# Patient Record
Sex: Male | Born: 1940
Health system: Southern US, Community
[De-identification: ages and names within clinical notes are randomized; demographics above are authoritative.]

## PROBLEM LIST (undated history)

## (undated) DIAGNOSIS — D696 Thrombocytopenia, unspecified: Secondary | ICD-10-CM

## (undated) DIAGNOSIS — D369 Benign neoplasm, unspecified site: Secondary | ICD-10-CM

## (undated) DIAGNOSIS — N183 Chronic kidney disease, stage 3 unspecified: Secondary | ICD-10-CM

## (undated) DIAGNOSIS — G629 Polyneuropathy, unspecified: Secondary | ICD-10-CM

## (undated) DIAGNOSIS — E7800 Pure hypercholesterolemia, unspecified: Secondary | ICD-10-CM

## (undated) DIAGNOSIS — G4730 Sleep apnea, unspecified: Secondary | ICD-10-CM

## (undated) DIAGNOSIS — I442 Atrioventricular block, complete: Secondary | ICD-10-CM

## (undated) DIAGNOSIS — N4340 Spermatocele of epididymis, unspecified: Secondary | ICD-10-CM

## (undated) DIAGNOSIS — M48061 Spinal stenosis, lumbar region without neurogenic claudication: Secondary | ICD-10-CM

## (undated) DIAGNOSIS — I10 Essential (primary) hypertension: Secondary | ICD-10-CM

## (undated) DIAGNOSIS — G4700 Insomnia, unspecified: Secondary | ICD-10-CM

## (undated) DIAGNOSIS — Z950 Presence of cardiac pacemaker: Secondary | ICD-10-CM

## (undated) DIAGNOSIS — I77810 Thoracic aortic ectasia: Secondary | ICD-10-CM

## (undated) DIAGNOSIS — I350 Nonrheumatic aortic (valve) stenosis: Secondary | ICD-10-CM

## (undated) DIAGNOSIS — I480 Paroxysmal atrial fibrillation: Secondary | ICD-10-CM

## (undated) DIAGNOSIS — E119 Type 2 diabetes mellitus without complications: Secondary | ICD-10-CM

## (undated) DIAGNOSIS — I5032 Chronic diastolic (congestive) heart failure: Secondary | ICD-10-CM

## (undated) DIAGNOSIS — I2510 Atherosclerotic heart disease of native coronary artery without angina pectoris: Secondary | ICD-10-CM

## (undated) DIAGNOSIS — G2581 Restless legs syndrome: Secondary | ICD-10-CM

## (undated) HISTORY — PX: CORONARY STENT PLACEMENT: SHX1402

## (undated) HISTORY — DX: Atrioventricular block, complete: I44.2

## (undated) HISTORY — DX: Restless legs syndrome: G25.81

## (undated) HISTORY — DX: Insomnia, unspecified: G47.00

## (undated) HISTORY — DX: Chronic diastolic (congestive) heart failure: I50.32

## (undated) HISTORY — DX: Benign neoplasm, unspecified site: D36.9

## (undated) HISTORY — DX: Spermatocele of epididymis, unspecified: N43.40

## (undated) HISTORY — DX: Pure hypercholesterolemia, unspecified: E78.00

## (undated) HISTORY — PX: HERNIA REPAIR: SHX51

## (undated) HISTORY — DX: Thoracic aortic ectasia: I77.810

## (undated) HISTORY — DX: Gilbert syndrome: E80.4

## (undated) HISTORY — DX: Chronic kidney disease, stage 3 (moderate): N18.3

## (undated) HISTORY — DX: Chronic kidney disease, stage 3 unspecified: N18.30

## (undated) HISTORY — DX: Sleep apnea, unspecified: G47.30

## (undated) HISTORY — DX: Thrombocytopenia, unspecified: D69.6

## (undated) HISTORY — DX: Presence of cardiac pacemaker: Z95.0

## (undated) HISTORY — DX: Type 2 diabetes mellitus without complications: E11.9

## (undated) HISTORY — DX: Nonrheumatic aortic (valve) stenosis: I35.0

## (undated) HISTORY — DX: Spinal stenosis, lumbar region without neurogenic claudication: M48.061

## (undated) HISTORY — DX: Polyneuropathy, unspecified: G62.9

## (undated) HISTORY — DX: Paroxysmal atrial fibrillation: I48.0

---
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
## 1978-05-27 HISTORY — PX: HEMORROIDECTOMY: SUR656

## 1986-05-27 HISTORY — PX: CHOLECYSTECTOMY: SHX55

## 2007-07-09 ENCOUNTER — Encounter: Admission: RE | Admit: 2007-07-09 | Discharge: 2007-07-09 | Payer: Self-pay | Admitting: Geriatric Medicine

## 2008-04-25 ENCOUNTER — Encounter: Admission: RE | Admit: 2008-04-25 | Discharge: 2008-04-25 | Payer: Self-pay | Admitting: Geriatric Medicine

## 2010-06-17 ENCOUNTER — Encounter: Payer: Self-pay | Admitting: Geriatric Medicine

## 2011-03-27 ENCOUNTER — Emergency Department (HOSPITAL_COMMUNITY): Payer: Medicare Other

## 2011-03-27 ENCOUNTER — Inpatient Hospital Stay (HOSPITAL_COMMUNITY)
Admission: EM | Admit: 2011-03-27 | Discharge: 2011-03-28 | DRG: 244 | Disposition: A | Discharge status: Home / Self Care | Payer: Medicare Other | Attending: Interventional Cardiology | Admitting: Interventional Cardiology

## 2011-03-27 DIAGNOSIS — Z79899 Other long term (current) drug therapy: Secondary | ICD-10-CM

## 2011-03-27 DIAGNOSIS — I442 Atrioventricular block, complete: Principal | ICD-10-CM | POA: Diagnosis present

## 2011-03-27 DIAGNOSIS — Z7982 Long term (current) use of aspirin: Secondary | ICD-10-CM

## 2011-03-27 DIAGNOSIS — R7989 Other specified abnormal findings of blood chemistry: Secondary | ICD-10-CM | POA: Diagnosis present

## 2011-03-27 DIAGNOSIS — I2510 Atherosclerotic heart disease of native coronary artery without angina pectoris: Secondary | ICD-10-CM | POA: Diagnosis present

## 2011-03-27 DIAGNOSIS — R0602 Shortness of breath: Secondary | ICD-10-CM

## 2011-03-27 DIAGNOSIS — R42 Dizziness and giddiness: Secondary | ICD-10-CM

## 2011-03-27 DIAGNOSIS — Z9861 Coronary angioplasty status: Secondary | ICD-10-CM

## 2011-03-27 DIAGNOSIS — K219 Gastro-esophageal reflux disease without esophagitis: Secondary | ICD-10-CM | POA: Diagnosis present

## 2011-03-27 DIAGNOSIS — G2581 Restless legs syndrome: Secondary | ICD-10-CM | POA: Diagnosis present

## 2011-03-27 DIAGNOSIS — E785 Hyperlipidemia, unspecified: Secondary | ICD-10-CM | POA: Diagnosis present

## 2011-03-27 LAB — TYPE AND SCREEN
ABO/RH(D): O POS
Antibody Screen: NEGATIVE

## 2011-03-27 LAB — DIFFERENTIAL
Eosinophils Absolute: 0.4 10*3/uL (ref 0.0–0.7)
Eosinophils Relative: 4 % (ref 0–5)
Lymphocytes Relative: 17 % (ref 12–46)
Monocytes Absolute: 0.6 10*3/uL (ref 0.1–1.0)
Neutro Abs: 7.2 10*3/uL (ref 1.7–7.7)

## 2011-03-27 LAB — COMPREHENSIVE METABOLIC PANEL
ALT: 120 U/L — ABNORMAL HIGH (ref 0–53)
Albumin: 3.2 g/dL — ABNORMAL LOW (ref 3.5–5.2)
Chloride: 102 mEq/L (ref 96–112)
Creatinine, Ser: 1.13 mg/dL (ref 0.50–1.35)
GFR calc Af Amer: 73 mL/min — ABNORMAL LOW (ref >90)
GFR calc non Af Amer: 63 mL/min — ABNORMAL LOW (ref >90)
Glucose, Bld: 173 mg/dL — ABNORMAL HIGH (ref 70–99)
Sodium: 138 mEq/L (ref 135–145)
Total Bilirubin: 1.3 mg/dL — ABNORMAL HIGH (ref 0.3–1.2)
Total Protein: 6.2 g/dL (ref 6.0–8.3)

## 2011-03-27 LAB — CK TOTAL AND CKMB (NOT AT ARMC): Relative Index: 3.1 — ABNORMAL HIGH (ref 0.0–2.5)

## 2011-03-27 LAB — MAGNESIUM: Magnesium: 2 mg/dL (ref 1.5–2.5)

## 2011-03-27 LAB — CARDIAC PANEL(CRET KIN+CKTOT+MB+TROPI)
CK, MB: 5.1 ng/mL — ABNORMAL HIGH (ref 0.3–4.0)
Relative Index: 3.8 — ABNORMAL HIGH (ref 0.0–2.5)
Total CK: 143 U/L (ref 7–232)
Total CK: 131 U/L (ref 7–232)
Troponin I: <0.3 ng/mL (ref <0.30)

## 2011-03-27 LAB — CBC
HCT: 41.5 % (ref 39.0–52.0)
WBC: 9.9 10*3/uL (ref 4.0–10.5)

## 2011-03-27 LAB — PRO B NATRIURETIC PEPTIDE: Pro B Natriuretic peptide (BNP): 1067 pg/mL — ABNORMAL HIGH (ref 0–125)

## 2011-03-27 LAB — POCT I-STAT TROPONIN I: Troponin i, poc: 0.04 ng/mL (ref 0.00–0.08)

## 2011-03-27 LAB — PHOSPHORUS: Phosphorus: 2.4 mg/dL (ref 2.3–4.6)

## 2011-03-27 LAB — PROTIME-INR: Prothrombin Time: 14.5 seconds (ref 11.6–15.2)

## 2011-03-28 ENCOUNTER — Inpatient Hospital Stay (HOSPITAL_COMMUNITY): Payer: Medicare Other

## 2011-03-28 LAB — COMPREHENSIVE METABOLIC PANEL
ALT: 119 U/L — ABNORMAL HIGH (ref 0–53)
Calcium: 9.4 mg/dL (ref 8.4–10.5)
Creatinine, Ser: 1.01 mg/dL (ref 0.50–1.35)
GFR calc Af Amer: 84 mL/min — ABNORMAL LOW (ref >90)
GFR calc non Af Amer: 72 mL/min — ABNORMAL LOW (ref >90)
Glucose, Bld: 121 mg/dL — ABNORMAL HIGH (ref 70–99)
Sodium: 140 mEq/L (ref 135–145)
Total Protein: 6.2 g/dL (ref 6.0–8.3)

## 2011-03-28 NOTE — H&P (Signed)
NAMEMARKY, BURESH NO.:  [id number]  MEDICAL RECORD NO.:  [id number]  LOCATION:  [facility]                         FACILITY:  [facility]  PHYSICIAN:  Harlon Flor, MD   DATE OF BIRTH:  [birth year]  DATE OF ADMISSION:  03/27/2011 DATE OF DISCHARGE:                             HISTORY & PHYSICAL   PRIMARY CARDIOLOGIST:  Corky Crafts, MD with [facility].  CHIEF COMPLAINT:  Shortness of breath and dizziness.  HISTORY OF PRESENT ILLNESS:  David [name] is a pleasant 70 year old male with history of remote PCI x2 to his LAD in [place] as well as restless legs syndrome who was brought into the ER via EMS after his wife called. She takes care of him in bed tonight and he began coughing and snoring loudly.  She woke him up and he was somewhat out of it for the next 5 minutes.  She then got him up and walked into the bathroom and he felt dizzy.  At some point, she checked his vital signs and noted that his heart rate was in the 20s.  EMS was called and he was brought to the emergency room, and he was found to be in complete heart block.  On further questioning, it sounds that he has had some episodes of dizziness over the last few weeks, but he has not passed out.  He has not had any chest pain and shortness of breath is resolved.  PAST MEDICAL HISTORY: 1. Coronary artery disease:  PSI x2 in the past in [place], at least 1     stent in his LAD. 2. Restless legs syndrome. 3. Hyperlipidemia. 4. GERD.  ALLERGIES:  RESTORIL and BENADRYL cause an adverse reaction, but an allergy.  HOME MEDICATIONS: 1. Niaspan 2000 mg at bedtime. 2. Metoprolol 50 mg b.i.d. 3. Simvastatin 10 mg daily. 4. Pramipexole 2 mg at bedtime. 5. Famotidine 10 mg daily.  FAMILY HISTORY:  His father had an MI at 51.  SOCIAL HISTORY:  He lives at home with his wife.  He does not smoke tobacco or use alcohol.  REVIEW OF SYSTEMS:  Full review of systems is negative except as stated in  HPI.  PHYSICAL EXAMINATION:  VITAL SIGNS:  Blood pressure 92/55, heart rate 27, respirations 16, and temperature afebrile. GENERAL:  No acute distress. HEENT:  Extraocular movements intact.  Oropharynx benign.  Nonicteric sclerae. NECK:  Supple. CARDIOVASCULAR:  Bradycardic.  No murmurs.  Jugular venous pressure normal. LUNGS:  Basilar crackles bilaterally. ABDOMEN:  Soft, nontender, and nondistended. EXTREMITIES:  No clubbing, cyanosis, or edema. NEURO:  He moves all extremities well.  Follows commands.  Alert and oriented x3. SKIN:  No rashes. LYMPH:  No lymphadenopathy.  EKG shows normal sinus rhythm with complete heart block and the ventricular escape in the 20s.  All of his labs are pending.  Chest x- ray is pending.  ASSESSMENT AND PLAN:  David [name] is a pleasant 70 year old gentleman with history of coronary artery disease who presents to the emergency room with complete heart block with various flow ventricular escape.  His current status is guarded.  He will be going for a temporary pacemaker emergently in a cath lab right  now, after which plans will be made for permanent pacemaker placement.  In the meantime, we will hold his metoprolol and keep him n.p.o.  Basic labs have been ordered, they are currently pending.     Harlon Flor, MD     MMB/MEDQ  D:  03/27/2011  T:  03/27/2011  Job:  [id number]  Electronically Signed by Meridee Score MD on 03/28/2011 10:05:36 PM

## 2011-03-29 NOTE — Discharge Summary (Signed)
  David [name]              ACCOUNT NO.:  [id number]  MEDICAL RECORD NO.:  [id number]  LOCATION:  2908                         FACILITY:  [facility]  PHYSICIAN:  Corky Crafts, MDDATE OF BIRTH:  [birth year]  DATE OF ADMISSION:  03/27/2011 DATE OF DISCHARGE:  03/28/2011                              DISCHARGE SUMMARY   FINAL DIAGNOSES: 1. Complete heart block. 2. Coronary artery disease. 3. Elevated liver function tests.  PROCEDURES PERFORMED: 1. Transvenous temporary pacemaker performed by Dr. Verdis Prime on the     morning of March 27, 2011. 2. Permanent transvenous pacemaker implanted by Dr. Berton Mount on     March 27, 2011, this is a Engineer, water. Jude, Paramedic DR RF T210     pacemaker. 3. Echocardiogram showing normal left ventricular function, done on     March 27, 2011.  HOSPITAL COURSE:  The patient was admitted because of severe shortness of breath.  He was found to be in complete heart block with heart rate of less than 30.  He underwent emergent temporary pacemaker placement. This stabilized his blood pressure and symptoms.  He then had an echocardiogram showing normal LV function.  He ruled out for MI.  He had a permanent pacer placed by Dr. Graciela Husbands.  It was also noted that his liver function tests were somewhat elevated.  It was unclear what the cause of this was.  He is on simvastatin given his coronary artery disease.  He did also have a very slow heart rate, which could have resulted in passive congestion of his liver.  Recheck was pending at the time of discharge.  We will likely recheck his LFTs in the next 4 weeks or so. We will stop his simvastatin at this time as a precaution.  DISCHARGE MEDICATIONS: 1. Aspirin 325 mg daily. 2. Metoprolol 50 mg p.o. b.i.d. 3. Mirapex 2 mg at bedtime. 4. Niaspan 2 g daily. 5. Sublingual nitroglycerin 0.4 mg p.r.n.  He is to stop taking simvastatin 10 mg daily.  Followup appointments with Dr. Graciela Husbands for wound  check in 10-14 days. Their office will call.  Follow up appointment with Dr. Eldridge Dace on April 22, 2011, at 9:45 a.m.  LAB WORK:  At the time of discharge most significantly TSH was 1.1.  AST was 178, ALT 120.  DIET:  Low-sodium, heart-healthy diet.  ACTIVITIES:  He is to follow the pacemaker instructions, particularly regarding not moving his left arm outside of the approved range of motion.     Corky Crafts, MD     JSV/MEDQ  D:  03/28/2011  T:  03/28/2011  Job:  [id number]  Electronically Signed by Lance Muss MD on 03/29/2011 05:36:23 PM

## 2011-03-29 NOTE — Consult Note (Signed)
NAMEJACION, [name] NO.:  [id number]  MEDICAL RECORD NO.:  [id number]  LOCATION:  2908                         FACILITY:  [facility]  PHYSICIAN:  David Salvia, MD, FACCDATE OF BIRTH:  [birth year]  DATE OF CONSULTATION:  03/27/2011 DATE OF DISCHARGE:                                CONSULTATION   Thank you very much for asking [place] to see David [name] in consultation because of complete heart block.  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 Doylene [name] in [place] and underwent a series of PET scans, which subsequently prompted intervention in his LAD.  He has done well.  He has had no problems with chest pain.  His last PET scan was about 5 years ago.  He does not recall an intercurrent stress test.  Over the last month or so, he has noted progressive dyspnea again, that is, he gets short of breath climbing up hills as he walks with his wife.  He denies chest pain.  Yesterday evening, he got on the bike and did his routine.  This was normal.  Last night, when he went to bed apparently, according to his wife, which was reported by his daughter-in-law, he was somewhat short of breath and during the night, he became significantly more short of breath.  She aroused him, she tried to take his pulse.  She is a Engineer, civil (consulting), she was unable to take a pulse.  EMS was contacted, and he was found to be in complete heart block on their arrival.  He was brought to the hospital and was admitted for temporary transvenous pacing.  Initial cardiac enzymes are negative.  Electrolytes are normal, and his relevant medications include metoprolol.  PAST MEDICAL HISTORY:  Largely negative apart from the above.  He does have hyperglycemia.  PAST SURGICAL HISTORY:  Notable for: 1. Cholecystectomy. 2. Hemorrhoidectomy. 3. Bilateral  inguinal herniorrhaphy.  SOCIAL HISTORY:  He is a retired Comptroller.  His son is David [name], the radiologist.  He does not use cigarettes or recreational drugs.  He does use alcohol occasionally.  REVIEW OF SYSTEMS:  Broadly negative apart from what is outlined previously.  ALLERGIES/DRUG INTOLERANCES:  RESTORIL and BENADRYL.  MEDICATIONS:  Metoprolol 50 b.i.d.  He takes an over-the-counter PPI. He is on niacin 2000 at night, simvastatin 10, and aspirin 325.  PHYSICAL EXAMINATION:  GENERAL:  He is an elderly Caucasian male, appearing his stated age, lying flat in bed, in no acute distress. VITAL SIGNS:  His blood pressure is 125/37, his pulse was 80. HEENT:  Normal. NECK:  Neck veins were notable for cannon A waves.  Carotids are brisk and full without bruits.  LUNGS:  Clear bilaterally. CARDIAC:  Heart sounds were regular without murmurs or gallops. ABDOMEN:  Soft with active bowel sounds.  There was no HJR. EXTREMITIES:  Femoral pulses were 2+ bilaterally.  There was no clubbing, cyanosis, or edema. NEUROLOGIC:  Grossly normal. SKIN:  Warm and dry.  Electrocardiogram this morning demonstrated complete heart block with a sinus rate of 65 beats per  minute and a ventricular escape of 27.  There is a right bundle-branch pattern with this.  Initial blood tests are unrevealing.  Hemoglobin was normal, platelet count was a little bit low at 126.  CK-MB was 5.3 with a relative index of 3.1, troponins were negative at point of care.  Metabolic profile demonstrated abnormal LFTs with an SGOT of 178 and SGPT of 120, bilirubin is also elevated at 1.7, creatinine is 1.13 with a GFR of 63. Magnesium was normal.  IMPRESSION: 1. Complete heart block. 2. Progressive dyspnea over the last 1-2 months. 3. Coronary artery disease with prior stenting with thought to be     normal left ventricular function, with antecedent symptoms to that,     also notable for dyspnea. 4.  Gastroesophageal reflux disease. 5. Elevated LFTs.  DISCUSSION:  David [name] has complete heart block which is likely acute in onset given his change in symptoms over night and his functional status yesterday.  This occurs in the context of progressive dyspnea which was previously a marker of ischemia for him.  Risk raises the possibility of progressive coronary artery disease, and this will need to be excluded. We also need to make sure there was not an acute myocardial infarction underlying this event.  The fact that his LFTs were abnormal but his troponins were normal, we would suggest that the former are unrelated to myocardial injury.  The cause of the LFT abnormalities will need to be explored.  Potentially, it was related to simvastatin.  I doubt it is related to acute right-sided congestive heart failure, although I guess this is possible.  The patient will clearly need device implantation unless there is a reversible cause for his heart block.  An ultrasound will be done to look for left ventricular function given his progressive dyspnea.  In the event that severe LV dysfunction is noted, I would recommend catheterization given the fact that dyspnea was an anginal equivalent prior to initial stenting.  In the event that his enzymes were abnormal, he will also need catheterization as the complete heart block might be secondary to an acute myocardial event which would potentially then be reversible.  I reviewed these issues with the patient and the family.  The risks of the procedure will be reviewed later if that is the course we take.  The patient and his daughter-in-law understand.  All their questions have been answered.    David Salvia, MD, [facility]    SCK/MEDQ  D:  03/27/2011  T:  03/27/2011  Job:  [id number]  Electronically Signed by Sherryl Manges MD [facility] on 03/29/2011 06:03:57 PM

## 2011-03-29 NOTE — Op Note (Signed)
  David [name], David [name] NO.:  [id number]  MEDICAL RECORD NO.:  [id number]  LOCATION:  2908                         FACILITY:  [facility]  PHYSICIAN:  Duke Salvia, MD, FACCDATE OF BIRTH:  1940-12-02  DATE OF PROCEDURE:  03/27/2011 DATE OF DISCHARGE:                              OPERATIVE REPORT   PREOPERATIVE DIAGNOSIS:  Complete heart block.  POSTOPERATIVE DIAGNOSIS:  Complete heart block.  PROCEDURE:  Dual-chamber pacemaker implantation.  DESCRIPTION:  Following the obtaining of informed consent, the patient was brought to the electrophysiology laboratory and placed on the fluoroscopic table in supine position.  After routine prep and drape, lidocaine was infiltrated in prepectoral subclavicular region.  An incision was made and carried down to the layer of the prepectoral fascia using electrocautery and sharp dissection.  A pocket was formed similarly.  Hemostasis was obtained.  Thereafter, attention was turned to gain access to the extrathoracic left subclavian vein, which was accomplished without difficulty without the aspiration of air or puncture of the artery.  Two separate venipunctures were accomplished and septally 7-French sheaths were placed through which were passed a St. Jude Tendril 2088 TC active fixation ventricular lead, serial number [id number] and a St. Jude 2088 TC 52 cm length lead, serial number [id number].  Under fluoroscopic guidance, these were manipulated to the right ventricular apex and the right atrial appendage respectively.  With bipolar paced, sensed R-wave was 17 with a pace impedance of 566, a threshold shortly after screw deployment of 1.3 at 0.5 msec, current threshold was 2.5 mA, there was no diaphragmatic pacing at 10 V and the current of injury was moderate. This lead was marked with a tie.  Bipolar P-wave was 3 with a pace impedance of 41, a threshold after screw deployment was 1.9 at 0.5.  Current threshold was  about 3.8 mA. There was no diaphragmatic pacing at 10 V, and the current of injury was brisk.  These leads were secured to the prepectoral fascia and then attached to a St. Jude Accent RF pulse generator, model PM 2210, serial number [id number].  AV pacing was identified.  The temporary pacemaker was removed under fluoroscopic guidance and was left in the IVC.  The pocket was copiously irrigated with antibiotic containing saline solution, and hemostasis was assured.  Leads and the pulse generator were secured to the prepectoral fascia, and the wound was then closed in 3 layers in normal fashion.  The wound was washed, dried, and a benzoin and Steri-Strip dressing was applied. Needle counts, sponge counts, and instrument counts were correct at the end of the procedure according to the staff.  The patient tolerated the procedure without apparent complications.     Duke Salvia, MD, [facility]     SCK/MEDQ  D:  03/27/2011  T:  03/28/2011  Job:  [id number]  Electronically Signed by Sherryl Manges MD [facility] on 03/29/2011 06:04:02 PM

## 2011-03-29 NOTE — Cardiovascular Report (Signed)
  NAMELINDY, David [name] NO.:  [id number]  MEDICAL RECORD NO.:  [id number]  LOCATION:  [facility]                         FACILITY:  [facility]  PHYSICIAN:  Lyn Records, M.D.   DATE OF BIRTH:  December 02, 1940  DATE OF PROCEDURE:  03/27/2011 DATE OF DISCHARGE:                           CARDIAC CATHETERIZATION   INDICATION:  Third-degree heart block with hypotension.  PROCEDURE PERFORMED:  Temporary transvenous pacemaker via right femoral access.  DESCRIPTION:  After 1% Xylocaine, a 6-French sheath was placed.  A 4- French pacing wire was then advanced to the right ventricular apex using fluoroscopic guidance.  The threshold mA was noted to be 0.6.  Settings at completion of the procedure were asynchronous mode, rate 80 beats per minute, and mA 3.0.  CONCLUSIONS:  Successful implantation of transvenous pacemaker lead for the purposes of third-degree heart block with severe bradycardia.  PLAN:  Consider permanent pacemaker insertion.  Discontinue beta-blocker therapy for the time being.  Wean dopamine.     Lyn Records, M.D.     HWS/MEDQ  D:  03/27/2011  T:  03/27/2011  Job:  [id number]  Electronically Signed by Verdis Prime M.D. on 03/29/2011 10:11:30 AM

## 2011-04-15 ENCOUNTER — Ambulatory Visit (INDEPENDENT_AMBULATORY_CARE_PROVIDER_SITE_OTHER): Payer: Medicare Other | Admitting: *Deleted

## 2011-04-15 ENCOUNTER — Encounter: Payer: Self-pay | Admitting: Internal Medicine

## 2011-04-15 DIAGNOSIS — I442 Atrioventricular block, complete: Secondary | ICD-10-CM

## 2011-04-15 LAB — PACEMAKER DEVICE OBSERVATION
AL AMPLITUDE: 3.1 mv
AL THRESHOLD: 0.5 V
BAMS-0001: 150 {beats}/min
BAMS-0003: 70 {beats}/min
BATTERY VOLTAGE: 3.1133 V
DEVICE MODEL PM: 7280216
RV LEAD THRESHOLD: 1 V

## 2011-04-18 ENCOUNTER — Encounter: Payer: Self-pay | Admitting: *Deleted

## 2011-04-18 ENCOUNTER — Emergency Department (HOSPITAL_COMMUNITY)
Admission: EM | Admit: 2011-04-18 | Discharge: 2011-04-18 | Disposition: A | Discharge status: Home / Self Care | Payer: Medicare Other | Attending: Emergency Medicine | Admitting: Emergency Medicine

## 2011-04-18 ENCOUNTER — Other Ambulatory Visit: Payer: Self-pay

## 2011-04-18 ENCOUNTER — Emergency Department (HOSPITAL_COMMUNITY): Payer: Medicare Other

## 2011-04-18 DIAGNOSIS — R1013 Epigastric pain: Secondary | ICD-10-CM | POA: Insufficient documentation

## 2011-04-18 DIAGNOSIS — I10 Essential (primary) hypertension: Secondary | ICD-10-CM | POA: Insufficient documentation

## 2011-04-18 DIAGNOSIS — Z79899 Other long term (current) drug therapy: Secondary | ICD-10-CM | POA: Insufficient documentation

## 2011-04-18 DIAGNOSIS — I2510 Atherosclerotic heart disease of native coronary artery without angina pectoris: Secondary | ICD-10-CM | POA: Insufficient documentation

## 2011-04-18 DIAGNOSIS — R110 Nausea: Secondary | ICD-10-CM | POA: Insufficient documentation

## 2011-04-18 DIAGNOSIS — Z7982 Long term (current) use of aspirin: Secondary | ICD-10-CM | POA: Insufficient documentation

## 2011-04-18 DIAGNOSIS — R079 Chest pain, unspecified: Secondary | ICD-10-CM | POA: Insufficient documentation

## 2011-04-18 HISTORY — DX: Essential (primary) hypertension: I10

## 2011-04-18 HISTORY — DX: Atherosclerotic heart disease of native coronary artery without angina pectoris: I25.10

## 2011-04-18 LAB — BASIC METABOLIC PANEL
CO2: 27 mEq/L (ref 19–32)
Calcium: 9.4 mg/dL (ref 8.4–10.5)
Chloride: 101 mEq/L (ref 96–112)
Creatinine, Ser: 0.92 mg/dL (ref 0.50–1.35)
GFR calc Af Amer: >90 mL/min (ref >90)
Sodium: 137 mEq/L (ref 135–145)

## 2011-04-18 LAB — CBC
MCHC: 34.8 g/dL (ref 30.0–36.0)
Platelets: 168 10*3/uL (ref 150–400)
RDW: 12.4 % (ref 11.5–15.5)
WBC: 7.8 10*3/uL (ref 4.0–10.5)

## 2011-04-18 LAB — DIFFERENTIAL
Basophils Absolute: 0 10*3/uL (ref 0.0–0.1)
Basophils Relative: 0 % (ref 0–1)
Lymphocytes Relative: 21 % (ref 12–46)
Monocytes Absolute: 0.5 10*3/uL (ref 0.1–1.0)
Neutro Abs: 5.4 10*3/uL (ref 1.7–7.7)
Neutrophils Relative %: 69 % (ref 43–77)

## 2011-04-18 LAB — CARDIAC PANEL(CRET KIN+CKTOT+MB+TROPI)
CK, MB: 4.8 ng/mL — ABNORMAL HIGH (ref 0.3–4.0)
Relative Index: 3.6 — ABNORMAL HIGH (ref 0.0–2.5)
Total CK: 133 U/L (ref 7–232)

## 2011-04-18 MED ORDER — SODIUM CHLORIDE 0.9 % IV BOLUS (SEPSIS)
500.0000 mL | Freq: Once | INTRAVENOUS | Status: AC
  Administered 2011-04-18: 500 mL via INTRAVENOUS

## 2011-04-18 MED ORDER — GI COCKTAIL ~~LOC~~
30.0000 mL | Freq: Once | ORAL | Status: AC
  Administered 2011-04-18: 30 mL via ORAL
  Filled 2011-04-18: qty 30

## 2011-04-18 NOTE — ED Provider Notes (Signed)
Medical screening examination/treatment/procedure(s) were conducted as a shared visit with non-physician practitioner(s) and myself.  I personally evaluated the patient during the encounter  Doug Sou, MD 04/18/11 1600

## 2011-04-18 NOTE — Consults (Signed)
Admit date: 04/18/2011 Referring Physician  Dr. Sherald Barge ER Primary Physician  Dr. Pete Glatter Primary Cardiologist:  Catalina Gravel, MD Reason for Consultation  Chest pressure  ASSESSMENT: !. Prolonged recurrent chest pressure and pallor, lasting up to 30 mins. 2. CASHD with overlapping LAD BMS 1998 3. Hypertension 4. High grade AV block treated with DDD pacer 02/2011 5.H/O Esophageal reflux  PLAN: 1. Admit to evaluate progressive new onset angina. Pt. refuses admission and prefers to go home and consider going back to [facility] to see Dr. Emily Filbert for myocardial PET imaging. 2. Advised to use sl NTG if recurrent chest pain and advised to return to the ER. 3. Keep appointment with Dr. Eldridge Dace on Monday 11/226/12   HPI: The patient gives a 3-7 day history of recurrent left precordial chest pressure occurring spontaneously. The discomfort is associated with pallor according to the wife. He used 3 sl NTG to to get relief after approximately 30 minutes. Also received GI cocktail in ER. He is now pain free and believes the pain was GI or musculoskeletal despite my contention to the contrary.   PMH:   Past Medical History  Diagnosis Date  . Coronary artery disease   . Hypertension      PSH:   Past Surgical History  Procedure Date  . Coronary stent placement   . Pacermaker   . Hernia repair   . Cholecystectomy   . Appendectomy     Allergies:  Benadryl Prior to Admit Meds:   (Not in a hospital admission) Fam HX:   No family history on file. Social HX:    History   Social History  . Marital Status: Married    Spouse Name: N/A    Number of Children: N/A  . Years of Education: N/A   Occupational History  . Not on file.   Social History Main Topics  . Smoking status: Former Games developer  . Smokeless tobacco: Not on file  . Alcohol Use: Yes     occ  . Drug Use: No  . Sexually Active:    Other Topics Concern  . Not on file   Social History Narrative  . No narrative on  file     Review of Systems: No significant other ROS issues other than as noted above.  Physical Exam: Blood pressure 115/58, pulse 60, temperature 97.5 F (36.4 C), temperature source Oral, resp. rate 19, SpO2 100.00%. Weight change:   On exam, his color is good. There is an S4 gallop. There is a faint mid systolic click/squeek and 2/6 systolic murmur.The abdomen is soft. No bruit is heard. There is no edema. There are no neurological complaints. Labs:   Lab Results  Component Value Date   WBC 7.8 04/18/2011   HGB 14.7 04/18/2011   HCT 42.3 04/18/2011   MCV 95.7 04/18/2011   PLT 168 04/18/2011    Lab 04/18/11 1041  NA 137  K 4.1  CL 101  CO2 27  BUN 19  CREATININE 0.92  CALCIUM 9.4  PROT --  BILITOT --  ALKPHOS --  ALT --  AST --  GLUCOSE 133*   No results found for this basename: PTT   Lab Results  Component Value Date   INR 1.11 03/27/2011   Lab Results  Component Value Date   CKTOTAL 133 04/18/2011   CKMB 4.8* 04/18/2011   TROPONINI <0.30 04/18/2011     No results found for this basename: CHOL   No results found for this basename: HDL  No results found for this basename: LDLCALC   No results found for this basename: TRIG   No results found for this basename: CHOLHDL   No results found for this basename: LDLDIRECT      Radiology:  Dg Chest 2 View  04/18/2011  *RADIOLOGY REPORT*  Clinical Data: Left chest pain.  Recent pacemaker placement.  CHEST - 2 VIEW  Comparison: 03/28/2011  Findings: Dual lead transvenous pacemaker remains in appropriate position.  Heart size is normal.  No evidence of pneumothorax or pleural effusion.  Both lungs are clear.  No evidence of pulmonary infiltrate or edema.  No mass or lymphadenopathy identified.  IMPRESSION: Stable exam.  No active disease.  Original Report Authenticated By: Danae Orleans, M.D.   EKG:  AV sequential pacing.    Lesleigh Noe 04/18/2011 2:23 PM

## 2011-04-18 NOTE — ED Provider Notes (Addendum)
History     CSN: [id number] Arrival date & time: 04/18/2011 10:20 AM   First MD Initiated Contact with Patient 04/18/11 1021      Chief Complaint  Patient presents with  . Chest Pain    (Consider location/radiation/quality/duration/timing/severity/associated sxs/prior treatment) HPI  Patient has history of pacemaker and history of stent placement who is currently followed by Dr. Eldridge Dace presents to emergency department complaining of a one week history of intermittent anterior lower chest pain/epigastric discomfort that he describes as a heaviness and overall not feeling well. Patient states that yesterday he generally did not feel well but early this morning acute onset heaviness once again an anterior chest. Patient denies history of angina stating that coronary artery disease was found with abnormal EKG, stress test, and then cardiac catheterization. Patient states he felt nauseous but denies vomiting. He's been eating and drinking per normal. Patient denies radiation of pain. He took 2 nitroglycerin glycerin prior to arrival without immediate relief of pain however currently states symptoms have resolved. He also took his daily 325 mg of aspirin prior to arrival. Past Medical History  Diagnosis Date  . Coronary artery disease   . Hypertension     Past Surgical History  Procedure Date  . Coronary stent placement   . Pacermaker   . Hernia repair   . Cholecystectomy   . Appendectomy     No family history on file.  History  Substance Use Topics  . Smoking status: Former Games developer  . Smokeless tobacco: Not on file  . Alcohol Use: Yes     occ      Review of Systems  All other systems reviewed and are negative.    Allergies  Benadryl  Home Medications   Current Outpatient Rx  Name Route Sig Dispense Refill  . ASPIRIN EC 325 MG PO TBEC Oral Take 325 mg by mouth daily.      [name] FAMOTIDINE 10 MG PO TABS Oral Take 10 mg by mouth daily.      [name] METOPROLOL TARTRATE 50 MG  PO TABS Oral Take 50 mg by mouth 2 (two) times daily.      [name] NIACIN (ANTIHYPERLIPIDEMIC) 1000 MG PO TBCR Oral Take 2,000 mg by mouth at bedtime.     [name] NITROGLYCERIN 0.4 MG SL SUBL Sublingual Place 0.4 mg under the tongue every 5 (five) minutes as needed. For chest pain      . OMEPRAZOLE 20 MG PO CPDR Oral Take 20 mg by mouth daily as needed. For acid reflux     . PRAMIPEXOLE DIHYDROCHLORIDE 1 MG PO TABS Oral Take 2 mg by mouth at bedtime.      [name] SIMVASTATIN 10 MG PO TABS Oral Take 10 mg by mouth daily.        BP 126/59  Pulse 60  Temp(Src) 97.4 F (36.3 C) (Oral)  Resp 18  SpO2 100%  Physical Exam  Nursing note and vitals reviewed. Constitutional: He is oriented to person, place, and time. He appears well-developed and well-nourished. No distress.  HENT:  Head: Normocephalic and atraumatic.  Eyes: Conjunctivae and EOM are normal. Pupils are equal, round, and reactive to light.  Neck: Normal range of motion. Neck supple.  Cardiovascular: Normal rate, regular rhythm, normal heart sounds and intact distal pulses.  Exam reveals no gallop and no friction rub.   No murmur heard. Pulmonary/Chest: Effort normal and breath sounds normal. No respiratory distress. He has no wheezes. He has no rales. He exhibits no tenderness.  Abdominal: Bowel sounds are normal. He exhibits no distension and no mass. There is no tenderness. There is no rebound and no guarding.  Musculoskeletal: Normal range of motion. He exhibits no edema and no tenderness.  Neurological: He is alert and oriented to person, place, and time.  Skin: Skin is warm and dry. No rash noted. He is not diaphoretic. No erythema.  Psychiatric: He has a normal mood and affect.    Date: 04/18/2011  Rate: 60  Rhythm: AV duel paced rhythm  QRS Axis: indeterminate  Intervals: normal  ST/T Wave abnormalities: normal  Conduction Disutrbances:none  Narrative Interpretation:   Old EKG Reviewed: unchanged   ED Course  Procedures  (including critical care time)  IV fluids.   Labs Reviewed  BASIC METABOLIC PANEL - Abnormal; Notable for the following:    Glucose, Bld 133 (*)    GFR calc non Af Amer 82 (*)    All other components within normal limits  CARDIAC PANEL(CRET KIN+CKTOT+MB+TROPI) - Abnormal; Notable for the following:    CK, MB 4.8 (*)    Relative Index 3.6 (*)    All other components within normal limits  CBC  DIFFERENTIAL   Dg Chest 2 View  04/18/2011  *RADIOLOGY REPORT*  Clinical Data: Left chest pain.  Recent pacemaker placement.  CHEST - 2 VIEW  Comparison: 03/28/2011  Findings: Dual lead transvenous pacemaker remains in appropriate position.  Heart size is normal.  No evidence of pneumothorax or pleural effusion.  Both lungs are clear.  No evidence of pulmonary infiltrate or edema.  No mass or lymphadenopathy identified.  IMPRESSION: Stable exam.  No active disease.  Original Report Authenticated By: Danae Orleans, M.D.     1. Chest pain       MDM  Patient with hx of CAD but no acute findings on EKG or cardiac markers to be evaluated further by cardiology.         Jenness Corner, PA 04/18/11 [address] [place], [place] 04/18/11 1411

## 2011-04-18 NOTE — ED Provider Notes (Signed)
Complains of anterior chest/abdominal discomfort onset a week ago intermittent nonexertional not made worse or better food . No immediate relief after nitroglycerin. Presently asymptomatic. Patient has history of pacemaker and coronary stents. On exam no distress lungs clear to auscultation heart regular rate and rhythm.  Doug Sou, MD 04/18/11 1319

## 2011-04-18 NOTE — ED Notes (Signed)
Pt is here in room after having ekg done.  Pt is here with complaints of bad feeling, heaviness to left chest that started last weekend, then not feeling well yesterday.  Pt usually takes pepcid routinely for reflux.  Pt got pale and not feeling well.  bp 162/80 at home.  Pt has a pacer and is having paced rhythm per ekg.  Pt has hx of stents.  Exertion does not increase pain and the discomfort is intermittent.  Pt sts no discomfort now.  Pt has had 2 ntg prior to arrival and had 324mg  of asa.

## 2011-04-18 NOTE — ED Provider Notes (Signed)
Medical screening examination/treatment/procedure(s) were conducted as a shared visit with non-physician practitioner(s) and myself.  I personally evaluated the patient during the encounter  Doug Sou, MD 04/18/11 1318

## 2011-07-10 DIAGNOSIS — Z79899 Other long term (current) drug therapy: Secondary | ICD-10-CM | POA: Diagnosis not present

## 2011-07-10 DIAGNOSIS — D7589 Other specified diseases of blood and blood-forming organs: Secondary | ICD-10-CM | POA: Diagnosis not present

## 2011-07-10 DIAGNOSIS — E7800 Pure hypercholesterolemia, unspecified: Secondary | ICD-10-CM | POA: Diagnosis not present

## 2011-07-16 ENCOUNTER — Ambulatory Visit (INDEPENDENT_AMBULATORY_CARE_PROVIDER_SITE_OTHER): Payer: Medicare Other | Admitting: Internal Medicine

## 2011-07-16 ENCOUNTER — Encounter: Payer: Self-pay | Admitting: Internal Medicine

## 2011-07-16 DIAGNOSIS — I442 Atrioventricular block, complete: Secondary | ICD-10-CM | POA: Diagnosis not present

## 2011-07-16 DIAGNOSIS — Z950 Presence of cardiac pacemaker: Secondary | ICD-10-CM | POA: Insufficient documentation

## 2011-07-16 HISTORY — DX: Presence of cardiac pacemaker: Z95.0

## 2011-07-16 HISTORY — PX: OTHER SURGICAL HISTORY: SHX169

## 2011-07-16 LAB — PACEMAKER DEVICE OBSERVATION
AL IMPEDENCE PM: 487.5 Ohm
ATRIAL PACING PM: 92
BAMS-0003: 70 {beats}/min
BATTERY VOLTAGE: 2.9478 V
RV LEAD IMPEDENCE PM: 487.5 Ohm

## 2011-07-16 NOTE — Patient Instructions (Signed)
Your physician recommends that you continue on your current medications as directed. Please refer to the Current Medication list given to you today.  Continue to follow up with Dr. Isabel Caprice.

## 2011-07-16 NOTE — Assessment & Plan Note (Signed)
The patient's device was interrogated and the information was fully reviewed.  The device was reprogrammed to maiximize longevity  

## 2011-07-16 NOTE — Progress Notes (Signed)
  HPI  David [name] is a 71 y.o. male Seen following pacemaker implantation for CHB undertaken 10/12 (stJ)  He also has CAD with prior stenting followed closely in [place]  He has normal LV function  The patient denies chest pain, shortness of breath, nocturnal dyspnea, orthopnea or peripheral edema.  There have been no palpitations, lightheadedness or syncope.    Past Medical History  Diagnosis Date  . Coronary artery disease   . Hypertension     Past Surgical History  Procedure Date  . Coronary stent placement   . Pacermaker   . Hernia repair   . Cholecystectomy   . Appendectomy     Current Outpatient Prescriptions  Medication Sig Dispense Refill  . aspirin EC 325 MG tablet Take 325 mg by mouth daily.        . famotidine (PEPCID) 10 MG tablet Take 10 mg by mouth daily.        . metoprolol (LOPRESSOR) 50 MG tablet Take 50 mg by mouth 2 (two) times daily.        . niacin (NIASPAN) 1000 MG CR tablet Take 2,000 mg by mouth at bedtime.       . nitroGLYCERIN (NITROSTAT) 0.4 MG SL tablet Place 0.4 mg under the tongue every 5 (five) minutes as needed. For chest pain        . omeprazole (PRILOSEC) 20 MG capsule Take 20 mg by mouth daily as needed. For acid reflux       . pramipexole (MIRAPEX) 1 MG tablet Take 2 mg by mouth at bedtime.        . simvastatin (ZOCOR) 10 MG tablet Take 10 mg by mouth daily.          Allergies  Allergen Reactions  . Benadryl (Altaryl) Other (See Comments)    Adverse reaction, has very strange behavior.    Review of Systems negative except from HPI and PMH  Physical Exam There were no vitals taken for this visit. Well developed and well nourished in no acute distress HENT normal E scleral and icterus clear Neck Supple Pocket well healed Clear to ausculati Regular rate and rhythm, no murmurs gallops or rub Soft with active bowel sounds No clubbing cyanosis none Edema Alert and oriented, grossly normal motor and sensory function Skin Warm  and Dry   Assessment and  Plan

## 2011-07-16 NOTE — Assessment & Plan Note (Signed)
stable °

## 2011-08-26 DIAGNOSIS — H259 Unspecified age-related cataract: Secondary | ICD-10-CM | POA: Diagnosis not present

## 2011-08-26 DIAGNOSIS — H52209 Unspecified astigmatism, unspecified eye: Secondary | ICD-10-CM | POA: Diagnosis not present

## 2011-08-26 DIAGNOSIS — H43819 Vitreous degeneration, unspecified eye: Secondary | ICD-10-CM | POA: Diagnosis not present

## 2011-08-26 DIAGNOSIS — H3530 Unspecified macular degeneration: Secondary | ICD-10-CM | POA: Diagnosis not present

## 2011-09-10 DIAGNOSIS — H25019 Cortical age-related cataract, unspecified eye: Secondary | ICD-10-CM | POA: Diagnosis not present

## 2011-09-10 DIAGNOSIS — H5200 Hypermetropia, unspecified eye: Secondary | ICD-10-CM | POA: Diagnosis not present

## 2011-09-10 DIAGNOSIS — H2510 Age-related nuclear cataract, unspecified eye: Secondary | ICD-10-CM | POA: Diagnosis not present

## 2011-09-10 DIAGNOSIS — H269 Unspecified cataract: Secondary | ICD-10-CM | POA: Diagnosis not present

## 2011-09-13 DIAGNOSIS — L080 Pyoderma: Secondary | ICD-10-CM | POA: Diagnosis not present

## 2011-09-13 DIAGNOSIS — A499 Bacterial infection, unspecified: Secondary | ICD-10-CM | POA: Diagnosis not present

## 2011-09-13 DIAGNOSIS — L570 Actinic keratosis: Secondary | ICD-10-CM | POA: Diagnosis not present

## 2011-09-13 DIAGNOSIS — D235 Other benign neoplasm of skin of trunk: Secondary | ICD-10-CM | POA: Diagnosis not present

## 2011-09-13 DIAGNOSIS — L280 Lichen simplex chronicus: Secondary | ICD-10-CM | POA: Diagnosis not present

## 2011-09-17 DIAGNOSIS — H5210 Myopia, unspecified eye: Secondary | ICD-10-CM | POA: Diagnosis not present

## 2011-09-17 DIAGNOSIS — H269 Unspecified cataract: Secondary | ICD-10-CM | POA: Diagnosis not present

## 2011-09-17 DIAGNOSIS — H25019 Cortical age-related cataract, unspecified eye: Secondary | ICD-10-CM | POA: Diagnosis not present

## 2011-09-17 DIAGNOSIS — H2510 Age-related nuclear cataract, unspecified eye: Secondary | ICD-10-CM | POA: Diagnosis not present

## 2011-11-06 DIAGNOSIS — Z79899 Other long term (current) drug therapy: Secondary | ICD-10-CM | POA: Diagnosis not present

## 2011-11-06 DIAGNOSIS — I2510 Atherosclerotic heart disease of native coronary artery without angina pectoris: Secondary | ICD-10-CM | POA: Diagnosis not present

## 2011-11-06 DIAGNOSIS — E7800 Pure hypercholesterolemia, unspecified: Secondary | ICD-10-CM | POA: Diagnosis not present

## 2011-11-06 DIAGNOSIS — Z23 Encounter for immunization: Secondary | ICD-10-CM | POA: Diagnosis not present

## 2011-11-06 DIAGNOSIS — I10 Essential (primary) hypertension: Secondary | ICD-10-CM | POA: Diagnosis not present

## 2011-12-27 DIAGNOSIS — Z950 Presence of cardiac pacemaker: Secondary | ICD-10-CM | POA: Diagnosis not present

## 2012-04-06 DIAGNOSIS — Z950 Presence of cardiac pacemaker: Secondary | ICD-10-CM | POA: Diagnosis not present

## 2012-04-22 DIAGNOSIS — Z23 Encounter for immunization: Secondary | ICD-10-CM | POA: Diagnosis not present

## 2012-05-07 ENCOUNTER — Other Ambulatory Visit: Payer: Self-pay | Admitting: Geriatric Medicine

## 2012-05-07 DIAGNOSIS — Z79899 Other long term (current) drug therapy: Secondary | ICD-10-CM | POA: Diagnosis not present

## 2012-05-07 DIAGNOSIS — I77819 Aortic ectasia, unspecified site: Secondary | ICD-10-CM

## 2012-05-07 DIAGNOSIS — Z0000 Encounter for general adult medical examination without abnormal findings: Secondary | ICD-10-CM | POA: Diagnosis not present

## 2012-05-07 DIAGNOSIS — E7800 Pure hypercholesterolemia, unspecified: Secondary | ICD-10-CM | POA: Diagnosis not present

## 2012-05-07 DIAGNOSIS — Z1331 Encounter for screening for depression: Secondary | ICD-10-CM | POA: Diagnosis not present

## 2012-05-07 DIAGNOSIS — I10 Essential (primary) hypertension: Secondary | ICD-10-CM | POA: Diagnosis not present

## 2012-05-11 DIAGNOSIS — Z79899 Other long term (current) drug therapy: Secondary | ICD-10-CM | POA: Diagnosis not present

## 2012-05-11 DIAGNOSIS — I10 Essential (primary) hypertension: Secondary | ICD-10-CM | POA: Diagnosis not present

## 2012-05-11 DIAGNOSIS — E7800 Pure hypercholesterolemia, unspecified: Secondary | ICD-10-CM | POA: Diagnosis not present

## 2012-06-08 ENCOUNTER — Other Ambulatory Visit: Payer: Medicare Other

## 2012-07-28 DIAGNOSIS — Z950 Presence of cardiac pacemaker: Secondary | ICD-10-CM | POA: Diagnosis not present

## 2012-07-28 DIAGNOSIS — I2510 Atherosclerotic heart disease of native coronary artery without angina pectoris: Secondary | ICD-10-CM | POA: Diagnosis not present

## 2012-08-03 DIAGNOSIS — Z961 Presence of intraocular lens: Secondary | ICD-10-CM | POA: Diagnosis not present

## 2012-08-03 DIAGNOSIS — H52209 Unspecified astigmatism, unspecified eye: Secondary | ICD-10-CM | POA: Diagnosis not present

## 2012-08-03 DIAGNOSIS — H2640 Unspecified secondary cataract: Secondary | ICD-10-CM | POA: Diagnosis not present

## 2012-08-18 DIAGNOSIS — E782 Mixed hyperlipidemia: Secondary | ICD-10-CM | POA: Diagnosis not present

## 2012-08-18 DIAGNOSIS — I2510 Atherosclerotic heart disease of native coronary artery without angina pectoris: Secondary | ICD-10-CM | POA: Diagnosis not present

## 2012-08-18 DIAGNOSIS — Z950 Presence of cardiac pacemaker: Secondary | ICD-10-CM | POA: Diagnosis not present

## 2012-08-18 DIAGNOSIS — I712 Thoracic aortic aneurysm, without rupture: Secondary | ICD-10-CM | POA: Diagnosis not present

## 2012-09-24 DIAGNOSIS — D369 Benign neoplasm, unspecified site: Secondary | ICD-10-CM

## 2012-09-24 HISTORY — DX: Benign neoplasm, unspecified site: D36.9

## 2012-10-09 DIAGNOSIS — D126 Benign neoplasm of colon, unspecified: Secondary | ICD-10-CM | POA: Diagnosis not present

## 2012-10-09 DIAGNOSIS — Z09 Encounter for follow-up examination after completed treatment for conditions other than malignant neoplasm: Secondary | ICD-10-CM | POA: Diagnosis not present

## 2012-10-09 DIAGNOSIS — Z86010 Personal history of colonic polyps: Secondary | ICD-10-CM | POA: Diagnosis not present

## 2012-11-04 DIAGNOSIS — L570 Actinic keratosis: Secondary | ICD-10-CM | POA: Diagnosis not present

## 2012-11-05 DIAGNOSIS — I10 Essential (primary) hypertension: Secondary | ICD-10-CM | POA: Diagnosis not present

## 2012-11-05 DIAGNOSIS — N529 Male erectile dysfunction, unspecified: Secondary | ICD-10-CM | POA: Diagnosis not present

## 2012-11-05 DIAGNOSIS — E7800 Pure hypercholesterolemia, unspecified: Secondary | ICD-10-CM | POA: Diagnosis not present

## 2012-11-05 DIAGNOSIS — M25519 Pain in unspecified shoulder: Secondary | ICD-10-CM | POA: Diagnosis not present

## 2012-11-05 DIAGNOSIS — Z79899 Other long term (current) drug therapy: Secondary | ICD-10-CM | POA: Diagnosis not present

## 2012-11-09 DIAGNOSIS — Z950 Presence of cardiac pacemaker: Secondary | ICD-10-CM | POA: Diagnosis not present

## 2012-12-11 DIAGNOSIS — M19019 Primary osteoarthritis, unspecified shoulder: Secondary | ICD-10-CM | POA: Diagnosis not present

## 2013-03-03 ENCOUNTER — Encounter (INDEPENDENT_AMBULATORY_CARE_PROVIDER_SITE_OTHER): Payer: Self-pay

## 2013-03-03 ENCOUNTER — Ambulatory Visit (INDEPENDENT_AMBULATORY_CARE_PROVIDER_SITE_OTHER): Payer: Medicare Other | Admitting: *Deleted

## 2013-03-03 DIAGNOSIS — I442 Atrioventricular block, complete: Secondary | ICD-10-CM

## 2013-03-03 LAB — PACEMAKER DEVICE OBSERVATION
AL THRESHOLD: 0.5 V
ATRIAL PACING PM: 93
BAMS-0001: 150 {beats}/min
BAMS-0003: 70 {beats}/min
RV LEAD THRESHOLD: 1.375 V

## 2013-03-03 NOTE — Progress Notes (Signed)
Pacemaker check in clinic. Normal device function. Thresholds, sensing, impedances consistent with previous measurements. Device programmed to maximize longevity.781 mode switches, <1% shows PAC's.  No high ventricular rates noted.  PMT noted, PVARP reprogrammed 356ms->325msec.   Device programmed at appropriate safety margins. Histogram distribution appropriate for patient activity level. Device programmed to optimize intrinsic conduction. Estimated longevity 6 years. Patient enrolled in remote follow-up/TTM's with [employer]. Plan to follow every 3 months remotely and see annually in office. Patient education completed.  ROV 3 months with Dr. Ladona Ridgel.

## 2013-03-17 ENCOUNTER — Encounter: Payer: Self-pay | Admitting: Internal Medicine

## 2013-04-01 DIAGNOSIS — Z23 Encounter for immunization: Secondary | ICD-10-CM | POA: Diagnosis not present

## 2013-05-10 DIAGNOSIS — I10 Essential (primary) hypertension: Secondary | ICD-10-CM | POA: Diagnosis not present

## 2013-05-10 DIAGNOSIS — Z0000 Encounter for general adult medical examination without abnormal findings: Secondary | ICD-10-CM | POA: Diagnosis not present

## 2013-05-10 DIAGNOSIS — Z1331 Encounter for screening for depression: Secondary | ICD-10-CM | POA: Diagnosis not present

## 2013-05-10 DIAGNOSIS — Z79899 Other long term (current) drug therapy: Secondary | ICD-10-CM | POA: Diagnosis not present

## 2013-05-10 DIAGNOSIS — E7800 Pure hypercholesterolemia, unspecified: Secondary | ICD-10-CM | POA: Diagnosis not present

## 2013-05-11 DIAGNOSIS — E7800 Pure hypercholesterolemia, unspecified: Secondary | ICD-10-CM | POA: Diagnosis not present

## 2013-05-11 DIAGNOSIS — Z79899 Other long term (current) drug therapy: Secondary | ICD-10-CM | POA: Diagnosis not present

## 2013-05-11 DIAGNOSIS — I10 Essential (primary) hypertension: Secondary | ICD-10-CM | POA: Diagnosis not present

## 2013-06-08 ENCOUNTER — Encounter: Payer: Self-pay | Admitting: *Deleted

## 2013-06-22 ENCOUNTER — Encounter: Payer: Medicare Other | Admitting: Internal Medicine

## 2013-06-30 ENCOUNTER — Encounter: Payer: Medicare Other | Admitting: Internal Medicine

## 2013-07-09 ENCOUNTER — Encounter: Payer: Self-pay | Admitting: Internal Medicine

## 2013-07-09 ENCOUNTER — Ambulatory Visit (INDEPENDENT_AMBULATORY_CARE_PROVIDER_SITE_OTHER): Payer: Medicare Other | Admitting: Internal Medicine

## 2013-07-09 VITALS — BP 126/78 | HR 60 | Ht 72.5 in | Wt 190.0 lb

## 2013-07-09 DIAGNOSIS — Z950 Presence of cardiac pacemaker: Secondary | ICD-10-CM

## 2013-07-09 DIAGNOSIS — E785 Hyperlipidemia, unspecified: Secondary | ICD-10-CM | POA: Diagnosis not present

## 2013-07-09 DIAGNOSIS — I442 Atrioventricular block, complete: Secondary | ICD-10-CM | POA: Diagnosis not present

## 2013-07-09 LAB — MDC_IDC_ENUM_SESS_TYPE_INCLINIC
Date Time Interrogation Session: 20150213165213
Lead Channel Impedance Value: 400 Ohm
Lead Channel Impedance Value: 462.5 Ohm
Lead Channel Pacing Threshold Pulse Width: 0.5 ms
Lead Channel Pacing Threshold Pulse Width: 0.5 ms
Lead Channel Sensing Intrinsic Amplitude: 1.8 mV
Lead Channel Sensing Intrinsic Amplitude: 12 mV
Lead Channel Setting Pacing Amplitude: 1.375
Lead Channel Setting Pacing Pulse Width: 0.5 ms
MDC IDC MSMT BATTERY REMAINING LONGEVITY: 70.8 mo
MDC IDC MSMT BATTERY VOLTAGE: 2.93 V
MDC IDC MSMT LEADCHNL RA PACING THRESHOLD AMPLITUDE: 0.5 V
MDC IDC MSMT LEADCHNL RV PACING THRESHOLD AMPLITUDE: 1.125 V
MDC IDC PG SERIAL: 7280216
MDC IDC SET LEADCHNL RA PACING AMPLITUDE: 1.5 V
MDC IDC SET LEADCHNL RV SENSING SENSITIVITY: 4 mV
MDC IDC STAT BRADY RA PERCENT PACED: 94 %
MDC IDC STAT BRADY RV PERCENT PACED: 99.99 %

## 2013-07-09 NOTE — Patient Instructions (Signed)

## 2013-07-09 NOTE — Assessment & Plan Note (Signed)
He will continue his statin drugs and niacin. A low cholesterol diet is recommended.

## 2013-07-09 NOTE — Progress Notes (Signed)
HPI David [name] is referred today for evaluation and management of his DDD PM by Dr. Irish Lack. He has a h/o complete heart block and underwent PPM insertion aproximately 2.5 years ago. He has done well. No syncope. He denies any CHF symptoms. No peripheral edema. He drives a motorcycle. He remains acitve, exercising on a regular basis. Allergies  Allergen Reactions  . Benadryl [Diphenhydramine Hcl] Other (See Comments)    Adverse reaction, has very strange behavior.     Current Outpatient Prescriptions  Medication Sig Dispense Refill  . aspirin EC 325 MG tablet Take 325 mg by mouth daily.        . cholecalciferol (VITAMIN D) 1000 UNITS tablet Take 1,000 Units by mouth daily.      . Cyanocobalamin (VITAMIN B-12) 2500 MCG SUBL Place 1 tablet under the tongue daily.      . famotidine (PEPCID) 10 MG tablet Take 10 mg by mouth 2 (two) times daily.       . metoprolol (LOPRESSOR) 50 MG tablet Take 25 mg by mouth 2 (two) times daily.       . Multiple Vitamin (MULTIVITAMIN) capsule TAKE 1 CAPSULE THREE TIMES A WEEK      . niacin (NIASPAN) 1000 MG CR tablet Take 2,000 mg by mouth at bedtime.       . nitroGLYCERIN (NITROSTAT) 0.4 MG SL tablet Place 0.4 mg under the tongue every 5 (five) minutes as needed. For chest pain        . omeprazole (PRILOSEC) 20 MG capsule Take 20 mg by mouth daily as needed. For acid reflux       . pramipexole (MIRAPEX) 1 MG tablet Take 2 mg by mouth at bedtime.        . simvastatin (ZOCOR) 10 MG tablet Take 10 mg by mouth daily.        . vitamin C (ASCORBIC ACID) 500 MG tablet Take 500 mg by mouth daily.       No current facility-administered medications for this visit.     Past Medical History  Diagnosis Date  . Coronary artery disease   . Hypertension   . Pacemaker-St Judes 07/16/2011    ROS:   All systems reviewed and negative except as noted in the HPI.   Past Surgical History  Procedure Laterality Date  . Coronary stent placement    . Pacermaker   07/16/11  . Hernia repair    . Cholecystectomy    . Appendectomy       No family history on file.   History   Social History  . Marital Status: Married    Spouse Name: N/A    Number of Children: N/A  . Years of Education: N/A   Occupational History  . Not on file.   Social History Main Topics  . Smoking status: Former Research scientist (life sciences)  . Smokeless tobacco: Not on file  . Alcohol Use: Yes     Comment: occ  . Drug Use: No  . Sexual Activity:    Other Topics Concern  . Not on file   Social History Narrative  . No narrative on file     BP 126/78  Pulse 60  Ht 6' 0.5" (1.842 m)  Wt 190 lb (86.183 kg)  BMI 25.40 kg/m2  Physical Exam:  Well appearing 73 yo man, NAD HEENT: Unremarkable Neck:  No JVD, no thyromegally Back:  No CVA tenderness Lungs:  Clear with no wheezes, rales, or rhonchi, well healed PPM incision.  HEART:  Regular rate rhythm, no murmurs, no rubs, no clicks Abd:  soft, positive bowel sounds, no organomegally, no rebound, no guarding Ext:  2 plus pulses, no edema, no cyanosis, no clubbing Skin:  No rashes no nodules Neuro:  CN II through XII intact, motor grossly intact   DEVICE  Normal device function.  See PaceArt for details.   Assess/Plan:

## 2013-07-09 NOTE — Assessment & Plan Note (Signed)
His St[employer] Jude DDD PPM is working normally. Will plan to recheck in several months.

## 2013-07-12 DIAGNOSIS — Z79899 Other long term (current) drug therapy: Secondary | ICD-10-CM | POA: Diagnosis not present

## 2013-07-12 DIAGNOSIS — I2510 Atherosclerotic heart disease of native coronary artery without angina pectoris: Secondary | ICD-10-CM | POA: Diagnosis not present

## 2013-07-12 DIAGNOSIS — E7800 Pure hypercholesterolemia, unspecified: Secondary | ICD-10-CM | POA: Diagnosis not present

## 2013-08-02 ENCOUNTER — Ambulatory Visit: Payer: Medicare Other | Admitting: Interventional Cardiology

## 2013-08-05 DIAGNOSIS — H43819 Vitreous degeneration, unspecified eye: Secondary | ICD-10-CM | POA: Diagnosis not present

## 2013-08-05 DIAGNOSIS — H01009 Unspecified blepharitis unspecified eye, unspecified eyelid: Secondary | ICD-10-CM | POA: Diagnosis not present

## 2013-08-05 DIAGNOSIS — H2640 Unspecified secondary cataract: Secondary | ICD-10-CM | POA: Diagnosis not present

## 2013-08-05 DIAGNOSIS — Z961 Presence of intraocular lens: Secondary | ICD-10-CM | POA: Diagnosis not present

## 2013-08-12 ENCOUNTER — Encounter: Payer: Self-pay | Admitting: Interventional Cardiology

## 2013-08-12 ENCOUNTER — Encounter (INDEPENDENT_AMBULATORY_CARE_PROVIDER_SITE_OTHER): Payer: Self-pay

## 2013-08-12 ENCOUNTER — Ambulatory Visit (INDEPENDENT_AMBULATORY_CARE_PROVIDER_SITE_OTHER): Payer: Medicare Other | Admitting: Interventional Cardiology

## 2013-08-12 VITALS — BP 122/52 | HR 50 | Ht 72.0 in | Wt 194.0 lb

## 2013-08-12 DIAGNOSIS — R609 Edema, unspecified: Secondary | ICD-10-CM | POA: Insufficient documentation

## 2013-08-12 DIAGNOSIS — I712 Thoracic aortic aneurysm, without rupture, unspecified: Secondary | ICD-10-CM

## 2013-08-12 DIAGNOSIS — I2510 Atherosclerotic heart disease of native coronary artery without angina pectoris: Secondary | ICD-10-CM | POA: Diagnosis not present

## 2013-08-12 DIAGNOSIS — Z950 Presence of cardiac pacemaker: Secondary | ICD-10-CM

## 2013-08-12 DIAGNOSIS — E785 Hyperlipidemia, unspecified: Secondary | ICD-10-CM

## 2013-08-12 DIAGNOSIS — I442 Atrioventricular block, complete: Secondary | ICD-10-CM | POA: Diagnosis not present

## 2013-08-12 DIAGNOSIS — IMO0001 Reserved for inherently not codable concepts without codable children: Secondary | ICD-10-CM

## 2013-08-12 NOTE — Progress Notes (Signed)
Patient ID: David [name], male   DOB: 1941-05-18, 73 y.o.   MRN: [id number]    [place], [place], [place]  [address] Phone: [phone] Fax:  [phone]  Date:  08/12/2013   ID:  David [name], DOB 1941-05-06, MRN [id number]  PCP:  Mathews Argyle, MD      History of Present Illness: David [name] is a 73 y.o. male who had CHB followed by a pacer placement in 2012. No further chest pain. He thinks he is getting a little more fatigued.  He has been fine since then. He was not admitted to the hospital. He went to [facility] for a followup PET scan to look at his heart. No discomfort in his chest with walking or riding the bike. He is limited by back pain. He has had a mild thoracic aneurysm. No change in studies 4 years apart.  He has LE edema which has been getting worse.  He has gained 20 lbs.  He has not been walking lately.  Edema worse at the end of the day.    Wt Readings from Last 3 Encounters:  08/12/13 194 lb (87.998 kg)  07/09/13 190 lb (86.183 kg)     Past Medical History  Diagnosis Date  . Coronary artery disease   . Hypertension   . Pacemaker-St Judes 07/16/2011  . Mixed hyperlipidemia   . Pure hypercholesterolemia   . Coronary atherosclerosis of unspecified type of vessel, native or graft   . Type II or unspecified type diabetes mellitus without mention of complication, not stated as uncontrolled   . Peripheral neuropathy   . Other postoperative functional disorders   . Insomnia, unspecified   . Unspecified sleep apnea   . Thoracic aneurysm without mention of rupture     Measured at 45 mm in the past. Consider repeat MRA in 2015.  [name] Hyperglycemia   . Restless leg syndrome   . Ascending aorta dilatation     Fusiform dilatation ascending aorta 44 x 45 mm - 06/2007  . Heart murmur 04/25/09    Mild aortic calcification on echo 07/03/2009  . Spermatocele     near left testicle 2 cm  . Gilbert's syndrome   . Spinal stenosis, lumbar   .  Thrombocytopenia     Averaging 140,000 since 2008  . Tubular adenoma 09/2012    Dr. Watt Climes repeat 4 years  . Complete heart block     Treated with pacemaker in 02/2011 - Dr. [name]    Current Outpatient Prescriptions  Medication Sig Dispense Refill  . aspirin EC 325 MG tablet Take 325 mg by mouth daily.        . cholecalciferol (VITAMIN D) 1000 UNITS tablet Take 1,000 Units by mouth daily.      . Coenzyme Q10 (COQ10) 100 MG CAPS Take 100 mg by mouth.      . Cyanocobalamin (VITAMIN B-12) 2500 MCG SUBL Place 1 tablet under the tongue daily.      . famotidine (PEPCID) 10 MG tablet Take 10 mg by mouth 2 (two) times daily.       . metoprolol (LOPRESSOR) 50 MG tablet Take 25 mg by mouth 2 (two) times daily.       . Multiple Vitamin (MULTIVITAMIN) capsule TAKE 1 CAPSULE THREE TIMES A WEEK      . niacin (NIASPAN) 1000 MG CR tablet Take 2,000 mg by mouth at bedtime.       . nitroGLYCERIN (NITROSTAT) 0.4 MG SL tablet Place 0.4  mg under the tongue every 5 (five) minutes as needed. For chest pain        . omeprazole (PRILOSEC) 20 MG capsule Take 20 mg by mouth daily as needed. For acid reflux       . pramipexole (MIRAPEX) 1 MG tablet Take 2 mg by mouth at bedtime.        . simvastatin (ZOCOR) 10 MG tablet Take 10 mg by mouth daily.        . vitamin C (ASCORBIC ACID) 500 MG tablet Take 500 mg by mouth daily.       No current facility-administered medications for this visit.    Allergies:    Allergies  Allergen Reactions  . Benadryl [Diphenhydramine Hcl] Other (See Comments)    Adverse reaction, has very strange behavior. Also hyperactive.  . Restoril [Temazepam] Other (See Comments)    Hyperactive    Social History:  The patient  reports that he has quit smoking. He does not have any smokeless tobacco history on file. He reports that he drinks alcohol. He reports that he does not use illicit drugs.   Family History:  The patient's family history includes CAD in his father; Cancer in his  mother; Diabetes in his father; Heart disease in his father; Hypertension in his father and sister.   ROS:  Please see the history of present illness.  No nausea, vomiting.  No fevers, chills.  No focal weakness.  No dysuria. Leg swelling.  All other systems reviewed and negative.   PHYSICAL EXAM: VS:  BP 122/52  Pulse 50  Ht 6' (1.829 m)  Wt 194 lb (87.998 kg)  BMI 26.31 kg/m2 Well nourished, well developed, in no acute distress HEENT: normal Neck: no JVD, no carotid bruits Cardiac:  normal S1, S2; RRR;  Lungs:  clear to auscultation bilaterally, no wheezing, rhonchi or rales Abd: soft, nontender, no hepatomegaly Ext: bilateral edema, R>L -chronic Skin: warm and dry Neuro:   no focal abnormalities noted  EKG:     AV sequential pacing  ASSESSMENT AND PLAN:  Coronary atherosclerosis of unspecified type of vessel, native or graft  Continue Aspirin Tablet, 325 MG, 1 tablet, Orally, Once a day IMAGING: EKG   Harward,Amy 08/18/2012 01:46:56 PM > VARANASI,JAY 08/18/2012 02:29:54 PM > ventricular pacemaker   Notes: No angina. H/o LAD atherectomy in 1999. 2. Thoracic aneurysm without mention of rupture  Notes: Measured at 45 mm in the past, last was Dec 2012. Consider repeat CTA in 2015.  He would like to hold off until 2016. 3. Cardiac pacemaker in situ  Notes: Functioning well. Routine checks.  4. Combined hyperlipidemia  Continue Simvastatin Tablet, 10MG , TAKE 1 TABLET BY MOUTH ONCE A DAY EVERY EVENING Continue Niaspan Tablet Extended Release, 1000 MG, 2 tablets, Orally, Once a day Notes: Lipids reviewed.Well controlled. 12/14: HDL 59. LDL 67 5. Edema: Elevate legs at night so the ankles are above the level of the heart. Information about 20-30 mmHg compression stockings, below the knee, given. We'll try to avoid diuretics.  Signed, Mina Marble, MD, [facility] 08/12/2013 11:45 AM

## 2013-08-12 NOTE — Patient Instructions (Signed)
Your physician recommends that you continue on your current medications as directed. Please refer to the Current Medication list given to you today.  Order for compression stockings given to you today. You can take order to [facility].   Your physician wants you to follow-up in: 1 year with Dr. Irish Lack. You will receive a reminder letter in the mail two months in advance. If you don't receive a letter, please call our office to schedule the follow-up appointment.

## 2013-10-08 DIAGNOSIS — D485 Neoplasm of uncertain behavior of skin: Secondary | ICD-10-CM | POA: Diagnosis not present

## 2013-10-08 DIAGNOSIS — L851 Acquired keratosis [keratoderma] palmaris et plantaris: Secondary | ICD-10-CM | POA: Diagnosis not present

## 2013-10-11 ENCOUNTER — Encounter: Payer: Self-pay | Admitting: Internal Medicine

## 2013-10-11 ENCOUNTER — Ambulatory Visit (INDEPENDENT_AMBULATORY_CARE_PROVIDER_SITE_OTHER): Payer: Medicare Other | Admitting: *Deleted

## 2013-10-11 DIAGNOSIS — I442 Atrioventricular block, complete: Secondary | ICD-10-CM

## 2013-10-11 LAB — MDC_IDC_ENUM_SESS_TYPE_REMOTE
Battery Remaining Longevity: 78 mo
Battery Remaining Percentage: 69 %
Brady Statistic AP VP Percent: 93 %
Brady Statistic AS VP Percent: 7.1 %
Brady Statistic AS VS Percent: 1 %
Implantable Pulse Generator Model: 2210
Implantable Pulse Generator Serial Number: 7280216
Lead Channel Impedance Value: 390 Ohm
Lead Channel Impedance Value: 440 Ohm
Lead Channel Pacing Threshold Amplitude: 1.125 V
Lead Channel Pacing Threshold Pulse Width: 0.5 ms
Lead Channel Pacing Threshold Pulse Width: 0.5 ms
Lead Channel Sensing Intrinsic Amplitude: 1.5 mV
Lead Channel Setting Pacing Amplitude: 1.375
Lead Channel Setting Pacing Amplitude: 1.5 V
Lead Channel Setting Pacing Pulse Width: 0.5 ms
Lead Channel Setting Sensing Sensitivity: 4 mV
MDC IDC MSMT BATTERY VOLTAGE: 2.93 V
MDC IDC MSMT LEADCHNL RA PACING THRESHOLD AMPLITUDE: 0.5 V
MDC IDC MSMT LEADCHNL RV SENSING INTR AMPL: 12 mV
MDC IDC SESS DTM: 20150518075848
MDC IDC STAT BRADY AP VS PERCENT: 1 %
MDC IDC STAT BRADY RA PERCENT PACED: 93 %
MDC IDC STAT BRADY RV PERCENT PACED: 99 %

## 2013-10-11 NOTE — Progress Notes (Signed)
Remote pacemaker transmission.   

## 2013-10-22 ENCOUNTER — Encounter: Payer: Self-pay | Admitting: Cardiology

## 2013-11-08 DIAGNOSIS — N529 Male erectile dysfunction, unspecified: Secondary | ICD-10-CM | POA: Diagnosis not present

## 2013-11-08 DIAGNOSIS — E7800 Pure hypercholesterolemia, unspecified: Secondary | ICD-10-CM | POA: Diagnosis not present

## 2013-11-08 DIAGNOSIS — G479 Sleep disorder, unspecified: Secondary | ICD-10-CM | POA: Diagnosis not present

## 2013-11-08 DIAGNOSIS — I10 Essential (primary) hypertension: Secondary | ICD-10-CM | POA: Diagnosis not present

## 2013-11-23 ENCOUNTER — Telehealth: Payer: Self-pay | Admitting: Internal Medicine

## 2013-11-23 NOTE — Telephone Encounter (Signed)
New problem   Pt is questioning his remote check appt for august since he just had one May. Pt thought he need to come in office after his remote check in may. Please advise pt.

## 2013-11-23 NOTE — Telephone Encounter (Signed)
Pt will be out of town 01/02/14. Pt prefers 2 OV & 2 remotes annually, Not 1 OV w/ 3 remotes. Made device clinic appt for 12/30/13.

## 2013-12-24 DIAGNOSIS — D239 Other benign neoplasm of skin, unspecified: Secondary | ICD-10-CM | POA: Diagnosis not present

## 2013-12-24 DIAGNOSIS — L8990 Pressure ulcer of unspecified site, unspecified stage: Secondary | ICD-10-CM | POA: Diagnosis not present

## 2013-12-30 ENCOUNTER — Ambulatory Visit (INDEPENDENT_AMBULATORY_CARE_PROVIDER_SITE_OTHER): Payer: Medicare Other | Admitting: *Deleted

## 2013-12-30 ENCOUNTER — Encounter: Payer: Self-pay | Admitting: Internal Medicine

## 2013-12-30 DIAGNOSIS — I442 Atrioventricular block, complete: Secondary | ICD-10-CM | POA: Diagnosis not present

## 2013-12-30 DIAGNOSIS — Z950 Presence of cardiac pacemaker: Secondary | ICD-10-CM | POA: Diagnosis not present

## 2013-12-30 LAB — MDC_IDC_ENUM_SESS_TYPE_INCLINIC
Battery Remaining Longevity: 96 mo
Battery Voltage: 2.95 V
Brady Statistic RV Percent Paced: 99.99 %
Implantable Pulse Generator Model: 2210
Lead Channel Impedance Value: 437.5 Ohm
Lead Channel Pacing Threshold Pulse Width: 0.5 ms
Lead Channel Sensing Intrinsic Amplitude: 1.9 mV
Lead Channel Sensing Intrinsic Amplitude: 12 mV
Lead Channel Setting Pacing Amplitude: 1.625
Lead Channel Setting Pacing Pulse Width: 0.5 ms
Lead Channel Setting Sensing Sensitivity: 4 mV
MDC IDC MSMT LEADCHNL RA IMPEDANCE VALUE: 412.5 Ohm
MDC IDC MSMT LEADCHNL RA PACING THRESHOLD AMPLITUDE: 0.625 V
MDC IDC MSMT LEADCHNL RA PACING THRESHOLD PULSEWIDTH: 0.5 ms
MDC IDC MSMT LEADCHNL RV PACING THRESHOLD AMPLITUDE: 1 V
MDC IDC PG SERIAL: 7280216
MDC IDC SESS DTM: 20150806131429
MDC IDC SET LEADCHNL RV PACING AMPLITUDE: 1.25 V
MDC IDC STAT BRADY RA PERCENT PACED: 94 %

## 2013-12-30 NOTE — Progress Notes (Signed)
Pacemaker check in clinic. Normal device function. Thresholds, sensing, impedances consistent with previous measurements. Device programmed to maximize longevity. 1,525 mode switches--- <1%, longest 14 sec. No high ventricular rates noted. 882 PMT. Device programmed at appropriate safety margins. Histogram distribution appropriate for patient activity level. Device programmed to optimize intrinsic conduction. Estimated longevity 8.2-9.46yrs. Merlin 03/31/14 & ROV w/ GT in 49mo.

## 2014-03-31 ENCOUNTER — Ambulatory Visit (INDEPENDENT_AMBULATORY_CARE_PROVIDER_SITE_OTHER): Payer: Medicare Other | Admitting: *Deleted

## 2014-03-31 ENCOUNTER — Telehealth: Payer: Self-pay | Admitting: Internal Medicine

## 2014-03-31 ENCOUNTER — Encounter: Payer: Self-pay | Admitting: Internal Medicine

## 2014-03-31 DIAGNOSIS — I442 Atrioventricular block, complete: Secondary | ICD-10-CM

## 2014-03-31 LAB — MDC_IDC_ENUM_SESS_TYPE_REMOTE
Battery Remaining Percentage: 66 %
Battery Voltage: 2.93 V
Brady Statistic AP VP Percent: 95 %
Brady Statistic AP VS Percent: 1 %
Brady Statistic AS VP Percent: 5.3 %
Brady Statistic RA Percent Paced: 94 %
Brady Statistic RV Percent Paced: 99 %
Date Time Interrogation Session: 20151105082200
Implantable Pulse Generator Model: 2210
Implantable Pulse Generator Serial Number: 7280216
Lead Channel Impedance Value: 440 Ohm
Lead Channel Pacing Threshold Amplitude: 0.625 V
Lead Channel Pacing Threshold Pulse Width: 0.5 ms
Lead Channel Pacing Threshold Pulse Width: 0.5 ms
Lead Channel Sensing Intrinsic Amplitude: 1.5 mV
Lead Channel Sensing Intrinsic Amplitude: 12 mV
Lead Channel Setting Pacing Amplitude: 1.375
Lead Channel Setting Pacing Pulse Width: 0.5 ms
Lead Channel Setting Sensing Sensitivity: 4 mV
MDC IDC MSMT BATTERY REMAINING LONGEVITY: 75 mo
MDC IDC MSMT LEADCHNL RA IMPEDANCE VALUE: 410 Ohm
MDC IDC MSMT LEADCHNL RV PACING THRESHOLD AMPLITUDE: 1.125 V
MDC IDC SET LEADCHNL RA PACING AMPLITUDE: 1.625
MDC IDC STAT BRADY AS VS PERCENT: 1 %

## 2014-03-31 NOTE — Progress Notes (Signed)
Remote pacemaker transmission.   

## 2014-03-31 NOTE — Telephone Encounter (Signed)
LMOVM informing pt that we did received.

## 2014-03-31 NOTE — Telephone Encounter (Signed)
New Message  Pt called to discuss his remote check.. Did he send the signal correctly. Please call

## 2014-04-01 DIAGNOSIS — Z23 Encounter for immunization: Secondary | ICD-10-CM | POA: Diagnosis not present

## 2014-04-02 DIAGNOSIS — M47816 Spondylosis without myelopathy or radiculopathy, lumbar region: Secondary | ICD-10-CM | POA: Diagnosis not present

## 2014-04-02 DIAGNOSIS — M545 Low back pain: Secondary | ICD-10-CM | POA: Diagnosis not present

## 2014-04-13 DIAGNOSIS — M545 Low back pain: Secondary | ICD-10-CM | POA: Diagnosis not present

## 2014-04-15 ENCOUNTER — Encounter: Payer: Self-pay | Admitting: Cardiology

## 2014-04-20 DIAGNOSIS — M545 Low back pain: Secondary | ICD-10-CM | POA: Diagnosis not present

## 2014-04-25 DIAGNOSIS — M545 Low back pain: Secondary | ICD-10-CM | POA: Diagnosis not present

## 2014-04-27 DIAGNOSIS — M545 Low back pain: Secondary | ICD-10-CM | POA: Diagnosis not present

## 2014-05-02 DIAGNOSIS — M545 Low back pain: Secondary | ICD-10-CM | POA: Diagnosis not present

## 2014-05-04 DIAGNOSIS — M545 Low back pain: Secondary | ICD-10-CM | POA: Diagnosis not present

## 2014-05-09 DIAGNOSIS — M545 Low back pain: Secondary | ICD-10-CM | POA: Diagnosis not present

## 2014-05-13 DIAGNOSIS — Z1389 Encounter for screening for other disorder: Secondary | ICD-10-CM | POA: Diagnosis not present

## 2014-05-13 DIAGNOSIS — D696 Thrombocytopenia, unspecified: Secondary | ICD-10-CM | POA: Diagnosis not present

## 2014-05-13 DIAGNOSIS — I2510 Atherosclerotic heart disease of native coronary artery without angina pectoris: Secondary | ICD-10-CM | POA: Diagnosis not present

## 2014-05-13 DIAGNOSIS — I700 Atherosclerosis of aorta: Secondary | ICD-10-CM | POA: Diagnosis not present

## 2014-05-13 DIAGNOSIS — I10 Essential (primary) hypertension: Secondary | ICD-10-CM | POA: Diagnosis not present

## 2014-05-13 DIAGNOSIS — Z79899 Other long term (current) drug therapy: Secondary | ICD-10-CM | POA: Diagnosis not present

## 2014-05-13 DIAGNOSIS — Z0000 Encounter for general adult medical examination without abnormal findings: Secondary | ICD-10-CM | POA: Diagnosis not present

## 2014-05-13 DIAGNOSIS — E780 Pure hypercholesterolemia: Secondary | ICD-10-CM | POA: Diagnosis not present

## 2014-05-13 DIAGNOSIS — I77810 Thoracic aortic ectasia: Secondary | ICD-10-CM | POA: Diagnosis not present

## 2014-06-06 ENCOUNTER — Other Ambulatory Visit (HOSPITAL_COMMUNITY): Payer: Self-pay | Admitting: Geriatric Medicine

## 2014-06-06 ENCOUNTER — Ambulatory Visit (HOSPITAL_COMMUNITY): Payer: Medicare Other | Attending: Cardiology | Admitting: Cardiology

## 2014-06-06 DIAGNOSIS — I350 Nonrheumatic aortic (valve) stenosis: Secondary | ICD-10-CM | POA: Diagnosis not present

## 2014-06-06 DIAGNOSIS — I700 Atherosclerosis of aorta: Secondary | ICD-10-CM | POA: Diagnosis not present

## 2014-06-06 DIAGNOSIS — E785 Hyperlipidemia, unspecified: Secondary | ICD-10-CM | POA: Diagnosis not present

## 2014-06-06 DIAGNOSIS — R931 Abnormal findings on diagnostic imaging of heart and coronary circulation: Secondary | ICD-10-CM | POA: Diagnosis not present

## 2014-06-06 DIAGNOSIS — I10 Essential (primary) hypertension: Secondary | ICD-10-CM | POA: Insufficient documentation

## 2014-06-06 DIAGNOSIS — I712 Thoracic aortic aneurysm, without rupture: Secondary | ICD-10-CM | POA: Insufficient documentation

## 2014-06-06 DIAGNOSIS — Z959 Presence of cardiac and vascular implant and graft, unspecified: Secondary | ICD-10-CM | POA: Insufficient documentation

## 2014-06-06 DIAGNOSIS — R011 Cardiac murmur, unspecified: Secondary | ICD-10-CM | POA: Insufficient documentation

## 2014-06-06 DIAGNOSIS — Z87891 Personal history of nicotine dependence: Secondary | ICD-10-CM | POA: Diagnosis not present

## 2014-06-06 DIAGNOSIS — I2510 Atherosclerotic heart disease of native coronary artery without angina pectoris: Secondary | ICD-10-CM | POA: Diagnosis not present

## 2014-06-06 DIAGNOSIS — I340 Nonrheumatic mitral (valve) insufficiency: Secondary | ICD-10-CM | POA: Diagnosis not present

## 2014-06-06 NOTE — Progress Notes (Signed)
Echo performed. 

## 2014-06-08 DIAGNOSIS — M545 Low back pain: Secondary | ICD-10-CM | POA: Diagnosis not present

## 2014-06-10 ENCOUNTER — Telehealth: Payer: Self-pay | Admitting: Interventional Cardiology

## 2014-06-10 DIAGNOSIS — I700 Atherosclerosis of aorta: Secondary | ICD-10-CM

## 2014-06-10 NOTE — Telephone Encounter (Signed)
New Message  Schedule TEE per Dr. 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 Please call Margreta Journey with [facility] at [facility] ext [phone] because she has to send a referral.

## 2014-06-10 NOTE — Telephone Encounter (Signed)
Call just received back from Dr. Irish Lack. Per his recommendation, the patient should be seen early next week by NP/ PA and then arrange for TEE around the middle of next week. Will forward to triage to see the patient can be seen on the flex schedule on Monday and then scheduled for TEE. The patient had a recent echo on 06/06/14.

## 2014-06-10 NOTE — Telephone Encounter (Signed)
I spoke with Glenard Haring to see if we needed to see the patient in consult prior to scheduling the TEE since this is a referral from Dr. [name]. Advised to review with primary cardiologist to see if they are ok with scheduling this without seeing the patient. I have sent Dr. Irish Lack a message to place call me to discuss.

## 2014-06-13 ENCOUNTER — Ambulatory Visit (INDEPENDENT_AMBULATORY_CARE_PROVIDER_SITE_OTHER): Payer: Medicare Other | Admitting: Nurse Practitioner

## 2014-06-13 ENCOUNTER — Other Ambulatory Visit: Payer: Self-pay | Admitting: Nurse Practitioner

## 2014-06-13 ENCOUNTER — Encounter: Payer: Self-pay | Admitting: Nurse Practitioner

## 2014-06-13 VITALS — BP 102/74 | HR 67 | Ht 72.0 in | Wt 182.4 lb

## 2014-06-13 DIAGNOSIS — I2510 Atherosclerotic heart disease of native coronary artery without angina pectoris: Secondary | ICD-10-CM | POA: Diagnosis not present

## 2014-06-13 DIAGNOSIS — R931 Abnormal findings on diagnostic imaging of heart and coronary circulation: Secondary | ICD-10-CM

## 2014-06-13 DIAGNOSIS — I712 Thoracic aortic aneurysm, without rupture, unspecified: Secondary | ICD-10-CM

## 2014-06-13 NOTE — Telephone Encounter (Signed)
The pt is scheduled to see Truitt Merle, NP at 1:30 this afternoon, the pt is aware and agrees with date and time. TEE ordered and scheduled to be done on 06/14/14 with Dr Marlou Porch at 8 am, the pt is to arrive at 6:30 am. Instructions for TEE will be given to the pt at his OV with Cecille Rubin today.

## 2014-06-13 NOTE — Progress Notes (Signed)
CARDIOLOGY OFFICE NOTE  Date:  06/13/2014    [name] Date of Birth: 12/31/1940 Medical Record #[id number]  PCP:  Mathews Argyle, MD  Cardiologist:  Irish Lack   Chief Complaint  Patient presents with  . Follow-up    Work in visit following abnormal echo - seen for Dr. Irish Lack    History of Present Illness: David [name] is a 74 y.o. male who presents today for a work in visit. Seen for Dr. Irish Lack. He has a history of CHB with PPM implant in 2012 - followed by Dr. Lovena Le. Other issues include CAD (no specifics noted in EPIC), HTN, HLD, type 2 DM, neuropathy, thrombocytopenia, sleep apnea and thoracic aneurysm.  He sees Dr. [name] for his primary care.   Last seen here in March of 2015. Other than having gained weight - he was felt to be doing ok.   Referred back here from Dr. [name] (no records to review). Echo done last week with possible vegetation. 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 TEE has been suggested.  Thus added to my schedule today to facilitate TEE.  Comes in today. Here with his wife. He is doing ok. No chest pain. Not short of breath. Not dizzy or lightheaded. No syncope. No fever or chills. No obvious problems with his pacemaker. He feels fine. Says the TTE was done for a murmur. He tells me that Dr. [name] follows his labs. He had his aorta imaged last in 2012 by his cardiologist in [place].   Past Medical History  Diagnosis Date  . Coronary artery disease   . Hypertension   . Pacemaker-[place] 07/16/2011  . Mixed hyperlipidemia   . Pure hypercholesterolemia   . Coronary atherosclerosis of unspecified type of vessel, native or graft   . Type II or unspecified type diabetes mellitus without mention of complication, not stated as uncontrolled   . Peripheral neuropathy   . Other postoperative functional disorders   . Insomnia, unspecified   . Unspecified sleep apnea   . Thoracic aneurysm without mention of rupture     Measured at 45 mm  in the past. Consider repeat MRA in 2015.  [name] Hyperglycemia   . Restless leg syndrome   . Ascending aorta dilatation     Fusiform dilatation ascending aorta 44 x 45 mm - 06/2007  . Heart murmur 04/25/09    Mild aortic calcification on echo 07/03/2009  . Spermatocele     near left testicle 2 cm  . Gilbert's syndrome   . Spinal stenosis, lumbar   . Thrombocytopenia     Averaging 140,000 since 2008  . Tubular adenoma 09/2012    Dr. Watt Climes repeat 4 years  . Complete heart block     Treated with pacemaker in 02/2011 - Dr. [name]    Past Surgical History  Procedure Laterality Date  . Coronary stent placement    . Pacermaker  07/16/11    Dr. [name]  . Hernia repair Bilateral   . Cholecystectomy  1988  . Appendectomy  1957  . Hemorroidectomy  1980     Medications: Current Outpatient Prescriptions  Medication Sig Dispense Refill  . aspirin 81 MG tablet Take 81 mg by mouth daily. Take 81mg  by mouth daily    . cholecalciferol (VITAMIN D) 1000 UNITS tablet Take 1,000 Units by mouth daily.    . Coenzyme Q10 (COQ10) 100 MG CAPS Take 100 mg by mouth.    . Cyanocobalamin (VITAMIN B-12) 2500 MCG SUBL Place 1 tablet under the tongue  daily.    . famotidine (PEPCID) 10 MG tablet Take 10 mg by mouth 2 (two) times daily.     . metoprolol (LOPRESSOR) 50 MG tablet Take 25 mg by mouth 2 (two) times daily.     . Multiple Vitamin (MULTIVITAMIN) capsule TAKE 1 CAPSULE THREE TIMES A WEEK    . niacin (NIASPAN) 1000 MG CR tablet Take 2,000 mg by mouth at bedtime.     . nitroGLYCERIN (NITROSTAT) 0.4 MG SL tablet Place 0.4 mg under the tongue every 5 (five) minutes as needed. For chest pain      . Omega-3 Fatty Acids (FISH OIL PO) 1 capsule with a meal    . omeprazole (PRILOSEC) 20 MG capsule Take 20 mg by mouth daily as needed. For acid reflux     . pramipexole (MIRAPEX) 1 MG tablet Take 2 mg by mouth at bedtime.      . simvastatin (ZOCOR) 10 MG tablet Take 10 mg by mouth daily.      . vitamin C (ASCORBIC  ACID) 500 MG tablet Take 500 mg by mouth daily.     No current facility-administered medications for this visit.    Allergies: Allergies  Allergen Reactions  . Benadryl [Diphenhydramine Hcl] Other (See Comments)    Adverse reaction, has very strange behavior. Also hyperactive.  . Restoril [Temazepam] Other (See Comments)    Hyperactive    Social History: The patient  reports that he has quit smoking. He does not have any smokeless tobacco history on file. He reports that he drinks alcohol. He reports that he does not use illicit drugs.   Family History: The patient's family history includes CAD in his father; Cancer in his mother; Diabetes in his father; Heart disease in his father; Hypertension in his father and sister.   Review of Systems: Please see the history of present illness.   All other systems are reviewed and negative.   Physical Exam: VS:  BP 102/74 mmHg  Pulse 67  Ht 6' (1.829 m)  Wt 182 lb 6.4 oz (82.736 kg)  BMI 24.73 kg/m2  SpO2 99% .  BMI Body mass index is 24.73 kg/(m^2). General: Pleasant. Well developed, well nourished and in no acute distress.  HEENT: Normal. Neck: Supple, no JVD, carotid bruits, or masses noted.  Cardiac: Regular rate and rhythm. Soft outflow murmur noted.No rubs, or gallops. No edema.  Respiratory:  Lungs are clear to auscultation bilaterally with normal work of breathing.  GI: Soft and nontender.  MS: No deformity or atrophy. Gait and ROM intact. Skin: Warm and dry. Color is normal.  Neuro:  Strength and sensation are intact and no gross focal deficits noted.  Psych: Alert, appropriate and with normal affect.   Wt Readings from Last 3 Encounters:  06/13/14 182 lb 6.4 oz (82.736 kg)  08/12/13 194 lb (87.998 kg)  07/09/13 190 lb (86.183 kg)    LABORATORY DATA:  EKG:  EKG is ordered today. The EKG ordered today demonstrates a paced rhythm   Lab Results  Component Value Date   WBC 7.8 04/18/2011   HGB 14.7 04/18/2011   HCT  42.3 04/18/2011   PLT 168 04/18/2011   GLUCOSE 133* 04/18/2011   ALT 119* 03/28/2011   AST 80* 03/28/2011   NA 137 04/18/2011   K 4.1 04/18/2011   CL 101 04/18/2011   CREATININE 0.92 04/18/2011   BUN 19 04/18/2011   CO2 27 04/18/2011   TSH 1.120 03/27/2011   INR 1.11 03/27/2011  BNP (last 3 results) No results for input(s): PROBNP in the last 8760 hours.  Other Studies Reviewed Today:   Echo Study Conclusions from 06/06/2014  - Left ventricle: The cavity size was normal. Wall thickness was increased in a pattern of moderate LVH. The estimated ejection fraction was 55%. Wall motion was normal; there were no regional wall motion abnormalities. Features are consistent with a pseudonormal left ventricular filling pattern, with concomitant abnormal relaxation and increased filling pressure (grade 2 diastolic dysfunction). - Aortic valve: Trileaflet; moderately calcified leaflets. Visually appears to be at least mild aortic stenosis but mean gradient is in the normal range. - Aorta: Aortic root dimension: 37 mm (ED). Ascending aortic diameter: 44 mm (S). - Aortic root: The aortic root was mildly dilated. - Ascending aorta: The ascending aorta was moderately dilated. - Mitral valve: Mildly calcified annulus. Normal thickness leaflets . There was mild regurgitation. - Left atrium: The atrium was mildly dilated. There is a mobile density in the left atrium at the level of the annulus. This may be reverberation artifact (moves with mitral valve) but it appears more likely to be a calcified nodule attached to the annulus. I do not think it represents endocarditis. Cannot rule out small fibroelastoma. - Right ventricle: The cavity size was normal. Pacer wire or catheter noted in right ventricle. Systolic function was normal. - Right atrium: The atrium was mildly dilated. - Tricuspid valve: Peak RV-RA gradient (S): 26 mm Hg. - Pulmonary arteries:  PA peak pressure: 29 mm Hg (S). - Inferior vena cava: The vessel was normal in size. The respirophasic diameter changes were in the normal range (= 50%), consistent with normal central venous pressure.  Impressions:  - Normal LV size with moderate LV hypertrophy. EF 55%. Moderate diastolic dysfunction. Normal RV size and systolic function. Visually at least mild aortic stenosis but mean gradient not elevated. Ascending aorta aneurysm (4.4 cm). There was a small mobile density that appeared calcified and appeared to be attached to the mitral valve annulus. Possible small fibroelastoma or nodular calcification, less likely reverberation artifact. Would consider TEE to evaluate.   Assessment/Plan: 1. Abnormal echo with possible small fibroelastoma or nodular calcification noted - he will be referred on for TEE which is arranged for tomorrow. The patient understands that the risks associated with a TEE include sore throat, aspiration, perforation/tear of the esophagus and oversedation.   2. Underlying PPM - followed by Dr. Lovena Le  3. CAD - no active symptoms.   4. Diastolic dysfunction -  asymptomatic  5. Thoracic aneurysm - probably needs re imaging. He would like to defer this to Dr. Irish Lack and [place].   Current medicines are reviewed with the patient today.  The patient does not have concerns regarding medicines.  The following changes have been made:  no change  Labs/ tests ordered today include: TEE arranged for Tuesday at 8 am with Dr. Marlou Porch.    Disposition:   To follow. Tentatively see back in March as planned per recall.   Patient is agreeable to this plan and will call if any problems develop in the interim.   Signed: Burtis Junes, RN, ANP-C 06/13/2014 1:44 PM  [facility] [address] [place], [place]  [address] Phone: [phone] Fax: [phone]

## 2014-06-13 NOTE — Patient Instructions (Signed)
Your provider has recommended a TEE - transesophageal echocardiogram.  Please follow the directions given to you today in regards to this procedure.    Call the [place] office at [phone] if you have any questions, problems or concerns.   Transesophageal Echocardiogram Transesophageal echocardiography (TEE) is a special type of test that produces images of the heart by using sound waves (echocardiogram). This type of echocardiography can obtain better images of the heart than standard echocardiography. TEE is done by passing a flexible tube down the esophagus. The heart is located in front of the esophagus. Because the heart and esophagus are close to one another, your health care provider can take very clear, detailed pictures of the heart via ultrasound waves. TEE may be done:  If your health care provider needs more information based on standard echocardiography findings.  If you had a stroke. This might have happened because a clot formed in your heart. TEE can visualize different areas of the heart and check for clots.  To check valve anatomy and function.  To check for infection on the inside of your heart (endocarditis).  To evaluate the dividing wall (septum) of the heart and presence of a hole that did not close after birth (patent foramen ovale or atrial septal defect).  To help diagnose a tear in the wall of the aorta (aortic dissection).  During cardiac valve surgery. This allows the surgeon to assess the valve repair before closing the chest.  During a variety of other cardiac procedures to guide positioning of catheters.  Sometimes before a cardioversion, which is a shock to convert heart rhythm back to normal.  LET [facility] CARE PROVIDER KNOW ABOUT:   Any allergies you have.  All medicines you are taking, including vitamins, herbs, eye drops, creams, and over-the-counter medicines.  Previous problems you or members of your family  have had with the use of anesthetics.  Any blood disorders you have.  Previous surgeries you have had.  Medical conditions you have.  Swallowing difficulties.  An esophageal obstruction.  RISKS AND COMPLICATIONS  Generally, TEE is a safe procedure. However, as with any procedure, complications can occur. Possible complications include an esophageal tear (rupture), perforation, aspiration and/or oversedation. You may have a sore throat following this procedure.  BEFORE THE PROCEDURE   Do not eat or drink for 6 hours before the procedure or as directed by your health care provider.  Arrange for someone to drive you home after the procedure. Do not drive yourself home. During the procedure, you will be given medicines that can continue to make you feel drowsy and can impair your reflexes.  An IV access tube will be started in the arm.  PROCEDURE   A medicine to help you relax (sedative) will be given through the IV access tube.  A medicine may be sprayed or gargled to numb the back of the throat.  Your blood pressure, heart rate, and breathing (vital signs) will be monitored during the procedure.  The TEE probe is a long, flexible tube. The tip of the probe is placed into the back of the mouth, and you will be asked to swallow. This helps to pass the tip of the probe into the esophagus. Once the tip of the probe is in the correct area, your health care provider can take pictures of the heart.  TEE is usually not a painful procedure. You may feel the probe press against the back of the throat. The probe  does not enter the trachea and does not affect your breathing.  AFTER THE PROCEDURE   You will be in bed, resting, until you have fully returned to consciousness.  When you first awaken, your throat may feel slightly sore and will probably still feel numb. This will improve slowly over time.  You will not be allowed to eat or drink until it is clear that the numbness has  improved.  Once you have been able to drink, urinate, and sit on the edge of the bed without feeling sick to your stomach (nausea) or dizzy, you may be cleared to go home.  You should have a friend or family member with you for the next 24 hours after your procedure.

## 2014-06-14 ENCOUNTER — Encounter (HOSPITAL_COMMUNITY)
Admission: RE | Disposition: A | Discharge status: Home / Self Care | Payer: Self-pay | Source: Ambulatory Visit | Attending: Cardiology

## 2014-06-14 ENCOUNTER — Encounter (HOSPITAL_COMMUNITY): Payer: Self-pay | Admitting: *Deleted

## 2014-06-14 ENCOUNTER — Ambulatory Visit (HOSPITAL_COMMUNITY)
Admission: RE | Admit: 2014-06-14 | Discharge: 2014-06-14 | Disposition: A | Discharge status: Home / Self Care | Payer: Medicare Other | Source: Ambulatory Visit | Attending: Cardiology | Admitting: Cardiology

## 2014-06-14 DIAGNOSIS — G629 Polyneuropathy, unspecified: Secondary | ICD-10-CM | POA: Insufficient documentation

## 2014-06-14 DIAGNOSIS — Z87891 Personal history of nicotine dependence: Secondary | ICD-10-CM | POA: Diagnosis not present

## 2014-06-14 DIAGNOSIS — E782 Mixed hyperlipidemia: Secondary | ICD-10-CM | POA: Insufficient documentation

## 2014-06-14 DIAGNOSIS — I442 Atrioventricular block, complete: Secondary | ICD-10-CM | POA: Insufficient documentation

## 2014-06-14 DIAGNOSIS — R931 Abnormal findings on diagnostic imaging of heart and coronary circulation: Secondary | ICD-10-CM | POA: Diagnosis present

## 2014-06-14 DIAGNOSIS — Z950 Presence of cardiac pacemaker: Secondary | ICD-10-CM | POA: Diagnosis not present

## 2014-06-14 DIAGNOSIS — I340 Nonrheumatic mitral (valve) insufficiency: Secondary | ICD-10-CM | POA: Insufficient documentation

## 2014-06-14 DIAGNOSIS — G2581 Restless legs syndrome: Secondary | ICD-10-CM | POA: Diagnosis not present

## 2014-06-14 DIAGNOSIS — Z7982 Long term (current) use of aspirin: Secondary | ICD-10-CM | POA: Insufficient documentation

## 2014-06-14 DIAGNOSIS — E119 Type 2 diabetes mellitus without complications: Secondary | ICD-10-CM | POA: Insufficient documentation

## 2014-06-14 DIAGNOSIS — I2510 Atherosclerotic heart disease of native coronary artery without angina pectoris: Secondary | ICD-10-CM | POA: Diagnosis not present

## 2014-06-14 DIAGNOSIS — I10 Essential (primary) hypertension: Secondary | ICD-10-CM | POA: Insufficient documentation

## 2014-06-14 HISTORY — PX: TEE WITHOUT CARDIOVERSION: SHX5443

## 2014-06-14 LAB — GLUCOSE, CAPILLARY: Glucose-Capillary: 132 mg/dL — ABNORMAL HIGH (ref 70–99)

## 2014-06-14 SURGERY — ECHOCARDIOGRAM, TRANSESOPHAGEAL
Anesthesia: Moderate Sedation

## 2014-06-14 MED ORDER — FENTANYL CITRATE 0.05 MG/ML IJ SOLN
INTRAMUSCULAR | Status: AC
  Filled 2014-06-14: qty 2

## 2014-06-14 MED ORDER — BUTAMBEN-TETRACAINE-BENZOCAINE 2-2-14 % EX AERO
INHALATION_SPRAY | CUTANEOUS | Status: DC | PRN
  Administered 2014-06-14: 2 via TOPICAL

## 2014-06-14 MED ORDER — SODIUM CHLORIDE 0.9 % IV SOLN
INTRAVENOUS | Status: DC
  Administered 2014-06-14: 500 mL via INTRAVENOUS

## 2014-06-14 MED ORDER — MIDAZOLAM HCL 5 MG/ML IJ SOLN
INTRAMUSCULAR | Status: AC
  Filled 2014-06-14: qty 2

## 2014-06-14 MED ORDER — FENTANYL CITRATE 0.05 MG/ML IJ SOLN
INTRAMUSCULAR | Status: DC | PRN
  Administered 2014-06-14: 25 ug via INTRAVENOUS

## 2014-06-14 MED ORDER — MIDAZOLAM HCL 10 MG/2ML IJ SOLN
INTRAMUSCULAR | Status: DC | PRN
  Administered 2014-06-14: 1 mg via INTRAVENOUS
  Administered 2014-06-14: 2 mg via INTRAVENOUS

## 2014-06-14 MED ORDER — DIPHENHYDRAMINE HCL 50 MG/ML IJ SOLN
INTRAMUSCULAR | Status: AC
  Filled 2014-06-14: qty 1

## 2014-06-14 NOTE — Progress Notes (Signed)
Echocardiogram Echocardiogram Transesophageal has been performed.  David [name] 06/14/2014, 8:39 AM

## 2014-06-14 NOTE — Interval H&P Note (Signed)
History and Physical Interval Note:  06/14/2014 7:13 AM  David [name]  has presented today for surgery, with the diagnosis of CAD  The various methods of treatment have been discussed with the patient and family. After consideration of risks, benefits and other options for treatment, the patient has consented to  Procedure(s): TRANSESOPHAGEAL ECHOCARDIOGRAM (TEE) (N/A) as a surgical intervention .  The patient's history has been reviewed, patient examined, no change in status, stable for surgery.  I have reviewed the patient's chart and labs.  Questions were answered to the patient's satisfaction.     Ladrea Holladay

## 2014-06-14 NOTE — Discharge Instructions (Signed)
Transesophageal Echocardiogram °Transesophageal echocardiography (TEE) is a special type of test that produces images of the heart by using sound waves (echocardiogram). This type of echocardiography can obtain better images of the heart than standard echocardiography. TEE is done by passing a flexible tube down the esophagus. The heart is located in front of the esophagus. Because the heart and esophagus are close to one another, your health care provider can take very clear, detailed pictures of the heart via ultrasound waves. °TEE may be done: °· If your health care provider needs more information based on standard echocardiography findings. °· If you had a stroke. This might have happened because a clot formed in your heart. TEE can visualize different areas of the heart and check for clots. °· To check valve anatomy and function. °· To check for infection on the inside of your heart (endocarditis). °· To evaluate the dividing wall (septum) of the heart and presence of a hole that did not close after birth (patent foramen ovale or atrial septal defect). °· To help diagnose a tear in the wall of the aorta (aortic dissection). °· During cardiac valve surgery. This allows the surgeon to assess the valve repair before closing the chest. °· During a variety of other cardiac procedures to guide positioning of catheters. °· Sometimes before a cardioversion, which is a shock to convert heart rhythm back to normal. °LET YOUR HEALTH CARE PROVIDER KNOW ABOUT:  °· Any allergies you have. °· All medicines you are taking, including vitamins, herbs, eye drops, creams, and over-the-counter medicines. °· Previous problems you or members of your family have had with the use of anesthetics. °· Any blood disorders you have. °· Previous surgeries you have had. °· Medical conditions you have. °· Swallowing difficulties. °· An esophageal obstruction. °RISKS AND COMPLICATIONS  °Generally, TEE is a safe procedure. However, as with any  procedure, complications can occur. Possible complications include an esophageal tear (rupture). °BEFORE THE PROCEDURE  °· Do not eat or drink for 6 hours before the procedure or as directed by your health care provider. °· Arrange for someone to drive you home after the procedure. Do not drive yourself home. During the procedure, you will be given medicines that can continue to make you feel drowsy and can impair your reflexes. °· An IV access tube will be started in the arm. °PROCEDURE  °· A medicine to help you relax (sedative) will be given through the IV access tube. °· A medicine may be sprayed or gargled to numb the back of the throat. °· Your blood pressure, heart rate, and breathing (vital signs) will be monitored during the procedure. °· The TEE probe is a long, flexible tube. The tip of the probe is placed into the back of the mouth, and you will be asked to swallow. This helps to pass the tip of the probe into the esophagus. Once the tip of the probe is in the correct area, your health care provider can take pictures of the heart. °· TEE is usually not a painful procedure. You may feel the probe press against the back of the throat. The probe does not enter the trachea and does not affect your breathing. °AFTER THE PROCEDURE  °· You will be in bed, resting, until you have fully returned to consciousness. °· When you first awaken, your throat may feel slightly sore and will probably still feel numb. This will improve slowly over time. °· You will not be allowed to eat or drink until it   is clear that the numbness has improved. °· Once you have been able to drink, urinate, and sit on the edge of the bed without feeling sick to your stomach (nausea) or dizzy, you may be cleared to go home. °· You should have a friend or family member with you for the next 24 hours after your procedure. °Document Released: 08/03/2002 Document Revised: 05/18/2013 Document Reviewed: 11/12/2012 °[employer]® Patient Information  ©2015 [employer]. This information is not intended to replace advice given to you by your health care provider. Make sure you discuss any questions you have with your health care provider. ° °

## 2014-06-14 NOTE — CV Procedure (Addendum)
    TEE  INDICATION: ? LA mass  -No mass -Mild MR -Lipomatous hypertrophy -Aortic root dilated - 4.4-4.5cm ascending root -Normal EF -Dual chamber pacer  Reassuring  [name], MD

## 2014-06-14 NOTE — H&P (View-Only) (Signed)
CARDIOLOGY OFFICE NOTE  Date:  06/13/2014    David [name] Date of Birth: Jun 24, 1940 Medical Record #[id number]  PCP:  David Argyle, MD  Cardiologist:  David [name]   Chief Complaint  Patient presents with  . Follow-up    Work in visit following abnormal echo - seen for Dr. Irish [name]    History of Present Illness: David [name] is a 74 y.o. male who presents today for a work in visit. Seen for Dr. Irish [name]. He has a history of CHB with PPM implant in 2012 - followed by Dr. Lovena [name]. Other issues include CAD (no specifics noted in EPIC), HTN, HLD, type 2 DM, neuropathy, thrombocytopenia, sleep apnea and thoracic aneurysm.  He sees Dr. Felipa [name] for his primary care.   Last seen here in March of 2015. Other than having gained weight - he was felt to be doing ok.   Referred back here from Dr. Felipa [name] (no records to review). Echo done last week with possible vegetation. 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 TEE has been suggested.  Thus added to my schedule today to facilitate TEE.  [name] in today. Here with his wife. He is doing ok. No chest pain. Not short of breath. Not dizzy or lightheaded. No syncope. No fever or chills. No obvious problems with his pacemaker. He feels fine. Says the TTE was done for a murmur. He tells me that Dr. Felipa [name] follows his labs. He had his aorta imaged last in 2012 by his cardiologist in [place].   Past Medical History  Diagnosis Date  . Coronary artery disease   . Hypertension   . Pacemaker-[employer] Judes 07/16/2011  . Mixed hyperlipidemia   . Pure hypercholesterolemia   . Coronary atherosclerosis of unspecified type of vessel, native or graft   . Type II or unspecified type diabetes mellitus without mention of complication, not stated as uncontrolled   . Peripheral neuropathy   . Other postoperative functional disorders   . Insomnia, unspecified   . Unspecified sleep apnea   . Thoracic aneurysm without mention of rupture     Measured at 45 mm  in the past. Consider repeat MRA in 2015.  [name] Hyperglycemia   . Restless leg syndrome   . Ascending aorta dilatation     Fusiform dilatation ascending aorta 44 x 45 mm - 06/2007  . Heart murmur 04/25/09    Mild aortic calcification on echo 07/03/2009  . Spermatocele     near left testicle 2 cm  . Gilbert's syndrome   . Spinal stenosis, lumbar   . Thrombocytopenia     Averaging 140,000 since 2008  . Tubular adenoma 09/2012    Dr. Watt [name] repeat 4 years  . Complete heart block     Treated with pacemaker in 02/2011 - Dr. Caryl [name]    Past Surgical History  Procedure Laterality Date  . Coronary stent placement    . Pacermaker  07/16/11    Dr. Caryl [name]  . Hernia repair Bilateral   . Cholecystectomy  1988  . Appendectomy  1957  . Hemorroidectomy  1980     Medications: Current Outpatient Prescriptions  Medication Sig Dispense Refill  . aspirin 81 MG tablet Take 81 mg by mouth daily. Take 81mg  by mouth daily    . cholecalciferol (VITAMIN D) 1000 UNITS tablet Take 1,000 Units by mouth daily.    . Coenzyme Q10 (COQ10) 100 MG CAPS Take 100 mg by mouth.    . Cyanocobalamin (VITAMIN B-12) 2500 MCG SUBL Place 1 tablet under the tongue  daily.    . famotidine (PEPCID) 10 MG tablet Take 10 mg by mouth 2 (two) times daily.     . metoprolol (LOPRESSOR) 50 MG tablet Take 25 mg by mouth 2 (two) times daily.     . Multiple Vitamin (MULTIVITAMIN) capsule TAKE 1 CAPSULE THREE TIMES A WEEK    . niacin (NIASPAN) 1000 MG CR tablet Take 2,000 mg by mouth at bedtime.     . nitroGLYCERIN (NITROSTAT) 0.4 MG SL tablet Place 0.4 mg under the tongue every 5 (five) minutes as needed. For chest pain      . Omega-3 Fatty Acids (FISH OIL PO) 1 capsule with a meal    . omeprazole (PRILOSEC) 20 MG capsule Take 20 mg by mouth daily as needed. For acid reflux     . pramipexole (MIRAPEX) 1 MG tablet Take 2 mg by mouth at bedtime.      . simvastatin (ZOCOR) 10 MG tablet Take 10 mg by mouth daily.      . vitamin C (ASCORBIC  ACID) 500 MG tablet Take 500 mg by mouth daily.     No current facility-administered medications for this visit.    Allergies: Allergies  Allergen Reactions  . Benadryl [Diphenhydramine Hcl] Other (See Comments)    Adverse reaction, has very strange behavior. Also hyperactive.  . Restoril [Temazepam] Other (See Comments)    Hyperactive    Social History: The patient  reports that he has quit smoking. He does not have any smokeless tobacco history on file. He reports that he drinks alcohol. He reports that he does not use illicit drugs.   Family History: The patient's family history includes CAD in his father; Cancer in his mother; Diabetes in his father; Heart disease in his father; Hypertension in his father and sister.   Review of Systems: Please see the history of present illness.   All other systems are reviewed and negative.   Physical Exam: VS:  BP 102/74 mmHg  Pulse 67  Ht 6' (1.829 m)  Wt 182 lb 6.4 oz (82.736 kg)  BMI 24.73 kg/m2  SpO2 99% .  BMI Body mass index is 24.73 kg/(m^2). General: Pleasant. Well developed, well nourished and in no acute distress.  HEENT: Normal. Neck: Supple, no JVD, carotid bruits, or masses noted.  Cardiac: Regular rate and rhythm. Soft outflow murmur noted.No rubs, or gallops. No edema.  Respiratory:  Lungs are clear to auscultation bilaterally with normal work of breathing.  GI: Soft and nontender.  MS: No deformity or atrophy. Gait and ROM intact. Skin: Warm and dry. Color is normal.  Neuro:  Strength and sensation are intact and no gross focal deficits noted.  Psych: Alert, appropriate and with normal affect.   Wt Readings from Last 3 Encounters:  06/13/14 182 lb 6.4 oz (82.736 kg)  08/12/13 194 lb (87.998 kg)  07/09/13 190 lb (86.183 kg)    LABORATORY DATA:  EKG:  EKG is ordered today. The EKG ordered today demonstrates a paced rhythm   Lab Results  Component Value Date   WBC 7.8 04/18/2011   HGB 14.7 04/18/2011   HCT  42.3 04/18/2011   PLT 168 04/18/2011   GLUCOSE 133* 04/18/2011   ALT 119* 03/28/2011   AST 80* 03/28/2011   NA 137 04/18/2011   K 4.1 04/18/2011   CL 101 04/18/2011   CREATININE 0.92 04/18/2011   BUN 19 04/18/2011   CO2 27 04/18/2011   TSH 1.120 03/27/2011   INR 1.11 03/27/2011  BNP (last 3 results) No results for input(s): PROBNP in the last 8760 hours.  Other Studies Reviewed Today:   Echo Study Conclusions from 06/06/2014  - Left ventricle: The cavity size was normal. Wall thickness was increased in a pattern of moderate LVH. The estimated ejection fraction was 55%. Wall motion was normal; there were no regional wall motion abnormalities. Features are consistent with a pseudonormal left ventricular filling pattern, with concomitant abnormal relaxation and increased filling pressure (grade 2 diastolic dysfunction). - Aortic valve: Trileaflet; moderately calcified leaflets. Visually appears to be at least mild aortic stenosis but mean gradient is in the normal range. - Aorta: Aortic root dimension: 37 mm (ED). Ascending aortic diameter: 44 mm (S). - Aortic root: The aortic root was mildly dilated. - Ascending aorta: The ascending aorta was moderately dilated. - Mitral valve: Mildly calcified annulus. Normal thickness leaflets . There was mild regurgitation. - Left atrium: The atrium was mildly dilated. There is a mobile density in the left atrium at the level of the annulus. This may be reverberation artifact (moves with mitral valve) but it appears more likely to be a calcified nodule attached to the annulus. I do not think it represents endocarditis. Cannot rule out small fibroelastoma. - Right ventricle: The cavity size was normal. Pacer wire or catheter noted in right ventricle. Systolic function was normal. - Right atrium: The atrium was mildly dilated. - Tricuspid valve: Peak RV-RA gradient (S): 26 mm Hg. - Pulmonary arteries:  PA peak pressure: 29 mm Hg (S). - Inferior vena cava: The vessel was normal in size. The respirophasic diameter changes were in the normal range (= 50%), consistent with normal central venous pressure.  Impressions:  - Normal LV size with moderate LV hypertrophy. EF 55%. Moderate diastolic dysfunction. Normal RV size and systolic function. Visually at least mild aortic stenosis but mean gradient not elevated. Ascending aorta aneurysm (4.4 cm). There was a small mobile density that appeared calcified and appeared to be attached to the mitral valve annulus. Possible small fibroelastoma or nodular calcification, less likely reverberation artifact. Would consider TEE to evaluate.   Assessment/Plan: 1. Abnormal echo with possible small fibroelastoma or nodular calcification noted - he will be referred on for TEE which is arranged for tomorrow. The patient understands that the risks associated with a TEE include sore throat, aspiration, perforation/tear of the esophagus and oversedation.   2. Underlying PPM - followed by Dr. Lovena [name]  3. CAD - no active symptoms.   4. Diastolic dysfunction -  asymptomatic  5. Thoracic aneurysm - probably needs re imaging. He would like to defer this to Dr. Irish [name] and [place].   Current medicines are reviewed with the patient today.  The patient does not have concerns regarding medicines.  The following changes have been made:  no change  Labs/ tests ordered today include: TEE arranged for Tuesday at 8 am with Dr. Marlou Porch.    Disposition:   To follow. Tentatively see back in March as planned per recall.   Patient is agreeable to this plan and will call if any problems develop in the interim.   Signed: Burtis Junes, RN, ANP-C 06/13/2014 1:44 PM  [facility] [address] [place], [place]  [address] Phone: [phone] Fax: [phone]

## 2014-06-15 ENCOUNTER — Encounter (HOSPITAL_COMMUNITY): Payer: Self-pay | Admitting: Cardiology

## 2014-06-16 DIAGNOSIS — Z79899 Other long term (current) drug therapy: Secondary | ICD-10-CM | POA: Diagnosis not present

## 2014-06-16 DIAGNOSIS — R7309 Other abnormal glucose: Secondary | ICD-10-CM | POA: Diagnosis not present

## 2014-07-04 DIAGNOSIS — E119 Type 2 diabetes mellitus without complications: Secondary | ICD-10-CM | POA: Diagnosis not present

## 2014-07-13 ENCOUNTER — Encounter: Payer: Self-pay | Admitting: Internal Medicine

## 2014-07-13 ENCOUNTER — Ambulatory Visit (INDEPENDENT_AMBULATORY_CARE_PROVIDER_SITE_OTHER): Payer: Medicare Other | Admitting: Internal Medicine

## 2014-07-13 VITALS — BP 100/60 | HR 60 | Ht 72.0 in | Wt 185.0 lb

## 2014-07-13 DIAGNOSIS — Z45018 Encounter for adjustment and management of other part of cardiac pacemaker: Secondary | ICD-10-CM

## 2014-07-13 DIAGNOSIS — I480 Paroxysmal atrial fibrillation: Secondary | ICD-10-CM | POA: Diagnosis not present

## 2014-07-13 DIAGNOSIS — I4891 Unspecified atrial fibrillation: Secondary | ICD-10-CM | POA: Insufficient documentation

## 2014-07-13 DIAGNOSIS — I442 Atrioventricular block, complete: Secondary | ICD-10-CM

## 2014-07-13 DIAGNOSIS — I2510 Atherosclerotic heart disease of native coronary artery without angina pectoris: Secondary | ICD-10-CM | POA: Diagnosis not present

## 2014-07-13 DIAGNOSIS — Z950 Presence of cardiac pacemaker: Secondary | ICD-10-CM

## 2014-07-13 DIAGNOSIS — Z79899 Other long term (current) drug therapy: Secondary | ICD-10-CM

## 2014-07-13 DIAGNOSIS — E785 Hyperlipidemia, unspecified: Secondary | ICD-10-CM | POA: Diagnosis not present

## 2014-07-13 LAB — MDC_IDC_ENUM_SESS_TYPE_INCLINIC
Battery Remaining Longevity: 76.8 mo
Battery Voltage: 2.92 V
Brady Statistic RA Percent Paced: 94 %
Brady Statistic RV Percent Paced: 99.98 %
Date Time Interrogation Session: 20160217135840
Implantable Pulse Generator Model: 2210
Lead Channel Impedance Value: 400 Ohm
Lead Channel Pacing Threshold Amplitude: 0.5 V
Lead Channel Sensing Intrinsic Amplitude: 12 mV
Lead Channel Sensing Intrinsic Amplitude: 2.1 mV
Lead Channel Setting Pacing Amplitude: 1.375
Lead Channel Setting Pacing Amplitude: 1.5 V
Lead Channel Setting Pacing Pulse Width: 0.5 ms
MDC IDC MSMT LEADCHNL RA PACING THRESHOLD PULSEWIDTH: 0.5 ms
MDC IDC MSMT LEADCHNL RV IMPEDANCE VALUE: 437.5 Ohm
MDC IDC MSMT LEADCHNL RV PACING THRESHOLD AMPLITUDE: 1.125 V
MDC IDC MSMT LEADCHNL RV PACING THRESHOLD PULSEWIDTH: 0.5 ms
MDC IDC PG SERIAL: 7280216
MDC IDC SET LEADCHNL RV SENSING SENSITIVITY: 4 mV

## 2014-07-13 MED ORDER — APIXABAN 5 MG PO TABS: 5.0000 mg | ORAL_TABLET | Freq: Two times a day (BID) | ORAL | Status: DC

## 2014-07-13 NOTE — Assessment & Plan Note (Signed)
His [employer] DDD PM is working normally. No change in programming today. Will recheck in several months.

## 2014-07-13 NOTE — Patient Instructions (Addendum)
Your physician has recommended you make the following change in your medication:  1) STOP Aspirin 2) START Eliquis 5 mg twice a day  Your physician recommends that you return for lab work in: 4 weeks for CBCD and BMP  Remote monitoring is used to monitor your Pacemaker of ICD from home. This monitoring reduces the number of office visits required to check your device to one time per year. It allows [place] to keep an eye on the functioning of your device to ensure it is working properly. You are scheduled for a device check from home on 10/12/14. You may send your transmission at any time that day. If you have a wireless device, the transmission will be sent automatically. After your physician reviews your transmission, you will receive a postcard with your next transmission date.  Your physician wants you to follow-up in: 6 months with device clinic.  You will receive a reminder letter in the mail two months in advance. If you don't receive a letter, please call our office to schedule the follow-up appointment.  Your physician wants you to follow-up in: 1 year with Dr. Lovena Le.  You will receive a reminder letter in the mail two months in advance. If you don't receive a letter, please call our office to schedule the follow-up appointment.

## 2014-07-13 NOTE — Assessment & Plan Note (Signed)
No anginal symptoms. He remains physically active.  No change in meds.

## 2014-07-13 NOTE — Assessment & Plan Note (Signed)
He is having PAF lasting up to 3 hours in duration. We discussed the treatment  Options with regard to prevention of stroke. He will start Eliquis.

## 2014-07-13 NOTE — Progress Notes (Signed)
HPI Mr. Dresch returns today for evaluation and management of his DDD PM by Dr. Irish Lack. He has a h/o complete heart block and underwent PPM insertion aproximately 3.5 years ago. He has done well. No syncope. He denies any CHF symptoms. No peripheral edema. He drives a motorcycle. He remains acitve, exercising on a regular basis. No palpitations. Allergies  Allergen Reactions  . Benadryl [Diphenhydramine Hcl] Other (See Comments)    Adverse reaction, has very strange behavior. Also hyperactive.  . Restoril [Temazepam] Other (See Comments)    Hyperactive     Current Outpatient Prescriptions  Medication Sig Dispense Refill  . cholecalciferol (VITAMIN D) 1000 UNITS tablet Take 1,000 Units by mouth daily.    . Coenzyme Q10 (COQ10) 100 MG CAPS Take 100 mg by mouth daily.     . Cyanocobalamin (VITAMIN B-12) 2500 MCG SUBL Place 1 tablet under the tongue daily.    . famotidine (PEPCID) 10 MG tablet Take 10 mg by mouth 2 (two) times daily.     . metoprolol (LOPRESSOR) 50 MG tablet Take 25 mg by mouth 2 (two) times daily.     . Multiple Vitamin (MULTIVITAMIN) capsule TAKE 1 CAPSULE THREE TIMES A WEEK    . niacin (NIASPAN) 1000 MG CR tablet Take 2,000 mg by mouth at bedtime.     . nitroGLYCERIN (NITROSTAT) 0.4 MG SL tablet Place 0.4 mg under the tongue every 5 (five) minutes as needed. For chest pain      . Omega-3 Fatty Acids (FISH OIL PO) 1 capsule with a meal    . omeprazole (PRILOSEC) 20 MG capsule Take 20 mg by mouth daily as needed. For acid reflux     . pramipexole (MIRAPEX) 1 MG tablet Take 2 mg by mouth at bedtime.      . simvastatin (ZOCOR) 10 MG tablet Take 10 mg by mouth daily.      . vitamin C (ASCORBIC ACID) 500 MG tablet Take 500 mg by mouth daily.    [name] apixaban (ELIQUIS) 5 MG TABS tablet Take 1 tablet (5 mg total) by mouth 2 (two) times daily. 60 tablet 0  . apixaban (ELIQUIS) 5 MG TABS tablet Take 1 tablet (5 mg total) by mouth 2 (two) times daily. 60 tablet 10   No  current facility-administered medications for this visit.     Past Medical History  Diagnosis Date  . Coronary artery disease   . Hypertension   . Pacemaker-[employer] Judes 07/16/2011  . Mixed hyperlipidemia   . Pure hypercholesterolemia   . Coronary atherosclerosis of unspecified type of vessel, native or graft   . Type II or unspecified type diabetes mellitus without mention of complication, not stated as uncontrolled   . Peripheral neuropathy   . Other postoperative functional disorders   . Insomnia, unspecified   . Unspecified sleep apnea   . Thoracic aneurysm without mention of rupture     Measured at 45 mm in the past. Consider repeat MRA in 2015.  [name] Hyperglycemia   . Restless leg syndrome   . Ascending aorta dilatation     Fusiform dilatation ascending aorta 44 x 45 mm - 06/2007  . Heart murmur 04/25/09    Mild aortic calcification on echo 07/03/2009  . Spermatocele     near left testicle 2 cm  . Gilbert's syndrome   . Spinal stenosis, lumbar   . Thrombocytopenia     Averaging 140,000 since 2008  . Tubular adenoma 09/2012    Dr.  Magod repeat 4 years  . Complete heart block     Treated with pacemaker in 02/2011 - Dr. [name]    ROS:   All systems reviewed and negative except as noted in the HPI.   Past Surgical History  Procedure Laterality Date  . Coronary stent placement    . Pacermaker  07/16/11    Dr. [name]  . Hernia repair Bilateral   . Cholecystectomy  1988  . Appendectomy  1957  . Hemorroidectomy  1980  . Tee without cardioversion N/A 06/14/2014    Procedure: TRANSESOPHAGEAL ECHOCARDIOGRAM (TEE);  Surgeon: [name], MD;  Location: [facility] ENDOSCOPY;  Service: Cardiovascular;  Laterality: N/A;     Family History  Problem Relation Age of Onset  . Cancer Mother     Small cell lung cancer  . Diabetes Father     DM  . CAD Father   . Hypertension Father   . Heart disease Father   . Hypertension Sister      History   Social History  . Marital Status:  Married    Spouse Name: N/A  . Number of Children: N/A  . Years of Education: N/A   Occupational History  . Not on file.   Social History Main Topics  . Smoking status: Former Research scientist (life sciences)  . Smokeless tobacco: Not on file  . Alcohol Use: Yes     Comment: occasional glass of wine  . Drug Use: No  . Sexual Activity: Not on file   Other Topics Concern  . Not on file   Social History Narrative     BP 100/60 mmHg  Pulse 60  Ht 6' (1.829 m)  Wt 185 lb (83.915 kg)  BMI 25.08 kg/m2  Physical Exam:  Well appearing 74 yo man, NAD HEENT: Unremarkable Neck:  No JVD, no thyromegally Back:  No CVA tenderness Lungs:  Clear with no wheezes, rales, or rhonchi, well healed PPM incision. HEART:  Regular rate rhythm, no murmurs, no rubs, no clicks Abd:  soft, positive bowel sounds, no organomegally, no rebound, no guarding Ext:  2 plus pulses, no edema, no cyanosis, no clubbing Skin:  No rashes no nodules Neuro:  CN II through XII intact, motor grossly intact   DEVICE  Normal device function.  See PaceArt for details.   Assess/Plan:

## 2014-07-13 NOTE — Assessment & Plan Note (Signed)
He is encouraged to continue his simvastatin. No other changes.

## 2014-08-02 ENCOUNTER — Encounter: Payer: Self-pay | Admitting: Internal Medicine

## 2014-08-10 ENCOUNTER — Other Ambulatory Visit (INDEPENDENT_AMBULATORY_CARE_PROVIDER_SITE_OTHER): Payer: Medicare Other | Admitting: *Deleted

## 2014-08-10 DIAGNOSIS — Z961 Presence of intraocular lens: Secondary | ICD-10-CM | POA: Diagnosis not present

## 2014-08-10 DIAGNOSIS — Z79899 Other long term (current) drug therapy: Secondary | ICD-10-CM | POA: Diagnosis not present

## 2014-08-10 DIAGNOSIS — H43813 Vitreous degeneration, bilateral: Secondary | ICD-10-CM | POA: Diagnosis not present

## 2014-08-10 DIAGNOSIS — H26493 Other secondary cataract, bilateral: Secondary | ICD-10-CM | POA: Diagnosis not present

## 2014-08-10 LAB — BASIC METABOLIC PANEL
BUN: 24 mg/dL — AB (ref 6–23)
CALCIUM: 9.2 mg/dL (ref 8.4–10.5)
CHLORIDE: 104 meq/L (ref 96–112)
CO2: 29 meq/L (ref 19–32)
CREATININE: 1.09 mg/dL (ref 0.40–1.50)
GFR: 69.87 mL/min (ref >60.00)
GLUCOSE: 109 mg/dL — AB (ref 70–99)
Potassium: 4 mEq/L (ref 3.5–5.1)
Sodium: 137 mEq/L (ref 135–145)

## 2014-08-10 LAB — CBC WITH DIFFERENTIAL/PLATELET
Basophils Absolute: 0 10*3/uL (ref 0.0–0.1)
Basophils Relative: 0.4 % (ref 0.0–3.0)
EOS ABS: 0.3 10*3/uL (ref 0.0–0.7)
Eosinophils Relative: 4.1 % (ref 0.0–5.0)
HCT: 41.7 % (ref 39.0–52.0)
Hemoglobin: 14.2 g/dL (ref 13.0–17.0)
Lymphocytes Relative: 31.7 % (ref 12.0–46.0)
Lymphs Abs: 2 10*3/uL (ref 0.7–4.0)
MCHC: 34 g/dL (ref 30.0–36.0)
MCV: 95.8 fl (ref 78.0–100.0)
MONO ABS: 0.5 10*3/uL (ref 0.1–1.0)
Monocytes Relative: 7.9 % (ref 3.0–12.0)
Neutro Abs: 3.6 10*3/uL (ref 1.4–7.7)
Neutrophils Relative %: 55.9 % (ref 43.0–77.0)
Platelets: 120 10*3/uL — ABNORMAL LOW (ref 150.0–400.0)
RBC: 4.35 Mil/uL (ref 4.22–5.81)
RDW: 13.4 % (ref 11.5–15.5)
WBC: 6.4 10*3/uL (ref 4.0–10.5)

## 2014-08-15 ENCOUNTER — Ambulatory Visit (INDEPENDENT_AMBULATORY_CARE_PROVIDER_SITE_OTHER): Payer: Medicare Other | Admitting: Interventional Cardiology

## 2014-08-15 ENCOUNTER — Encounter: Payer: Self-pay | Admitting: Interventional Cardiology

## 2014-08-15 VITALS — BP 120/58 | HR 64 | Ht 72.0 in | Wt 179.8 lb

## 2014-08-15 DIAGNOSIS — I2510 Atherosclerotic heart disease of native coronary artery without angina pectoris: Secondary | ICD-10-CM | POA: Diagnosis not present

## 2014-08-15 DIAGNOSIS — R609 Edema, unspecified: Secondary | ICD-10-CM

## 2014-08-15 DIAGNOSIS — I712 Thoracic aortic aneurysm, without rupture, unspecified: Secondary | ICD-10-CM

## 2014-08-15 DIAGNOSIS — Z7901 Long term (current) use of anticoagulants: Secondary | ICD-10-CM

## 2014-08-15 DIAGNOSIS — I480 Paroxysmal atrial fibrillation: Secondary | ICD-10-CM

## 2014-08-15 MED ORDER — APIXABAN 5 MG PO TABS: 5.0000 mg | ORAL_TABLET | Freq: Two times a day (BID) | ORAL | Status: DC

## 2014-08-15 NOTE — Progress Notes (Signed)
Patient ID: David [name], male   DOB: [birth year], 74 y.o.   MRN: [id number]    [place], [place], [place]  [address] Phone: [phone] Fax:  [phone]  Date:  08/15/2014   ID:  David [name], DOB [birth year], MRN [id number]  PCP:  Mathews Argyle, MD      History of Present Illness: David [name] is a 74 y.o. male who had CHB followed by a pacer placement in 2012. No further chest pain. He thinks he is getting a little more fatigued.  He was diagnosed with atrial fibrillation on pacer check.  He was started on Eliquis.   H/o LAD atherectomy in 1999.   No discomfort in his chest with walking or riding the bike. He is limited by back pain. He has had a mild thoracic aneurysm. No change in studies 4 years apart.  He has LE edema which has been getting worse.  He has lost 6 lbs since the last visit.  He has not been walking lately.  Edema worse at the end of the day.  His back pain has limited him.  He does the stationary bike since 4-5x/week.  His fasting blood sugar was mildly elevated and he is trying to modify this with lifestyle changes.  He is looking at other treatments for diabetes including the "super starch."  He has many questions about Eliquis monitoring and his pacer, being prominent in the chest wall.    Wt Readings from Last 3 Encounters:  08/15/14 179 lb 12.8 oz (81.557 kg)  07/13/14 185 lb (83.915 kg)  06/13/14 182 lb 6.4 oz (82.736 kg)     Past Medical History  Diagnosis Date  . Coronary artery disease   . Hypertension   . Pacemaker-St Judes 07/16/2011  . Mixed hyperlipidemia   . Pure hypercholesterolemia   . Coronary atherosclerosis of unspecified type of vessel, native or graft   . Type II or unspecified type diabetes mellitus without mention of complication, not stated as uncontrolled   . Peripheral neuropathy   . Other postoperative functional disorders   . Insomnia, unspecified   . Unspecified sleep apnea   . Thoracic aneurysm  without mention of rupture     Measured at 45 mm in the past. Consider repeat MRA in 2015.  [name] Hyperglycemia   . Restless leg syndrome   . Ascending aorta dilatation     Fusiform dilatation ascending aorta 44 x 45 mm - 06/2007  . Heart murmur 04/25/09    Mild aortic calcification on echo 07/03/2009  . Spermatocele     near left testicle 2 cm  . Gilbert's syndrome   . Spinal stenosis, lumbar   . Thrombocytopenia     Averaging 140,000 since 2008  . Tubular adenoma 09/2012    Dr. Watt Climes repeat 4 years  . Complete heart block     Treated with pacemaker in 02/2011 - Dr. [name]    Current Outpatient Prescriptions  Medication Sig Dispense Refill  . apixaban (ELIQUIS) 5 MG TABS tablet Take 1 tablet (5 mg total) by mouth 2 (two) times daily. 60 tablet 10  . cholecalciferol (VITAMIN D) 1000 UNITS tablet Take 1,000 Units by mouth daily.    . Coenzyme Q10 (COQ10) 100 MG CAPS Take 100 mg by mouth daily.     . Cyanocobalamin (VITAMIN B-12) 2500 MCG SUBL Place 1 tablet under the tongue daily.    . famotidine (PEPCID) 10 MG tablet Take 10 mg by mouth 2 (two) times  daily.     . loratadine-pseudoephedrine (CLARITIN-D 24-HOUR) 10-240 MG per 24 hr tablet Take 1 tablet by mouth daily.    . metoprolol (LOPRESSOR) 50 MG tablet Take 25 mg by mouth 2 (two) times daily.     . Multiple Vitamin (MULTIVITAMIN) capsule TAKE 1 CAPSULE THREE TIMES A WEEK    . naproxen (NAPROSYN) 500 MG tablet Take 500 mg by mouth 2 (two) times daily as needed for mild pain.    . nitroGLYCERIN (NITROSTAT) 0.4 MG SL tablet Place 0.4 mg under the tongue every 5 (five) minutes as needed. For chest pain      . Omega-3 Fatty Acids (FISH OIL PO) 1 capsule with a meal    . omeprazole (PRILOSEC) 20 MG capsule Take 20 mg by mouth daily as needed. For acid reflux     . pramipexole (MIRAPEX) 1 MG tablet Take 2 mg by mouth at bedtime.      . simvastatin (ZOCOR) 10 MG tablet Take 10 mg by mouth daily.      . vitamin C (ASCORBIC ACID) 500 MG tablet  Take 500 mg by mouth daily.     No current facility-administered medications for this visit.    Allergies:    Allergies  Allergen Reactions  . Benadryl [Diphenhydramine Hcl] Other (See Comments)    Adverse reaction, has very strange behavior. Also hyperactive.  [name] Antihistamines, Chlorpheniramine-Type Palpitations  . Restoril [Temazepam] Other (See Comments) and Palpitations    Hyperactive    Social History:  The patient  reports that he has quit smoking. He does not have any smokeless tobacco history on file. He reports that he drinks alcohol. He reports that he does not use illicit drugs.   Family History:  The patient's family history includes CAD in his father; Cancer in his mother; Diabetes in his father; Heart disease in his father; Hypertension in his father and sister.   ROS:  Please see the history of present illness.  No nausea, vomiting.  No fevers, chills.  No focal weakness.  No dysuria. Leg swelling.  All other systems reviewed and negative.   PHYSICAL EXAM: VS:  BP 120/58 mmHg  Pulse 64  Ht 6' (1.829 m)  Wt 179 lb 12.8 oz (81.557 kg)  BMI 24.38 kg/m2  SpO2 97% Well nourished, well developed, in no acute distress HEENT: normal Neck: no JVD, no carotid bruits Cardiac:  normal S1, S2; RRR; prominent pacer wires in the left chest-skin intact Lungs:  clear to auscultation bilaterally, no wheezing, rhonchi or rales Abd: soft, nontender, no hepatomegaly Ext: bilateral edema, R>L -chronic Skin: warm and dry Neuro:   no focal abnormalities noted Psych: normal affect   ASSESSMENT AND PLAN:  Coronary atherosclerosis of unspecified type of vessel, native or graft  Stopped Aspirin Tablet, 325 MG, 1 tablet, Orally, Once a day due to Eliquis.   Notes: No angina. H/o LAD atherectomy in 1999. 2. Thoracic aneurysm without mention of rupture  Notes: Measured at 45 mm in the past, last was Dec 2012. 45 mm by TEE in 1/16.   He does not want this checked again in the future.    3. Cardiac pacemaker in situ  Notes: Functioning well. Routine checks.  Paroxysmal AFib noted.  Started Eliquis.  Discussed coumadin vs. Eliquis at length. 4. Combined hyperlipidemia  Continue Simvastatin Tablet, 10MG , TAKE 1 TABLET BY MOUTH ONCE A DAY EVERY EVENING Continue Niaspan Tablet Extended Release, 1000 MG, 2 tablets, Orally, Once a day Notes: Old Lipids reviewed.Well  controlled. 12/14: HDL 59. LDL 67. Obtain most recent results.  He occasionally uses grapefruit-rare, and does not want to give it up. 5. Edema: Elevate legs at night so the ankles are above the level of the heart. Information about 20-30 mmHg compression stockings, below the knee, given. We'll try to avoid diuretics.  Signed, Mina Marble, MD, [facility] 08/15/2014 8:58 AM

## 2014-08-15 NOTE — Patient Instructions (Signed)
Your physician recommends that you continue on your current medications as directed. Please refer to the Current Medication list given to you today.  A refill has been sent to [facility] for your Eliquis.  Your physician wants you to follow-up in: 1 year with Dr. Irish Lack.  You will receive a reminder letter in the mail two months in advance. If you don't receive a letter, please call our office to schedule the follow-up appointment.

## 2014-10-12 ENCOUNTER — Ambulatory Visit (INDEPENDENT_AMBULATORY_CARE_PROVIDER_SITE_OTHER): Payer: Medicare Other | Admitting: *Deleted

## 2014-10-12 ENCOUNTER — Encounter: Payer: Self-pay | Admitting: Internal Medicine

## 2014-10-12 DIAGNOSIS — I442 Atrioventricular block, complete: Secondary | ICD-10-CM

## 2014-10-12 NOTE — Progress Notes (Signed)
Remote pacemaker transmission.   

## 2014-10-19 LAB — CUP PACEART REMOTE DEVICE CHECK
Battery Voltage: 2.92 V
Date Time Interrogation Session: 20160518060722
Lead Channel Impedance Value: 400 Ohm
Lead Channel Pacing Threshold Amplitude: 1.25 V
Lead Channel Pacing Threshold Pulse Width: 0.5 ms
Lead Channel Pacing Threshold Pulse Width: 0.5 ms
Lead Channel Sensing Intrinsic Amplitude: 1.5 mV
Lead Channel Setting Pacing Amplitude: 1.5 V
MDC IDC MSMT BATTERY REMAINING LONGEVITY: 65 mo
MDC IDC MSMT BATTERY REMAINING PERCENTAGE: 58 %
MDC IDC MSMT LEADCHNL RA PACING THRESHOLD AMPLITUDE: 0.5 V
MDC IDC MSMT LEADCHNL RV IMPEDANCE VALUE: 440 Ohm
MDC IDC MSMT LEADCHNL RV SENSING INTR AMPL: 10 mV
MDC IDC SET LEADCHNL RV PACING AMPLITUDE: 1.5 V
MDC IDC SET LEADCHNL RV PACING PULSEWIDTH: 0.5 ms
MDC IDC SET LEADCHNL RV SENSING SENSITIVITY: 4 mV
MDC IDC STAT BRADY AP VP PERCENT: 94 %
MDC IDC STAT BRADY AP VS PERCENT: 1 %
MDC IDC STAT BRADY AS VP PERCENT: 5.9 %
MDC IDC STAT BRADY AS VS PERCENT: 1 %
MDC IDC STAT BRADY RA PERCENT PACED: 93 %
MDC IDC STAT BRADY RV PERCENT PACED: 99 %
Pulse Gen Model: 2210
Pulse Gen Serial Number: 7280216

## 2014-10-21 ENCOUNTER — Telehealth: Payer: Self-pay | Admitting: Interventional Cardiology

## 2014-10-21 ENCOUNTER — Other Ambulatory Visit: Payer: Self-pay | Admitting: *Deleted

## 2014-10-21 MED ORDER — NITROGLYCERIN 0.4 MG SL SUBL: 0.4000 mg | SUBLINGUAL_TABLET | SUBLINGUAL | Status: DC | PRN

## 2014-10-21 NOTE — Telephone Encounter (Signed)
New Prob   Pt is requesting a new prescription of Nitrostat as current prescription has expired.

## 2014-10-21 NOTE — Telephone Encounter (Signed)
Rx sent in for patient

## 2014-10-26 ENCOUNTER — Encounter: Payer: Self-pay | Admitting: Cardiology

## 2014-11-15 DIAGNOSIS — E119 Type 2 diabetes mellitus without complications: Secondary | ICD-10-CM | POA: Diagnosis not present

## 2014-12-29 ENCOUNTER — Inpatient Hospital Stay (HOSPITAL_COMMUNITY)
Admission: EM | Admit: 2014-12-29 | Discharge: 2014-12-30 | DRG: 292 | Disposition: A | Discharge status: Home / Self Care | Payer: Medicare Other | Attending: Cardiovascular Disease | Admitting: Cardiovascular Disease

## 2014-12-29 ENCOUNTER — Emergency Department (HOSPITAL_COMMUNITY): Payer: Medicare Other

## 2014-12-29 ENCOUNTER — Encounter (HOSPITAL_COMMUNITY): Payer: Self-pay | Admitting: *Deleted

## 2014-12-29 DIAGNOSIS — G629 Polyneuropathy, unspecified: Secondary | ICD-10-CM | POA: Diagnosis present

## 2014-12-29 DIAGNOSIS — I248 Other forms of acute ischemic heart disease: Secondary | ICD-10-CM | POA: Diagnosis present

## 2014-12-29 DIAGNOSIS — I509 Heart failure, unspecified: Secondary | ICD-10-CM | POA: Diagnosis not present

## 2014-12-29 DIAGNOSIS — I5031 Acute diastolic (congestive) heart failure: Principal | ICD-10-CM | POA: Diagnosis present

## 2014-12-29 DIAGNOSIS — E782 Mixed hyperlipidemia: Secondary | ICD-10-CM | POA: Diagnosis present

## 2014-12-29 DIAGNOSIS — I700 Atherosclerosis of aorta: Secondary | ICD-10-CM | POA: Diagnosis present

## 2014-12-29 DIAGNOSIS — Z7901 Long term (current) use of anticoagulants: Secondary | ICD-10-CM | POA: Diagnosis not present

## 2014-12-29 DIAGNOSIS — E1142 Type 2 diabetes mellitus with diabetic polyneuropathy: Secondary | ICD-10-CM | POA: Diagnosis present

## 2014-12-29 DIAGNOSIS — I712 Thoracic aortic aneurysm, without rupture, unspecified: Secondary | ICD-10-CM | POA: Diagnosis present

## 2014-12-29 DIAGNOSIS — I200 Unstable angina: Secondary | ICD-10-CM | POA: Insufficient documentation

## 2014-12-29 DIAGNOSIS — R011 Cardiac murmur, unspecified: Secondary | ICD-10-CM | POA: Diagnosis present

## 2014-12-29 DIAGNOSIS — I25110 Atherosclerotic heart disease of native coronary artery with unstable angina pectoris: Secondary | ICD-10-CM | POA: Diagnosis present

## 2014-12-29 DIAGNOSIS — D696 Thrombocytopenia, unspecified: Secondary | ICD-10-CM | POA: Diagnosis present

## 2014-12-29 DIAGNOSIS — I442 Atrioventricular block, complete: Secondary | ICD-10-CM | POA: Insufficient documentation

## 2014-12-29 DIAGNOSIS — E780 Pure hypercholesterolemia: Secondary | ICD-10-CM | POA: Diagnosis present

## 2014-12-29 DIAGNOSIS — Z950 Presence of cardiac pacemaker: Secondary | ICD-10-CM | POA: Diagnosis present

## 2014-12-29 DIAGNOSIS — Z888 Allergy status to other drugs, medicaments and biological substances status: Secondary | ICD-10-CM

## 2014-12-29 DIAGNOSIS — R05 Cough: Secondary | ICD-10-CM | POA: Diagnosis not present

## 2014-12-29 DIAGNOSIS — Z8249 Family history of ischemic heart disease and other diseases of the circulatory system: Secondary | ICD-10-CM

## 2014-12-29 DIAGNOSIS — Z79899 Other long term (current) drug therapy: Secondary | ICD-10-CM

## 2014-12-29 DIAGNOSIS — I480 Paroxysmal atrial fibrillation: Secondary | ICD-10-CM | POA: Diagnosis not present

## 2014-12-29 DIAGNOSIS — R0789 Other chest pain: Secondary | ICD-10-CM

## 2014-12-29 DIAGNOSIS — M4806 Spinal stenosis, lumbar region: Secondary | ICD-10-CM | POA: Diagnosis present

## 2014-12-29 DIAGNOSIS — I10 Essential (primary) hypertension: Secondary | ICD-10-CM | POA: Diagnosis present

## 2014-12-29 DIAGNOSIS — Z833 Family history of diabetes mellitus: Secondary | ICD-10-CM

## 2014-12-29 DIAGNOSIS — I080 Rheumatic disorders of both mitral and aortic valves: Secondary | ICD-10-CM | POA: Diagnosis present

## 2014-12-29 DIAGNOSIS — Z87891 Personal history of nicotine dependence: Secondary | ICD-10-CM | POA: Diagnosis not present

## 2014-12-29 DIAGNOSIS — Z8679 Personal history of other diseases of the circulatory system: Secondary | ICD-10-CM | POA: Diagnosis not present

## 2014-12-29 DIAGNOSIS — I2510 Atherosclerotic heart disease of native coronary artery without angina pectoris: Secondary | ICD-10-CM | POA: Diagnosis present

## 2014-12-29 DIAGNOSIS — R0600 Dyspnea, unspecified: Secondary | ICD-10-CM | POA: Diagnosis not present

## 2014-12-29 DIAGNOSIS — G4730 Sleep apnea, unspecified: Secondary | ICD-10-CM | POA: Diagnosis present

## 2014-12-29 DIAGNOSIS — R0602 Shortness of breath: Secondary | ICD-10-CM | POA: Diagnosis not present

## 2014-12-29 DIAGNOSIS — Z955 Presence of coronary angioplasty implant and graft: Secondary | ICD-10-CM | POA: Diagnosis not present

## 2014-12-29 DIAGNOSIS — G4700 Insomnia, unspecified: Secondary | ICD-10-CM | POA: Diagnosis present

## 2014-12-29 DIAGNOSIS — Z791 Long term (current) use of non-steroidal anti-inflammatories (NSAID): Secondary | ICD-10-CM

## 2014-12-29 DIAGNOSIS — I7121 Aneurysm of the ascending aorta, without rupture: Secondary | ICD-10-CM | POA: Insufficient documentation

## 2014-12-29 DIAGNOSIS — G2581 Restless legs syndrome: Secondary | ICD-10-CM | POA: Diagnosis present

## 2014-12-29 LAB — TROPONIN I
TROPONIN I: 0.05 ng/mL — AB (ref <0.031)
Troponin I: 0.07 ng/mL — ABNORMAL HIGH (ref <0.031)
Troponin I: 0.07 ng/mL — ABNORMAL HIGH (ref <0.031)

## 2014-12-29 LAB — BASIC METABOLIC PANEL
ANION GAP: 8 (ref 5–15)
BUN: 15 mg/dL (ref 6–20)
CO2: 23 mmol/L (ref 22–32)
Calcium: 8.9 mg/dL (ref 8.9–10.3)
Chloride: 107 mmol/L (ref 101–111)
Creatinine, Ser: 1.14 mg/dL (ref 0.61–1.24)
GFR calc non Af Amer: >60 mL/min (ref >60)
GLUCOSE: 109 mg/dL — AB (ref 65–99)
Potassium: 4 mmol/L (ref 3.5–5.1)
Sodium: 138 mmol/L (ref 135–145)

## 2014-12-29 LAB — CBC
HEMATOCRIT: 41.2 % (ref 39.0–52.0)
HEMOGLOBIN: 13.8 g/dL (ref 13.0–17.0)
MCH: 32.9 pg (ref 26.0–34.0)
MCHC: 33.5 g/dL (ref 30.0–36.0)
MCV: 98.3 fL (ref 78.0–100.0)
PLATELETS: 122 10*3/uL — AB (ref 150–400)
RBC: 4.19 MIL/uL — AB (ref 4.22–5.81)
RDW: 13.9 % (ref 11.5–15.5)
WBC: 6.6 10*3/uL (ref 4.0–10.5)

## 2014-12-29 LAB — BRAIN NATRIURETIC PEPTIDE: B Natriuretic Peptide: 660.8 pg/mL — ABNORMAL HIGH (ref 0.0–100.0)

## 2014-12-29 MED ORDER — FLUTICASONE PROPIONATE 50 MCG/ACT NA SUSP
1.0000 | Freq: Every day | NASAL | Status: DC
  Administered 2014-12-29 – 2014-12-30 (×2): 1 via NASAL
  Filled 2014-12-29: qty 16

## 2014-12-29 MED ORDER — NITROGLYCERIN 0.4 MG SL SUBL: 0.4000 mg | SUBLINGUAL_TABLET | SUBLINGUAL | Status: DC | PRN

## 2014-12-29 MED ORDER — VITAMIN D3 25 MCG (1000 UNIT) PO TABS
1000.0000 [IU] | ORAL_TABLET | Freq: Every day | ORAL | Status: DC
  Administered 2014-12-29 – 2014-12-30 (×2): 1000 [IU] via ORAL
  Filled 2014-12-29 (×2): qty 1

## 2014-12-29 MED ORDER — SIMVASTATIN 10 MG PO TABS
10.0000 mg | ORAL_TABLET | Freq: Every day | ORAL | Status: DC
  Administered 2014-12-29 – 2014-12-30 (×2): 10 mg via ORAL
  Filled 2014-12-29 (×2): qty 1

## 2014-12-29 MED ORDER — HEPARIN (PORCINE) IN NACL 100-0.45 UNIT/ML-% IJ SOLN
1200.0000 [IU]/h | INTRAMUSCULAR | Status: DC
  Administered 2014-12-29: 1000 [IU]/h via INTRAVENOUS
  Filled 2014-12-29 (×2): qty 250

## 2014-12-29 MED ORDER — NIACIN ER (ANTIHYPERLIPIDEMIC) 500 MG PO TBCR
2000.0000 mg | EXTENDED_RELEASE_TABLET | Freq: Every day | ORAL | Status: DC
  Administered 2014-12-29 – 2014-12-30 (×2): 2000 mg via ORAL
  Filled 2014-12-29 (×2): qty 4

## 2014-12-29 MED ORDER — FAMOTIDINE 10 MG PO TABS
10.0000 mg | ORAL_TABLET | Freq: Two times a day (BID) | ORAL | Status: DC
  Administered 2014-12-29 – 2014-12-30 (×2): 10 mg via ORAL
  Filled 2014-12-29 (×4): qty 1

## 2014-12-29 MED ORDER — PRAMIPEXOLE DIHYDROCHLORIDE 1 MG PO TABS
2.0000 mg | ORAL_TABLET | Freq: Every day | ORAL | Status: DC
  Administered 2014-12-29: 2 mg via ORAL
  Filled 2014-12-29 (×2): qty 2

## 2014-12-29 MED ORDER — METOPROLOL TARTRATE 25 MG PO TABS
25.0000 mg | ORAL_TABLET | Freq: Two times a day (BID) | ORAL | Status: DC
  Administered 2014-12-29 – 2014-12-30 (×2): 25 mg via ORAL
  Filled 2014-12-29 (×4): qty 1

## 2014-12-29 MED ORDER — FUROSEMIDE 10 MG/ML IJ SOLN
40.0000 mg | Freq: Once | INTRAMUSCULAR | Status: AC
  Administered 2014-12-29: 40 mg via INTRAVENOUS
  Filled 2014-12-29: qty 4

## 2014-12-29 MED ORDER — LEVOFLOXACIN 500 MG PO TABS
500.0000 mg | ORAL_TABLET | Freq: Once | ORAL | Status: AC
Start: 2014-12-29 — End: 2014-12-29
  Administered 2014-12-29: 500 mg via ORAL
  Filled 2014-12-29 (×2): qty 1

## 2014-12-29 MED ORDER — ACETAMINOPHEN 500 MG PO TABS: 500.0000 mg | ORAL_TABLET | Freq: Four times a day (QID) | ORAL | Status: DC | PRN

## 2014-12-29 MED ORDER — PANTOPRAZOLE SODIUM 40 MG PO TBEC
40.0000 mg | DELAYED_RELEASE_TABLET | Freq: Every day | ORAL | Status: DC
Start: 2014-12-29 — End: 2014-12-30
  Administered 2014-12-30: 40 mg via ORAL
  Filled 2014-12-29: qty 1

## 2014-12-29 MED ORDER — HEPARIN BOLUS VIA INFUSION
4000.0000 [IU] | Freq: Once | INTRAVENOUS | Status: AC
  Administered 2014-12-29: 4000 [IU] via INTRAVENOUS
  Filled 2014-12-29: qty 4000

## 2014-12-29 NOTE — H&P (Signed)
Chief Complaint:  Orthopnea, chest pain  Cardiologist: Noelle Penner (PPM)  HPI:  This is a 74 y.o. male with a past medical history significant for CAD (LAD atherectomy 1999), CHB s/p PM, PAF, small ascending aortic aneurysm presents with several days of reduced stamina, mild chest discomfort, mild worsening of bilateral ankle edema, culminating in frank orthopnea last night. CXR initially felt to represent pneumonia, appears more consistent with pulmonary edema. He denies fever or cough. Chest discomfort is pressure-like, but he thinks it is distinct from previous angina.  He has just returned from a trip to [place] and he has been less adherent to sodium restriction, eating some smoked fish, cheese and stopping at a [employer] on the road. Had some problems with diarrhea last week, now resolved  His ECG on arrival to the ED shows atrial fibrillation and V pacing. By the time of my evaluation, rhythm is ApVP. Interrogation of the pacemaker shows a marked increase in the burden of atrial fibrillation over the last few days (usually 1%, now 10-17%). Several episodes of atrial fibrillation occurred last night, up to 2.5 hours in duration.  Initial troponin I is borderline at 0.05.  Last EF assessment was by echo in January, EF 55%. Last ischemia assessment was PET scan [facility], Dr. Delman Cheadle November 2012. Low risk.  PMHx:  Past Medical History  Diagnosis Date  . Coronary artery disease   . Hypertension   . Pacemaker-[employer]es 07/16/2011  . Mixed hyperlipidemia   . Pure hypercholesterolemia   . Coronary atherosclerosis of unspecified type of vessel, native or graft   . Type II or unspecified type diabetes mellitus without mention of complication, not stated as uncontrolled   . Peripheral neuropathy   . Other postoperative functional disorders   . Insomnia, unspecified   . Unspecified sleep apnea   . Thoracic aneurysm without mention of rupture     Measured at 45 mm in the past.  Consider repeat MRA in 2015.  [name] Hyperglycemia   . Restless leg syndrome   . Ascending aorta dilatation     Fusiform dilatation ascending aorta 44 x 45 mm - 06/2007  . Heart murmur 04/25/09    Mild aortic calcification on echo 07/03/2009  . Spermatocele     near left testicle 2 cm  . Gilbert's syndrome   . Spinal stenosis, lumbar   . Thrombocytopenia     Averaging 140,000 since 2008  . Tubular adenoma 09/2012    Dr. Watt Climes repeat 4 years  . Complete heart block     Treated with pacemaker in 02/2011 - Dr. [name]    Past Surgical History  Procedure Laterality Date  . Coronary stent placement    . Pacermaker  07/16/11    Dr. [name]  . Hernia repair Bilateral   . Cholecystectomy  1988  . Appendectomy  1957  . Hemorroidectomy  1980  . Tee without cardioversion N/A 06/14/2014    Procedure: TRANSESOPHAGEAL ECHOCARDIOGRAM (TEE);  Surgeon: [name], MD;  Location: [facility] ENDOSCOPY;  Service: Cardiovascular;  Laterality: N/A;    FAMHx:  Family History  Problem Relation Age of Onset  . Cancer Mother     Small cell lung cancer  . Diabetes Father     DM  . CAD Father   . Hypertension Father   . Heart disease Father   . Hypertension Sister     SOCHx:   reports that he has quit smoking. He does not have any smokeless tobacco history on file.  He reports that he drinks alcohol. He reports that he does not use illicit drugs.  ALLERGIES:  Allergies  Allergen Reactions  . Benadryl [Diphenhydramine Hcl] Other (See Comments)    Adverse reaction, has very strange behavior. Also hyperactive.  [name] Antihistamines, Chlorpheniramine-Type Palpitations  . Restoril [Temazepam] Other (See Comments) and Palpitations    Hyperactive    ROS: The patient specifically denies syncope, palpitations, focal neurological deficits, intermittent claudication, unexplained weight gain, cough, hemoptysis or wheezing.  The patient also denies abdominal pain, nausea, vomiting, dysphagia, constipation, polyuria,  polydipsia, dysuria, hematuria, frequency, urgency, abnormal bleeding or bruising, fever, chills, unexpected weight changes, mood swings, change in skin or hair texture, change in voice quality, auditory or visual problems, allergic reactions or rashes, new musculoskeletal complaints other than usual "aches and pains".   HOME MEDS:  (Not in a hospital admission)  LABS/IMAGING: Results for orders placed or performed during the hospital encounter of 12/29/14 (from the past 48 hour(s))  Basic metabolic panel     Status: Abnormal   Collection Time: 12/29/14  9:45 AM  Result Value Ref Range   Sodium 138 135 - 145 mmol/L   Potassium 4.0 3.5 - 5.1 mmol/L   Chloride 107 101 - 111 mmol/L   CO2 23 22 - 32 mmol/L   Glucose, Bld 109 (H) 65 - 99 mg/dL   BUN 15 6 - 20 mg/dL   Creatinine, Ser 1.14 0.61 - 1.24 mg/dL   Calcium 8.9 8.9 - 10.3 mg/dL   GFR calc non Af Amer >60 >60 mL/min   GFR calc Af Amer >60 >60 mL/min    Comment: (NOTE) The eGFR has been calculated using the CKD EPI equation. This calculation has not been validated in all clinical situations. eGFR's persistently <60 mL/min signify possible Chronic Kidney Disease.    Anion gap 8 5 - 15  CBC     Status: Abnormal   Collection Time: 12/29/14  9:45 AM  Result Value Ref Range   WBC 6.6 4.0 - 10.5 K/uL   RBC 4.19 (L) 4.22 - 5.81 MIL/uL   Hemoglobin 13.8 13.0 - 17.0 g/dL   HCT 41.2 39.0 - 52.0 %   MCV 98.3 78.0 - 100.0 fL   MCH 32.9 26.0 - 34.0 pg   MCHC 33.5 30.0 - 36.0 g/dL   RDW 13.9 11.5 - 15.5 %   Platelets 122 (L) 150 - 400 K/uL  Troponin I     Status: Abnormal   Collection Time: 12/29/14  9:45 AM  Result Value Ref Range   Troponin I 0.05 (H) <0.031 ng/mL    Comment:        PERSISTENTLY INCREASED TROPONIN VALUES IN THE RANGE OF 0.04-0.49 ng/mL CAN BE SEEN IN:       -UNSTABLE ANGINA       -CONGESTIVE HEART FAILURE       -MYOCARDITIS       -CHEST TRAUMA       -ARRYHTHMIAS       -LATE PRESENTING MYOCARDIAL  INFARCTION       -COPD   CLINICAL FOLLOW-UP RECOMMENDED.   Brain natriuretic peptide     Status: Abnormal   Collection Time: 12/29/14  9:45 AM  Result Value Ref Range   B Natriuretic Peptide 660.8 (H) 0.0 - 100.0 pg/mL   Dg Chest 2 View  12/29/2014   ADDENDUM REPORT: 12/29/2014 11:03  ADDENDUM: This exam was subsequently reviewed and discussed with Dr. Venora Maples. Bibasilar interstitial infiltrates are seen with associated Kerley B-lines,  which favors bibasilar interstitial edema over pneumonia. No evidence of pulmonary consolidation. Small bilateral pleural effusions are present. These findings favor mild congestive heart failure over bibasilar pneumonia.   Electronically Signed   By: Earle Gell M.D.   On: 12/29/2014 11:03   12/29/2014   CLINICAL DATA:  Shortness of breath, lying down at night. Shortness of breath is getting worse in general. Blood pressure increases at night. Chest tightness, cough for 1 year. Untreated aspiration.  EXAM: CHEST  2 VIEW  COMPARISON:  04/18/2011  FINDINGS: Left-sided transvenous pacemaker leads to the right atrium and right ventricle. Heart is mildly enlarged. Patchy density identified within the right middle lobe and lateral aspects of both lower lobes, consistent with infectious infiltrate. Small bilateral pleural effusions. No pulmonary edema. Mild mid thoracic spondylosis.  IMPRESSION: 1. Cardiomegaly.  No edema. 2. Bilateral pleural effusions. 3. Right middle lobe and bilateral lower lobe infiltrates.  Electronically Signed: By: Nolon Nations M.D. On: 12/29/2014 09:32    VITALS: Blood pressure 104/38, pulse 60, temperature 97.7 F (36.5 C), temperature source Oral, resp. rate 16, height 6' (1.829 m), weight 180 lb (81.647 kg), SpO2 95 %.  EXAM:  General: Alert, oriented x3, no distress Head: no evidence of trauma, PERRL, EOMI, no exophtalmos or lid lag, no myxedema, no xanthelasma; normal ears, nose and oropharynx Neck: 5-6 cm jugular venous pulsations and  no hepatojugular reflux; brisk carotid pulses without delay and no carotid bruits Chest: clear to auscultation, no signs of consolidation by percussion or palpation, normal fremitus, symmetrical and full respiratory excursions Cardiovascular: normal position and quality of the apical impulse, regular rhythm, normal first heart sound and paradoxically split second heart sounds, no rubs, + S4 gallop, faint aortic ejection murmur Abdomen: no tenderness or distention, no masses by palpation, no abnormal pulsatility or arterial bruits, normal bowel sounds, no hepatosplenomegaly Extremities: no clubbing, cyanosis; 1+ retromalleolar symmetrical edema; 2+ radial, ulnar and brachial pulses bilaterally; 2+ right femoral, posterior tibial and dorsalis pedis pulses; 2+ left femoral, posterior tibial and dorsalis pedis pulses; no subclavian or femoral bruits Neurological: grossly nonfocal  ECG atrial fibrillation  100%V paced  IMPRESSION: New onset acute heart failure, diastolic (last EF 19% in January 2016) in a patient with known CAD, atypical chest discomfort and recent marked increase in AF burden. Unclear if the arrhythmia is cause of or consequence of HF exacerbation. Pacing precludes use of ECG for rapid evaluation of ischemia and initial cardiac enzymes are borderline.  PLAN:  Admit. Diuretics Telemetry Echo If LVEF and wall motion are normal, recommend Lexiscan Myoview. If wall motion abnormalities or reduction in EF are identified, recommend proceeding directly to coronary angiography (also if troponin rises). If AF burden diminishes with treatment of CHF, antiarrhythmics are not indicated. If, however, atrial fibrillation appears to be the cause of his decompensation, consider dofetilide or amiodarone.    Sanda Klein, MD, [facility] [phone] office [phone] pager  12/29/2014, 12:06 PM

## 2014-12-29 NOTE — ED Notes (Signed)
Pt reports heaviness in his chest for several weeks. Pt states that it is constant and worsens with exertion. Pt reports SOB and dizziness as well.

## 2014-12-29 NOTE — ED Provider Notes (Signed)
CSN: [id number]     Arrival date & time 12/29/14  0844 History   First MD Initiated Contact with Patient 12/29/14 [phone]     Chief Complaint  Patient presents with  . Chest Pain     HPI Patient presents with mild chest pressure and shortness of breath last night.  He's also had increasing cough of the past 4 days without fever.  He states over the past several weeks his had generalized fatigue and feels like he doesn't have the same energy he normally does.  He reports he walked in the emergency department did not feel short of breath.  He denies true orthopnea.  He does report that last night he was up most the evening because he just could not get comfortable.  He does have a history of coronary artery disease.  He has coronary stents from 1999.  No prior history of congestive heart failure.  He has an echocardiogram from early 2016 which demonstrated only mild aortic and mitral regurg with a normal EF.  Patient is on anticoagulation chronically.  He is compliant with his medications.  No melena or hematochezia   Past Medical History  Diagnosis Date  . Coronary artery disease   . Hypertension   . Pacemaker-[employer] 07/16/2011  . Mixed hyperlipidemia   . Pure hypercholesterolemia   . Coronary atherosclerosis of unspecified type of vessel, native or graft   . Type II or unspecified type diabetes mellitus without mention of complication, not stated as uncontrolled   . Peripheral neuropathy   . Other postoperative functional disorders   . Insomnia, unspecified   . Unspecified sleep apnea   . Thoracic aneurysm without mention of rupture     Measured at 45 mm in the past. Consider repeat MRA in 2015.  [name] Hyperglycemia   . Restless leg syndrome   . Ascending aorta dilatation     Fusiform dilatation ascending aorta 44 x 45 mm - 06/2007  . Heart murmur 04/25/09    Mild aortic calcification on echo 07/03/2009  . Spermatocele     near left testicle 2 cm  . Gilbert's syndrome   . Spinal stenosis,  lumbar   . Thrombocytopenia     Averaging 140,000 since 2008  . Tubular adenoma 09/2012    Dr. Watt Climes repeat 4 years  . Complete heart block     Treated with pacemaker in 02/2011 - Dr. [name]   Past Surgical History  Procedure Laterality Date  . Coronary stent placement    . Pacermaker  07/16/11    Dr. [name]  . Hernia repair Bilateral   . Cholecystectomy  1988  . Appendectomy  1957  . Hemorroidectomy  1980  . Tee without cardioversion N/A 06/14/2014    Procedure: TRANSESOPHAGEAL ECHOCARDIOGRAM (TEE);  Surgeon: [name], MD;  Location: [facility] ENDOSCOPY;  Service: Cardiovascular;  Laterality: N/A;   Family History  Problem Relation Age of Onset  . Cancer Mother     Small cell lung cancer  . Diabetes Father     DM  . CAD Father   . Hypertension Father   . Heart disease Father   . Hypertension Sister    History  Substance Use Topics  . Smoking status: Former Research scientist (life sciences)  . Smokeless tobacco: Not on file  . Alcohol Use: Yes     Comment: occasional glass of wine    Review of Systems  All other systems reviewed and are negative.     Allergies  Benadryl; Antihistamines, chlorpheniramine-type; and  Restoril  Home Medications   Prior to Admission medications   Medication Sig Start Date End Date Taking? Authorizing Provider  acetaminophen (TYLENOL) 500 MG tablet Take 500 mg by mouth every 6 (six) hours as needed for mild pain.   Yes Historical Provider, MD  apixaban (ELIQUIS) 5 MG TABS tablet Take 1 tablet (5 mg total) by mouth 2 (two) times daily. 08/15/14  Yes Jettie Booze, MD  Chlorphen-Pseudoephed-APAP (CORICIDIN D PO) Take 1 tablet by mouth daily as needed (allergy symptoms).   Yes Historical Provider, MD  cholecalciferol (VITAMIN D) 1000 UNITS tablet Take 1,000 Units by mouth daily.   Yes Historical Provider, MD  Coenzyme Q10 (COQ10) 100 MG CAPS Take 100 mg by mouth daily.    Yes Historical Provider, MD  Cyanocobalamin (VITAMIN B-12) 2500 MCG SUBL Place 1 tablet under  the tongue daily.   Yes Historical Provider, MD  famotidine (PEPCID) 10 MG tablet Take 10 mg by mouth 2 (two) times daily.    Yes Historical Provider, MD  fluticasone (FLONASE) 50 MCG/ACT nasal spray Place 1 spray into both nostrils daily.   Yes Historical Provider, MD  loratadine-pseudoephedrine (CLARITIN-D 24-HOUR) 10-240 MG per 24 hr tablet Take 1 tablet by mouth daily.   Yes Historical Provider, MD  metoprolol (LOPRESSOR) 50 MG tablet Take 25 mg by mouth 2 (two) times daily.    Yes Historical Provider, MD  Multiple Vitamin (MULTIVITAMIN) capsule TAKE 1 CAPSULE THREE TIMES A WEEK   Yes Historical Provider, MD  naproxen (NAPROSYN) 500 MG tablet Take 500 mg by mouth 2 (two) times daily as needed for mild pain.   Yes Historical Provider, MD  niacin (NIASPAN) 1000 MG CR tablet Take 2,000 mg by mouth daily.   Yes Historical Provider, MD  nitroGLYCERIN (NITROSTAT) 0.4 MG SL tablet Place 1 tablet (0.4 mg total) under the tongue every 5 (five) minutes as needed. For chest pain 10/21/14  Yes Jettie Booze, MD  Omega-3 Fatty Acids (FISH OIL PO) 1 capsule with a meal   Yes Historical Provider, MD  omeprazole (PRILOSEC) 20 MG capsule Take 20 mg by mouth daily as needed. For acid reflux    Yes Historical Provider, MD  pramipexole (MIRAPEX) 1 MG tablet Take 2 mg by mouth at bedtime.     Yes Historical Provider, MD  simvastatin (ZOCOR) 10 MG tablet Take 10 mg by mouth daily.     Yes Historical Provider, MD  vitamin C (ASCORBIC ACID) 500 MG tablet Take 500 mg by mouth daily.   Yes Historical Provider, MD   BP 106/41 mmHg  Pulse 70  Temp(Src) 97.7 F (36.5 C) (Oral)  Resp 15  Ht 6' (1.829 m)  Wt 180 lb (81.647 kg)  BMI 24.41 kg/m2  SpO2 94% Physical Exam  Constitutional: He is oriented to person, place, and time. He appears well-developed and well-nourished.  HENT:  Head: Normocephalic and atraumatic.  Eyes: EOM are normal.  Neck: Normal range of motion.  Cardiovascular: Normal rate, regular  rhythm, normal heart sounds and intact distal pulses.   Pulmonary/Chest: Effort normal and breath sounds normal. No respiratory distress.  Abdominal: Soft. He exhibits no distension. There is no tenderness.  Musculoskeletal: Normal range of motion.  Neurological: He is alert and oriented to person, place, and time.  Skin: Skin is warm and dry.  Psychiatric: He has a normal mood and affect. Judgment normal.  Nursing note and vitals reviewed.   ED Course  Procedures (including critical care time) Labs Review  Labs Reviewed  BASIC METABOLIC PANEL - Abnormal; Notable for the following:    Glucose, Bld 109 (*)    All other components within normal limits  CBC - Abnormal; Notable for the following:    RBC 4.19 (*)    Platelets 122 (*)    All other components within normal limits  TROPONIN I - Abnormal; Notable for the following:    Troponin I 0.05 (*)    All other components within normal limits  BRAIN NATRIURETIC PEPTIDE - Abnormal; Notable for the following:    B Natriuretic Peptide 660.8 (*)    All other components within normal limits    Imaging Review Dg Chest 2 View  12/29/2014   ADDENDUM REPORT: 12/29/2014 11:03  ADDENDUM: This exam was subsequently reviewed and discussed with Dr. Venora Maples. Bibasilar interstitial infiltrates are seen with associated Kerley B-lines, which favors bibasilar interstitial edema over pneumonia. No evidence of pulmonary consolidation. Small bilateral pleural effusions are present. These findings favor mild congestive heart failure over bibasilar pneumonia.   Electronically Signed   By: Earle Gell M.D.   On: 12/29/2014 11:03   12/29/2014   CLINICAL DATA:  Shortness of breath, lying down at night. Shortness of breath is getting worse in general. Blood pressure increases at night. Chest tightness, cough for 1 year. Untreated aspiration.  EXAM: CHEST  2 VIEW  COMPARISON:  04/18/2011  FINDINGS: Left-sided transvenous pacemaker leads to the right atrium and right  ventricle. Heart is mildly enlarged. Patchy density identified within the right middle lobe and lateral aspects of both lower lobes, consistent with infectious infiltrate. Small bilateral pleural effusions. No pulmonary edema. Mild mid thoracic spondylosis.  IMPRESSION: 1. Cardiomegaly.  No edema. 2. Bilateral pleural effusions. 3. Right middle lobe and bilateral lower lobe infiltrates.  Electronically Signed: By: Nolon Nations M.D. On: 12/29/2014 09:32  I personally reviewed the imaging tests through PACS system I reviewed available ER/hospitalization records through the EMR    EKG Interpretation   Date/Time:  Thursday December 29 2014 08:47:36 EDT Ventricular Rate:  70 PR Interval:    QRS Duration: 180 QT Interval:  490 QTC Calculation: 529 R Axis:   -91 Text Interpretation:  Ventricular-paced rhythm Abnormal ECG No significant  change was found Confirmed by Wave Calzada  MD, Shama Monfils ([id number]) on 12/29/2014  11:20:35 AM      MDM   Final diagnoses:  Congestive heart failure, unspecified congestive heart failure chronicity, unspecified congestive heart failure type  Chest pressure    Initially chest x-ray was read as infiltrates consistent with pneumonia.  Patient was given Levaquin.  His story however is a little bit more concerning for cardiac etiology with some chest pressure and shortness of breath.  Over read of the x-ray seemed more consistent with patchy edema bilaterally and probable Kerley B-lines.   Given known history of coronary artery disease the patient be admitted the hospital for ongoing cardiac workup.     Jola Schmidt, MD 12/29/14 1120

## 2014-12-29 NOTE — Progress Notes (Signed)
ANTICOAGULATION CONSULT NOTE - Initial Consult  Pharmacy Consult for heparin Indication: atrial fibrillation  Allergies  Allergen Reactions  . Benadryl [Diphenhydramine Hcl] Other (See Comments)    Adverse reaction, has very strange behavior. Also hyperactive.  [name] Antihistamines, Chlorpheniramine-Type Palpitations  . Restoril [Temazepam] Other (See Comments) and Palpitations    Hyperactive    Patient Measurements: Height: 6' (182.9 cm) Weight: 180 lb (81.647 kg) IBW/kg (Calculated) : 77.6 Heparin Dosing Weight: 81.6 kg  Vital Signs: Temp: 97.7 F (36.5 C) (08/04 0848) Temp Source: Oral (08/04 0848) BP: 110/56 mmHg (08/04 1500) Pulse Rate: 68 (08/04 1500)  Labs:  Recent Labs  12/29/14 0945  HGB 13.8  HCT 41.2  PLT 122*  CREATININE 1.14  TROPONINI 0.05*    Estimated Creatinine Clearance: 60.5 mL/min (by C-G formula based on Cr of 1.14).   Medical History: Past Medical History  Diagnosis Date  . Coronary artery disease   . Hypertension   . Pacemaker-[employer] 07/16/2011  . Mixed hyperlipidemia   . Pure hypercholesterolemia   . Coronary atherosclerosis of unspecified type of vessel, native or graft   . Type II or unspecified type diabetes mellitus without mention of complication, not stated as uncontrolled   . Peripheral neuropathy   . Other postoperative functional disorders   . Insomnia, unspecified   . Unspecified sleep apnea   . Thoracic aneurysm without mention of rupture     Measured at 45 mm in the past. Consider repeat MRA in 2015.  [name] Hyperglycemia   . Restless leg syndrome   . Ascending aorta dilatation     Fusiform dilatation ascending aorta 44 x 45 mm - 06/2007  . Heart murmur 04/25/09    Mild aortic calcification on echo 07/03/2009  . Spermatocele     near left testicle 2 cm  . Gilbert's syndrome   . Spinal stenosis, lumbar   . Thrombocytopenia     Averaging 140,000 since 2008  . Tubular adenoma 09/2012    Dr. Watt Climes repeat 4 years  . Complete  heart block     Treated with pacemaker in 02/2011 - Dr. [name]    Medications:   (Not in a hospital admission)  Assessment: 74 yo male admitted with chest discomfort. Found to be in AFib with ventricular pacing. Pt is on Eliquis PTA, his last dose was yesterday evening. CBC stable, mildly low plts, but they appear to run low at baseline. Will transition the pt to heparin gtt while the pt is being evaluated.  Goal of Therapy:  aptt 66-102 Heparin level 0.3-0.7 units/ml Monitor platelets by anticoagulation protocol: Yes   Plan:  Heparin 4000 units x1 then 1000 units/hr Daily HL, CBC Check aptt + HL until levels correlate aptt tonight  Monitor s/sx bleeding    Hughes Better, PharmD, BCPS Clinical Pharmacist Pager: [phone] 12/29/2014 4:04 PM

## 2014-12-29 NOTE — ED Notes (Signed)
Cardiology at the bedside.

## 2014-12-30 ENCOUNTER — Inpatient Hospital Stay (HOSPITAL_COMMUNITY): Payer: Medicare Other

## 2014-12-30 DIAGNOSIS — R0600 Dyspnea, unspecified: Secondary | ICD-10-CM

## 2014-12-30 DIAGNOSIS — I5031 Acute diastolic (congestive) heart failure: Principal | ICD-10-CM

## 2014-12-30 LAB — BASIC METABOLIC PANEL
ANION GAP: 8 (ref 5–15)
BUN: 17 mg/dL (ref 6–20)
CO2: 27 mmol/L (ref 22–32)
Calcium: 8.9 mg/dL (ref 8.9–10.3)
Chloride: 103 mmol/L (ref 101–111)
Creatinine, Ser: 1.28 mg/dL — ABNORMAL HIGH (ref 0.61–1.24)
GFR calc Af Amer: >60 mL/min (ref >60)
GFR, EST NON AFRICAN AMERICAN: 53 mL/min — AB (ref >60)
Glucose, Bld: 124 mg/dL — ABNORMAL HIGH (ref 65–99)
Potassium: 3.6 mmol/L (ref 3.5–5.1)
SODIUM: 138 mmol/L (ref 135–145)

## 2014-12-30 LAB — CBC
HCT: 38.1 % — ABNORMAL LOW (ref 39.0–52.0)
Hemoglobin: 13.2 g/dL (ref 13.0–17.0)
MCH: 33.3 pg (ref 26.0–34.0)
MCHC: 34.6 g/dL (ref 30.0–36.0)
MCV: 96.2 fL (ref 78.0–100.0)
PLATELETS: 124 10*3/uL — AB (ref 150–400)
RBC: 3.96 MIL/uL — ABNORMAL LOW (ref 4.22–5.81)
RDW: 13.6 % (ref 11.5–15.5)
WBC: 6.7 10*3/uL (ref 4.0–10.5)

## 2014-12-30 LAB — TROPONIN I: Troponin I: 0.07 ng/mL — ABNORMAL HIGH (ref <0.031)

## 2014-12-30 LAB — HEPARIN LEVEL (UNFRACTIONATED)
HEPARIN UNFRACTIONATED: 0.35 [IU]/mL (ref 0.30–0.70)
Heparin Unfractionated: 0.24 IU/mL — ABNORMAL LOW (ref 0.30–0.70)

## 2014-12-30 LAB — APTT: APTT: 39 s — AB (ref 24–37)

## 2014-12-30 MED ORDER — FUROSEMIDE 40 MG PO TABS: 40.0000 mg | ORAL_TABLET | Freq: Every day | ORAL | Status: DC

## 2014-12-30 MED ORDER — POTASSIUM CHLORIDE ER 10 MEQ PO TBCR: 10.0000 meq | EXTENDED_RELEASE_TABLET | Freq: Every day | ORAL | Status: DC

## 2014-12-30 NOTE — Progress Notes (Signed)
  Echocardiogram 2D Echocardiogram has been performed.  David [name] 12/30/2014, 2:47 PM

## 2014-12-30 NOTE — Progress Notes (Signed)
2000 seen by PA Hager . With D/C orders Discharge instructions given to pt abnd wife ,Verbalized understanding

## 2014-12-30 NOTE — Progress Notes (Signed)
ANTICOAGULATION CONSULT NOTE - Follow Up Consult  Pharmacy Consult for Heparin  Indication: atrial fibrillation  Allergies  Allergen Reactions  . Benadryl [Diphenhydramine Hcl] Other (See Comments)    Adverse reaction, has very strange behavior. Also hyperactive.  [name] Antihistamines, Chlorpheniramine-Type Palpitations  . Restoril [Temazepam] Other (See Comments) and Palpitations    Hyperactive    Patient Measurements: Height: 6' (182.9 cm) Weight: 173 lb 14.4 oz (78.881 kg) IBW/kg (Calculated) : 77.6  Vital Signs: Temp: 97.9 F (36.6 C) (08/05 0148) Temp Source: Oral (08/05 0148) BP: 129/65 mmHg (08/05 0148) Pulse Rate: 58 (08/05 0148)  Labs:  Recent Labs  12/29/14 0945 12/29/14 1548 12/29/14 2213 12/30/14 0308 12/30/14 0316  HGB 13.8  --   --   --  13.2  HCT 41.2  --   --   --  38.1*  PLT 122*  --   --   --  124*  APTT  --   --   --  39*  --   HEPARINUNFRC  --   --   --  0.35  --   CREATININE 1.14  --   --   --  1.28*  TROPONINI 0.05* 0.07* 0.07*  --  0.07*    Estimated Creatinine Clearance: 53.9 mL/min (by C-G formula based on Cr of 1.28).   Assessment: Initial heparin level is therapeutic at 0.35, was on apixaban PTA, appears we can use heparin level to dose, other labs reviewed.   Goal of Therapy:  Heparin level 0.3-0.7 units/ml Monitor platelets by anticoagulation protocol: Yes   Plan:  -Continue heparin at 1000 units/hr -1100 HL -Daily CBC/HL -Monitor for bleeding  Narda Bonds 12/30/2014,4:26 AM

## 2014-12-30 NOTE — Progress Notes (Signed)
PHARMACY NOTE  Consult :  Heparin Indication :  atrial fibrillation   Heparin Dosing Wt :  79 kg  LABS :  Recent Labs  12/29/14 0945 12/30/14 0308 12/30/14 0316 12/30/14 1106  HGB 13.8  --  13.2  --   HCT 41.2  --  38.1*  --   PLT 122*  --  124*  --   APTT  --  39*  --   --   HEPARINUNFRC  --  0.35  --  0.24*  CREATININE 1.14  --  1.28*  --     MEDICATION: Medication PTA: Prescriptions prior to admission  Medication Sig Dispense Refill Last Dose  . acetaminophen (TYLENOL) 500 MG tablet Take 500 mg by mouth every 6 (six) hours as needed for mild pain.   12/28/2014 at Unknown time  . apixaban (ELIQUIS) 5 MG TABS tablet Take 1 tablet (5 mg total) by mouth 2 (two) times daily. 60 tablet 1 12/28/2014 at Unknown time  . Chlorphen-Pseudoephed-APAP (CORICIDIN D PO) Take 1 tablet by mouth daily as needed (allergy symptoms).   12/28/2014 at Unknown time  . cholecalciferol (VITAMIN D) 1000 UNITS tablet Take 1,000 Units by mouth daily.   12/28/2014 at Unknown time  . Coenzyme Q10 (COQ10) 100 MG CAPS Take 100 mg by mouth daily.    12/28/2014 at Unknown time  . Cyanocobalamin (VITAMIN B-12) 2500 MCG SUBL Place 1 tablet under the tongue daily.   12/28/2014 at Unknown time  . famotidine (PEPCID) 10 MG tablet Take 10 mg by mouth 2 (two) times daily.    12/28/2014 at Unknown time  . fluticasone (FLONASE) 50 MCG/ACT nasal spray Place 1 spray into both nostrils daily.   12/28/2014 at Unknown time  . loratadine-pseudoephedrine (CLARITIN-D 24-HOUR) 10-240 MG per 24 hr tablet Take 1 tablet by mouth daily.   12/28/2014 at Unknown time  . metoprolol (LOPRESSOR) 50 MG tablet Take 25 mg by mouth 2 (two) times daily.    12/28/2014 at 2000  . Multiple Vitamin (MULTIVITAMIN) capsule TAKE 1 CAPSULE THREE TIMES A WEEK   Past Week at Unknown time  . naproxen (NAPROSYN) 500 MG tablet Take 500 mg by mouth 2 (two) times daily as needed for mild pain.   Past Month at Unknown time  . niacin (NIASPAN)  1000 MG CR tablet Take 2,000 mg by mouth daily.   12/28/2014 at Unknown time  . nitroGLYCERIN (NITROSTAT) 0.4 MG SL tablet Place 1 tablet (0.4 mg total) under the tongue every 5 (five) minutes as needed. For chest pain 25 tablet 5 unk at unk  . Omega-3 Fatty Acids (FISH OIL PO) 1 capsule with a meal   12/28/2014 at Unknown time  . omeprazole (PRILOSEC) 20 MG capsule Take 20 mg by mouth daily as needed. For acid reflux    Past Month at Unknown time  . pramipexole (MIRAPEX) 1 MG tablet Take 2 mg by mouth at bedtime.     12/28/2014 at Unknown time  . simvastatin (ZOCOR) 10 MG tablet Take 10 mg by mouth daily.     12/28/2014 at Unknown time  . vitamin C (ASCORBIC ACID) 500 MG tablet Take 500 mg by mouth daily.   12/28/2014 at Unknown time   Scheduled:  Scheduled:  . cholecalciferol  1,000 Units Oral Daily  . famotidine  10 mg Oral BID  . fluticasone  1 spray Each Nare Daily  . metoprolol  25 mg Oral BID  . niacin  2,000 mg Oral Daily  .  pantoprazole  40 mg Oral Daily  . pramipexole  2 mg Oral QHS  . simvastatin  10 mg Oral Daily   Infusion[s]: Infusions:  . heparin 1,000 Units/hr (12/29/14 1623)   ASSESSMENT :  74 y.o. male is currently on Heparin infujsion for atrial fibrillation in patient on Apixaban PTA.   Heparin infusing at 1000 units/hr. Heparin level dropped to 0.24 units/ml.  With Heparin level low, likely Apixaban's influence on Heparin levels no longer a factor.  No evidence of bleeding complications observed.  GOAL :  Heparin Level  0.3 - 0.7 units/ml  PLAN : 1. Heparin will be increased to 1200 units/hr.    2. Will check the next Heparin Level in 8 hours.  Will draw aPTT at same time to verify Heparin Level's accuracy. 3. Daily Heparin level, CBC while on Heparin.  Monitor for bleeding complications. Follow Platelet counts.  Marthenia Rolling,  Pharm.D   12/30/2014,  12:35 PM

## 2014-12-30 NOTE — Progress Notes (Signed)
Patient Name: David [name] Date of Encounter: 12/30/2014  Principal Problem:   Congestive heart disease Active Problems:   Pacemaker-[employer]   Coronary atherosclerosis of native coronary artery   Aneurysm of thoracic aorta   Anticoagulation adequate with anticoagulant therapy   Paroxysmal atrial fibrillation   Coronary artery disease involving native coronary artery of native heart with unstable angina pectoris   Acute heart failure   Primary Cardiologist: Dr Irish Lack, Dr Lovena Le  Patient Profile: 74 yo male w/ hx CAD, CHB w/ PPM, small asc Ao aneurysm who was admitted 08/04 w/ volume overload and afib.  SUBJECTIVE: Breathing much better, no chest pain. Aware he was going in/out of afib recently, but no presyncope. Not on diuretic at home. Ankles had been swelling and c/o DOE before admission.  OBJECTIVE Filed Vitals:   12/29/14 1737 12/29/14 2211 12/30/14 0148 12/30/14 0621  BP: 137/68 107/62 129/65 132/65  Pulse: 76 60 58 59  Temp: 97.7 F (36.5 C) 97.5 F (36.4 C) 97.9 F (36.6 C) 98.3 F (36.8 C)  TempSrc:  Oral Oral Oral  Resp: 16 18 16 18   Height: 6' (1.829 m)     Weight: 173 lb 14.4 oz (78.881 kg)   174 lb (78.926 kg)  SpO2: 97% 93% 95% 95%    Intake/Output Summary (Last 24 hours) at 12/30/14 1114 Last data filed at 12/30/14 0624  Gross per 24 hour  Intake    420 ml  Output   3200 ml  Net  -2780 ml   Filed Weights   12/29/14 0848 12/29/14 1737 12/30/14 0621  Weight: 180 lb (81.647 kg) 173 lb 14.4 oz (78.881 kg) 174 lb (78.926 kg)    PHYSICAL EXAM General: Well developed, well nourished, male in no acute distress. Head: Normocephalic, atraumatic.  Neck: Supple without bruits, JVD 7-8 cm. Lungs:  Resp regular and unlabored, rales bases. Heart: RRR, S1, S2, no S3, S4, 2/6 murmur; no rub. Abdomen: Soft, non-tender, non-distended, BS + x 4.  Extremities: No clubbing, cyanosis, edema.  Neuro: Alert and oriented X 3. Moves all extremities  spontaneously. Psych: Normal affect.  LABS: CBC: Recent Labs  12/29/14 0945 12/30/14 0316  WBC 6.6 6.7  HGB 13.8 13.2  HCT 41.2 38.1*  MCV 98.3 96.2  PLT 122* 354*   Basic Metabolic Panel: Recent Labs  12/29/14 0945 12/30/14 0316  NA 138 138  K 4.0 3.6  CL 107 103  CO2 23 27  GLUCOSE 109* 124*  BUN 15 17  CREATININE 1.14 1.28*  CALCIUM 8.9 8.9   Cardiac Enzymes: Recent Labs  12/29/14 1548 12/29/14 2213 12/30/14 0316  TROPONINI 0.07* 0.07* 0.07*   BNP:  B NATRIURETIC PEPTIDE  Date/Time Value Ref Range Status  12/29/2014 09:45 AM 660.8* 0.0 - 100.0 pg/mL Final   TELE:  AV pacing, HR rapid at times      Radiology/Studies: Dg Chest 2 View 12/29/2014   ADDENDUM REPORT: 12/29/2014 11:03  ADDENDUM: This exam was subsequently reviewed and discussed with Dr. Venora Maples. Bibasilar interstitial infiltrates are seen with associated Kerley B-lines, which favors bibasilar interstitial edema over pneumonia. No evidence of pulmonary consolidation. Small bilateral pleural effusions are present. These findings favor mild congestive heart failure over bibasilar pneumonia.   Electronically Signed   By: Earle Gell M.D.   On: 12/29/2014 11:03   12/29/2014   CLINICAL DATA:  Shortness of breath, lying down at night. Shortness of breath is getting worse in general. Blood pressure increases at night. Chest  tightness, cough for 1 year. Untreated aspiration.  EXAM: CHEST  2 VIEW  COMPARISON:  04/18/2011  FINDINGS: Left-sided transvenous pacemaker leads to the right atrium and right ventricle. Heart is mildly enlarged. Patchy density identified within the right middle lobe and lateral aspects of both lower lobes, consistent with infectious infiltrate. Small bilateral pleural effusions. No pulmonary edema. Mild mid thoracic spondylosis.  IMPRESSION: 1. Cardiomegaly.  No edema. 2. Bilateral pleural effusions. 3. Right middle lobe and bilateral lower lobe infiltrates.  Electronically Signed: By: Nolon Nations M.D. On: 12/29/2014 09:32     Current Medications:  . cholecalciferol  1,000 Units Oral Daily  . famotidine  10 mg Oral BID  . fluticasone  1 spray Each Nare Daily  . metoprolol  25 mg Oral BID  . niacin  2,000 mg Oral Daily  . pantoprazole  40 mg Oral Daily  . pramipexole  2 mg Oral QHS  . simvastatin  10 mg Oral Daily   . heparin 1,000 Units/hr (12/29/14 1623)    ASSESSMENT AND PLAN: Principal Problem:   Congestive heart disease (previous EF 55%) - I/O neg,  Symptomatically improved - ambulate and see how tolerated - get echo, reassess EF and PAS  Active Problems:   Pacemaker-[employer] Jude - pacing     Coronary atherosclerosis of native coronary artery - minimal elevation in troponin with no clear crescendo/decrescendo pattern - no true ischemic symptoms - feel demand ischemia from afib/chf - no further workup indicated    Aneurysm of thoracic aorta - f/u as scheduled    Anticoagulation adequate with anticoagulant therapy - was on Eliquis prior to admission    Paroxysmal atrial fibrillation - currently HR stable w/ V pacing.    Coronary artery disease involving native coronary artery of native heart with unstable angina pectoris - see above   Signed, Barrett, Rhonda , PA-C 11:14 AM 12/30/2014  I have personally seen and examined this patient with Rosaria Ferries, PA-C. I agree with the assessment and plan as outlined above. He was volume overloaded and is now feeling better after diuresis. Will change to po Lasix today. Will plan echo today. If LV function is normal then will d/c home with f/u with dr. Irish Lack. I would give him Lasix 40 mg daily for 3 days then he can stop and use for weight gain/LE edema/dyspnea.   MCALHANY,CHRISTOPHER 12/30/2014 1:33 PM

## 2014-12-30 NOTE — Discharge Summary (Signed)
CARDIOLOGY DISCHARGE SUMMARY   Patient ID: David [name] MRN: [id number] DOB/AGE: 1940-08-22 74 y.o.  Admit date: 12/29/2014 Discharge date: 01/03/2015  PCP: Mathews Argyle, MD Primary Cardiologist: Dr Irish Lack, Dr Lovena Le  Primary Discharge Diagnosis: Acute heart failure  Secondary Discharge Diagnosis:    Pacemaker-St Jude   Coronary atherosclerosis of native coronary artery   Aneurysm of thoracic aorta   Anticoagulation adequate with anticoagulant therapy   Paroxysmal atrial fibrillation   Congestive heart disease   Coronary artery disease involving native coronary artery of native heart with unstable angina pectoris  Procedures: 2V CXR  Hospital Course: David [name] is a 74 y.o. male with a history of CAD, CHB w/ PPM, small asc Ao aneurysm, HTN, HL, DM, and peripheral neuropathy.  He developed edema, DOE, and mild chest discomfort. He came to the hospital and was volume overloaded. He was admitted for further evaluation and treatment.  He was given IV Lasix with improvement in his respiratory status. He admitted to recent dietary discretions and stated he would work to improve this. His BUN/Cr increased slightly from baseline with diuresis, but he will not need continuous Lasix, just Lasix 40 mg qd for 3 days, then PRN use for weight gain.   His ECG on admission showed atrial fibrillation with ventricular pacing. His atrial fib burden had been higher recently, at 10-17% (up from 1%). His initial troponin was mildly elevated, but subsequent troponins did not show an ischemic pattern and are felt secondary to the CHF/atrial fibrillation. With improvement in his respiratory status, his chest pain resolved.   His Elilquis was held on admission due to concerns about needing an ischemic evaluation, but he will restart the Eliquis since no invasive procedures are planned.   His EF was previously normal, and an echocardiogram was performed to make sure that his EF is unchanged.  Results are below. His EF was normal, no WMA.    Labs:   Lab Results  Component Value Date   WBC 6.7 12/30/2014   HGB 13.2 12/30/2014   HCT 38.1* 12/30/2014   MCV 96.2 12/30/2014   PLT 124* 12/30/2014     Recent Labs Lab 12/30/14 0316  NA 138  K 3.6  CL 103  CO2 27  BUN 17  CREATININE 1.28*  CALCIUM 8.9  GLUCOSE 124*   B NATRIURETIC PEPTIDE  Date/Time Value Ref Range Status  12/29/2014 09:45 AM 660.8* 0.0 - 100.0 pg/mL Final      Radiology: Dg Chest 2 View 12/29/2014   ADDENDUM REPORT: 12/29/2014 11:03  ADDENDUM: This exam was subsequently reviewed and discussed with Dr. Venora Maples. Bibasilar interstitial infiltrates are seen with associated Kerley B-lines, which favors bibasilar interstitial edema over pneumonia. No evidence of pulmonary consolidation. Small bilateral pleural effusions are present. These findings favor mild congestive heart failure over bibasilar pneumonia.   Electronically Signed   By: Earle Gell M.D.   On: 12/29/2014 11:03   12/29/2014   CLINICAL DATA:  Shortness of breath, lying down at night. Shortness of breath is getting worse in general. Blood pressure increases at night. Chest tightness, cough for 1 year. Untreated aspiration.  EXAM: CHEST  2 VIEW  COMPARISON:  04/18/2011  FINDINGS: Left-sided transvenous pacemaker leads to the right atrium and right ventricle. Heart is mildly enlarged. Patchy density identified within the right middle lobe and lateral aspects of both lower lobes, consistent with infectious infiltrate. Small bilateral pleural effusions. No pulmonary edema. Mild mid thoracic spondylosis.  IMPRESSION: 1. Cardiomegaly.  No  edema. 2. Bilateral pleural effusions. 3. Right middle lobe and bilateral lower lobe infiltrates.  Electronically Signed: By: Nolon Nations M.D. On: 12/29/2014 09:32    EKG: 12/29/2014 Atrial fib, V pacing  Echo:  Study Conclusions - Left ventricle: The cavity size was normal. There was mild concentric hypertrophy.  Systolic function was normal. The estimated ejection fraction was in the range of 60% to 65%. Wall motion was normal; there were no regional wall motion abnormalities. Features are consistent with a pseudonormal left ventricular filling pattern, with concomitant abnormal relaxation and increased filling pressure (grade 2 diastolic dysfunction). Doppler parameters are consistent with elevated ventricular end-diastolic filling pressure. - Aortic valve: Functionally bicuspid; mildly thickened, mildly calcified leaflets. Valve mobility was restricted. There was very mild stenosis. There was no regurgitation. - Aortic root: The aortic root was normal in size. - Mitral valve: There was mild regurgitation. - Left atrium: The atrium was moderately dilated. - Right ventricle: Pacer wire or catheter noted in right ventricle. Systolic function was normal. - Right atrium: Pacer wire or catheter noted in right atrium. - Tricuspid valve: There was mild regurgitation. - Pulmonary arteries: Systolic pressure was within the normal range. PA peak pressure: 32 mm Hg (S). - Inferior vena cava: The vessel was dilated. The respirophasic diameter changes were blunted (< 50%), consistent with elevated central venous pressure. - Pericardium, extracardiac: There was no pericardial effusion. Impressions: - When compared to the prior study from 06/14/2014 there is no significant difference. There is functionaly bicuspid valve with mild aortic stenosis. Ascending aorta was not well visualized on the current study.   FOLLOW UP PLANS AND APPOINTMENTS Allergies  Allergen Reactions  . Benadryl [Diphenhydramine Hcl] Other (See Comments)    Adverse reaction, has very strange behavior. Also hyperactive.  [name] Antihistamines, Chlorpheniramine-Type Palpitations  . Restoril [Temazepam] Other (See Comments) and Palpitations    Hyperactive     Medication List    STOP taking these  medications        CORICIDIN D PO     loratadine-pseudoephedrine 10-240 MG per 24 hr tablet  Commonly known as:  CLARITIN-D 24-hour      TAKE these medications        acetaminophen 500 MG tablet  Commonly known as:  TYLENOL  Take 500 mg by mouth every 6 (six) hours as needed for mild pain.     apixaban 5 MG Tabs tablet  Commonly known as:  ELIQUIS  Take 1 tablet (5 mg total) by mouth 2 (two) times daily.     cholecalciferol 1000 UNITS tablet  Commonly known as:  VITAMIN D  Take 1,000 Units by mouth daily.     CoQ10 100 MG Caps  Take 100 mg by mouth daily.     famotidine 10 MG tablet  Commonly known as:  PEPCID  Take 10 mg by mouth 2 (two) times daily.     FISH OIL PO  1 capsule with a meal     fluticasone 50 MCG/ACT nasal spray  Commonly known as:  FLONASE  Place 1 spray into both nostrils daily.     furosemide 40 MG tablet  Commonly known as:  LASIX  Take 1 tablet (40 mg total) by mouth daily. Take daily for 3 days, then daily PRN for weight gain or edema.  Notes to Patient:  Take for weight gain of 5 lbs in a week, 3 lbs in a day or morning edema.     metoprolol 50 MG tablet  Commonly known  as:  LOPRESSOR  Take 25 mg by mouth 2 (two) times daily.     multivitamin capsule  TAKE 1 CAPSULE THREE TIMES A WEEK     naproxen 500 MG tablet  Commonly known as:  NAPROSYN  Take 500 mg by mouth 2 (two) times daily as needed for mild pain.     niacin 1000 MG CR tablet  Commonly known as:  NIASPAN  Take 2,000 mg by mouth daily.     nitroGLYCERIN 0.4 MG SL tablet  Commonly known as:  NITROSTAT  Place 1 tablet (0.4 mg total) under the tongue every 5 (five) minutes as needed. For chest pain     omeprazole 20 MG capsule  Commonly known as:  PRILOSEC  Take 20 mg by mouth daily as needed. For acid reflux     potassium chloride 10 MEQ tablet  Commonly known as:  K-DUR  Take 1 tablet (10 mEq total) by mouth daily. Take potassium every time you take furosemide (Lasix)       pramipexole 1 MG tablet  Commonly known as:  MIRAPEX  Take 2 mg by mouth at bedtime.     simvastatin 10 MG tablet  Commonly known as:  ZOCOR  Take 10 mg by mouth daily.     Vitamin B-12 2500 MCG Subl  Place 1 tablet under the tongue daily.     vitamin C 500 MG tablet  Commonly known as:  ASCORBIC ACID  Take 500 mg by mouth daily.        Discharge Instructions    Diet - low sodium heart healthy    Complete by:  As directed      Discharge instructions    Complete by:  As directed   Monitor your weight every morning.  If you gain 3 pounds in 24 hours, or 5 pounds in a week, call the office for instructions.     Increase activity slowly    Complete by:  As directed           Follow-up Information    Follow up with Jettie Booze., MD.   Specialties:  Cardiology, Radiology, Interventional Cardiology   Why:  The office will call. Confirmed with the office that they will call the patient with an appointment.   Contact information:   [address] [address] [phone]       Follow up with [name], MD On 01/16/2015.   Specialty:  Cardiology   Why:  As scheduled @ 4:30 PM. Confirmed with the office.   Contact information:   [address] [address] [phone]       Follow up with Mathews Argyle, MD On 01/09/2015.   Specialty:  Internal Medicine   Why:  @ 3:30 PM. Confirmed appointment with Tammy   Contact information:   [address] [place] [address] [phone]       BRING ALL MEDICATIONS WITH YOU TO FOLLOW UP APPOINTMENTS  Time spent with patient to include physician time: 42 min Signed: Rosaria Ferries, PA-C 01/03/2015, 12:36 PM Co-Sign MD

## 2015-01-02 ENCOUNTER — Telehealth: Payer: Self-pay | Admitting: Cardiovascular Disease

## 2015-01-02 NOTE — Telephone Encounter (Signed)
D/C phone call .[name] Appt is on 08/022/16 w/ Dr. Lovena Le at [facility] .[name] Thanks

## 2015-01-09 DIAGNOSIS — I480 Paroxysmal atrial fibrillation: Secondary | ICD-10-CM | POA: Diagnosis not present

## 2015-01-09 DIAGNOSIS — M4806 Spinal stenosis, lumbar region: Secondary | ICD-10-CM | POA: Diagnosis not present

## 2015-01-09 DIAGNOSIS — I10 Essential (primary) hypertension: Secondary | ICD-10-CM | POA: Diagnosis not present

## 2015-01-09 DIAGNOSIS — I509 Heart failure, unspecified: Secondary | ICD-10-CM | POA: Diagnosis not present

## 2015-01-09 DIAGNOSIS — Z79899 Other long term (current) drug therapy: Secondary | ICD-10-CM | POA: Diagnosis not present

## 2015-01-09 DIAGNOSIS — E119 Type 2 diabetes mellitus without complications: Secondary | ICD-10-CM | POA: Diagnosis not present

## 2015-01-09 NOTE — Telephone Encounter (Signed)
TCM call to patient.He stated he is doing well.Taking medications as prescribed.Advised to keep appointment for pacemaker check 01/16/15 4:30 pm at [facility] office.Post hospital appointment with Dr.Varanasi 02/02/15 1:15 pm [facility] office.

## 2015-01-16 ENCOUNTER — Ambulatory Visit (INDEPENDENT_AMBULATORY_CARE_PROVIDER_SITE_OTHER): Payer: Medicare Other | Admitting: *Deleted

## 2015-01-16 ENCOUNTER — Encounter: Payer: Self-pay | Admitting: Internal Medicine

## 2015-01-16 DIAGNOSIS — I442 Atrioventricular block, complete: Secondary | ICD-10-CM | POA: Diagnosis not present

## 2015-01-16 DIAGNOSIS — I480 Paroxysmal atrial fibrillation: Secondary | ICD-10-CM

## 2015-01-16 DIAGNOSIS — Z950 Presence of cardiac pacemaker: Secondary | ICD-10-CM | POA: Diagnosis not present

## 2015-01-16 LAB — CUP PACEART INCLINIC DEVICE CHECK
Battery Remaining Longevity: 78 mo
Battery Voltage: 2.92 V
Date Time Interrogation Session: 20160822171007
Lead Channel Impedance Value: 437.5 Ohm
Lead Channel Pacing Threshold Amplitude: 1 V
Lead Channel Pacing Threshold Pulse Width: 0.5 ms
Lead Channel Pacing Threshold Pulse Width: 0.5 ms
Lead Channel Sensing Intrinsic Amplitude: 2.8 mV
Lead Channel Setting Pacing Amplitude: 1.25 V
Lead Channel Setting Pacing Pulse Width: 0.5 ms
Lead Channel Setting Sensing Sensitivity: 4 mV
MDC IDC MSMT LEADCHNL RA PACING THRESHOLD AMPLITUDE: 0.5 V
MDC IDC MSMT LEADCHNL RV IMPEDANCE VALUE: 487.5 Ohm
MDC IDC MSMT LEADCHNL RV SENSING INTR AMPL: 12 mV
MDC IDC PG SERIAL: 7280216
MDC IDC SET LEADCHNL RA PACING AMPLITUDE: 1.5 V
MDC IDC STAT BRADY RA PERCENT PACED: 92 %
MDC IDC STAT BRADY RV PERCENT PACED: 99.89 %

## 2015-01-16 NOTE — Progress Notes (Signed)
Pacemaker check in clinic. Normal device function. Thresholds, sensing, impedances consistent with previous measurements. Device programmed to maximize longevity. 1.2% mode switch + eliquis. No high ventricular rates noted. 507 PMT. Device programmed at appropriate safety margins. Histogram distribution appropriate for patient activity level. Device programmed to optimize intrinsic conduction. Estimated longevity 6.5-7.39yrs. Merlin 04/17/15 & ROV w/ GT 2/17.

## 2015-01-19 DIAGNOSIS — M5442 Lumbago with sciatica, left side: Secondary | ICD-10-CM | POA: Diagnosis not present

## 2015-01-19 DIAGNOSIS — M4696 Unspecified inflammatory spondylopathy, lumbar region: Secondary | ICD-10-CM | POA: Diagnosis not present

## 2015-01-27 DIAGNOSIS — M5442 Lumbago with sciatica, left side: Secondary | ICD-10-CM | POA: Diagnosis not present

## 2015-01-27 DIAGNOSIS — G8929 Other chronic pain: Secondary | ICD-10-CM | POA: Diagnosis not present

## 2015-02-02 ENCOUNTER — Encounter: Payer: Self-pay | Admitting: Interventional Cardiology

## 2015-02-02 ENCOUNTER — Ambulatory Visit (INDEPENDENT_AMBULATORY_CARE_PROVIDER_SITE_OTHER): Payer: Medicare Other | Admitting: Interventional Cardiology

## 2015-02-02 VITALS — BP 112/58 | HR 67 | Ht 72.0 in | Wt 172.0 lb

## 2015-02-02 DIAGNOSIS — I25110 Atherosclerotic heart disease of native coronary artery with unstable angina pectoris: Secondary | ICD-10-CM | POA: Diagnosis not present

## 2015-02-02 DIAGNOSIS — E785 Hyperlipidemia, unspecified: Secondary | ICD-10-CM | POA: Diagnosis not present

## 2015-02-02 DIAGNOSIS — I442 Atrioventricular block, complete: Secondary | ICD-10-CM | POA: Diagnosis not present

## 2015-02-02 DIAGNOSIS — I359 Nonrheumatic aortic valve disorder, unspecified: Secondary | ICD-10-CM

## 2015-02-02 DIAGNOSIS — I2510 Atherosclerotic heart disease of native coronary artery without angina pectoris: Secondary | ICD-10-CM

## 2015-02-02 DIAGNOSIS — Z950 Presence of cardiac pacemaker: Secondary | ICD-10-CM

## 2015-02-02 DIAGNOSIS — I480 Paroxysmal atrial fibrillation: Secondary | ICD-10-CM | POA: Diagnosis not present

## 2015-02-02 MED ORDER — NIACIN ER (ANTIHYPERLIPIDEMIC) 1000 MG PO TBCR
1000.0000 mg | EXTENDED_RELEASE_TABLET | Freq: Every day | ORAL | Status: DC
Start: 2015-02-02 — End: 2017-10-07

## 2015-02-02 NOTE — Patient Instructions (Addendum)
Medication Instructions:   START TAKING 1000MG  NIACIN ONCE A DAY    Labwork:  IN 3 MONTHS  FASTING LIVER AND LIPIDS   Testing/Procedures:  NONE ORDER TODAY    Follow-Up:  Your physician wants you to follow-up in: [place] will receive a reminder letter in the mail two months in advance. If you don't receive a letter, please call our office to schedule the follow-up appointment.    Any Other Special Instructions Will Be Listed Below (If Applicable).

## 2015-02-02 NOTE — Progress Notes (Signed)
Patient ID: David [name], male   DOB: [birth year], 74 y.o.   MRN: [id number]     Cardiology Office Note   Date:  02/02/2015   ID:  David [name], DOB 1941-04-14, MRN [id number]  PCP:  Mathews Argyle, MD    No chief complaint on file. f/u diastolic heart failure, AFib   Wt Readings from Last 3 Encounters:  02/02/15 172 lb (78.019 kg)  12/30/14 174 lb (78.926 kg)  08/15/14 179 lb 12.8 oz (81.557 kg)       History of Present Illness: David [name] is a 74 y.o. male  who had CHB followed by a pacer placement in 2012. No further chest pain. He thinks he is getting a little more fatigued.  He was diagnosed with atrial fibrillation on pacer check. He was started on Eliquis.   H/o LAD atherectomy in 1999.  No discomfort in his chest with walking or riding the bike. He is limited by back pain. He has had a mild thoracic aneurysm. No change in studies 4 years apart.  He has taken steroids and pain meds for these pains.  He has resumed this since leaving the hospital.   He has LE edema is gone after the Lasix. He has lost 13 lbs in the hospital.  He has not needed any Lasix since leaving the hospital. He uses the stationary bike since 2x/week. His fasting blood sugar was mildly elevated and he is trying to modify this with lifestyle changes.  A1C was 6.3 at last check.   he was hospitalized in August 2016 4 acute on chronic diastolic heart failure. He has chronic atrial fibrillation in this likely contributed to his fluid retention. He also had excessive salt in his diet. He is working on this now. Echocardiogram was repeated showing normal left ventricular function without wall motion abnormalities.  He has not had swelling since leaving the hospital.     Past Medical History  Diagnosis Date  . Coronary artery disease   . Hypertension   . Pacemaker-St Judes 07/16/2011  . Mixed hyperlipidemia   . Pure hypercholesterolemia   . Coronary atherosclerosis of unspecified type of  vessel, native or graft   . Type II or unspecified type diabetes mellitus without mention of complication, not stated as uncontrolled   . Peripheral neuropathy   . Other postoperative functional disorders   . Insomnia, unspecified   . Unspecified sleep apnea   . Thoracic aneurysm without mention of rupture     Measured at 45 mm in the past. Consider repeat MRA in 2015.  [name] Hyperglycemia   . Restless leg syndrome   . Ascending aorta dilatation     Fusiform dilatation ascending aorta 44 x 45 mm - 06/2007  . Heart murmur 04/25/09    Mild aortic calcification on echo 07/03/2009  . Spermatocele     near left testicle 2 cm  . Gilbert's syndrome   . Spinal stenosis, lumbar   . Thrombocytopenia     Averaging 140,000 since 2008  . Tubular adenoma 09/2012    Dr. Watt Climes repeat 4 years  . Complete heart block     Treated with pacemaker in 02/2011 - Dr. [name]    Past Surgical History  Procedure Laterality Date  . Coronary stent placement    . Pacermaker  07/16/11    Dr. [name]  . Hernia repair Bilateral   . Cholecystectomy  1988  . Appendectomy  1957  . Hemorroidectomy  1980  . Tee without cardioversion N/A 06/14/2014  Procedure: TRANSESOPHAGEAL ECHOCARDIOGRAM (TEE);  Surgeon: [name], MD;  Location: [facility] ENDOSCOPY;  Service: Cardiovascular;  Laterality: N/A;     Current Outpatient Prescriptions  Medication Sig Dispense Refill  . acetaminophen (TYLENOL) 500 MG tablet Take 500 mg by mouth every 6 (six) hours as needed for mild pain.    [name] apixaban (ELIQUIS) 5 MG TABS tablet Take 1 tablet (5 mg total) by mouth 2 (two) times daily. 60 tablet 1  . cholecalciferol (VITAMIN D) 1000 UNITS tablet Take 1,000 Units by mouth daily.    . Coenzyme Q10 (COQ10) 100 MG CAPS Take 100 mg by mouth daily.     . Cyanocobalamin (VITAMIN B-12) 2500 MCG SUBL Place 1 tablet under the tongue daily.    . famotidine (PEPCID) 10 MG tablet Take 10 mg by mouth 2 (two) times daily.     . fluticasone (FLONASE) 50  MCG/ACT nasal spray Place 1 spray into both nostrils as needed.     . furosemide (LASIX) 40 MG tablet Take 1 tablet (40 mg total) by mouth daily. Take daily for 3 days, then daily PRN for weight gain or edema. 30 tablet 11  . metoprolol (LOPRESSOR) 50 MG tablet Take 25 mg by mouth 2 (two) times daily.     . Multiple Vitamin (MULTIVITAMIN) capsule TAKE 1 CAPSULE THREE TIMES A WEEK    . naproxen (NAPROSYN) 500 MG tablet Take 500 mg by mouth 2 (two) times daily as needed for mild pain.    . niacin (NIASPAN) 1000 MG CR tablet Take 2,000 mg by mouth daily.    . nitroGLYCERIN (NITROSTAT) 0.4 MG SL tablet Place 1 tablet (0.4 mg total) under the tongue every 5 (five) minutes as needed. For chest pain 25 tablet 5  . Omega-3 Fatty Acids (FISH OIL PO) 1,000 Units. 1 capsule with a meal    . omeprazole (PRILOSEC) 20 MG capsule Take 20 mg by mouth daily as needed. For acid reflux     . potassium chloride (K-DUR) 10 MEQ tablet Take 1 tablet (10 mEq total) by mouth daily. Take potassium every time you take furosemide (Lasix) (Patient taking differently: Take 10 mEq by mouth daily. every time you take furosemide (Lasix)) 30 tablet 11  . pramipexole (MIRAPEX) 1 MG tablet Take 2 mg by mouth at bedtime.      . simvastatin (ZOCOR) 10 MG tablet Take 10 mg by mouth daily.      . vitamin C (ASCORBIC ACID) 500 MG tablet Take 500 mg by mouth daily.     No current facility-administered medications for this visit.    Allergies:   Benadryl; Antihistamines, chlorpheniramine-type; and Restoril    Social History:  The patient  reports that he has quit smoking. He does not have any smokeless tobacco history on file. He reports that he drinks alcohol. He reports that he does not use illicit drugs.   Family History:  The patient's family history includes CAD in his father; Cancer in his mother; Diabetes in his father; Heart disease in his father; Hypertension in his father and sister.    ROS:  Please see the history of  present illness.   Otherwise, review of systems are positive for recent fluid overload.  Mild DOE when walking longer distances, back pain All other systems are reviewed and negative.    PHYSICAL EXAM: VS:  BP 112/58 mmHg  Pulse 67  Ht 6' (1.829 m)  Wt 172 lb (78.019 kg)  BMI 23.32 kg/m2 , BMI Body mass index  is 23.32 kg/(m^2). GEN: Well nourished, well developed, in no acute distress HEENT: normal Neck: no JVD, carotid bruits, or masses Cardiac: RRR; 2/6 systolic murmurs,no rubs, or gallops,no edema  Respiratory:  clear to auscultation bilaterally, normal work of breathing GI: soft, nontender, nondistended, + BS MS: no deformity or atrophy Skin: warm and dry, no rash Neuro:  Strength and sensation are intact Psych: euthymic mood, full affect    Recent Labs: 12/29/2014: B Natriuretic Peptide 660.8* 12/30/2014: BUN 17; Creatinine, Ser 1.28*; Hemoglobin 13.2; Platelets 124*; Potassium 3.6; Sodium 138   Lipid Panel No results found for: CHOL, TRIG, HDL, CHOLHDL, VLDL, LDLCALC, LDLDIRECT   Other studies Reviewed: Additional studies/ records that were reviewed today with results demonstrating:  Echo report reviewed..   ASSESSMENT AND PLAN:  1. Chronic diastolic heart failure: Exaceration in 8/16. Diuresed. Now stable.  Only using the LAsix as needed. Has yet to use it since leaving the hospital.   Continue daily weights. He'll let us know if there is any significant change. Continue to minimize sodium in the diet. 2. CAD: Recent anginal sx were likely related to fluid overload and resolved with medical therapy.  S/p atherectomy many years ago.  No significant angina.   3. Pacer: Placed for CHB.  THis is where PAF was diagnosed.  COntinue Eliquis for stroke prevention.   Appears to be in normal sinus rhythm now. 4.  hyperlipidemia: He is seen information the news were niacin is not as beneficial as we previously thought. He like to decrease his niacin to 1000 mg. We will recheck his  lipids in about 3 months along with liver function tests. 5. Aortic valve disorders: Functionally bicuspid aortic valve. Minimal stenosis by 2016 echocardiogram.   Current medicines are reviewed at length with the patient today.  The patient concerns regarding his medicines were addressed.  The following changes have been made:  Decrease niacin to 1000 mg daily  Labs/ tests ordered today include:  No orders of the defined types were placed in this encounter.    Recommend 150 minutes/week of aerobic exercise Low fat, low carb, high fiber diet recommended  Disposition:   FU in 1 year   Teresita Madura., MD  02/02/2015 1:52 PM    [facility] [place], [place], [place]  [address] Phone: [phone]; Fax: [phone]

## 2015-02-17 DIAGNOSIS — M5442 Lumbago with sciatica, left side: Secondary | ICD-10-CM | POA: Diagnosis not present

## 2015-02-17 DIAGNOSIS — G8929 Other chronic pain: Secondary | ICD-10-CM | POA: Diagnosis not present

## 2015-04-04 DIAGNOSIS — L570 Actinic keratosis: Secondary | ICD-10-CM | POA: Diagnosis not present

## 2015-04-04 DIAGNOSIS — L281 Prurigo nodularis: Secondary | ICD-10-CM | POA: Diagnosis not present

## 2015-04-04 DIAGNOSIS — D239 Other benign neoplasm of skin, unspecified: Secondary | ICD-10-CM | POA: Diagnosis not present

## 2015-04-17 ENCOUNTER — Ambulatory Visit (INDEPENDENT_AMBULATORY_CARE_PROVIDER_SITE_OTHER): Payer: Medicare Other | Admitting: *Deleted

## 2015-04-17 DIAGNOSIS — I442 Atrioventricular block, complete: Secondary | ICD-10-CM | POA: Diagnosis not present

## 2015-04-17 NOTE — Progress Notes (Signed)
Remote pacemaker transmission.   

## 2015-04-25 LAB — CUP PACEART REMOTE DEVICE CHECK
Battery Remaining Longevity: 82 mo
Battery Voltage: 2.92 V
Brady Statistic AP VS Percent: 1 %
Brady Statistic AS VP Percent: 8.7 %
Brady Statistic AS VS Percent: 1 %
Date Time Interrogation Session: 20161121111406
Implantable Lead Implant Date: 20121031
Implantable Lead Implant Date: 20121031
Implantable Lead Location: 753859
Implantable Lead Location: 753860
Lead Channel Impedance Value: 400 Ohm
Lead Channel Impedance Value: 440 Ohm
Lead Channel Pacing Threshold Amplitude: 0.625 V
Lead Channel Pacing Threshold Amplitude: 1.125 V
Lead Channel Pacing Threshold Pulse Width: 0.5 ms
Lead Channel Pacing Threshold Pulse Width: 0.5 ms
Lead Channel Sensing Intrinsic Amplitude: 12 mV
Lead Channel Setting Pacing Amplitude: 1.375
Lead Channel Setting Pacing Amplitude: 1.625
Lead Channel Setting Pacing Pulse Width: 0.5 ms
MDC IDC MSMT BATTERY REMAINING PERCENTAGE: 73 %
MDC IDC MSMT LEADCHNL RA SENSING INTR AMPL: 2.1 mV
MDC IDC SET LEADCHNL RV SENSING SENSITIVITY: 4 mV
MDC IDC STAT BRADY AP VP PERCENT: 91 %
MDC IDC STAT BRADY RA PERCENT PACED: 90 %
MDC IDC STAT BRADY RV PERCENT PACED: 99 %
Pulse Gen Model: 2210
Pulse Gen Serial Number: 7280216

## 2015-04-26 ENCOUNTER — Encounter: Payer: Self-pay | Admitting: Cardiology

## 2015-05-02 ENCOUNTER — Telehealth: Payer: Self-pay | Admitting: Interventional Cardiology

## 2015-05-02 NOTE — Telephone Encounter (Signed)
New Message  Pt c/o swelling: STAT is pt has developed SOB within 24 hours  1. How long have you been experiencing swelling? 4 days   2. Where is the swelling located? Legs, feet and ankles  3.  Are you currently taking a "fluid pill"? Yes   4.  Are you currently SOB? No   5.  Have you traveled recently?yes   Comments: Per pt's wife, Pt was at 8 lbs as of Dec 4th. They took the lasix on the 4th the 5th and the 6th. The weight has gone back down. They believe that the reason why this happened is because they went out of town for 4 days without exercise or well eating. His feet, legs and ankles were swollen, but the sweeling has gone down now. Please call back to discuss.

## 2015-05-02 NOTE — Telephone Encounter (Signed)
**Note De-Identified Kiyaan Haq Obfuscation** The pts wife, Santiago Glad, states that she just wants Dr Irish Lack to be aware of this message and is requesting a call back if he has any further recommendations for the pt. Please advise.

## 2015-05-04 ENCOUNTER — Other Ambulatory Visit (INDEPENDENT_AMBULATORY_CARE_PROVIDER_SITE_OTHER): Payer: Medicare Other

## 2015-05-04 DIAGNOSIS — E785 Hyperlipidemia, unspecified: Secondary | ICD-10-CM | POA: Diagnosis not present

## 2015-05-04 LAB — LIPID PANEL
CHOLESTEROL: 119 mg/dL — AB (ref 125–200)
HDL: 58 mg/dL (ref >=40)
LDL Cholesterol: 49 mg/dL (ref <130)
Total CHOL/HDL Ratio: 2.1 Ratio (ref <=5.0)
Triglycerides: 62 mg/dL (ref <150)
VLDL: 12 mg/dL (ref <30)

## 2015-05-04 LAB — HEPATIC FUNCTION PANEL
ALT: 35 U/L (ref 9–46)
AST: 29 U/L (ref 10–35)
Albumin: 3.6 g/dL (ref 3.6–5.1)
Alkaline Phosphatase: 84 U/L (ref 40–115)
BILIRUBIN INDIRECT: 1.8 mg/dL — AB (ref 0.2–1.2)
BILIRUBIN TOTAL: 2.3 mg/dL — AB (ref 0.2–1.2)
Bilirubin, Direct: 0.5 mg/dL — ABNORMAL HIGH (ref <=0.2)
Total Protein: 6.7 g/dL (ref 6.1–8.1)

## 2015-05-04 NOTE — Telephone Encounter (Signed)
Spoke with Santiago Glad, pt's wife and informed her of recommendations per Dr. Irish Lack. Wife verbalized understanding and was in agreement with this plan.

## 2015-05-04 NOTE — Telephone Encounter (Signed)
Elevate legs also. COntinue to use the diuretic as he needs for the swelling.

## 2015-05-26 DIAGNOSIS — Z0000 Encounter for general adult medical examination without abnormal findings: Secondary | ICD-10-CM | POA: Diagnosis not present

## 2015-05-26 DIAGNOSIS — R609 Edema, unspecified: Secondary | ICD-10-CM | POA: Diagnosis not present

## 2015-05-26 DIAGNOSIS — Z1389 Encounter for screening for other disorder: Secondary | ICD-10-CM | POA: Diagnosis not present

## 2015-05-26 DIAGNOSIS — I480 Paroxysmal atrial fibrillation: Secondary | ICD-10-CM | POA: Diagnosis not present

## 2015-05-26 DIAGNOSIS — E7800 Pure hypercholesterolemia, unspecified: Secondary | ICD-10-CM | POA: Diagnosis not present

## 2015-05-26 DIAGNOSIS — D696 Thrombocytopenia, unspecified: Secondary | ICD-10-CM | POA: Diagnosis not present

## 2015-05-26 DIAGNOSIS — Z79899 Other long term (current) drug therapy: Secondary | ICD-10-CM | POA: Diagnosis not present

## 2015-05-26 DIAGNOSIS — I10 Essential (primary) hypertension: Secondary | ICD-10-CM | POA: Diagnosis not present

## 2015-05-26 DIAGNOSIS — R7309 Other abnormal glucose: Secondary | ICD-10-CM | POA: Diagnosis not present

## 2015-05-30 ENCOUNTER — Telehealth: Payer: Self-pay | Admitting: Interventional Cardiology

## 2015-05-30 ENCOUNTER — Other Ambulatory Visit: Payer: Self-pay

## 2015-05-30 MED ORDER — FUROSEMIDE 40 MG PO TABS: ORAL_TABLET | ORAL | Status: DC

## 2015-05-30 NOTE — Telephone Encounter (Signed)
New problem    Pt need to speak to you concerning his weight loss and lasix. Please call pt.

## 2015-05-30 NOTE — Telephone Encounter (Signed)
The pt has questions concerning the directions on his Lasix instructions. He is advised to only take Lasix for weight gain or edema. He verbalized understanding.

## 2015-07-13 ENCOUNTER — Ambulatory Visit (INDEPENDENT_AMBULATORY_CARE_PROVIDER_SITE_OTHER): Payer: Medicare HMO | Admitting: Internal Medicine

## 2015-07-13 ENCOUNTER — Encounter: Payer: Self-pay | Admitting: Internal Medicine

## 2015-07-13 VITALS — BP 144/64 | HR 76 | Ht 72.0 in | Wt 179.2 lb

## 2015-07-13 DIAGNOSIS — I442 Atrioventricular block, complete: Secondary | ICD-10-CM

## 2015-07-13 LAB — CUP PACEART INCLINIC DEVICE CHECK
Battery Remaining Longevity: 76.8
Date Time Interrogation Session: 20170216131038
Implantable Lead Implant Date: 20121031
Implantable Lead Location: 753859
Lead Channel Impedance Value: 437.5 Ohm
Lead Channel Pacing Threshold Amplitude: 1.125 V
Lead Channel Pacing Threshold Pulse Width: 0.5 ms
Lead Channel Pacing Threshold Pulse Width: 0.5 ms
Lead Channel Sensing Intrinsic Amplitude: 12 mV
Lead Channel Setting Pacing Amplitude: 1.375
Lead Channel Setting Pacing Amplitude: 1.5 V
Lead Channel Setting Sensing Sensitivity: 4 mV
MDC IDC LEAD IMPLANT DT: 20121031
MDC IDC LEAD LOCATION: 753860
MDC IDC MSMT BATTERY VOLTAGE: 2.92 V
MDC IDC MSMT LEADCHNL RA IMPEDANCE VALUE: 400 Ohm
MDC IDC MSMT LEADCHNL RA PACING THRESHOLD AMPLITUDE: 0.5 V
MDC IDC MSMT LEADCHNL RA SENSING INTR AMPL: 3.3 mV
MDC IDC SET LEADCHNL RV PACING PULSEWIDTH: 0.5 ms
MDC IDC STAT BRADY RA PERCENT PACED: 90 %
MDC IDC STAT BRADY RV PERCENT PACED: 99.97 %
Pulse Gen Serial Number: 7280216

## 2015-07-13 NOTE — Patient Instructions (Addendum)
Medication Instructions:  Your physician recommends that you continue on your current medications as directed. Please refer to the Current Medication list given to you today.   Labwork: None ordered   Testing/Procedures: None ordered   Follow-Up: Remote monitoring is used to monitor your Pacemaker  from home. This monitoring reduces the number of office visits required to check your device to one time per year. It allows [place] to keep an eye on the functioning of your device to ensure it is working properly. You are scheduled for a device check from home on 10/12/15. You may send your transmission at any time that day. If you have a wireless device, the transmission will be sent automatically. After your physician reviews your transmission, you will receive a postcard with your next transmission date.  Your physician wants you to follow-up in: 6 months with device clinic and 12 months with Dr Zonia Kief will receive a reminder letter in the mail two months in advance. If you don't receive a letter, please call our office to schedule the follow-up appointment.     Any Other Special Instructions Will Be Listed Below (If Applicable).     If you need a refill on your cardiac medications before your next appointment, please call your pharmacy.

## 2015-07-13 NOTE — Progress Notes (Signed)
HPI Mr. Lanyon returns today for evaluation and management of his DDD. He has a h/o complete heart block and underwent PPM insertion over 4 years ago. He has done well. No syncope. He denies any CHF symptoms. No peripheral edema. He drives a motorcycle. He remains acitve, exercising on a regular basis. No palpitations. He has had some problems with peripheral edema. He denies palpitations.  Allergies  Allergen Reactions  . Benadryl [Diphenhydramine Hcl] Other (See Comments)    Adverse reaction, has very strange behavior. Also hyperactive.  [name] Antihistamines, Chlorpheniramine-Type Palpitations  . Restoril [Temazepam] Other (See Comments) and Palpitations    Hyperactive     Current Outpatient Prescriptions  Medication Sig Dispense Refill  . acetaminophen (TYLENOL) 500 MG tablet Take 500 mg by mouth every 6 (six) hours as needed for mild pain.    [name] apixaban (ELIQUIS) 5 MG TABS tablet Take 1 tablet (5 mg total) by mouth 2 (two) times daily. 60 tablet 1  . cholecalciferol (VITAMIN D) 1000 UNITS tablet Take 1,000 Units by mouth daily.    . Coenzyme Q10 (COQ10) 100 MG CAPS Take 100 mg by mouth daily.     . Cyanocobalamin (VITAMIN B-12) 2500 MCG SUBL Place 1 tablet under the tongue daily.    . famotidine (PEPCID) 10 MG tablet Take 10 mg by mouth 2 (two) times daily.     . fluticasone (FLONASE) 50 MCG/ACT nasal spray Place 1 spray into both nostrils as needed.     . furosemide (LASIX) 40 MG tablet Take 1 tablet as needed for for weight gain/edema 30 tablet 11  . metoprolol (LOPRESSOR) 50 MG tablet Take 25 mg by mouth 2 (two) times daily.     . Multiple Vitamin (MULTIVITAMIN) capsule TAKE 1 CAPSULE THREE TIMES A WEEK    . naproxen (NAPROSYN) 500 MG tablet Take 500 mg by mouth 2 (two) times daily as needed for mild pain.    . niacin (NIASPAN) 1000 MG CR tablet Take 1 tablet (1,000 mg total) by mouth daily. 30 tablet 6  . nitroGLYCERIN (NITROSTAT) 0.4 MG SL tablet Place 1 tablet (0.4 mg total)  under the tongue every 5 (five) minutes as needed. For chest pain 25 tablet 5  . Omega-3 Fatty Acids (FISH OIL PO) 1,000 Units. 1 capsule with a meal    . potassium chloride (K-DUR) 10 MEQ tablet Take 1 tablet (10 mEq total) by mouth daily. Take potassium every time you take furosemide (Lasix) (Patient taking differently: Take 10 mEq by mouth daily. every time you take furosemide (Lasix)) 30 tablet 11  . pramipexole (MIRAPEX) 1 MG tablet Take 2 mg by mouth at bedtime.      . simvastatin (ZOCOR) 10 MG tablet Take 10 mg by mouth daily.      . vitamin C (ASCORBIC ACID) 500 MG tablet Take 500 mg by mouth daily.     No current facility-administered medications for this visit.     Past Medical History  Diagnosis Date  . Coronary artery disease   . Hypertension   . Pacemaker-St Judes 07/16/2011  . Mixed hyperlipidemia   . Pure hypercholesterolemia   . Coronary atherosclerosis of unspecified type of vessel, native or graft   . Type II or unspecified type diabetes mellitus without mention of complication, not stated as uncontrolled   . Peripheral neuropathy ([place])   . Other postoperative functional disorders   . Insomnia, unspecified   . Unspecified sleep apnea   . Thoracic aneurysm without  mention of rupture     Measured at 45 mm in the past. Consider repeat MRA in 2015.  [name] Hyperglycemia   . Restless leg syndrome   . Ascending aorta dilatation (HCC)     Fusiform dilatation ascending aorta 44 x 45 mm - 06/2007  . Heart murmur 04/25/09    Mild aortic calcification on echo 07/03/2009  . Spermatocele     near left testicle 2 cm  . Gilbert's syndrome   . Spinal stenosis, lumbar   . Thrombocytopenia ([place])     Averaging 140,000 since 2008  . Tubular adenoma 09/2012    Dr. Watt Climes repeat 4 years  . Complete heart block (HCC)     Treated with pacemaker in 02/2011 - Dr. [name]    ROS:   All systems reviewed and negative except as noted in the HPI.   Past Surgical History  Procedure Laterality  Date  . Coronary stent placement    . Pacermaker  07/16/11    Dr. [name]  . Hernia repair Bilateral   . Cholecystectomy  1988  . Appendectomy  1957  . Hemorroidectomy  1980  . Tee without cardioversion N/A 06/14/2014    Procedure: TRANSESOPHAGEAL ECHOCARDIOGRAM (TEE);  Surgeon: [name], MD;  Location: [facility] ENDOSCOPY;  Service: Cardiovascular;  Laterality: N/A;     Family History  Problem Relation Age of Onset  . Cancer Mother     Small cell lung cancer  . Diabetes Father     DM  . CAD Father   . Hypertension Father   . Heart disease Father   . Hypertension Sister      Social History   Social History  . Marital Status: Married    Spouse Name: N/A  . Number of Children: N/A  . Years of Education: N/A   Occupational History  . Not on file.   Social History Main Topics  . Smoking status: Former Research scientist (life sciences)  . Smokeless tobacco: Not on file  . Alcohol Use: Yes     Comment: occasional glass of wine  . Drug Use: No  . Sexual Activity: Not on file   Other Topics Concern  . Not on file   Social History Narrative     BP 144/64 mmHg  Pulse 76  Ht 6' (1.829 m)  Wt 179 lb 3.2 oz (81.285 kg)  BMI 24.30 kg/m2  Physical Exam:  Well appearing 75 yo man, NAD HEENT: Unremarkable Neck:  6 cm JVD, no thyromegally Back:  No CVA tenderness Lungs:  Clear with no wheezes, rales, or rhonchi, well healed PPM incision. HEART:  Regular rate rhythm, no murmurs, no rubs, no clicks Abd:  soft, positive bowel sounds, no organomegally, no rebound, no guarding Ext:  2 plus pulses, no edema, no cyanosis, no clubbing Skin:  No rashes no nodules Neuro:  CN II through XII intact, motor grossly intact   DEVICE  Normal device function.  See PaceArt for details.   A/P 1. Complete heart block - he is asymptomatic s/p PPM insertion 2. PPM - his [employer] Jude DDD PM is working normally. Will recheck in several months 3. HTN - his blood pressure is slightly elevated but has been controlled. 4.  Chronic diastolic heart failure - his symptoms are class 2. He has been encouraged to maintain a low sodium diet. We discussed the importance of diuretic therapy for fluid overload.   [name].D.

## 2015-08-18 DIAGNOSIS — H04123 Dry eye syndrome of bilateral lacrimal glands: Secondary | ICD-10-CM | POA: Diagnosis not present

## 2015-08-18 DIAGNOSIS — H01001 Unspecified blepharitis right upper eyelid: Secondary | ICD-10-CM | POA: Diagnosis not present

## 2015-08-18 DIAGNOSIS — Z0100 Encounter for examination of eyes and vision without abnormal findings: Secondary | ICD-10-CM | POA: Diagnosis not present

## 2015-08-18 DIAGNOSIS — H26491 Other secondary cataract, right eye: Secondary | ICD-10-CM | POA: Diagnosis not present

## 2015-09-07 ENCOUNTER — Other Ambulatory Visit: Payer: Self-pay | Admitting: Interventional Cardiology

## 2015-09-14 DIAGNOSIS — H26491 Other secondary cataract, right eye: Secondary | ICD-10-CM | POA: Diagnosis not present

## 2015-09-20 ENCOUNTER — Encounter: Payer: Self-pay | Admitting: Interventional Cardiology

## 2015-09-20 ENCOUNTER — Ambulatory Visit (INDEPENDENT_AMBULATORY_CARE_PROVIDER_SITE_OTHER): Payer: Medicare HMO | Admitting: Interventional Cardiology

## 2015-09-20 VITALS — BP 104/52 | HR 65 | Ht 72.0 in | Wt 181.8 lb

## 2015-09-20 DIAGNOSIS — I359 Nonrheumatic aortic valve disorder, unspecified: Secondary | ICD-10-CM

## 2015-09-20 DIAGNOSIS — I5033 Acute on chronic diastolic (congestive) heart failure: Secondary | ICD-10-CM

## 2015-09-20 DIAGNOSIS — I480 Paroxysmal atrial fibrillation: Secondary | ICD-10-CM

## 2015-09-20 DIAGNOSIS — I2510 Atherosclerotic heart disease of native coronary artery without angina pectoris: Secondary | ICD-10-CM

## 2015-09-20 DIAGNOSIS — Z950 Presence of cardiac pacemaker: Secondary | ICD-10-CM | POA: Diagnosis not present

## 2015-09-20 DIAGNOSIS — I5032 Chronic diastolic (congestive) heart failure: Secondary | ICD-10-CM | POA: Insufficient documentation

## 2015-09-20 NOTE — Progress Notes (Signed)
Patient ID: David [name], male   DOB: 04-22-1941, 75 y.o.   MRN: [id number]     Cardiology Office Note   Date:  09/20/2015   ID:  [name], DOB November 17, 1940, MRN [id number]  PCP:  Mathews Argyle, MD    Chief Complaint  Patient presents with  . Leg Swelling    LEFT AND RIGHT LOWER EXTREMITY     Wt Readings from Last 3 Encounters:  09/20/15 181 lb 12.8 oz (82.464 kg)  07/13/15 179 lb 3.2 oz (81.285 kg)  02/02/15 172 lb (78.019 kg)       History of Present Illness: David [name] is a 75 y.o. male  who had a pacemaker placed in 2012 for complete heart block. He does have a history of coronary artery disease and atrial fibrillation. He had an LAD atherectomy in 1999. He was started on Eliquis after A. fib was found on his pacemaker check.  He has had diastolic heart failure. Over the last few days, he has had leg swelling. He has eaten more salt than usual. He does not elevate his legs. He does not wear compression stockings, which he does already have. He has been taking his Lasix only as needed. He takes his potassium when he takes his Lasix. He feels that he takes the Lasix about once every 2 weeks. He has restarted Lasix for the last 2 days and has dropped 1 pound at home. The leg swelling is still prominent. He is here for further evaluation.    Past Medical History  Diagnosis Date  . Coronary artery disease   . Hypertension   . Pacemaker-[employer]es 07/16/2011  . Mixed hyperlipidemia   . Pure hypercholesterolemia   . Coronary atherosclerosis of unspecified type of vessel, native or graft   . Type II or unspecified type diabetes mellitus without mention of complication, not stated as uncontrolled   . Peripheral neuropathy ([place])   . Other postoperative functional disorders   . Insomnia, unspecified   . Unspecified sleep apnea   . Thoracic aneurysm without mention of rupture     Measured at 45 mm in the past. Consider repeat MRA in 2015.  [name] Hyperglycemia   . Restless  leg syndrome   . Ascending aorta dilatation (HCC)     Fusiform dilatation ascending aorta 44 x 45 mm - 06/2007  . Heart murmur 04/25/09    Mild aortic calcification on echo 07/03/2009  . Spermatocele     near left testicle 2 cm  . Gilbert's syndrome   . Spinal stenosis, lumbar   . Thrombocytopenia ([place])     Averaging 140,000 since 2008  . Tubular adenoma 09/2012    Dr. Watt Climes repeat 4 years  . Complete heart block (HCC)     Treated with pacemaker in 02/2011 - Dr. [name]    Past Surgical History  Procedure Laterality Date  . Coronary stent placement    . Pacermaker  07/16/11    Dr. [name]  . Hernia repair Bilateral   . Cholecystectomy  1988  . Appendectomy  1957  . Hemorroidectomy  1980  . Tee without cardioversion N/A 06/14/2014    Procedure: TRANSESOPHAGEAL ECHOCARDIOGRAM (TEE);  Surgeon: [name], MD;  Location: [facility] ENDOSCOPY;  Service: Cardiovascular;  Laterality: N/A;     Current Outpatient Prescriptions  Medication Sig Dispense Refill  . acetaminophen (TYLENOL) 500 MG tablet Take 500 mg by mouth every 6 (six) hours as needed for mild pain.    [name] apixaban (ELIQUIS) 5 MG TABS  tablet Take 1 tablet (5 mg total) by mouth 2 (two) times daily. 60 tablet 1  . cholecalciferol (VITAMIN D) 1000 UNITS tablet Take 1,000 Units by mouth daily.    . Coenzyme Q10 (COQ10) 100 MG CAPS Take 100 mg by mouth daily.     . Cyanocobalamin (VITAMIN B-12) 2500 MCG SUBL Place 1 tablet under the tongue daily.    . famotidine (PEPCID) 10 MG tablet Take 10 mg by mouth 2 (two) times daily.     . fluticasone (FLONASE) 50 MCG/ACT nasal spray Place 1 spray into both nostrils as needed for allergies or rhinitis.     . furosemide (LASIX) 40 MG tablet Take 1 tablet as needed for for weight gain/edema 30 tablet 11  . metoprolol (LOPRESSOR) 50 MG tablet Take 25 mg by mouth 2 (two) times daily.     . Multiple Vitamin (MULTIVITAMIN) capsule TAKE 1 CAPSULE THREE TIMES A WEEK    . naproxen (NAPROSYN) 500 MG tablet Take  500 mg by mouth 2 (two) times daily as needed for mild pain.    . niacin (NIASPAN) 1000 MG CR tablet Take 1 tablet (1,000 mg total) by mouth daily. 30 tablet 6  . nitroGLYCERIN (NITROSTAT) 0.4 MG SL tablet Place 1 tablet (0.4 mg total) under the tongue every 5 (five) minutes as needed. For chest pain 25 tablet 5  . Omega-3 Fatty Acids (FISH OIL PO) Take 1,000 Units by mouth daily. 1 capsule with a meal    . potassium chloride (K-DUR) 10 MEQ tablet Take 10 mEq by mouth daily. every time you take furosemide (Lasix)    . pramipexole (MIRAPEX) 1 MG tablet Take 2 mg by mouth at bedtime.      . simvastatin (ZOCOR) 10 MG tablet Take 10 mg by mouth daily.      . vitamin C (ASCORBIC ACID) 500 MG tablet Take 500 mg by mouth daily.     No current facility-administered medications for this visit.    Allergies:   Benadryl; Antihistamines, chlorpheniramine-type; and Restoril    Social History:  The patient  reports that he has quit smoking. He does not have any smokeless tobacco history on file. He reports that he drinks alcohol. He reports that he does not use illicit drugs.   Family History:  The patient's family history includes CAD in his father; Cancer in his mother; Diabetes in his father; Heart attack in his father; Heart disease in his father; Hypertension in his father and sister.    ROS:  Please see the history of present illness.   Otherwise, review of systems are positive for leg swelling.   All other systems are reviewed and negative.    PHYSICAL EXAM: VS:  BP 104/52 mmHg  Pulse 65  Ht 6' (1.829 m)  Wt 181 lb 12.8 oz (82.464 kg)  BMI 24.65 kg/m2  SpO2 98% , BMI Body mass index is 24.65 kg/(m^2). GEN: Well nourished, well developed, in no acute distress HEENT: normal Neck: no JVD, carotid bruits, or masses Cardiac: RRR for the most part, periods of irregular heartbeat; no murmurs, rubs, or gallops, 2+ bilateral pitting edema  Respiratory:  clear to auscultation bilaterally, normal  work of breathing GI: soft, nontender, nondistended, + BS MS: no deformity or atrophy Skin: warm and dry, no rash Neuro:  Strength and sensation are intact Psych: euthymic mood, full affect    Recent Labs: 12/29/2014: B Natriuretic Peptide 660.8* 12/30/2014: BUN 17; Creatinine, Ser 1.28*; Hemoglobin 13.2; Platelets 124*; Potassium 3.6;  Sodium 138 05/04/2015: ALT 35   Lipid Panel    Component Value Date/Time   CHOL 119* 05/04/2015 0818   TRIG 62 05/04/2015 0818   HDL 58 05/04/2015 0818   CHOLHDL 2.1 05/04/2015 0818   VLDL 12 05/04/2015 0818   LDLCALC 49 05/04/2015 0818     Other studies Reviewed: Additional studies/ records that were reviewed today with results demonstrating: Normal ejection fraction in August 2016.   ASSESSMENT AND PLAN:  1. Acute on chronic diastolic heart failure/lower extremity edema.: I recommended that he continue with the Lasix. He should take this for at least 3-4 more days depending on how the swelling does. He should elevate his legs. He should wear compression stockings.  2. CAD: No anginal symptoms recently. 3. History of pacemaker 4. Atrial fibrillation: Anticoagulation with Eliquis for stroke prevention. 5. Bicuspid aortic valve. No significant stenosis by last echo.   Current medicines are reviewed at length with the patient today.  The patient concerns regarding his medicines were addressed.  The following changes have been made:  Continue Lasix for a few days  Labs/ tests ordered today include:  No orders of the defined types were placed in this encounter.    Recommend 150 minutes/week of aerobic exercise Low fat, low carb, high fiber diet recommended  Disposition:   FU in 6 months or sooner if swelling persists   Teresita Madura., MD  09/20/2015 12:18 PM    [facility] [place], [place], [place]  [address] Phone: [phone]; Fax: [phone]

## 2015-09-20 NOTE — Patient Instructions (Signed)
Medication Instructions:  Take Lasix daily for the next 3 to 4 days-All of your other medications remain the same  Labwork: None  Testing/Procedures: None  Follow-Up: Your physician wants you to follow-up in: 6 months. You will receive a reminder letter in the mail two months in advance. If you don't receive a letter, please call our office to schedule the follow-up appointment.   Any Other Special Instructions Will Be Listed Below (If Applicable). Please call Jeani Hawking, Dr Hassell Done nurse, at [phone] in a couple days to let us know how your swelling is.     If you need a refill on your cardiac medications before your next appointment, please call your pharmacy.

## 2015-11-22 DIAGNOSIS — I480 Paroxysmal atrial fibrillation: Secondary | ICD-10-CM | POA: Diagnosis not present

## 2015-11-22 DIAGNOSIS — I77810 Thoracic aortic ectasia: Secondary | ICD-10-CM | POA: Diagnosis not present

## 2015-11-22 DIAGNOSIS — E7800 Pure hypercholesterolemia, unspecified: Secondary | ICD-10-CM | POA: Diagnosis not present

## 2015-11-22 DIAGNOSIS — E119 Type 2 diabetes mellitus without complications: Secondary | ICD-10-CM | POA: Diagnosis not present

## 2015-11-22 DIAGNOSIS — D696 Thrombocytopenia, unspecified: Secondary | ICD-10-CM | POA: Diagnosis not present

## 2015-11-22 DIAGNOSIS — J301 Allergic rhinitis due to pollen: Secondary | ICD-10-CM | POA: Diagnosis not present

## 2015-12-06 ENCOUNTER — Ambulatory Visit (INDEPENDENT_AMBULATORY_CARE_PROVIDER_SITE_OTHER): Payer: Medicare HMO | Admitting: *Deleted

## 2015-12-06 DIAGNOSIS — I442 Atrioventricular block, complete: Secondary | ICD-10-CM

## 2015-12-06 DIAGNOSIS — Z950 Presence of cardiac pacemaker: Secondary | ICD-10-CM

## 2015-12-06 NOTE — Progress Notes (Signed)
Remote pacemaker transmission.   

## 2015-12-08 ENCOUNTER — Encounter: Payer: Self-pay | Admitting: Cardiology

## 2015-12-11 NOTE — Telephone Encounter (Signed)
Spoke w/ pt and informed him that he could disregard the sentence that stated he needed to call the office to schedule an appt. And that he will receive results via mychart once MD signs off on the report. Pt verbalized understanding.

## 2015-12-12 LAB — CUP PACEART REMOTE DEVICE CHECK
Battery Remaining Percentage: 73 %
Battery Voltage: 2.92 V
Brady Statistic AS VP Percent: 8.8 %
Brady Statistic RA Percent Paced: 89 %
Brady Statistic RV Percent Paced: 99 %
Date Time Interrogation Session: 20170712083705
Implantable Lead Implant Date: 20121031
Implantable Lead Location: 753859
Implantable Lead Location: 753860
Lead Channel Impedance Value: 440 Ohm
Lead Channel Pacing Threshold Amplitude: 0.5 V
Lead Channel Pacing Threshold Pulse Width: 0.5 ms
Lead Channel Sensing Intrinsic Amplitude: 12 mV
Lead Channel Setting Pacing Amplitude: 1.5 V
MDC IDC LEAD IMPLANT DT: 20121031
MDC IDC MSMT BATTERY REMAINING LONGEVITY: 82 mo
MDC IDC MSMT LEADCHNL RA IMPEDANCE VALUE: 440 Ohm
MDC IDC MSMT LEADCHNL RA SENSING INTR AMPL: 2.9 mV
MDC IDC MSMT LEADCHNL RV PACING THRESHOLD AMPLITUDE: 1.125 V
MDC IDC MSMT LEADCHNL RV PACING THRESHOLD PULSEWIDTH: 0.5 ms
MDC IDC SET LEADCHNL RV PACING AMPLITUDE: 1.375
MDC IDC SET LEADCHNL RV PACING PULSEWIDTH: 0.5 ms
MDC IDC SET LEADCHNL RV SENSING SENSITIVITY: 4 mV
MDC IDC STAT BRADY AP VP PERCENT: 91 %
MDC IDC STAT BRADY AP VS PERCENT: 1 %
MDC IDC STAT BRADY AS VS PERCENT: 1 %
Pulse Gen Model: 2210
Pulse Gen Serial Number: 7280216

## 2016-01-11 ENCOUNTER — Encounter: Payer: Self-pay | Admitting: Internal Medicine

## 2016-01-11 ENCOUNTER — Ambulatory Visit (INDEPENDENT_AMBULATORY_CARE_PROVIDER_SITE_OTHER): Payer: Medicare HMO | Admitting: *Deleted

## 2016-01-11 DIAGNOSIS — I442 Atrioventricular block, complete: Secondary | ICD-10-CM

## 2016-01-12 LAB — CUP PACEART INCLINIC DEVICE CHECK
Battery Voltage: 2.92 V
Brady Statistic RA Percent Paced: 90 %
Brady Statistic RV Percent Paced: 99.97 %
Date Time Interrogation Session: 20170817154359
Implantable Lead Implant Date: 20121031
Implantable Lead Implant Date: 20121031
Implantable Lead Location: 753859
Lead Channel Impedance Value: 437.5 Ohm
Lead Channel Pacing Threshold Amplitude: 0.5 V
Lead Channel Pacing Threshold Amplitude: 1.25 V
Lead Channel Pacing Threshold Pulse Width: 0.5 ms
Lead Channel Pacing Threshold Pulse Width: 0.5 ms
Lead Channel Sensing Intrinsic Amplitude: 12 mV
Lead Channel Setting Pacing Amplitude: 1.5 V
Lead Channel Setting Pacing Pulse Width: 0.5 ms
Lead Channel Setting Sensing Sensitivity: 4 mV
MDC IDC LEAD LOCATION: 753860
MDC IDC MSMT BATTERY REMAINING LONGEVITY: 76.8
MDC IDC MSMT LEADCHNL RA IMPEDANCE VALUE: 412.5 Ohm
MDC IDC MSMT LEADCHNL RA PACING THRESHOLD AMPLITUDE: 0.5 V
MDC IDC MSMT LEADCHNL RA PACING THRESHOLD PULSEWIDTH: 0.5 ms
MDC IDC MSMT LEADCHNL RA SENSING INTR AMPL: 1.8 mV
MDC IDC MSMT LEADCHNL RV PACING THRESHOLD AMPLITUDE: 1.25 V
MDC IDC MSMT LEADCHNL RV PACING THRESHOLD PULSEWIDTH: 0.5 ms
MDC IDC SET LEADCHNL RV PACING AMPLITUDE: 1.375
Pulse Gen Model: 2210
Pulse Gen Serial Number: 7280216

## 2016-01-12 NOTE — Progress Notes (Signed)
Pacemaker check in clinic. Normal device function. Thresholds, sensing, impedances consistent with previous measurements. Device programmed to maximize longevity. 1.4% AT/AF burden--max dur. 7hrs 68mins (7/8) + Eliquis. No high ventricular rates noted. (394) PMT episodes--VA 355ms--PVARP increased to 365ms. Device programmed at appropriate safety margins. Histogram distribution appropriate for patient activity level. Device programmed to optimize intrinsic conduction. Estimated longevity 6.4-7.2 years. Patient will follow up via [facility] on 11/16 and with GT in 06-2016.

## 2016-01-31 ENCOUNTER — Ambulatory Visit
Admission: RE | Admit: 2016-01-31 | Discharge: 2016-01-31 | Disposition: A | Discharge status: Home / Self Care | Payer: Medicare HMO | Source: Ambulatory Visit | Attending: Interventional Cardiology | Admitting: Interventional Cardiology

## 2016-01-31 ENCOUNTER — Other Ambulatory Visit: Payer: Self-pay

## 2016-01-31 ENCOUNTER — Other Ambulatory Visit: Payer: Medicare HMO | Admitting: *Deleted

## 2016-01-31 ENCOUNTER — Telehealth: Payer: Self-pay | Admitting: Interventional Cardiology

## 2016-01-31 DIAGNOSIS — R0602 Shortness of breath: Secondary | ICD-10-CM | POA: Diagnosis not present

## 2016-01-31 DIAGNOSIS — I350 Nonrheumatic aortic (valve) stenosis: Secondary | ICD-10-CM

## 2016-01-31 LAB — CBC WITH DIFFERENTIAL/PLATELET
BASOS ABS: 0 {cells}/uL (ref 0–200)
Basophils Relative: 0 %
EOS ABS: 345 {cells}/uL (ref 15–500)
Eosinophils Relative: 5 %
HEMATOCRIT: 43.1 % (ref 38.5–50.0)
Hemoglobin: 14.5 g/dL (ref 13.2–17.1)
LYMPHS PCT: 27 %
Lymphs Abs: 1863 cells/uL (ref 850–3900)
MCH: 32.2 pg (ref 27.0–33.0)
MCHC: 33.6 g/dL (ref 32.0–36.0)
MCV: 95.6 fL (ref 80.0–100.0)
MONO ABS: 621 {cells}/uL (ref 200–950)
MONOS PCT: 9 %
MPV: 11.1 fL (ref 7.5–12.5)
Neutro Abs: 4071 cells/uL (ref 1500–7800)
Neutrophils Relative %: 59 %
PLATELETS: 153 10*3/uL (ref 140–400)
RBC: 4.51 MIL/uL (ref 4.20–5.80)
RDW: 13.4 % (ref 11.0–15.0)
WBC: 6.9 10*3/uL (ref 3.8–10.8)

## 2016-01-31 NOTE — Telephone Encounter (Signed)
Pt c/o Shortness Of Breath: STAT if SOB developed within the last 24 hours or pt is noticeably SOB on the phone  1. Are you currently SOB (can you hear that pt is SOB on the phone)? No wife is on the phone she said it is not an emergency   2. How long have you been experiencing SOB? 2 weeks   3. Are you SOB when sitting or when up moving around?  Up moving around, going up the stairs   4. Are you currently experiencing any other symptoms? Tired

## 2016-01-31 NOTE — Telephone Encounter (Addendum)
**Note De-Identified Skyllar Notarianni Obfuscation** The pts wife, Santiago Glad, states that the pt has been more SOB, tired and fatigued for the past couple of weeks.  She states that they do not know his weight as he has not been weighing regularly.  She reports that he does have edema in his feet and ankles but no worse than normal for him.  She states that she feels this is more of a heart issue than a fluid issue at this time.  She wants to know what she should do.  Please advise.

## 2016-01-31 NOTE — Telephone Encounter (Signed)
Follow up     Patient wife wants you to call her on cell phone

## 2016-02-01 LAB — BASIC METABOLIC PANEL
BUN: 26 mg/dL — AB (ref 7–25)
CHLORIDE: 106 mmol/L (ref 98–110)
CO2: 23 mmol/L (ref 20–31)
CREATININE: 1.3 mg/dL — AB (ref 0.70–1.18)
Calcium: 9.3 mg/dL (ref 8.6–10.3)
Glucose, Bld: 99 mg/dL (ref 65–99)
POTASSIUM: 4.3 mmol/L (ref 3.5–5.3)
Sodium: 140 mmol/L (ref 135–146)

## 2016-02-01 LAB — BRAIN NATRIURETIC PEPTIDE: Brain Natriuretic Peptide: 252.6 pg/mL — ABNORMAL HIGH (ref <100)

## 2016-02-02 ENCOUNTER — Encounter: Payer: Self-pay | Admitting: Interventional Cardiology

## 2016-02-08 ENCOUNTER — Ambulatory Visit (HOSPITAL_COMMUNITY): Payer: Medicare HMO | Attending: Cardiology

## 2016-02-08 ENCOUNTER — Other Ambulatory Visit: Payer: Self-pay

## 2016-02-08 DIAGNOSIS — I350 Nonrheumatic aortic (valve) stenosis: Secondary | ICD-10-CM | POA: Insufficient documentation

## 2016-02-08 DIAGNOSIS — I272 Other secondary pulmonary hypertension: Secondary | ICD-10-CM | POA: Diagnosis not present

## 2016-02-08 DIAGNOSIS — I517 Cardiomegaly: Secondary | ICD-10-CM | POA: Diagnosis not present

## 2016-02-08 DIAGNOSIS — I700 Atherosclerosis of aorta: Secondary | ICD-10-CM | POA: Diagnosis not present

## 2016-03-27 DIAGNOSIS — Z23 Encounter for immunization: Secondary | ICD-10-CM | POA: Diagnosis not present

## 2016-04-03 NOTE — Progress Notes (Signed)
Patient ID: Koury Zamora, male   DOB: 1940-07-29, 75 y.o.   MRN: [id number]     Cardiology Office Note   Date:  04/04/2016   ID:  [name], DOB Aug 14, 1940, MRN [id number]  PCP:  Mathews Argyle, MD    Chief Complaint  Patient presents with  . abnormal echo  f/u CAD   Wt Readings from Last 3 Encounters:  04/04/16 81.7 kg (180 lb 1.9 oz)  09/20/15 82.5 kg (181 lb 12.8 oz)  07/13/15 81.3 kg (179 lb 3.2 oz)       History of Present Illness: Judy Germer is a 75 y.o. male  who had a pacemaker placed in 2012 for complete heart block. He does have a history of coronary artery disease and atrial fibrillation. He had an LAD atherectomy in 1999. He was started on Eliquis after A. fib was found on his pacemaker check.  He has had diastolic heart failure. In 4/17, he has had leg swelling. He has eaten more salt than usual. He does not elevate his legs. He does not wear compression stockings, which he does already have. He has been taking his Lasix only as needed. He takes his potassium when he takes his Lasix. He feels that he takes the Lasix about once every 2 weeks. He has restarted Lasix for the last 2 days and has dropped 1 pound at home. The leg swelling is still prominent. He is here for further evaluation.  In 01/2016, he had some additional fatigue.  He took some extra Lasix.  His energy level improved.  He had an echo showing no signficant change from prior. Normal LV function with mild AS.  Overall, he has been doing well. He rode his motorcycle from [place] to [place] and back several weeks ago. He had no problems. He still has occasional leg swelling. He has not been elevating his legs regularly. He has not been taking Lasix regularly.    Past Medical History:  Diagnosis Date  . Ascending aorta dilatation (HCC)    Fusiform dilatation ascending aorta 44 x 45 mm - 06/2007  . Complete heart block (HCC)    Treated with pacemaker in 02/2011 - Dr. [name]  . Coronary  artery disease   . Coronary atherosclerosis of unspecified type of vessel, native or graft   . Gilbert's syndrome   . Heart murmur 04/25/09   Mild aortic calcification on echo 07/03/2009  . Hyperglycemia   . Hypertension   . Insomnia, unspecified   . Mixed hyperlipidemia   . Other postoperative functional disorders   . Pacemaker-[employer]es 07/16/2011  . Peripheral neuropathy ([place])   . Pure hypercholesterolemia   . Restless leg syndrome   . Spermatocele    near left testicle 2 cm  . Spinal stenosis, lumbar   . Thoracic aneurysm without mention of rupture    Measured at 45 mm in the past. Consider repeat MRA in 2015.  [name] Thrombocytopenia ([place])    Averaging 140,000 since 2008  . Tubular adenoma 09/2012   Dr. Watt Climes repeat 4 years  . Type II or unspecified type diabetes mellitus without mention of complication, not stated as uncontrolled   . Unspecified sleep apnea     Past Surgical History:  Procedure Laterality Date  . APPENDECTOMY  1957  . CHOLECYSTECTOMY  1988  . CORONARY STENT PLACEMENT    . HEMORROIDECTOMY  1980  . HERNIA REPAIR Bilateral   . pacermaker  07/16/11   Dr. [name]  . TEE WITHOUT CARDIOVERSION  N/A 06/14/2014   Procedure: TRANSESOPHAGEAL ECHOCARDIOGRAM (TEE);  Surgeon: [name], MD;  Location: [facility] ENDOSCOPY;  Service: Cardiovascular;  Laterality: N/A;     Current Outpatient Prescriptions  Medication Sig Dispense Refill  . acetaminophen (TYLENOL) 500 MG tablet Take 500 mg by mouth every 6 (six) hours as needed for mild pain.    [name] apixaban (ELIQUIS) 5 MG TABS tablet Take 1 tablet (5 mg total) by mouth 2 (two) times daily. 60 tablet 1  . cholecalciferol (VITAMIN D) 1000 UNITS tablet Take 1,000 Units by mouth daily.    . Coenzyme Q10 (COQ10) 100 MG CAPS Take 100 mg by mouth daily.     . Cyanocobalamin (VITAMIN B-12) 2500 MCG SUBL Place 1 tablet under the tongue daily.    . famotidine (PEPCID) 10 MG tablet Take 10 mg by mouth daily.     . fluticasone (FLONASE) 50  MCG/ACT nasal spray Place 1 spray into both nostrils as needed for allergies or rhinitis.     . furosemide (LASIX) 40 MG tablet Take 1 tablet twice a week 10 tablet 11  . loratadine (CLARITIN) 10 MG tablet Take 10 mg by mouth daily as needed for allergies.    . metoprolol (LOPRESSOR) 50 MG tablet Take 25 mg by mouth 2 (two) times daily.     . Multiple Vitamin (MULTIVITAMIN) capsule TAKE 1 CAPSULE THREE TIMES A WEEK    . naproxen (NAPROSYN) 500 MG tablet Take 500 mg by mouth 2 (two) times daily as needed for mild pain.    . niacin (NIASPAN) 1000 MG CR tablet Take 1 tablet (1,000 mg total) by mouth daily. 30 tablet 6  . nitroGLYCERIN (NITROSTAT) 0.4 MG SL tablet Place 1 tablet (0.4 mg total) under the tongue every 5 (five) minutes as needed. For chest pain 25 tablet 5  . Omega-3 Fatty Acids (FISH OIL PO) Take 1,000 Units by mouth daily. 1 capsule with a meal    . potassium chloride (K-DUR) 10 MEQ tablet Take 10 mEq by mouth daily. every time you take furosemide (Lasix)    . pramipexole (MIRAPEX) 1 MG tablet Take 2 mg by mouth at bedtime.      . simvastatin (ZOCOR) 10 MG tablet Take 10 mg by mouth daily.      . vitamin C (ASCORBIC ACID) 500 MG tablet Take 500 mg by mouth daily.     No current facility-administered medications for this visit.     Allergies:   Benadryl [diphenhydramine hcl]; Antihistamines, chlorpheniramine-type; and Restoril [temazepam]    Social History:  The patient  reports that he has quit smoking. He has never used smokeless tobacco. He reports that he drinks alcohol. He reports that he does not use drugs.   Family History:  The patient's family history includes CAD in his father; Cancer in his mother; Diabetes in his father; Heart attack in his father; Heart disease in his father; Hypertension in his father and sister.    ROS:  Please see the history of present illness.   Otherwise, review of systems are positive for leg swelling.   All other systems are reviewed and  negative.    PHYSICAL EXAM: VS:  BP 120/64   Pulse 80   Ht 6' (1.829 m)   Wt 81.7 kg (180 lb 1.9 oz)   SpO2 97%   BMI 24.43 kg/m  , BMI Body mass index is 24.43 kg/m. GEN: Well nourished, well developed, in no acute distress  HEENT: normal  Neck: no JVD, carotid  bruits, or masses Cardiac: RRR for the most part, periods of irregular heartbeat; no murmurs, rubs, or gallops, 2+ bilateral pitting edema - worse at the ankles- cyst noted in the lop of the left foot Respiratory:  clear to auscultation bilaterally, normal work of breathing GI: soft, nontender, nondistended, + BS MS: no deformity or atrophy  Skin: warm and dry, no rash Neuro:  Strength and sensation are intact Psych: euthymic mood, full affect  ECG: AV paced  Recent Labs: 05/04/2015: ALT 35 01/31/2016: Brain Natriuretic Peptide 252.6; BUN 26; Creat 1.30; Hemoglobin 14.5; Platelets 153; Potassium 4.3; Sodium 140   Lipid Panel    Component Value Date/Time   CHOL 119 (L) 05/04/2015 0818   TRIG 62 05/04/2015 0818   HDL 58 05/04/2015 0818   CHOLHDL 2.1 05/04/2015 0818   VLDL 12 05/04/2015 0818   LDLCALC 49 05/04/2015 0818     Other studies Reviewed: Additional studies/ records that were reviewed today with results demonstrating: Normal ejection fraction in August 2016.   ASSESSMENT AND PLAN:  1. Chronic diastolic heart failure/lower extremity edema.: I recommended that he continue with the Lasix. He should take this for at least 3-4 more days depending on how the swelling does. He should elevate his legs. He should wear compression stockings.  2. CAD: No anginal symptoms recently.  Had fatigue in September 2017 which is better.  We discussed stress testing at length. He feels back to normal. He does not want to do stress test at this time. If symptoms change, he will let us know. 3. History of pacemaker 4. Atrial fibrillation: Anticoagulation with Eliquis for stroke prevention. 5. Calcified aortic valve. No  significant stenosis by last echo.  Normal LV function. 6. Leg edema: Elevate legs. I encouraged him to use his compression stockings. He should probably use Lasix at least 1-2 per week. He'll let us know if the fatigue returns.   Current medicines are reviewed at length with the patient today.  The patient concerns regarding his medicines were addressed.  The following changes have been made:  Continue Lasix for a few days  Labs/ tests ordered today include:   Orders Placed This Encounter  Procedures  . EKG 12-Lead    Recommend 150 minutes/week of aerobic exercise Low fat, low carb, high fiber diet recommended  Disposition:   FU in 6 months or sooner if swelling persists   Signed, Larae Grooms, MD  04/04/2016 12:57 PM    [facility] [place], [place], [place]  [address] Phone: [phone]; Fax: [phone]

## 2016-04-04 ENCOUNTER — Ambulatory Visit (INDEPENDENT_AMBULATORY_CARE_PROVIDER_SITE_OTHER): Payer: Medicare HMO | Admitting: Interventional Cardiology

## 2016-04-04 ENCOUNTER — Encounter: Payer: Self-pay | Admitting: Interventional Cardiology

## 2016-04-04 VITALS — BP 120/64 | HR 80 | Ht 72.0 in | Wt 180.1 lb

## 2016-04-04 DIAGNOSIS — I4891 Unspecified atrial fibrillation: Secondary | ICD-10-CM

## 2016-04-04 DIAGNOSIS — R600 Localized edema: Secondary | ICD-10-CM

## 2016-04-04 DIAGNOSIS — Z7901 Long term (current) use of anticoagulants: Secondary | ICD-10-CM

## 2016-04-04 DIAGNOSIS — R931 Abnormal findings on diagnostic imaging of heart and coronary circulation: Secondary | ICD-10-CM

## 2016-04-04 DIAGNOSIS — I25110 Atherosclerotic heart disease of native coronary artery with unstable angina pectoris: Secondary | ICD-10-CM

## 2016-04-04 MED ORDER — FUROSEMIDE 40 MG PO TABS: ORAL_TABLET | ORAL | 11 refills | Status: DC

## 2016-04-04 NOTE — Patient Instructions (Addendum)
**Note De-Identified Charlsey Moragne Obfuscation** Medication Instructions:  Increase Furosemide to taking 1 tablet twice weekly.  Labwork: None  Testing/Procedures: None  Follow-Up: Your physician wants you to follow-up in: 6 months. You will receive a reminder letter in the mail two months in advance. If you don't receive a letter, please call our office to schedule the follow-up appointment.   Any Other Special Instructions Will Be Listed Below (If Applicable). Continue to weigh yourself daily if you gain 3 lbs in 24 hours or 5 lbs in a week please call us at [phone].   If you need a refill on your cardiac medications before your next appointment, please call your pharmacy.

## 2016-04-10 ENCOUNTER — Other Ambulatory Visit: Payer: Self-pay

## 2016-04-10 ENCOUNTER — Telehealth: Payer: Self-pay | Admitting: *Deleted

## 2016-04-10 MED ORDER — POTASSIUM CHLORIDE ER 10 MEQ PO TBCR: EXTENDED_RELEASE_TABLET | ORAL | 11 refills | Status: DC

## 2016-04-10 NOTE — Telephone Encounter (Signed)
[facility] on [address] sent a fax stating that the patient requests an rx for potassium to take along with the furosemide. Please advise. Thanks, MI

## 2016-04-10 NOTE — Telephone Encounter (Signed)
I have sent RX to the pts pharmacy for Potassium 10 meq #10 with 11 refills. Thanks.

## 2016-04-11 ENCOUNTER — Ambulatory Visit (INDEPENDENT_AMBULATORY_CARE_PROVIDER_SITE_OTHER): Payer: Medicare HMO | Admitting: *Deleted

## 2016-04-11 DIAGNOSIS — I442 Atrioventricular block, complete: Secondary | ICD-10-CM | POA: Diagnosis not present

## 2016-04-11 NOTE — Progress Notes (Signed)
Remote pacemaker transmission.   

## 2016-04-17 ENCOUNTER — Encounter: Payer: Self-pay | Admitting: Cardiology

## 2016-05-03 DIAGNOSIS — L570 Actinic keratosis: Secondary | ICD-10-CM | POA: Diagnosis not present

## 2016-05-04 LAB — CUP PACEART REMOTE DEVICE CHECK
Battery Voltage: 2.9 V
Brady Statistic AP VP Percent: 96 %
Brady Statistic AS VS Percent: 1 %
Brady Statistic RV Percent Paced: 99 %
Date Time Interrogation Session: 20171116071826
Implantable Lead Implant Date: 20121031
Implantable Lead Location: 753860
Implantable Pulse Generator Implant Date: 20121031
Lead Channel Impedance Value: 440 Ohm
Lead Channel Pacing Threshold Amplitude: 0.5 V
Lead Channel Pacing Threshold Amplitude: 1.125 V
Lead Channel Pacing Threshold Pulse Width: 0.5 ms
Lead Channel Setting Pacing Amplitude: 1.375
Lead Channel Setting Pacing Pulse Width: 0.5 ms
MDC IDC LEAD IMPLANT DT: 20121031
MDC IDC LEAD LOCATION: 753859
MDC IDC MSMT BATTERY REMAINING LONGEVITY: 71 mo
MDC IDC MSMT BATTERY REMAINING PERCENTAGE: 65 %
MDC IDC MSMT LEADCHNL RA IMPEDANCE VALUE: 390 Ohm
MDC IDC MSMT LEADCHNL RA PACING THRESHOLD PULSEWIDTH: 0.5 ms
MDC IDC MSMT LEADCHNL RA SENSING INTR AMPL: 2.3 mV
MDC IDC MSMT LEADCHNL RV SENSING INTR AMPL: 12 mV
MDC IDC SET LEADCHNL RA PACING AMPLITUDE: 1.5 V
MDC IDC SET LEADCHNL RV SENSING SENSITIVITY: 4 mV
MDC IDC STAT BRADY AP VS PERCENT: 1 %
MDC IDC STAT BRADY AS VP PERCENT: 3.7 %
MDC IDC STAT BRADY RA PERCENT PACED: 92 %
Pulse Gen Model: 2210
Pulse Gen Serial Number: 7280216

## 2016-05-28 DIAGNOSIS — E7800 Pure hypercholesterolemia, unspecified: Secondary | ICD-10-CM | POA: Diagnosis not present

## 2016-05-28 DIAGNOSIS — Z0000 Encounter for general adult medical examination without abnormal findings: Secondary | ICD-10-CM | POA: Diagnosis not present

## 2016-05-28 DIAGNOSIS — Z79899 Other long term (current) drug therapy: Secondary | ICD-10-CM | POA: Diagnosis not present

## 2016-05-28 DIAGNOSIS — Z1211 Encounter for screening for malignant neoplasm of colon: Secondary | ICD-10-CM | POA: Diagnosis not present

## 2016-05-28 DIAGNOSIS — J300 Vasomotor rhinitis: Secondary | ICD-10-CM | POA: Diagnosis not present

## 2016-05-28 DIAGNOSIS — I480 Paroxysmal atrial fibrillation: Secondary | ICD-10-CM | POA: Diagnosis not present

## 2016-05-28 DIAGNOSIS — M5432 Sciatica, left side: Secondary | ICD-10-CM | POA: Diagnosis not present

## 2016-05-28 DIAGNOSIS — Z1389 Encounter for screening for other disorder: Secondary | ICD-10-CM | POA: Diagnosis not present

## 2016-05-28 DIAGNOSIS — I10 Essential (primary) hypertension: Secondary | ICD-10-CM | POA: Diagnosis not present

## 2016-05-28 DIAGNOSIS — D696 Thrombocytopenia, unspecified: Secondary | ICD-10-CM | POA: Diagnosis not present

## 2016-05-28 DIAGNOSIS — E119 Type 2 diabetes mellitus without complications: Secondary | ICD-10-CM | POA: Diagnosis not present

## 2016-05-30 ENCOUNTER — Telehealth: Payer: Self-pay | Admitting: Interventional Cardiology

## 2016-05-30 MED ORDER — FUROSEMIDE 40 MG PO TABS: ORAL_TABLET | ORAL | 3 refills | Status: DC

## 2016-05-30 MED ORDER — POTASSIUM CHLORIDE ER 10 MEQ PO TBCR: EXTENDED_RELEASE_TABLET | ORAL | 3 refills | Status: DC

## 2016-05-30 NOTE — Addendum Note (Signed)
Addended by: Roberts Gaudy on: 05/30/2016 11:30 AM   Modules accepted: Orders

## 2016-05-30 NOTE — Telephone Encounter (Signed)
New message       *STAT* If patient is at the pharmacy, call can be transferred to refill team.   1. Which medications need to be refilled? (please list name of each medication and dose if known)  Potassium 10 meq, furosemide 40mg   2. Which pharmacy/location (including street and city if local pharmacy) is medication to [facility] sent to[facility]  3. Do they need a 30 day or 90 day supply?  90 day

## 2016-07-12 ENCOUNTER — Ambulatory Visit (INDEPENDENT_AMBULATORY_CARE_PROVIDER_SITE_OTHER): Payer: Medicare HMO | Admitting: Internal Medicine

## 2016-07-12 ENCOUNTER — Encounter: Payer: Self-pay | Admitting: Internal Medicine

## 2016-07-12 VITALS — BP 126/74 | HR 60 | Ht 73.0 in | Wt 181.8 lb

## 2016-07-12 DIAGNOSIS — I442 Atrioventricular block, complete: Secondary | ICD-10-CM | POA: Diagnosis not present

## 2016-07-12 LAB — CUP PACEART INCLINIC DEVICE CHECK
Battery Voltage: 2.89 V
Implantable Lead Implant Date: 20121031
Implantable Pulse Generator Implant Date: 20121031
Lead Channel Pacing Threshold Amplitude: 0.5 V
Lead Channel Pacing Threshold Amplitude: 1 V
Lead Channel Pacing Threshold Pulse Width: 0.5 ms
Lead Channel Sensing Intrinsic Amplitude: 12 mV
Lead Channel Setting Pacing Amplitude: 1.25 V
Lead Channel Setting Pacing Pulse Width: 0.5 ms
Lead Channel Setting Sensing Sensitivity: 4 mV
MDC IDC LEAD IMPLANT DT: 20121031
MDC IDC LEAD LOCATION: 753859
MDC IDC LEAD LOCATION: 753860
MDC IDC MSMT LEADCHNL RA IMPEDANCE VALUE: 390 Ohm
MDC IDC MSMT LEADCHNL RA PACING THRESHOLD PULSEWIDTH: 0.5 ms
MDC IDC MSMT LEADCHNL RA SENSING INTR AMPL: 1.5 mV
MDC IDC MSMT LEADCHNL RV IMPEDANCE VALUE: 440 Ohm
MDC IDC PG SERIAL: 7280216
MDC IDC SESS DTM: 20180216141417
MDC IDC SET LEADCHNL RA PACING AMPLITUDE: 1.5 V
MDC IDC STAT BRADY RA PERCENT PACED: 93 %
MDC IDC STAT BRADY RV PERCENT PACED: 99.98 %
Pulse Gen Model: 2210

## 2016-07-12 NOTE — Progress Notes (Signed)
HPI David [name] returns today for evaluation and management of his DDD PPM. He has a h/o complete heart block and underwent PPM insertion over 5 years ago. He has done well. No syncope. He denies any CHF symptoms. He has developed mild peripheral edema. He drives a motorcycle. He remains acitve, exercising on a regular basis. No palpitations. He continues to have some problems with peripheral edema. He denies palpitations.  Allergies  Allergen Reactions  . Benadryl [Diphenhydramine Hcl] Other (See Comments)    Adverse reaction, has very strange behavior. Also hyperactive.  [name] Antihistamines, Chlorpheniramine-Type Palpitations  . Restoril [Temazepam] Other (See Comments) and Palpitations    Hyperactive     Current Outpatient Prescriptions  Medication Sig Dispense Refill  . acetaminophen (TYLENOL) 500 MG tablet Take 500 mg by mouth every 6 (six) hours as needed for mild pain.    [name] apixaban (ELIQUIS) 5 MG TABS tablet Take 1 tablet (5 mg total) by mouth 2 (two) times daily. 60 tablet 1  . cholecalciferol (VITAMIN D) 1000 UNITS tablet Take 1,000 Units by mouth daily.    . Coenzyme Q10 (COQ10) 100 MG CAPS Take 100 mg by mouth daily.     . Cyanocobalamin (VITAMIN B-12) 2500 MCG SUBL Place 1 tablet under the tongue daily.    . famotidine (PEPCID) 10 MG tablet Take 10 mg by mouth daily.     . fluticasone (FLONASE) 50 MCG/ACT nasal spray Place 1 spray into both nostrils daily as needed for allergies or rhinitis.     . furosemide (LASIX) 40 MG tablet Take 1 tablet by mouth 2 (two) times a week.  11  . loratadine (CLARITIN) 10 MG tablet Take 10 mg by mouth daily as needed for allergies.    . metoprolol (LOPRESSOR) 50 MG tablet Take 25 mg by mouth 2 (two) times daily.     . Multiple Vitamin (MULTIVITAMIN) capsule TAKE 1 CAPSULE BY MOUTH THREE TIMES A WEEK    . naproxen (NAPROSYN) 500 MG tablet Take 500 mg by mouth 2 (two) times daily as needed for mild pain.    . niacin (NIASPAN) 1000 MG CR  tablet Take 1 tablet (1,000 mg total) by mouth daily. 30 tablet 6  . nitroGLYCERIN (NITROSTAT) 0.4 MG SL tablet Place 1 tablet (0.4 mg total) under the tongue every 5 (five) minutes as needed. For chest pain 25 tablet 5  . Omega-3 Fatty Acids (FISH OIL PO) Take 1,000 Units by mouth daily. 1 capsule with a meal    . potassium chloride (K-DUR,KLOR-CON) 10 MEQ tablet Take 1 tablet by mouth every time you take furosemide (Lasix)    . pramipexole (MIRAPEX) 1 MG tablet Take 2 mg by mouth at bedtime.      . simvastatin (ZOCOR) 10 MG tablet Take 10 mg by mouth daily.      . vitamin C (ASCORBIC ACID) 500 MG tablet Take 500 mg by mouth daily.     No current facility-administered medications for this visit.      Past Medical History:  Diagnosis Date  . Ascending aorta dilatation (HCC)    Fusiform dilatation ascending aorta 44 x 45 mm - 06/2007  . Complete heart block (HCC)    Treated with pacemaker in 02/2011 - Dr. [name]  . Coronary artery disease   . Coronary atherosclerosis of unspecified type of vessel, native or graft   . Gilbert's syndrome   . Heart murmur 04/25/09   Mild aortic calcification on echo 07/03/2009  .  Hyperglycemia   . Hypertension   . Insomnia, unspecified   . Mixed hyperlipidemia   . Other postoperative functional disorders   . Pacemaker-[employer] 07/16/2011  . Peripheral neuropathy ([place])   . Pure hypercholesterolemia   . Restless leg syndrome   . Spermatocele    near left testicle 2 cm  . Spinal stenosis, lumbar   . Thoracic aneurysm without mention of rupture    Measured at 45 mm in the past. Consider repeat MRA in 2015.  [name] Thrombocytopenia ([place])    Averaging 140,000 since 2008  . Tubular adenoma 09/2012   Dr. Watt Climes repeat 4 years  . Type II or unspecified type diabetes mellitus without mention of complication, not stated as uncontrolled   . Unspecified sleep apnea     ROS:   All systems reviewed and negative except as noted in the HPI.   Past Surgical History:    Procedure Laterality Date  . APPENDECTOMY  1957  . CHOLECYSTECTOMY  1988  . CORONARY STENT PLACEMENT    . HEMORROIDECTOMY  1980  . HERNIA REPAIR Bilateral   . pacermaker  07/16/11   Dr. [name]  . TEE WITHOUT CARDIOVERSION N/A 06/14/2014   Procedure: TRANSESOPHAGEAL ECHOCARDIOGRAM (TEE);  Surgeon: [name], MD;  Location: [facility] ENDOSCOPY;  Service: Cardiovascular;  Laterality: N/A;     Family History  Problem Relation Age of Onset  . Cancer Mother     Small cell lung cancer  . Diabetes Father     DM  . CAD Father   . Hypertension Father   . Heart disease Father   . Heart attack Father   . Hypertension Sister      Social History   Social History  . Marital status: Married    Spouse name: N/A  . Number of children: N/A  . Years of education: N/A   Occupational History  . Not on file.   Social History Main Topics  . Smoking status: Former Research scientist (life sciences)  . Smokeless tobacco: Never Used  . Alcohol use Yes     Comment: occasional glass of wine  . Drug use: No  . Sexual activity: Not on file   Other Topics Concern  . Not on file   Social History Narrative  . No narrative on file     BP 126/74   Pulse 60   Ht 6\' 1"  (1.854 m)   Wt 181 lb 12.8 oz (82.5 kg)   BMI 23.99 kg/m   Physical Exam:  Well appearing 76 yo man, NAD HEENT: Unremarkable Neck:  6 cm JVD, no thyromegally Back:  No CVA tenderness Lungs:  Clear with no wheezes, rales, or rhonchi, well healed PPM incision. HEART:  Regular rate rhythm, no murmurs, no rubs, no clicks Abd:  soft, positive bowel sounds, no organomegally, no rebound, no guarding Ext:  2 plus pulses, no edema, no cyanosis, no clubbing Skin:  No rashes no nodules Neuro:  CN II through XII intact, motor grossly intact   DEVICE  Normal device function.  See PaceArt for details.   A/P 1. Complete heart block - he is asymptomatic s/p PPM insertion 2. PPM - his [employer] DDD PM is working normally. Will recheck in several months 3. HTN  - his blood pressure is slightly elevated but has been controlled. 4. Chronic diastolic heart failure - his symptoms are class 2. He has been encouraged to maintain a low sodium diet. We discussed the importance of diuretic therapy for fluid overload. He was  instructed to increase his diuretic to 3 times a week.   David [name].D.

## 2016-07-12 NOTE — Patient Instructions (Addendum)
Medication Instructions:  Your physician recommends that you continue on your current medications as directed. Please refer to the Current Medication list given to you today.   Labwork: None Ordered   Testing/Procedures: None Ordered   Follow-Up: Your physician wants you to follow-up in: 1 year with Dr. Taylor.  You will receive a reminder letter in the mail two months in advance. If you don't receive a letter, please call our office to schedule the follow-up appointment.  Remote monitoring is used to monitor your Pacemaker from home. This monitoring reduces the number of office visits required to check your device to one time per year. It allows us to keep an eye on the functioning of your device to ensure it is working properly. You are scheduled for a device check from home on 10/14/16. You may send your transmission at any time that day. If you have a wireless device, the transmission will be sent automatically. After your physician reviews your transmission, you will receive a postcard with your next transmission date. ,   Any Other Special Instructions Will Be Listed Below (If Applicable).     If you need a refill on your cardiac medications before your next appointment, please call your pharmacy.   

## 2016-08-02 ENCOUNTER — Other Ambulatory Visit: Payer: Self-pay | Admitting: Interventional Cardiology

## 2016-08-02 NOTE — Telephone Encounter (Signed)
Request received for Eliquis 5mg ; Age 76 years, Crea-1.30 on 01/31/16, Wt-82.5kg, last seen by Dr. Lovena Le on 07/12/16; will refill as requested to Pharmacy.

## 2016-10-04 ENCOUNTER — Ambulatory Visit: Payer: Medicare HMO | Admitting: Interventional Cardiology

## 2016-10-08 ENCOUNTER — Ambulatory Visit (INDEPENDENT_AMBULATORY_CARE_PROVIDER_SITE_OTHER): Payer: Medicare HMO | Admitting: Interventional Cardiology

## 2016-10-08 ENCOUNTER — Encounter: Payer: Self-pay | Admitting: Interventional Cardiology

## 2016-10-08 VITALS — BP 115/70 | HR 62 | Ht 73.0 in | Wt 175.0 lb

## 2016-10-08 DIAGNOSIS — I25119 Atherosclerotic heart disease of native coronary artery with unspecified angina pectoris: Secondary | ICD-10-CM

## 2016-10-08 DIAGNOSIS — R600 Localized edema: Secondary | ICD-10-CM | POA: Diagnosis not present

## 2016-10-08 DIAGNOSIS — I5032 Chronic diastolic (congestive) heart failure: Secondary | ICD-10-CM | POA: Diagnosis not present

## 2016-10-08 DIAGNOSIS — I4891 Unspecified atrial fibrillation: Secondary | ICD-10-CM | POA: Diagnosis not present

## 2016-10-08 DIAGNOSIS — Z950 Presence of cardiac pacemaker: Secondary | ICD-10-CM | POA: Diagnosis not present

## 2016-10-08 DIAGNOSIS — I359 Nonrheumatic aortic valve disorder, unspecified: Secondary | ICD-10-CM | POA: Diagnosis not present

## 2016-10-08 NOTE — Progress Notes (Signed)
Patient ID: David [name], male   DOB: Dec 29, 1940, 76 y.o.   MRN: [id number]     Cardiology Office Note   Date:  10/08/2016   ID:  David [name], DOB November 01, 1940, MRN [id number]  PCP:  Lajean Manes, MD    No chief complaint on file. f/u CAD, diastolic heart failure   Wt Readings from Last 3 Encounters:  10/08/16 175 lb (79.4 kg)  07/12/16 181 lb 12.8 oz (82.5 kg)  04/04/16 180 lb 1.9 oz (81.7 kg)       History of Present Illness: David [name] is a 76 y.o. male  who had a pacemaker placed in 2012 for complete heart block. He does have a history of coronary artery disease and atrial fibrillation. He had an LAD atherectomy in 1999. He was started on Eliquis after A. fib was found on his pacemaker check in the past.  Per his report, he is having more AFib by the most recent pacer check than prior.  Up to 7 % of the time.    He has had diastolic heart failure. In 4/17, he has had leg swelling.  Managed with diuretics and decreasing salt intake.     He continues to manage salt intake. He does not elevate his legs. He does not wear compression stockings, but is looking into stocking that are easier to put on. He has been taking his Lasix a few times a week, not quite every other day. He takes his potassium when he takes his Lasix.   In 01/2016, he had some additional fatigue.  He took some extra Lasix.  His energy level improved.  He had an echo showing no signficant change from prior. Normal LV function with mild AS.  Overall, he has been doing well.  No bleeding issues.   He still rides his motorcycle.  No bleeding issues on his Eliquis.   Past Medical History:  Diagnosis Date  . Ascending aorta dilatation (HCC)    Fusiform dilatation ascending aorta 44 x 45 mm - 06/2007  . Complete heart block (HCC)    Treated with pacemaker in 02/2011 - Dr. [name]  . Coronary artery disease   . Coronary atherosclerosis of unspecified type of vessel, native or graft   . Gilbert's syndrome   .  Heart murmur 04/25/09   Mild aortic calcification on echo 07/03/2009  . Hyperglycemia   . Hypertension   . Insomnia, unspecified   . Mixed hyperlipidemia   . Other postoperative functional disorders   . Pacemaker-[employer]es 07/16/2011  . Peripheral neuropathy   . Pure hypercholesterolemia   . Restless leg syndrome   . Spermatocele    near left testicle 2 cm  . Spinal stenosis, lumbar   . Thoracic aneurysm without mention of rupture    Measured at 45 mm in the past. Consider repeat MRA in 2015.  [name] Thrombocytopenia ([place])    Averaging 140,000 since 2008  . Tubular adenoma 09/2012   Dr. Watt Climes repeat 4 years  . Type II or unspecified type diabetes mellitus without mention of complication, not stated as uncontrolled   . Unspecified sleep apnea     Past Surgical History:  Procedure Laterality Date  . APPENDECTOMY  1957  . CHOLECYSTECTOMY  1988  . CORONARY STENT PLACEMENT    . HEMORROIDECTOMY  1980  . HERNIA REPAIR Bilateral   . pacermaker  07/16/11   Dr. [name]  . TEE WITHOUT CARDIOVERSION N/A 06/14/2014   Procedure: TRANSESOPHAGEAL ECHOCARDIOGRAM (TEE);  Surgeon: [name], MD;  Location: [facility] ENDOSCOPY;  Service: Cardiovascular;  Laterality: N/A;     Current Outpatient Prescriptions  Medication Sig Dispense Refill  . acetaminophen (TYLENOL) 500 MG tablet Take 500 mg by mouth every 6 (six) hours as needed for mild pain.    [name] apixaban (ELIQUIS) 5 MG TABS tablet Take 1 tablet (5 mg total) by mouth 2 (two) times daily. 60 tablet 1  . apixaban (ELIQUIS) 5 MG TABS tablet Take 5 mg by mouth 2 (two) times daily.    . cholecalciferol (VITAMIN D) 1000 UNITS tablet Take 1,000 Units by mouth daily.    . Coenzyme Q10 (COQ10) 100 MG CAPS Take 100 mg by mouth daily.     . Cyanocobalamin (VITAMIN B-12) 2500 MCG SUBL Place 1 tablet under the tongue daily.    . famotidine (PEPCID) 10 MG tablet Take 10 mg by mouth daily.     . fluticasone (FLONASE) 50 MCG/ACT nasal spray Place 1 spray into both  nostrils daily as needed for allergies or rhinitis.     . furosemide (LASIX) 40 MG tablet Take 40 mg by mouth every other day.    . loratadine (CLARITIN) 10 MG tablet Take 10 mg by mouth daily as needed for allergies.    . metoprolol (LOPRESSOR) 50 MG tablet Take 25 mg by mouth 2 (two) times daily.     . Multiple Vitamin (MULTIVITAMIN) capsule TAKE 1 CAPSULE BY MOUTH THREE TIMES A WEEK    . naproxen (NAPROSYN) 500 MG tablet Take 500 mg by mouth 2 (two) times daily as needed for mild pain.    . niacin (NIASPAN) 1000 MG CR tablet Take 1 tablet (1,000 mg total) by mouth daily. 30 tablet 6  . nitroGLYCERIN (NITROSTAT) 0.4 MG SL tablet Place 1 tablet (0.4 mg total) under the tongue every 5 (five) minutes as needed. For chest pain 25 tablet 5  . Omega-3 Fatty Acids (FISH OIL PO) Take 1,000 Units by mouth daily. 1 capsule with a meal    . potassium chloride (K-DUR,KLOR-CON) 10 MEQ tablet Take 1 tablet by mouth every time you take furosemide (Lasix)    . pramipexole (MIRAPEX) 1 MG tablet Take 2 mg by mouth at bedtime.      . simvastatin (ZOCOR) 10 MG tablet Take 10 mg by mouth daily.      . vitamin C (ASCORBIC ACID) 500 MG tablet Take 500 mg by mouth daily.     No current facility-administered medications for this visit.     Allergies:   Benadryl [diphenhydramine hcl]; Antihistamines, chlorpheniramine-type; and Restoril [temazepam]    Social History:  The patient  reports that he has quit smoking. He has never used smokeless tobacco. He reports that he drinks alcohol. He reports that he does not use drugs.   Family History:  The patient's family history includes CAD in his father; Cancer in his mother; Diabetes in his father; Heart attack in his father; Heart disease in his father; Hypertension in his father and sister.    ROS:  Please see the history of present illness.   Otherwise, review of systems are positive for leg swelling.   All other systems are reviewed and negative.    PHYSICAL  EXAM: VS:  BP 115/70   Pulse 62   Ht 6\' 1"  (1.854 m)   Wt 175 lb (79.4 kg)   BMI 23.09 kg/m  , BMI Body mass index is 23.09 kg/m. GEN: Well nourished, well developed, in no acute distress  HEENT: normal  Neck:  no JVD, carotid bruits, or masses Cardiac: RRR for the most part, periods of irregular heartbeat; no murmurs, rubs, or gallops, 2+ bilateral pitting edema - worse at the ankles- cyst noted in the lop of the left foot Respiratory:  clear to auscultation bilaterally, normal work of breathing GI: soft, nontender, nondistended, + BS MS: no deformity or atrophy  Skin: warm and dry, no rash Neuro:  Strength and sensation are intact Psych: euthymic mood, full affect  ECG: AV paced  Recent Labs: 01/31/2016: Brain Natriuretic Peptide 252.6; BUN 26; Creat 1.30; Hemoglobin 14.5; Platelets 153; Potassium 4.3; Sodium 140   Lipid Panel    Component Value Date/Time   CHOL 119 (L) 05/04/2015 0818   TRIG 62 05/04/2015 0818   HDL 58 05/04/2015 0818   CHOLHDL 2.1 05/04/2015 0818   VLDL 12 05/04/2015 0818   LDLCALC 49 05/04/2015 0818     Other studies Reviewed: Additional studies/ records that were reviewed today with results demonstrating: Normal ejection fraction and valvular function in Sept 2017.   ASSESSMENT AND PLAN:  1. Chronic diastolic heart failure/lower extremity edema.: I recommended that he continue with the Lasix when I saw him a few months ago.  He should elevate his legs. He should wear compression stockings, and is looking into "Juxtalite stockings."  Try taking the Lasix QOD, but does not get quite that much.  Mild DOE with walking up three flights of stairs. 2. CAD: Anginal symptoms controlled on medical therapy recently.  Had fatigue in September 2017 which is better.   He feels back to normal. He does not want to do stress test at this time. If symptoms change, he will let us know. 3. History of pacemaker: Detecting percentage of AFib on checks. 4. Atrial  fibrillation: Anticoagulation with Eliquis for stroke prevention.  No bleeding problems. 5. Calcified aortic valve. No significant stenosis by last echo.  Normal LV function. 6. Leg edema: Elevate legs. I encouraged him to use his compression stockings- will see how the Juxtalite stockings do. He should use the Lasix QOD. He'll let us know if the fatigue returns.   Current medicines are reviewed at length with the patient today.  The patient concerns regarding his medicines were addressed.  The following changes have been made:  Continue Lasix for a few days  Labs/ tests ordered today include:   No orders of the defined types were placed in this encounter.   Recommend 150 minutes/week of aerobic exercise Low fat, low carb, high fiber diet recommended  Disposition:   FU in 6 months or sooner if swelling persists   Signed, Larae Grooms, MD  10/08/2016 9:15 AM    [facility] [place], [place], [place]  [address] Phone: [phone]; Fax: [phone]

## 2016-10-08 NOTE — Patient Instructions (Signed)

## 2016-10-14 ENCOUNTER — Ambulatory Visit (INDEPENDENT_AMBULATORY_CARE_PROVIDER_SITE_OTHER): Payer: Medicare HMO | Admitting: *Deleted

## 2016-10-14 DIAGNOSIS — I442 Atrioventricular block, complete: Secondary | ICD-10-CM

## 2016-10-14 NOTE — Progress Notes (Signed)
Remote pacemaker transmission.   

## 2016-10-15 LAB — CUP PACEART REMOTE DEVICE CHECK
Battery Remaining Longevity: 64 mo
Battery Voltage: 2.89 V
Brady Statistic AP VS Percent: 1 %
Brady Statistic AS VP Percent: 1.9 %
Brady Statistic AS VS Percent: 1 %
Brady Statistic RA Percent Paced: 97 %
Implantable Lead Implant Date: 20121031
Implantable Lead Location: 753859
Implantable Lead Location: 753860
Implantable Pulse Generator Implant Date: 20121031
Lead Channel Impedance Value: 380 Ohm
Lead Channel Pacing Threshold Amplitude: 0.5 V
Lead Channel Pacing Threshold Pulse Width: 0.5 ms
Lead Channel Pacing Threshold Pulse Width: 0.5 ms
Lead Channel Setting Pacing Amplitude: 1.25 V
MDC IDC LEAD IMPLANT DT: 20121031
MDC IDC MSMT BATTERY REMAINING PERCENTAGE: 57 %
MDC IDC MSMT LEADCHNL RA SENSING INTR AMPL: 1.5 mV
MDC IDC MSMT LEADCHNL RV IMPEDANCE VALUE: 440 Ohm
MDC IDC MSMT LEADCHNL RV PACING THRESHOLD AMPLITUDE: 1 V
MDC IDC MSMT LEADCHNL RV SENSING INTR AMPL: 12 mV
MDC IDC SESS DTM: 20180521062439
MDC IDC SET LEADCHNL RA PACING AMPLITUDE: 1.5 V
MDC IDC SET LEADCHNL RV PACING PULSEWIDTH: 0.5 ms
MDC IDC SET LEADCHNL RV SENSING SENSITIVITY: 4 mV
MDC IDC STAT BRADY AP VP PERCENT: 98 %
MDC IDC STAT BRADY RV PERCENT PACED: 99 %
Pulse Gen Serial Number: 7280216

## 2016-10-16 ENCOUNTER — Encounter: Payer: Self-pay | Admitting: Cardiology

## 2016-10-24 ENCOUNTER — Telehealth: Payer: Self-pay

## 2016-10-24 NOTE — Telephone Encounter (Signed)
Sent clearance documents to medical records to be faxed to [facility] [phone]. Dr Lovena Le stated "Stop Eliquis 2 days before procedure. Restart when you deem appropriate".

## 2016-10-25 DIAGNOSIS — H353131 Nonexudative age-related macular degeneration, bilateral, early dry stage: Secondary | ICD-10-CM | POA: Diagnosis not present

## 2016-10-25 DIAGNOSIS — H524 Presbyopia: Secondary | ICD-10-CM | POA: Diagnosis not present

## 2016-10-25 DIAGNOSIS — H43813 Vitreous degeneration, bilateral: Secondary | ICD-10-CM | POA: Diagnosis not present

## 2016-10-25 DIAGNOSIS — H26492 Other secondary cataract, left eye: Secondary | ICD-10-CM | POA: Diagnosis not present

## 2016-11-01 DIAGNOSIS — Z86010 Personal history of colonic polyps: Secondary | ICD-10-CM | POA: Diagnosis not present

## 2016-11-01 DIAGNOSIS — K5730 Diverticulosis of large intestine without perforation or abscess without bleeding: Secondary | ICD-10-CM | POA: Diagnosis not present

## 2016-11-01 DIAGNOSIS — D126 Benign neoplasm of colon, unspecified: Secondary | ICD-10-CM | POA: Diagnosis not present

## 2016-11-05 DIAGNOSIS — D126 Benign neoplasm of colon, unspecified: Secondary | ICD-10-CM | POA: Diagnosis not present

## 2016-12-24 DIAGNOSIS — Z79899 Other long term (current) drug therapy: Secondary | ICD-10-CM | POA: Diagnosis not present

## 2016-12-24 DIAGNOSIS — E119 Type 2 diabetes mellitus without complications: Secondary | ICD-10-CM | POA: Diagnosis not present

## 2016-12-24 DIAGNOSIS — Z0000 Encounter for general adult medical examination without abnormal findings: Secondary | ICD-10-CM | POA: Diagnosis not present

## 2016-12-24 DIAGNOSIS — Z125 Encounter for screening for malignant neoplasm of prostate: Secondary | ICD-10-CM | POA: Diagnosis not present

## 2016-12-24 DIAGNOSIS — E7800 Pure hypercholesterolemia, unspecified: Secondary | ICD-10-CM | POA: Diagnosis not present

## 2016-12-24 DIAGNOSIS — D696 Thrombocytopenia, unspecified: Secondary | ICD-10-CM | POA: Diagnosis not present

## 2016-12-24 DIAGNOSIS — I10 Essential (primary) hypertension: Secondary | ICD-10-CM | POA: Diagnosis not present

## 2016-12-24 DIAGNOSIS — G4733 Obstructive sleep apnea (adult) (pediatric): Secondary | ICD-10-CM | POA: Diagnosis not present

## 2016-12-24 DIAGNOSIS — I480 Paroxysmal atrial fibrillation: Secondary | ICD-10-CM | POA: Diagnosis not present

## 2017-01-13 ENCOUNTER — Ambulatory Visit (INDEPENDENT_AMBULATORY_CARE_PROVIDER_SITE_OTHER): Payer: Medicare HMO | Admitting: *Deleted

## 2017-01-13 DIAGNOSIS — I442 Atrioventricular block, complete: Secondary | ICD-10-CM

## 2017-01-15 NOTE — Progress Notes (Signed)
Remote pacemaker transmission.   

## 2017-01-22 LAB — CUP PACEART REMOTE DEVICE CHECK
Battery Remaining Longevity: 54 mo
Battery Remaining Percentage: 51 %
Brady Statistic AP VP Percent: 98 %
Brady Statistic AP VS Percent: 1 %
Brady Statistic AS VP Percent: 2.4 %
Brady Statistic RA Percent Paced: 97 %
Brady Statistic RV Percent Paced: 99 %
Implantable Lead Location: 753859
Lead Channel Impedance Value: 330 Ohm
Lead Channel Pacing Threshold Pulse Width: 0.5 ms
Lead Channel Sensing Intrinsic Amplitude: 12 mV
Lead Channel Sensing Intrinsic Amplitude: 2.1 mV
Lead Channel Setting Pacing Amplitude: 1.5 V
Lead Channel Setting Sensing Sensitivity: 4 mV
MDC IDC LEAD IMPLANT DT: 20121031
MDC IDC LEAD IMPLANT DT: 20121031
MDC IDC LEAD LOCATION: 753860
MDC IDC MSMT BATTERY VOLTAGE: 2.87 V
MDC IDC MSMT LEADCHNL RA PACING THRESHOLD AMPLITUDE: 0.5 V
MDC IDC MSMT LEADCHNL RV IMPEDANCE VALUE: 390 Ohm
MDC IDC MSMT LEADCHNL RV PACING THRESHOLD AMPLITUDE: 1.25 V
MDC IDC MSMT LEADCHNL RV PACING THRESHOLD PULSEWIDTH: 0.5 ms
MDC IDC PG IMPLANT DT: 20121031
MDC IDC SESS DTM: 20180820065544
MDC IDC SET LEADCHNL RV PACING AMPLITUDE: 1.5 V
MDC IDC SET LEADCHNL RV PACING PULSEWIDTH: 0.5 ms
MDC IDC STAT BRADY AS VS PERCENT: 1 %
Pulse Gen Serial Number: 7280216

## 2017-01-24 ENCOUNTER — Encounter: Payer: Self-pay | Admitting: Cardiology

## 2017-01-29 ENCOUNTER — Ambulatory Visit (INDEPENDENT_AMBULATORY_CARE_PROVIDER_SITE_OTHER): Payer: Medicare HMO | Admitting: Neurology

## 2017-01-29 ENCOUNTER — Encounter: Payer: Self-pay | Admitting: Neurology

## 2017-01-29 VITALS — BP 115/66 | HR 59 | Ht 72.0 in | Wt 178.0 lb

## 2017-01-29 DIAGNOSIS — G4731 Primary central sleep apnea: Secondary | ICD-10-CM

## 2017-01-29 DIAGNOSIS — G2581 Restless legs syndrome: Secondary | ICD-10-CM | POA: Diagnosis not present

## 2017-01-29 NOTE — Progress Notes (Signed)
SLEEP MEDICINE CLINIC   Provider:  [name], [place] D  Primary Care Physician:  Lajean Manes, MD   Referring Provider: Lajean Manes, MD   Chief Complaint  Patient presents with  . New Patient (Initial Visit)    pt is here with wife in room 10, pt has been diagnosed with very mild sleep apnea back in 2007 with a sleep stdy read by Dr Brett Fairy. pt  wife says that he stops breathing and does snore.     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 male ,  born in [place] , seen here as in a referral/ revisit  from Dr. [name] for a new sleep evaluation . Wife has reported irregular breathing at night, gasping and mild snoring.  He underwent a 2007 sleep study at [facility], with an AHI of 1.0/hr. Bradycardia. No CPAP was necessary.  He has been treated for RLS since 1999, on sinemet , Klonopin, Requip and now Mirapex ,now at 2 mg . He later developed heart block and in 2012 a cardiac pacemaker was implanted. He now gets more restless during the night. He has no insomnia, falls asleep easily but has 1-2 bathroom breaks. He talks in his sleep. He has kicked a ball during one of his dreams and fell out of bed. He has neuropathy, too.   Sleep habits are as follows: his bedtime is usually between 11 pm and 1 AM, he likes to work on his computer late.  He falls asleep promptly, in his cool, dark and quiet environment. He has to urinate once at 4 AM, and goes back to sleep. He sleeps until 5 AM, and stays in bed a little longer , rising at 6.30 AM. He feels refreshed , he can concentrate, focus.  He does have vivid dreams sometimes, but does not always remember them. He often sleeps supine, and in that position produces most apnea and irregular breathing. His wife has confirmed that the supine sleep position seems to predispose him to apnea. He sleeps on 2 pillows. He is restless- he likes a hard, firm surface. He likes a weighted blanket.   Sleep medical history and family sleep history:    Parents had no sleep disorders. He developed with RLS in 1994, in [place], and Bradycardia in 1999 in [place], [place].   Social history: father of Dr. Kris Hartmann, engineering degree PhD from [employer]. No shift work history. No tobacco use, caffeine use : 1 in AM, 1 in PM. Alcohol- one a week.    Review of Systems: Out of a complete 14 system review, the patient complains of only the following symptoms, and all other reviewed systems are negative.  Dozing off during the day, easy bruising and bleeding.  Epworth score 14 , Fatigue severity score 44  , depression score    Social History   Social History  . Marital status: Married    Spouse name: N/A  . Number of children: N/A  . Years of education: N/A   Occupational History  . Not on file.   Social History Main Topics  . Smoking status: Former Research scientist (life sciences)  . Smokeless tobacco: Never Used  . Alcohol use Yes     Comment: occasional glass of wine  . Drug use: No  . Sexual activity: Not on file   Other Topics Concern  . Not on file   Social History Narrative  . No narrative on file    Family History  Problem Relation Age of Onset  .  Cancer Mother        Small cell lung cancer  . Diabetes Father        DM  . CAD Father   . Hypertension Father   . Heart disease Father   . Heart attack Father   . Hypertension Sister     Past Medical History:  Diagnosis Date  . Ascending aorta dilatation (HCC)    Fusiform dilatation ascending aorta 44 x 45 mm - 06/2007  . Complete heart block (HCC)    Treated with pacemaker in 02/2011 - Dr. [name]  . Coronary artery disease   . Coronary atherosclerosis of unspecified type of vessel, native or graft   . Gilbert's syndrome   . Heart murmur 04/25/09   Mild aortic calcification on echo 07/03/2009  . Hyperglycemia   . Hypertension   . Insomnia, unspecified   . Mixed hyperlipidemia   . Other postoperative functional disorders   . Pacemaker-[facility]es 07/16/2011  . Peripheral neuropathy   . Pure  hypercholesterolemia   . Restless leg syndrome   . Spermatocele    near left testicle 2 cm  . Spinal stenosis, lumbar   . Thoracic aneurysm without mention of rupture    Measured at 45 mm in the past. Consider repeat MRA in 2015.  [name] Thrombocytopenia ([place])    Averaging 140,000 since 2008  . Tubular adenoma 09/2012   Dr. Watt Climes repeat 4 years  . Type II or unspecified type diabetes mellitus without mention of complication, not stated as uncontrolled   . Unspecified sleep apnea     Past Surgical History:  Procedure Laterality Date  . APPENDECTOMY  1957  . CHOLECYSTECTOMY  1988  . CORONARY STENT PLACEMENT    . HEMORROIDECTOMY  1980  . HERNIA REPAIR Bilateral   . pacermaker  07/16/11   Dr. [name]  . TEE WITHOUT CARDIOVERSION N/A 06/14/2014   Procedure: TRANSESOPHAGEAL ECHOCARDIOGRAM (TEE);  Surgeon: [name], MD;  Location: [facility] ENDOSCOPY;  Service: Cardiovascular;  Laterality: N/A;    Current Outpatient Prescriptions  Medication Sig Dispense Refill  . acetaminophen (TYLENOL) 500 MG tablet Take 500 mg by mouth every 6 (six) hours as needed for mild pain.    [name] apixaban (ELIQUIS) 5 MG TABS tablet Take 1 tablet (5 mg total) by mouth 2 (two) times daily. 60 tablet 1  . cholecalciferol (VITAMIN D) 1000 UNITS tablet Take 1,000 Units by mouth daily.    . Coenzyme Q10 (COQ10) 100 MG CAPS Take 100 mg by mouth daily.     . Cyanocobalamin (VITAMIN B-12) 2500 MCG SUBL Place 1 tablet under the tongue daily.    . famotidine (PEPCID) 10 MG tablet Take 10 mg by mouth daily.     . fluticasone (FLONASE) 50 MCG/ACT nasal spray Place 1 spray into both nostrils daily as needed for allergies or rhinitis.     . furosemide (LASIX) 40 MG tablet Take 40 mg by mouth every other day.    . loratadine (CLARITIN) 10 MG tablet Take 10 mg by mouth daily as needed for allergies.    . metoprolol (LOPRESSOR) 50 MG tablet Take 25 mg by mouth 2 (two) times daily.     . Multiple Vitamin (MULTIVITAMIN) capsule TAKE 1 CAPSULE  BY MOUTH THREE TIMES A WEEK    . naproxen (NAPROSYN) 500 MG tablet Take 500 mg by mouth 2 (two) times daily as needed for mild pain.    . niacin (NIASPAN) 1000 MG CR tablet Take 1 tablet (1,000 mg total)  by mouth daily. 30 tablet 6  . nitroGLYCERIN (NITROSTAT) 0.4 MG SL tablet Place 1 tablet (0.4 mg total) under the tongue every 5 (five) minutes as needed. For chest pain 25 tablet 5  . Omega-3 Fatty Acids (FISH OIL PO) Take 1,000 Units by mouth daily. 1 capsule with a meal    . potassium chloride (K-DUR,KLOR-CON) 10 MEQ tablet Take 1 tablet by mouth every time you take furosemide (Lasix)    . pramipexole (MIRAPEX) 1 MG tablet Take 2 mg by mouth at bedtime.      . simvastatin (ZOCOR) 10 MG tablet Take 10 mg by mouth daily.      . vitamin C (ASCORBIC ACID) 500 MG tablet Take 500 mg by mouth daily.     No current facility-administered medications for this visit.     Allergies as of 01/29/2017 - Review Complete 01/29/2017  Allergen Reaction Noted  . Benadryl [diphenhydramine hcl] Other (See Comments) 04/18/2011  . Antihistamines, chlorpheniramine-type Palpitations 08/15/2014  . Restoril [temazepam] Other (See Comments) and Palpitations 08/09/2013    Vitals: BP 115/66   Pulse (!) 59   Ht 6' (1.829 m)   Wt 178 lb (80.7 kg)   BMI 24.14 kg/m  Last Weight:  Wt Readings from Last 1 Encounters:  01/29/17 178 lb (80.7 kg)   FUX:NATF mass index is 24.14 kg/m.     Last Height:   Ht Readings from Last 1 Encounters:  01/29/17 6' (1.829 m)    Physical exam:  General: The patient is awake, alert and appears not in acute distress. The patient is well groomed. Head: Normocephalic, atraumatic. Neck is supple. Mallampati 2,  neck circumference: 15.25. Nasal airflow patent ,  Cardiovascular:  irregular rate and rhythm,  Skipped beats -without carotid bruit, and without distended neck veins. Respiratory: Lungs are clear to auscultation. Skin:  Without evidence of edema, or rash Trunk: BMI is 24.  The patient's posture is erect   Neurologic exam : The patient is awake and alert, oriented to place and time.   Memory subjective described as intact.  Speech is fluent,  without  dysarthria, dysphonia or aphasia.  Mood and affect are appropriate.  Cranial nerves: Pupils are equal and briskly reactive to light. Funduscopic exam deferred. Extraocular movements  in vertical and horizontal planes intact and without nystagmus.  Visual fields by finger perimetry are intact. Hearing to finger rub intact. Facial sensation intact to fine touch.  Facial motor strength is symmetric and tongue and uvula move midline. Shoulder shrug was symmetrical.   Motor exam:  Normal tone, muscle bulk and symmetric strength in all extremities.  Sensory:  Fine touch, pinprick and vibration -Proprioception tested in the upper extremities was normal.  Coordination:Finger-to-nose maneuver  normal without evidence of ataxia, dysmetria or tremor.  Gait and station: Patient walks without assistive device . Turns with 3  Steps. Romberg testing is negative.  Deep tendon reflexes: in the  upper and lower extremities are symmetric and intact.    Assessment:  After physical and neurologic examination, review of laboratory studies,  Personal review of imaging studies, reports of other /same  Imaging studies, results of polysomnography and / or neurophysiology testing and pre-existing records as far as provided in visit., my assessment is   1) atrial fibrillation, pacemaker patient - on eloquis-  presenting with EDS and reduced nocturnal sleep time. Advancing his bed time and eliminating screen light in the hour prior.   2) it sounds as if central apneas were witnessed. Will  confirmed in attended sleep study.   3) RLS- neuropathy. Consider adding Neurontin, and keeping him on mirapex for now.    The patient was advised of the nature of the diagnosed disorder , the treatment options and the  risks for general health and  wellness arising from not treating the condition.   I spent more than minutes of face to face time with the patient.  Greater than 50% of time was spent in counseling and coordination of care. We have discussed the diagnosis and differential and I answered the patient's questions.    Plan:  Treatment plan and additional workup :  Attended split night study with REM behavior watch.   [name], MD 02/01/3381, 5:05 PM  Certified in Neurology by ABPN Certified in [place] by [facility] [address] [place], [place] [address]

## 2017-01-29 NOTE — Patient Instructions (Signed)

## 2017-02-06 ENCOUNTER — Other Ambulatory Visit: Payer: Self-pay | Admitting: Internal Medicine

## 2017-03-25 DIAGNOSIS — L570 Actinic keratosis: Secondary | ICD-10-CM | POA: Diagnosis not present

## 2017-03-25 DIAGNOSIS — L739 Follicular disorder, unspecified: Secondary | ICD-10-CM | POA: Diagnosis not present

## 2017-03-31 ENCOUNTER — Ambulatory Visit (INDEPENDENT_AMBULATORY_CARE_PROVIDER_SITE_OTHER): Payer: Medicare HMO | Admitting: Neurology

## 2017-03-31 DIAGNOSIS — G2581 Restless legs syndrome: Secondary | ICD-10-CM

## 2017-03-31 DIAGNOSIS — G4731 Primary central sleep apnea: Secondary | ICD-10-CM

## 2017-04-03 ENCOUNTER — Other Ambulatory Visit: Payer: Self-pay | Admitting: Neurology

## 2017-04-03 DIAGNOSIS — Z950 Presence of cardiac pacemaker: Secondary | ICD-10-CM

## 2017-04-03 DIAGNOSIS — I442 Atrioventricular block, complete: Secondary | ICD-10-CM

## 2017-04-03 DIAGNOSIS — I712 Thoracic aortic aneurysm, without rupture, unspecified: Secondary | ICD-10-CM

## 2017-04-03 DIAGNOSIS — I5030 Unspecified diastolic (congestive) heart failure: Secondary | ICD-10-CM

## 2017-04-03 DIAGNOSIS — G4731 Primary central sleep apnea: Secondary | ICD-10-CM

## 2017-04-03 NOTE — Procedures (Signed)
PATIENT'S NAME:  David [name], David [name] DOB:      [birth year]      MR#:    [id number]     DATE OF RECORDING: 03/31/2017 REFERRING M.D.:  Lajean Manes, MD. Study Performed:   Baseline Polysomnogram HISTORY:  75 y.o. male patient, seen here as in a referral from [facility] for a new sleep evaluation. Wife has reported irregular breathing at night, gasping and mild snoring. The patient is excessive daytime sleepy. He underwent in 2007 a sleep study at [facility], which resulted in a low AHI, no need for CPAP, but had Bradycardia. He has been treated for RLS since 1999, on Sinemet, Klonopin, Requip and now Mirapex, now at 2 mg. He later developed heart block and in 2012 a cardiac pacemaker was implanted.  He is more restless during the night. He has no insomnia, falls asleep easily but has 1-2 bathroom breaks. He talks in his sleep. He has dreamt he kicked a ball during and fell out of bed. He has neuropathy, too.   The patient endorsed the Epworth Sleepiness Scale at 14/24 points.   The patient's weight 178 pounds with a height of 72 (inches), resulting in a BMI of 24.2 kg/m2. The patient's neck circumference measured 15.25 inches.  CURRENT MEDICATIONS: Eliquis, Vitamin D, CoQ10, B12, Pepcid, Flonase, Lasix, Claritin, Lopressor, Naprosyn, Niaspan, Nitrostat, Omega 3, K-Dur, Mirapex, Zocor, Vitamin C   PROCEDURE:  This is a multichannel digital polysomnogram utilizing the Somnostar 11.2 system.  Electrodes and sensors were applied and monitored per AASM Specifications.   EEG, EOG, Chin and Limb EMG, were sampled at 200 Hz.  ECG, Snore and Nasal Pressure, Thermal Airflow, Respiratory Effort, CPAP Flow and Pressure, Oximetry was sampled at 50 Hz. Digital video and audio were recorded.      BASELINE STUDY : Lights Out was at 22:51 and Lights On at 05:01.  Total recording time (TRT) was 370 minutes, with a total sleep time (TST) of 322.5 minutes.   The patient's sleep latency was 14 minutes.  REM latency was 240  minutes.  The sleep efficiency was 87.2 %.     SLEEP ARCHITECTURE: WASO (Wake after sleep onset) was 13.5 minutes.  There were 11.5 minutes in Stage N1, 234.5 minutes Stage N2, 56 minutes Stage N3 and 20.5 minutes in Stage REM.  The percentage of Stage N1 was 3.6%, Stage N2 was 72.7%, Stage N3 was 17.4% and Stage R (REM sleep) was 6.4%.   RESPIRATORY ANALYSIS:  There were a total of 172 respiratory events:  111 obstructive apneas, 4 central apneas and 0 mixed apneas with 57 hypopneas. The patient also had 0 respiratory event related arousals (RERAs).The total APNEA/HYPOPNEA INDEX (AHI) was 32.0/hour and the total RESPIRATORY DISTURBANCE INDEX was 32.0 /hour.  4 events occurred in REM sleep and 116 events in NREM. The REM AHI was 11.7 /hour, versus a non-REM AHI of 33.4. The patient spent 101 minutes of total sleep time in the supine position and 222 minutes in non-supine. The supine AHI was 44.5 versus a non-supine AHI of 26.2.  OXYGEN SATURATION & C02:  The Wake baseline 02 saturation was 97%, with the lowest being 80%. Time spent below 89% saturation equaled 28 minutes.    PERIODIC LIMB MOVEMENTS:   The patient had a total of 0 Periodic Limb Movements. The arousals were noted as: 13 were spontaneous, 0 were associated with PLMs, and 173 were associated with respiratory events.  Audio and video analysis did document late night sleep talking, laughing  but no sleep walking behavior. The arousals were noted as: 13 were spontaneous, 0 were associated with PLMs, and 173 were associated with respiratory events. The patient took bathroom breaks. Post-study, the patient indicated that sleep was the same as usual.  Paced EKG.   IMPRESSION:  1. Severe Obstructive Sleep Apnea (OSA), but not REM accentuated. 2. REM behavior was not seen- the vocalization occurred in NREM sleep , highly unusual.  RECOMMENDATIONS:  1. Advise full-night, attended, CPAP titration study to optimize therapy.  A follow up  appointment will be scheduled in the Sleep Clinic at [facility]. The referring provider will be notified of the results.      I certify that I have reviewed the entire raw data recording prior to the issuance of this report in accordance with the Standards of Accreditation of the American Academy of Sleep Medicine (AASM)      [name], MD     04-02-2017  Diplomat, American Board of Psychiatry and Neurology  Diplomat, American Board of [place] Director, [facility] [employer]

## 2017-04-07 NOTE — Progress Notes (Signed)
Cardiology Office Note   Date:  04/08/2017   ID:  Andersen Iorio, DOB 1940/12/03, MRN [id number]  PCP:  Lajean Manes, MD    No chief complaint on file. CAD   Wt Readings from Last 3 Encounters:  04/08/17 180 lb 6.4 oz (81.8 kg)  01/29/17 178 lb (80.7 kg)  10/08/16 175 lb (79.4 kg)       History of Present Illness: David [name] is a 76 y.o. male  who had a pacemaker placed in 2012 for complete heart block. He does have a history of coronary artery disease and atrial fibrillation. He had an LAD atherectomy in 1999. He was started on Eliquis after A. fib was found on his pacemaker check in the past.  Per his report, he is having more AFib by the most recent pacer check than prior.  Up to 7 % of the time.    He has had diastolic heart failure. In 4/17, he has had leg swelling.  Managed with diuretics and decreasing salt intake.  In 01/2016, he had some additional fatigue.  He took some extra Lasix.  His energy level improved.  He had an echo showing no signficant change from prior. Normal LV function with mild AS.  He has had more fatigue of late.  He had an abnormal sleep study.  He exercises several times a week without any problems. He rides a bike without problems.    No bleeding problems.  Mild bruising issues.   He feels that leg swelling is worse, but he has not been taking his Lasix as prescribed.    Denies : Chest pain. Dizziness. Leg edema. Nitroglycerin use. Orthopnea. Palpitations. Paroxysmal nocturnal dyspnea. Shortness of breath. Syncope.    Past Medical History:  Diagnosis Date  . Ascending aorta dilatation (HCC)    Fusiform dilatation ascending aorta 44 x 45 mm - 06/2007  . Complete heart block (HCC)    Treated with pacemaker in 02/2011 - Dr. [name]  . Coronary artery disease   . Coronary atherosclerosis of unspecified type of vessel, native or graft   . Gilbert's syndrome   . Heart murmur 04/25/09   Mild aortic calcification on echo 07/03/2009  .  Hyperglycemia   . Hypertension   . Insomnia, unspecified   . Mixed hyperlipidemia   . Other postoperative functional disorders   . Pacemaker-[facility] 07/16/2011  . Peripheral neuropathy   . Pure hypercholesterolemia   . Restless leg syndrome   . Spermatocele    near left testicle 2 cm  . Spinal stenosis, lumbar   . Thoracic aneurysm without mention of rupture    Measured at 45 mm in the past. Consider repeat MRA in 2015.  [name] Thrombocytopenia ([place])    Averaging 140,000 since 2008  . Tubular adenoma 09/2012   Dr. Watt Climes repeat 4 years  . Type II or unspecified type diabetes mellitus without mention of complication, not stated as uncontrolled   . Unspecified sleep apnea     Past Surgical History:  Procedure Laterality Date  . APPENDECTOMY  1957  . CHOLECYSTECTOMY  1988  . CORONARY STENT PLACEMENT    . HEMORROIDECTOMY  1980  . HERNIA REPAIR Bilateral   . pacermaker  07/16/11   Dr. [name]     Current Outpatient Medications  Medication Sig Dispense Refill  . acetaminophen (TYLENOL) 500 MG tablet Take 500 mg by mouth every 6 (six) hours as needed for mild pain.    . cholecalciferol (VITAMIN D) 1000 UNITS tablet  Take 1,000 Units by mouth daily.    . Coenzyme Q10 (COQ10) 100 MG CAPS Take 100 mg by mouth daily.     . Cyanocobalamin (VITAMIN B-12) 2500 MCG SUBL Place 1 tablet under the tongue daily.    [name] ELIQUIS 5 MG TABS tablet TAKE 1 TABLET TWICE A DAY 180 tablet 1  . famotidine (PEPCID) 10 MG tablet Take 10 mg by mouth daily.     . fluticasone (FLONASE) 50 MCG/ACT nasal spray Place 1 spray into both nostrils daily as needed for allergies or rhinitis.     . furosemide (LASIX) 40 MG tablet Take 40 mg by mouth every other day.    . loratadine (CLARITIN) 10 MG tablet Take 10 mg by mouth daily as needed for allergies.    . metoprolol (LOPRESSOR) 50 MG tablet Take 25 mg by mouth 2 (two) times daily.     . Multiple Vitamin (MULTIVITAMIN) capsule TAKE 1 CAPSULE BY MOUTH THREE TIMES A WEEK      . niacin (NIASPAN) 1000 MG CR tablet Take 1 tablet (1,000 mg total) by mouth daily. 30 tablet 6  . nitroGLYCERIN (NITROSTAT) 0.4 MG SL tablet Place 1 tablet (0.4 mg total) under the tongue every 5 (five) minutes as needed. For chest pain 25 tablet 5  . Omega-3 Fatty Acids (FISH OIL PO) Take 1,000 Units by mouth daily. 1 capsule with a meal    . potassium chloride (K-DUR,KLOR-CON) 10 MEQ tablet Take 1 tablet by mouth every time you take furosemide (Lasix)    . pramipexole (MIRAPEX) 1 MG tablet Take 2 mg by mouth at bedtime.      . simvastatin (ZOCOR) 10 MG tablet Take 10 mg by mouth daily.      . vitamin C (ASCORBIC ACID) 500 MG tablet Take 500 mg by mouth daily.     No current facility-administered medications for this visit.     Allergies:   Benadryl [diphenhydramine hcl]; Antihistamines, chlorpheniramine-type; and Restoril [temazepam]    Social History:  The patient  reports that he has quit smoking. he has never used smokeless tobacco. He reports that he drinks alcohol. He reports that he does not use drugs.   Family History:  The patient's family history includes CAD in his father; Cancer in his mother; Diabetes in his father; Heart attack in his father; Heart disease in his father; Hypertension in his father and sister.    ROS:  Please see the history of present illness.   Otherwise, review of systems are positive for leg edema.   All other systems are reviewed and negative.    PHYSICAL EXAM: VS:  BP 100/76   Pulse 60   Ht 6' (1.829 m)   Wt 180 lb 6.4 oz (81.8 kg)   SpO2 98%   BMI 24.47 kg/m  , BMI Body mass index is 24.47 kg/m. GEN: Well nourished, well developed, in no acute distress  HEENT: normal  Neck: no JVD, carotid bruits, or masses Cardiac: RRR; no murmurs, rubs, or gallops,; bilateral leg edema  Respiratory:  clear to auscultation bilaterally, normal work of breathing GI: soft, nontender, nondistended, + BS MS: no deformity or atrophy  Skin: warm and dry, no  rash Neuro:  Strength and sensation are intact Psych: euthymic mood, full affect   EKG:   The ekg ordered today demonstrates atrial pacing, V pacing   Recent Labs: No results found for requested labs within last 8760 hours.   Lipid Panel    Component Value Date/Time  CHOL 119 (L) 05/04/2015 0818   TRIG 62 05/04/2015 0818   HDL 58 05/04/2015 0818   CHOLHDL 2.1 05/04/2015 0818   VLDL 12 05/04/2015 0818   LDLCALC 49 05/04/2015 0818     Other studies Reviewed: Additional studies/ records that were reviewed today with results demonstrating: labs from July 2018.   ASSESSMENT AND PLAN:  1. Chronic diastolic heart failure : Continues to have LE edema. SHOB persists.  Increase frequency of Lasix to day.  Can take Lasix in early afternoon if he has to go out in the morning. BMet in 1-2 weeks.  2. CAD: No angina on medical therapy.  3. AFib: Eliquis for stroke prevention.   4. Pacer: Followed by Dr. [name].  5. Aortic stenosis: mild by echo. 6. Leg edema: Elevate legs.  Increase frequency of Lasix. 7. OSA:  Severe by recent sleep study.  Hopefully will feel better with CPAP.   Current medicines are reviewed at length with the patient today.  The patient concerns regarding his medicines were addressed.  The following changes have been made:  Increase Lasix frequency  Labs/ tests ordered today include: BMet No orders of the defined types were placed in this encounter.   Recommend 150 minutes/week of aerobic exercise Low fat, low carb, high fiber diet recommended  Disposition:   FU in 6 months   Signed, Larae Grooms, MD  04/08/2017 2:02 PM    [facility] [place], [place], [place]  [address] Phone: [phone]; Fax: [phone]

## 2017-04-08 ENCOUNTER — Telehealth: Payer: Self-pay | Admitting: Neurology

## 2017-04-08 ENCOUNTER — Ambulatory Visit: Payer: Medicare HMO | Admitting: Interventional Cardiology

## 2017-04-08 ENCOUNTER — Encounter: Payer: Self-pay | Admitting: Interventional Cardiology

## 2017-04-08 VITALS — BP 100/76 | HR 60 | Ht 72.0 in | Wt 180.4 lb

## 2017-04-08 DIAGNOSIS — R600 Localized edema: Secondary | ICD-10-CM | POA: Diagnosis not present

## 2017-04-08 DIAGNOSIS — I4891 Unspecified atrial fibrillation: Secondary | ICD-10-CM | POA: Diagnosis not present

## 2017-04-08 DIAGNOSIS — I25119 Atherosclerotic heart disease of native coronary artery with unspecified angina pectoris: Secondary | ICD-10-CM | POA: Diagnosis not present

## 2017-04-08 DIAGNOSIS — I5032 Chronic diastolic (congestive) heart failure: Secondary | ICD-10-CM

## 2017-04-08 DIAGNOSIS — I359 Nonrheumatic aortic valve disorder, unspecified: Secondary | ICD-10-CM

## 2017-04-08 DIAGNOSIS — G4733 Obstructive sleep apnea (adult) (pediatric): Secondary | ICD-10-CM | POA: Diagnosis not present

## 2017-04-08 DIAGNOSIS — Z950 Presence of cardiac pacemaker: Secondary | ICD-10-CM | POA: Diagnosis not present

## 2017-04-08 MED ORDER — FUROSEMIDE 40 MG PO TABS: 40.0000 mg | ORAL_TABLET | Freq: Every day | ORAL | 3 refills | Status: DC

## 2017-04-08 MED ORDER — POTASSIUM CHLORIDE ER 10 MEQ PO TBCR: 10.0000 meq | EXTENDED_RELEASE_TABLET | Freq: Every day | ORAL | 3 refills | Status: DC

## 2017-04-08 MED ORDER — POTASSIUM CHLORIDE ER 10 MEQ PO TBCR
10.0000 meq | EXTENDED_RELEASE_TABLET | Freq: Every day | ORAL | 3 refills | Status: DC
Start: 2017-04-08 — End: 2017-04-08

## 2017-04-08 NOTE — Patient Instructions (Signed)
Medication Instructions:  Your physician has recommended you make the following change in your medication:   1. TAKE furosemide (lasix) 40 mg daily  2. TAKE potassium 10 mEq daily  Labwork: Your physician recommends that you return for lab work in: 1-2 weeks for BMET  Testing/Procedures: None ordered  Follow-Up: Your physician wants you to follow-up in: 6 months with Dr. Irish Lack. You will receive a reminder letter in the mail two months in advance. If you don't receive a letter, please call our office to schedule the follow-up appointment.   Any Other Special Instructions Will Be Listed Below (If Applicable).     If you need a refill on your cardiac medications before your next appointment, please call your pharmacy.

## 2017-04-08 NOTE — Telephone Encounter (Signed)
-----   Message from [name], MD sent at 04/03/2017  5:14 PM EST ----- I am sorry to have to ask David [name] to return for PAP titration, but this sleep study was highly abnormal. Multiple/frequent deep SpO2 desaturations, severe apnea and slow wave sleep parasomnia. CD

## 2017-04-08 NOTE — Telephone Encounter (Signed)
Called and discussed sleep study results with the patients wife. I went over the results and I informed her that the patient would need to come in for a CPAP titration study. Pt wife verbalized understanding and will wait for the call from the sleep lab

## 2017-04-10 DIAGNOSIS — R69 Illness, unspecified: Secondary | ICD-10-CM | POA: Diagnosis not present

## 2017-04-14 ENCOUNTER — Encounter: Payer: Medicare HMO | Admitting: *Deleted

## 2017-04-14 ENCOUNTER — Telehealth: Payer: Self-pay | Admitting: Cardiology

## 2017-04-14 NOTE — Telephone Encounter (Signed)
LMOVM reminding pt to send remote transmission.   

## 2017-04-16 ENCOUNTER — Encounter: Payer: Self-pay | Admitting: Cardiology

## 2017-04-22 ENCOUNTER — Other Ambulatory Visit: Payer: Medicare HMO

## 2017-04-22 DIAGNOSIS — I5032 Chronic diastolic (congestive) heart failure: Secondary | ICD-10-CM

## 2017-04-22 LAB — BASIC METABOLIC PANEL
BUN/Creatinine Ratio: 24 (ref 10–24)
BUN: 29 mg/dL — ABNORMAL HIGH (ref 8–27)
CO2: 26 mmol/L (ref 20–29)
CREATININE: 1.23 mg/dL (ref 0.76–1.27)
Calcium: 9.3 mg/dL (ref 8.6–10.2)
Chloride: 105 mmol/L (ref 96–106)
GFR calc Af Amer: 65 mL/min/{1.73_m2} (ref >59)
GFR calc non Af Amer: 56 mL/min/{1.73_m2} — ABNORMAL LOW (ref >59)
GLUCOSE: 117 mg/dL — AB (ref 65–99)
Potassium: 4.5 mmol/L (ref 3.5–5.2)
SODIUM: 141 mmol/L (ref 134–144)

## 2017-04-25 ENCOUNTER — Ambulatory Visit (INDEPENDENT_AMBULATORY_CARE_PROVIDER_SITE_OTHER): Payer: Medicare HMO | Admitting: *Deleted

## 2017-04-25 DIAGNOSIS — I442 Atrioventricular block, complete: Secondary | ICD-10-CM | POA: Diagnosis not present

## 2017-04-28 ENCOUNTER — Encounter: Payer: Self-pay | Admitting: Cardiology

## 2017-04-28 NOTE — Progress Notes (Signed)
Remote pacemaker transmission.   

## 2017-04-30 ENCOUNTER — Ambulatory Visit (INDEPENDENT_AMBULATORY_CARE_PROVIDER_SITE_OTHER): Payer: Medicare HMO | Admitting: Neurology

## 2017-04-30 DIAGNOSIS — I712 Thoracic aortic aneurysm, without rupture, unspecified: Secondary | ICD-10-CM

## 2017-04-30 DIAGNOSIS — I5030 Unspecified diastolic (congestive) heart failure: Secondary | ICD-10-CM

## 2017-04-30 DIAGNOSIS — I442 Atrioventricular block, complete: Secondary | ICD-10-CM

## 2017-04-30 DIAGNOSIS — G4731 Primary central sleep apnea: Secondary | ICD-10-CM | POA: Diagnosis not present

## 2017-04-30 DIAGNOSIS — Z950 Presence of cardiac pacemaker: Secondary | ICD-10-CM

## 2017-05-06 LAB — CUP PACEART REMOTE DEVICE CHECK
Brady Statistic AS VP Percent: 2.2 %
Brady Statistic RA Percent Paced: 97 %
Date Time Interrogation Session: 20181130181921
Implantable Lead Implant Date: 20121031
Implantable Lead Location: 753859
Implantable Lead Location: 753860
Lead Channel Pacing Threshold Pulse Width: 0.5 ms
Lead Channel Sensing Intrinsic Amplitude: 12 mV
Lead Channel Setting Pacing Amplitude: 1.25 V
Lead Channel Setting Pacing Amplitude: 1.5 V
MDC IDC LEAD IMPLANT DT: 20121031
MDC IDC MSMT BATTERY REMAINING LONGEVITY: 48 mo
MDC IDC MSMT BATTERY REMAINING PERCENTAGE: 46 %
MDC IDC MSMT BATTERY VOLTAGE: 2.86 V
MDC IDC MSMT LEADCHNL RA IMPEDANCE VALUE: 340 Ohm
MDC IDC MSMT LEADCHNL RA PACING THRESHOLD AMPLITUDE: 0.5 V
MDC IDC MSMT LEADCHNL RA SENSING INTR AMPL: 1.1 mV
MDC IDC MSMT LEADCHNL RV IMPEDANCE VALUE: 400 Ohm
MDC IDC MSMT LEADCHNL RV PACING THRESHOLD AMPLITUDE: 1 V
MDC IDC MSMT LEADCHNL RV PACING THRESHOLD PULSEWIDTH: 0.5 ms
MDC IDC PG IMPLANT DT: 20121031
MDC IDC SET LEADCHNL RV PACING PULSEWIDTH: 0.5 ms
MDC IDC SET LEADCHNL RV SENSING SENSITIVITY: 4 mV
MDC IDC STAT BRADY AP VP PERCENT: 98 %
MDC IDC STAT BRADY AP VS PERCENT: 1 %
MDC IDC STAT BRADY AS VS PERCENT: 1 %
MDC IDC STAT BRADY RV PERCENT PACED: 99 %
Pulse Gen Model: 2210
Pulse Gen Serial Number: 7280216

## 2017-05-12 ENCOUNTER — Other Ambulatory Visit: Payer: Self-pay | Admitting: Neurology

## 2017-05-12 DIAGNOSIS — I2510 Atherosclerotic heart disease of native coronary artery without angina pectoris: Secondary | ICD-10-CM

## 2017-05-12 DIAGNOSIS — I442 Atrioventricular block, complete: Secondary | ICD-10-CM

## 2017-05-12 DIAGNOSIS — I480 Paroxysmal atrial fibrillation: Secondary | ICD-10-CM

## 2017-05-12 DIAGNOSIS — G4733 Obstructive sleep apnea (adult) (pediatric): Secondary | ICD-10-CM

## 2017-05-12 NOTE — Procedures (Signed)
PATIENT'S NAME:  David [name], David [name] DOB:      06-12-1940      MRNo:    [id number]     DATE OF RECORDING: 04/30/2017 REFERRING M.D.:  Lajean Manes, MD Study Performed:   Titration to CPAP HISTORY:  This 76 year old male Patient returns for CPAP titration following a baseline study performed at [facility] on 03/31/2017 that showed an AHI of 32/ hr. and REM AHI of 33.4 and supine AHI of 44.5/hr. associated with SPo2 Nadir of 80%, and total time in Hypoxemia of 28 minutes. The patient endorsed the Epworth Sleepiness Scale at 14 points and has atrial fibrillation, nocturia, RLS , pacemaker patient,  and witnessed snoring. The patient's weight 179 pounds with a height of 72 (inches), resulting in a BMI of 24.2 kg/m2. The patient's neck circumference measured 15 inches.  CURRENT MEDICATIONS: Eliquis, Vitamin D, CoQ10, B12, Pepcid, Flonase, Lasix, Claritin, Lopressor, Naprosyn, Niaspan, Nitrostat, Omega 3, K-Dur, Mirapex, Zocor, Vitamin C    PROCEDURE:  This is a multichannel digital polysomnogram utilizing the SomnoStar 11.2 system.  Electrodes and sensors were applied and monitored per AASM Specifications.   EEG, EOG, Chin and Limb EMG, were sampled at 200 Hz.  ECG, Snore and Nasal Pressure, Thermal Airflow, Respiratory Effort, CPAP Flow and Pressure, Oximetry was sampled at 50 Hz. Digital video and audio were recorded.       CPAP was initiated at 5 cmH20 with heated humidity per AASM split night standards and pressure was advanced to 14 cmH20 because of hypopneas, apneas and desaturations.  At a PAP pressure of 13 cmH20, there was the best tolerance and a reduction of the AHI to 2.9 /hr. of obstructive sleep apnea with a sleep efficiency of 87%. At 14 cm water, the AHI was 0.0 but sleep efficiency was only 53 %   Lights Out was at 22:14 and Lights On at 04:54. Total recording time (TRT) was 400 minutes, with a total sleep time (TST) of 309 minutes. The patient's sleep latency was 20.5 minutes with 24  minutes of wake time after sleep onset.  REM latency was 358.5 minutes.  The sleep efficiency was 77.3 %.    SLEEP ARCHITECTURE: WASO (Wake after sleep onset) was 62.5 minutes.  There were 22 minutes in Stage N1, 198 minutes Stage N2, 84 minutes Stage N3 and 5 minutes in Stage REM.  The percentage of Stage N1 was 7.1%, Stage N2 was 64.1%, Stage N3 was 27.2% and Stage R (REM sleep) was 1.6%. The sleep architecture was notable for late onset REM sleep.  The arousals were noted as: 64 were spontaneous, 5 were associated with PLMs, and 5 were associated with respiratory events.  Audio and video analysis did not show any abnormal or unusual movements, behaviors, phonations or vocalizations.  EKG with paced rhythm noted.     RESPIRATORY ANALYSIS:  There was a total of 63 respiratory events: 47 obstructive apneas, 15 central apneas and 0 mixed apneas with a total of 62 apneas and an apnea index (AI) of 12.0 /hour. There was 1 hypopnea with a hypopnea index of 0.2/hour. The patient also had 0 respiratory event related arousals (RERAs).  The total APNEA/HYPOPNEA INDEX  (AHI) was 12.2 /hour and the total RESPIRATORY DISTURBANCE INDEX was 12.2/ hour  0 events occurred in REM sleep and 63 events in NREM. The REM AHI was 0 /hour versus a non-REM AHI of 12.4 /hour.  The patient spent 252 minutes of total sleep time in the supine position  and 57 minutes in non-supine. The supine AHI was 14.0, versus a non-supine AHI of 4.2.  OXYGEN SATURATION & C02:  The baseline 02 saturation was 98%, with the lowest being 89%. Time spent below 89% saturation equaled 0 minutes.  PERIODIC LIMB MOVEMENTS:   The patient had a total of 7 Periodic Limb Movements. The Periodic Limb Movement (PLM) index was 1.4 and the PLM Arousal index was 1.0 /hour.  Post-study, the patient indicated that sleep was better than usual. He commented on his sleep and absence of nocturia.  The patient was fitted with a Respironics Dream Wear FF Mask in  small size after he couldn't tolerated nasal interface.  DIAGNOSIS Dominantly Obstructive Sleep Apnea, responding to CPAP pressure at 13 and 14 cm water. 1. Reduced sleep efficiency.   PLANS/RECOMMENDATIONS: 1. Obstructive sleep apnea: Recommend auto- CPAP of 8-14 cm water pressure, with follow up at 2 to 3 months to assess response.   a. The sleep clinic recommends to reinforce behavioral treatment for obstructive sleep apnea, which should include: b. Avoidance of medications with muscle relaxant properties and alcohol prior to sleeping. c. Avoiding sleeping in the supine position (on one's back). d. Improvement of nasal patency if indicated.  DISCUSSION: the patient will use an auto-titration CPAP between 8 and 14 cm water with 2 cm EPR, heated humidity, and a FFM Respironics Dream Wear in small size.   A follow up appointment will be scheduled in the Sleep Clinic at [facility].   Please call [phone] with any questions.     I certify that I have reviewed the entire raw data recording prior to the issuance of this report in accordance with the Standards of Accreditation of the American Academy of Sleep Medicine (AASM)  [name], M.D.    05-12-2017  Diplomat, American Board of Psychiatry and Neurology  Diplomat, [place] [facility] Medical Director, [place] [facility]

## 2017-05-13 ENCOUNTER — Telehealth: Payer: Self-pay | Admitting: Neurology

## 2017-05-13 DIAGNOSIS — D492 Neoplasm of unspecified behavior of bone, soft tissue, and skin: Secondary | ICD-10-CM | POA: Diagnosis not present

## 2017-05-13 DIAGNOSIS — L570 Actinic keratosis: Secondary | ICD-10-CM | POA: Diagnosis not present

## 2017-05-13 DIAGNOSIS — S80811A Abrasion, right lower leg, initial encounter: Secondary | ICD-10-CM | POA: Diagnosis not present

## 2017-05-13 DIAGNOSIS — D229 Melanocytic nevi, unspecified: Secondary | ICD-10-CM | POA: Diagnosis not present

## 2017-05-13 NOTE — Telephone Encounter (Signed)
-----   Message from [name], MD sent at 05/12/2017  5:38 PM EST ----- Complex sleep apnea with mostly  OSA character, paced EKG rhythm - responding well to CPAP. Mr. Offord will use an auto-titration CPAP between 8 and 14 cm water with 2 cm EPR, heated humidity, and a FFM Respironics Dream Wear in small size.   Cc Dr [name]

## 2017-05-13 NOTE — Telephone Encounter (Signed)
Called patient to discuss sleep study results. No answer at this time. LVM for the patient to call back.   

## 2017-05-13 NOTE — Telephone Encounter (Signed)
I called David [name]. I advised David [name] that [name] reviewed their sleep study results and found that David [name] has sleep apnea. [name] recommends that David [name] starts a CPAP. I reviewed PAP compliance expectations with the David [name]. David [name] is agreeable to starting an auto-PAP. I advised David [name] that an order will be sent to a DME, [facility], and [facility] will call the David [name] within about one week after they file with the David [name]'s insurance. [facility] will show the David [name] how to use the machine, fit for masks, and troubleshoot the auto-PAP if needed. A follow up appt was made for insurance purposes with [name] on August 12, 2017 at 1:30 pm . David [name] verbalized understanding to arrive 15 minutes early and bring their auto-PAP. A letter with all of this information in it will be mailed to the David [name] as a reminder. I verified with the David [name] that the address we have on file is correct. David [name] verbalized understanding of results. David [name] had no questions at this time but was encouraged to call back if questions arise.

## 2017-05-21 DIAGNOSIS — G4733 Obstructive sleep apnea (adult) (pediatric): Secondary | ICD-10-CM | POA: Diagnosis not present

## 2017-06-20 ENCOUNTER — Telehealth: Payer: Self-pay | Admitting: Interventional Cardiology

## 2017-06-20 NOTE — Telephone Encounter (Signed)
Patient's wife calling back and states that she feels like the patient has been retaining fluid. She states that his legs are more swollen and that in the past couple of weeks he has not been able to walk as far without having to stop and catch his breath. She states that at times he has to sleep on 3 pillows. She states that he is trying to adjust to wearing the CPAP. She states that he has not weighed himself in the last week because they are in [place] visiting their daughter. She states that he has been taking lasix 40 mg QD and k-dur 10 mEq QD. Advised for the patient to try and keep his legs elevated, wear the compression stockings, avoid salt in his diet, and that he can take an extra dose of lasix and k-dur x 3 days and come in for lab check. She states that they are travelling back home this weekend and will be on the road Sat/Sun and that it may be hard to take the extra lasix. Advised that the patient take the extra dose of lasix and k-dur today and I will touch base with the wife on Monday to follow up on swelling/DOE. Wife verbalized understanding and thanked me for the call.

## 2017-06-20 NOTE — Telephone Encounter (Signed)
[name] is calling because David [name] is having some issues w/ Shortness and walking and gaining water weight . Think the Lasix needs to be increased . Would Like to speak with you . Please call   Thanks

## 2017-06-20 NOTE — Telephone Encounter (Signed)
Left message to call back  

## 2017-06-21 DIAGNOSIS — G4733 Obstructive sleep apnea (adult) (pediatric): Secondary | ICD-10-CM | POA: Diagnosis not present

## 2017-06-23 NOTE — Telephone Encounter (Signed)
Left message for patient to call back  

## 2017-06-24 ENCOUNTER — Telehealth: Payer: Self-pay | Admitting: Interventional Cardiology

## 2017-06-24 NOTE — Telephone Encounter (Signed)
Pt returned this office call  Pt will call back in 2 days.

## 2017-06-25 NOTE — Telephone Encounter (Signed)
Addressed. See phone note from 1/25

## 2017-06-25 NOTE — Telephone Encounter (Signed)
Called patient to follow up on swelling and DOE. Patient states that he is back in town now and that yesterday was the first day that he has doubled up on the lasix and k-dur. He states that his weight has come down a little, but his legs are still swollen and he is still he is still experiencing DOE. Advised patient to take his extra dose of lasix and k-dur today. Patient scheduled to see Dr. Irish Lack tomorrow at 1:40 PM for further evaluation. Patient verbalized understanding and thanked me for the call.

## 2017-06-26 ENCOUNTER — Ambulatory Visit: Payer: Medicare HMO | Admitting: Interventional Cardiology

## 2017-06-26 ENCOUNTER — Encounter: Payer: Self-pay | Admitting: Interventional Cardiology

## 2017-06-26 VITALS — BP 100/62 | HR 81 | Ht 72.0 in | Wt 194.0 lb

## 2017-06-26 DIAGNOSIS — R0602 Shortness of breath: Secondary | ICD-10-CM

## 2017-06-26 DIAGNOSIS — R600 Localized edema: Secondary | ICD-10-CM

## 2017-06-26 DIAGNOSIS — I2510 Atherosclerotic heart disease of native coronary artery without angina pectoris: Secondary | ICD-10-CM

## 2017-06-26 DIAGNOSIS — E785 Hyperlipidemia, unspecified: Secondary | ICD-10-CM

## 2017-06-26 DIAGNOSIS — I480 Paroxysmal atrial fibrillation: Secondary | ICD-10-CM | POA: Diagnosis not present

## 2017-06-26 MED ORDER — TORSEMIDE 20 MG PO TABS: 40.0000 mg | ORAL_TABLET | Freq: Two times a day (BID) | ORAL | 3 refills | Status: DC

## 2017-06-26 MED ORDER — POTASSIUM CHLORIDE ER 20 MEQ PO TBCR: 20.0000 meq | EXTENDED_RELEASE_TABLET | Freq: Every day | ORAL | 3 refills | Status: DC

## 2017-06-26 NOTE — Progress Notes (Signed)
Cardiology Office Note   Date:  06/26/2017   ID:  Imanuel [name], DOB [birth year], MRN [id number]  PCP:  Lajean Manes, MD    No chief complaint on file.  SHOB  Wt Readings from Last 3 Encounters:  06/26/17 194 lb (88 kg)  04/08/17 180 lb 6.4 oz (81.8 kg)  01/29/17 178 lb (80.7 kg)       History of Present Illness: David [name] is a 77 y.o. male  who had a pacemaker placed in 2012 for complete heart block. He does have a history of coronary artery disease and atrial fibrillation. He had an LAD atherectomy in 1999. He was started on Eliquis after A. fib was found on his pacemaker check in the past. Per his report, he is having more AFib by 2018 pacer check than prior. Up to 7 % of the time.   He has had diastolic heart failure. In 4/17, he has had leg swelling.Managed with diuretics and decreasing salt intake.  In 01/2016, he had some additional fatigue. He took some extra Lasix. His energy level improved. He had an echo showing no signficant change from prior. Normal LV function with mild AS.  Over the past week, he has had some fatigue and SHOB as well.  His weight had increased.  He felt that he was retaining more water.  THis has been more severe over the past several months.  He has had leg edema, which is getting more severe as well. .    He has been out of town.    He has had more fatigue while walking up stairs also.    He has been travelling more and notes eating more salty food.    Past Medical History:  Diagnosis Date  . Ascending aorta dilatation (HCC)    Fusiform dilatation ascending aorta 44 x 45 mm - 06/2007  . Complete heart block (HCC)    Treated with pacemaker in 02/2011 - Dr. [name]  . Coronary artery disease   . Coronary atherosclerosis of unspecified type of vessel, native or graft   . Gilbert's syndrome   . Heart murmur 04/25/09   Mild aortic calcification on echo 07/03/2009  . Hyperglycemia   . Hypertension   . Insomnia, unspecified     . Mixed hyperlipidemia   . Other postoperative functional disorders   . Pacemaker-[facility] 07/16/2011  . Peripheral neuropathy   . Pure hypercholesterolemia   . Restless leg syndrome   . Spermatocele    near left testicle 2 cm  . Spinal stenosis, lumbar   . Thoracic aneurysm without mention of rupture    Measured at 45 mm in the past. Consider repeat MRA in 2015.  [name] Thrombocytopenia ([place])    Averaging 140,000 since 2008  . Tubular adenoma 09/2012   Dr. Watt Climes repeat 4 years  . Type II or unspecified type diabetes mellitus without mention of complication, not stated as uncontrolled   . Unspecified sleep apnea     Past Surgical History:  Procedure Laterality Date  . APPENDECTOMY  1957  . CHOLECYSTECTOMY  1988  . CORONARY STENT PLACEMENT    . HEMORROIDECTOMY  1980  . HERNIA REPAIR Bilateral   . pacermaker  07/16/11   Dr. [name]  . TEE WITHOUT CARDIOVERSION N/A 06/14/2014   Procedure: TRANSESOPHAGEAL ECHOCARDIOGRAM (TEE);  Surgeon: [name], MD;  Location: [facility] ENDOSCOPY;  Service: Cardiovascular;  Laterality: N/A;     Current Outpatient Medications  Medication Sig Dispense Refill  . acetaminophen (TYLENOL)  500 MG tablet Take 500 mg by mouth every 6 (six) hours as needed for mild pain.    . cholecalciferol (VITAMIN D) 1000 UNITS tablet Take 1,000 Units by mouth daily.    . Coenzyme Q10 (COQ10) 100 MG CAPS Take 100 mg by mouth daily.     . Cyanocobalamin (VITAMIN B-12) 2500 MCG SUBL Place 1 tablet under the tongue daily.    [name] ELIQUIS 5 MG TABS tablet TAKE 1 TABLET TWICE A DAY 180 tablet 1  . famotidine (PEPCID) 10 MG tablet Take 10 mg by mouth daily.     . fluticasone (FLONASE) 50 MCG/ACT nasal spray Place 1 spray into both nostrils daily as needed for allergies or rhinitis.     . furosemide (LASIX) 40 MG tablet Take 40 mg by mouth 2 (two) times daily.    [name] loratadine (CLARITIN) 10 MG tablet Take 10 mg by mouth daily as needed for allergies.    . metoprolol (LOPRESSOR) 50 MG  tablet Take 25 mg by mouth 2 (two) times daily.     . Multiple Vitamin (MULTIVITAMIN) capsule TAKE 1 CAPSULE BY MOUTH THREE TIMES A WEEK    . niacin (NIASPAN) 1000 MG CR tablet Take 1 tablet (1,000 mg total) by mouth daily. 30 tablet 6  . nitroGLYCERIN (NITROSTAT) 0.4 MG SL tablet Place 1 tablet (0.4 mg total) under the tongue every 5 (five) minutes as needed. For chest pain 25 tablet 5  . Omega-3 Fatty Acids (FISH OIL PO) Take 1,000 Units by mouth daily. 1 capsule with a meal    . potassium chloride (K-DUR) 10 MEQ tablet Take 1 tablet (10 mEq total) daily by mouth. 90 tablet 3  . pramipexole (MIRAPEX) 1 MG tablet Take 2 mg by mouth at bedtime.      . simvastatin (ZOCOR) 10 MG tablet Take 10 mg by mouth daily.      . vitamin C (ASCORBIC ACID) 500 MG tablet Take 500 mg by mouth daily.     No current facility-administered medications for this visit.     Allergies:   Benadryl [diphenhydramine hcl]; Antihistamines, chlorpheniramine-type; and Restoril [temazepam]    Social History:  The patient  reports that he has quit smoking. he has never used smokeless tobacco. He reports that he drinks alcohol. He reports that he does not use drugs.   Family History:  The patient's family history includes CAD in his father; Cancer in his mother; Diabetes in his father; Heart attack in his father; Heart disease in his father; Hypertension in his father and sister.    ROS:  Please see the history of present illness.   Otherwise, review of systems are positive for leg edema, shortness of breath.   All other systems are reviewed and negative.    PHYSICAL EXAM: VS:  BP 100/62   Pulse 81   Ht 6' (1.829 m)   Wt 194 lb (88 kg)   SpO2 96%   BMI 26.31 kg/m  , BMI Body mass index is 26.31 kg/m. GEN: Well nourished, well developed, in no acute distress  HEENT: normal  Neck: moderate JVD, no carotid bruits, or masses Cardiac: premature beats noted; no murmurs, rubs, or gallops,n; marked LE pitting edema up to  the thighs edema  Respiratory:  clear to auscultation bilaterally, normal work of breathing GI: soft, nontender, nondistended, + BS MS: no deformity or atrophy  Skin: warm and dry, no rash Neuro:  Strength and sensation are intact Psych: euthymic mood, full affect  Recent Labs: 04/22/2017: BUN 29; Creatinine, Ser 1.23; Potassium 4.5; Sodium 141   Lipid Panel    Component Value Date/Time   CHOL 119 (L) 05/04/2015 0818   TRIG 62 05/04/2015 0818   HDL 58 05/04/2015 0818   CHOLHDL 2.1 05/04/2015 0818   VLDL 12 05/04/2015 0818   LDLCALC 49 05/04/2015 0818     Other studies Reviewed: Additional studies/ records that were reviewed today with results demonstrating: .   ASSESSMENT AND PLAN:  1. Chronic diastolic heart failure/SHOB:  Change to torsemide to 40 mg BID for now.  Daily weights as well.  Decreasing weight in the last few days.  He has gained 25 lbs over the past 6 months.  Edema significantly worse.  If oral diuretics don't work, may need to admit for IV diuresis.  This happened in 2016.  2. CAD: Prior atherectomy.  No clear angina, but current sx could be anginal equivalent.  Check echo to eval LV function.  3. AFib: Eliquis for stroke prevention.  Watch for bleeding.   If he is having more AFib, this could cause more heart failure sx.  Pacer check coming up.  4. PAcer: Continue regular checks. 5. Aortic stenosis: Mild AS in the past.   6. Leg edema: Diuretics.  Elevate legs. Stockings. 7. OSA: Severe OSA.  Started on CPAP.  He does not notice any difference as he still is getting used to this.    Current medicines are reviewed at length with the patient today.  The patient concerns regarding his medicines were addressed.  The following changes have been made:  Change diuretic  Labs/ tests ordered today include:  No orders of the defined types were placed in this encounter.   Recommend 150 minutes/week of aerobic exercise Low fat, low carb, high fiber diet  recommended  Disposition:   FU in 1 week or sooner if sx get worse   Signed, Larae Grooms, MD  06/26/2017 1:57 PM    [facility] [address], [place], [place]  [address] Phone: [phone]; Fax: [phone]

## 2017-06-26 NOTE — Patient Instructions (Signed)
Medication Instructions:  Your physician has recommended you make the following change in your medication:   STOP: furosemide (lasix)  START: torsemide (demadex) 40 mg (2 tablets) twice daily  INCREASE: potassium to 20 mEq daily  Labwork: TODAY: BNP, BMET, CBC  Your physician recommends that you return for lab work in: 1 week for BMET   Testing/Procedures: Your physician has requested that you have an echocardiogram. Echocardiography is a painless test that uses sound waves to create images of your heart. It provides your doctor with information about the size and shape of your heart and how well your heart's chambers and valves are working. This procedure takes approximately one hour. There are no restrictions for this procedure.    Follow-Up: Your physician recommends that you schedule a follow-up appointment in: 1 week with APP on Dr. Hassell Done team   Any Other Special Instructions Will Be Listed Below (If Applicable).  Make sure that you weigh yourself daily: Weigh at the same time everyday with the same amount of clothes on.     Echocardiogram An echocardiogram, or echocardiography, uses sound waves (ultrasound) to produce an image of your heart. The echocardiogram is simple, painless, obtained within a short period of time, and offers valuable information to your health care provider. The images from an echocardiogram can provide information such as:  Evidence of coronary artery disease (CAD).  Heart size.  Heart muscle function.  Heart valve function.  Aneurysm detection.  Evidence of a past heart attack.  Fluid buildup around the heart.  Heart muscle thickening.  Assess heart valve function.  Tell a health care provider about:  Any allergies you have.  All medicines you are taking, including vitamins, herbs, eye drops, creams, and over-the-counter medicines.  Any problems you or family members have had with anesthetic medicines.  Any blood  disorders you have.  Any surgeries you have had.  Any medical conditions you have.  Whether you are pregnant or may be pregnant. What happens before the procedure? No special preparation is needed. Eat and drink normally. What happens during the procedure?  In order to produce an image of your heart, gel will be applied to your chest and a wand-like tool (transducer) will be moved over your chest. The gel will help transmit the sound waves from the transducer. The sound waves will harmlessly bounce off your heart to allow the heart images to be captured in real-time motion. These images will then be recorded.  You may need an IV to receive a medicine that improves the quality of the pictures. What happens after the procedure? You may return to your normal schedule including diet, activities, and medicines, unless your health care provider tells you otherwise. This information is not intended to replace advice given to you by your health care provider. Make sure you discuss any questions you have with your health care provider. Document Released: 05/10/2000 Document Revised: 12/30/2015 Document Reviewed: 01/18/2013 Elsevier Interactive Patient Education  2017 [employer].    If you need a refill on your cardiac medications before your next appointment, please call your pharmacy.

## 2017-06-27 LAB — BASIC METABOLIC PANEL
BUN/Creatinine Ratio: 24 (ref 10–24)
BUN: 34 mg/dL — AB (ref 8–27)
CALCIUM: 9.2 mg/dL (ref 8.6–10.2)
CHLORIDE: 103 mmol/L (ref 96–106)
CO2: 26 mmol/L (ref 20–29)
Creatinine, Ser: 1.44 mg/dL — ABNORMAL HIGH (ref 0.76–1.27)
GFR, EST AFRICAN AMERICAN: 53 mL/min/{1.73_m2} — AB (ref >59)
GFR, EST NON AFRICAN AMERICAN: 46 mL/min/{1.73_m2} — AB (ref >59)
Glucose: 98 mg/dL (ref 65–99)
Potassium: 4.5 mmol/L (ref 3.5–5.2)
Sodium: 142 mmol/L (ref 134–144)

## 2017-06-27 LAB — CBC
HEMATOCRIT: 37.6 % (ref 37.5–51.0)
HEMOGLOBIN: 12.4 g/dL — AB (ref 13.0–17.7)
MCH: 32 pg (ref 26.6–33.0)
MCHC: 33 g/dL (ref 31.5–35.7)
MCV: 97 fL (ref 79–97)
Platelets: 133 10*3/uL — ABNORMAL LOW (ref 150–379)
RBC: 3.88 x10E6/uL — ABNORMAL LOW (ref 4.14–5.80)
RDW: 13.8 % (ref 12.3–15.4)
WBC: 6.7 10*3/uL (ref 3.4–10.8)

## 2017-06-27 LAB — PRO B NATRIURETIC PEPTIDE: NT-Pro BNP: 4725 pg/mL — ABNORMAL HIGH (ref 0–486)

## 2017-06-30 ENCOUNTER — Telehealth: Payer: Self-pay | Admitting: Interventional Cardiology

## 2017-06-30 DIAGNOSIS — Z0000 Encounter for general adult medical examination without abnormal findings: Secondary | ICD-10-CM | POA: Diagnosis not present

## 2017-06-30 DIAGNOSIS — E119 Type 2 diabetes mellitus without complications: Secondary | ICD-10-CM | POA: Diagnosis not present

## 2017-06-30 DIAGNOSIS — I77810 Thoracic aortic ectasia: Secondary | ICD-10-CM | POA: Diagnosis not present

## 2017-06-30 DIAGNOSIS — I10 Essential (primary) hypertension: Secondary | ICD-10-CM | POA: Diagnosis not present

## 2017-06-30 DIAGNOSIS — I480 Paroxysmal atrial fibrillation: Secondary | ICD-10-CM | POA: Diagnosis not present

## 2017-06-30 DIAGNOSIS — D696 Thrombocytopenia, unspecified: Secondary | ICD-10-CM | POA: Diagnosis not present

## 2017-06-30 DIAGNOSIS — E7800 Pure hypercholesterolemia, unspecified: Secondary | ICD-10-CM | POA: Diagnosis not present

## 2017-06-30 DIAGNOSIS — Z1389 Encounter for screening for other disorder: Secondary | ICD-10-CM | POA: Diagnosis not present

## 2017-06-30 NOTE — Telephone Encounter (Signed)
Patient calling to report that since starting the torsemide 40 mg BID he has lost 23 pounds and he is back to his normal weight. He states that he is less winded and that the swelling in his legs is better. Patient taking kdur 20 mEq QD as well. Advised patient to hold his PM dose of torsemide and continue taking the 40 mg just once a day for now. Will forward to Dr. Irish Lack for review and further advisement. Patient has appointment with Melina Copa, PA on 2/8 with repeat BMET.

## 2017-06-30 NOTE — Telephone Encounter (Signed)
New message   Pt c/o medication issue:  1. Name of Medication: torsemide (DEMADEX) 20 MG tablet  2. How are you currently taking this medication (dosage and times per day)? Take 2 tablets (40 mg total) by mouth 2 (two) times daily.  3. Are you having a reaction (difficulty breathing--STAT)? no  4. What is your medication issue? Need to report results for over the weekend and wants to know if it needs to be modified

## 2017-07-01 NOTE — Telephone Encounter (Signed)
I agree with decreasing dose of Torsemide and the plan outlined.

## 2017-07-01 NOTE — Telephone Encounter (Signed)
Left detailed message on patient's VM (DPR on file) letting the patient know that Dr. Irish Lack agreed with recommendations below. Instructed for patient to call back with any questions.

## 2017-07-03 ENCOUNTER — Encounter: Payer: Self-pay | Admitting: Physician Assistant

## 2017-07-03 NOTE — Progress Notes (Addendum)
Cardiology Office Note    Date:  07/04/2017  ID:  David [name], DOB February 28, 1941, MRN [id number] PCP:  Lajean Manes, MD  Cardiologist:  Dr. Irish Lack   Chief Complaint: f/u CHF  History of Present Illness:  David [name] is a 78 y.o. male with history of CHB s/p pacemaker 2012, CAD (LAD atherectomy 1999), paroxysmal atrial fib (initially identified by pacemaker), chronic diastolic CHF, ascending aortic dilation, Gilbert's syndrome, HTN, peripheral neuropathy/RLS, HLD, spinal stenosis, thrombocytopenia, DM, OSA, probable CKD III who presents for f/u of CHF. Last ischemic assessment was in 2012 with PET nuclear test with moderately reduced blood flow but no indication for angiography [facility]). Last echo 01/2016 showed moderate LVH, EF 55%, mildly reduced RV function, mildly dilated aortic root and ascending aorta, mild pulm HTN, dilated IVC, aortic sclerosis without stenosis. Last device check 04/25/17 showed <1% AT/AF burden, + 56 PMT episodes. He recently saw Dr. Irish Lack 06/26/17 with increased fatigue, SOB, weight gain, and leg edema. He had been traveling more and eating saltier food. Labs showed BNP 4725, Hgb 12.4 (prev baseline 13-14), plt 133 (chronic appearing), K 4.5, Cr 1.44 (was 1.23 in 2018). Dr. Irish Lack recommended to stop Lasix and change to Torsemide. 2D echo is scheduled later this month.  He returns for follow-up feeling markedly better. At peak his weight recently was 194, down to 169.8lb. He has a log of his weights back to 2016. It looks like in the summer before his recent weight gain, he was 165-172. He denies any CP, SOB. Edema has improved significantly. They are making an effort to reduce sodium intake. They were originally from [place] but have lived in [place] for 13 years. He does admit to feeling somewhat tired on the torsemide at times.    Past Medical History:  Diagnosis Date  . Ascending aorta dilatation (HCC)    a. mildly dilated aortic root and ascending aorta by echo  2017.  [name] CKD (chronic kidney disease), stage III ([place])   . Complete heart block (HCC)    a. s/p STJ pacemaker in 02/2011 - Dr. [name]  . Coronary artery disease    a. LAD atherectomy 1999.  . Gilbert's syndrome   . Heart murmur 04/25/2009   a. h/o aortic sclerosis  . Hypertension   . Insomnia, unspecified   . Pacemaker-St Judes 07/16/2011  . PAF (paroxysmal atrial fibrillation) ([place])    a. identified on pacemaker interrogation; on Eliquis.  . Peripheral neuropathy   . Pure hypercholesterolemia   . Restless leg syndrome   . Spermatocele    near left testicle 2 cm  . Spinal stenosis, lumbar   . Thrombocytopenia ([place])    Averaging 140,000 since 2008  . Tubular adenoma 09/2012   Dr. Watt Climes repeat 4 years  . Type II or unspecified type diabetes mellitus without mention of complication, not stated as uncontrolled   . Unspecified sleep apnea     Past Surgical History:  Procedure Laterality Date  . APPENDECTOMY  1957  . CHOLECYSTECTOMY  1988  . CORONARY STENT PLACEMENT    . HEMORROIDECTOMY  1980  . HERNIA REPAIR Bilateral   . pacermaker  07/16/11   Dr. [name]  . TEE WITHOUT CARDIOVERSION N/A 06/14/2014   Procedure: TRANSESOPHAGEAL ECHOCARDIOGRAM (TEE);  Surgeon: [name], MD;  [facility]COPY;  Service: Cardiovascular;  Laterality: N/A;    Current Medications: Current Meds  Medication Sig  . acetaminophen (TYLENOL) 500 MG tablet Take 500 mg by mouth every 6 (six) hours as needed  for mild pain.  . cholecalciferol (VITAMIN D) 1000 UNITS tablet Take 1,000 Units by mouth daily.  . Coenzyme Q10 (COQ10) 100 MG CAPS Take 100 mg by mouth daily.   . Cyanocobalamin (VITAMIN B-12) 2500 MCG SUBL Place 1 tablet under the tongue daily.  [name] ELIQUIS 5 MG TABS tablet TAKE 1 TABLET TWICE A DAY  . famotidine (PEPCID) 10 MG tablet Take 10 mg by mouth daily.   . fluticasone (FLONASE) 50 MCG/ACT nasal spray Place 1 spray into both nostrils daily as needed for allergies or rhinitis.   [name]  loratadine (CLARITIN) 10 MG tablet Take 10 mg by mouth daily as needed for allergies.  . metoprolol (LOPRESSOR) 50 MG tablet Take 25 mg by mouth 2 (two) times daily.   . Multiple Vitamin (MULTIVITAMIN) capsule TAKE 1 CAPSULE BY MOUTH THREE TIMES A WEEK  . niacin (NIASPAN) 1000 MG CR tablet Take 1 tablet (1,000 mg total) by mouth daily.  . nitroGLYCERIN (NITROSTAT) 0.4 MG SL tablet Place 1 tablet (0.4 mg total) under the tongue every 5 (five) minutes as needed. For chest pain  . Omega-3 Fatty Acids (FISH OIL PO) Take 1,000 Units by mouth daily. 1 capsule with a meal  . potassium chloride 20 MEQ TBCR Take 20 mEq by mouth daily.  . pramipexole (MIRAPEX) 1 MG tablet Take 2 mg by mouth at bedtime.    . simvastatin (ZOCOR) 10 MG tablet Take 10 mg by mouth daily.    [name] torsemide (DEMADEX) 20 MG tablet Take 2 tablets (40 mg total) by mouth 2 (two) times daily.  . vitamin C (ASCORBIC ACID) 500 MG tablet Take 500 mg by mouth daily.     Allergies:   Benadryl [diphenhydramine hcl]; Antihistamines, chlorpheniramine-type; and Restoril [temazepam]   Social History   Socioeconomic History  . Marital status: Married    Spouse name: None  . Number of children: None  . Years of education: None  . Highest education level: None  Social Needs  . Financial resource strain: None  . Food insecurity - worry: None  . Food insecurity - inability: None  . Transportation needs - medical: None  . Transportation needs - non-medical: None  Occupational History  . None  Tobacco Use  . Smoking status: Former Research scientist (life sciences)  . Smokeless tobacco: Never Used  Substance and Sexual Activity  . Alcohol use: Yes    Comment: occasional glass of wine  . Drug use: No  . Sexual activity: None  Other Topics Concern  . None  Social History Narrative  . None     Family History:  Family History  Problem Relation Age of Onset  . Cancer Mother        Small cell lung cancer  . Diabetes Father        DM  . CAD Father   .  Hypertension Father   . Heart disease Father   . Heart attack Father   . Hypertension Sister     ROS:   Please see the history of present illness.  All other systems are reviewed and otherwise negative.    PHYSICAL EXAM:   VS:  BP 118/70   Pulse 64   Ht 6' (1.829 m)   Wt 169 lb 12.8 oz (77 kg)   SpO2 98%   BMI 23.03 kg/m   BMI: Body mass index is 23.03 kg/m. GEN: Well nourished, well developed elderly WM in no acute distress  HEENT: normocephalic, atraumatic Neck: no JVD, carotid bruits, or masses  Cardiac: RRR; no murmurs, rubs, or gallops, no edema  Respiratory:  clear to auscultation bilaterally, normal work of breathing GI: soft, nontender, nondistended, + BS MS: no deformity or atrophy  Skin: warm and dry, no rash Neuro:  Alert and Oriented x 3, Strength and sensation are intact, follows commands Psych: euthymic mood, full affect  Wt Readings from Last 3 Encounters:  07/04/17 169 lb 12.8 oz (77 kg)  06/26/17 194 lb (88 kg)  04/08/17 180 lb 6.4 oz (81.8 kg)      Studies/Labs Reviewed:   EKG:  EKG was ordered today and personally reviewed by me and demonstrates AV paced rhythm 81bpm  Recent Labs: 06/26/2017: BUN 34; Creatinine, Ser 1.44; Hemoglobin 12.4; NT-Pro BNP 4,725; Platelets 133; Potassium 4.5; Sodium 142   Lipid Panel    Component Value Date/Time   CHOL 119 (L) 05/04/2015 0818   TRIG 62 05/04/2015 0818   HDL 58 05/04/2015 0818   CHOLHDL 2.1 05/04/2015 0818   VLDL 12 05/04/2015 0818   LDLCALC 49 05/04/2015 0818    Additional studies/ records that were reviewed today include: Summarized above.    ASSESSMENT & PLAN:   1. Acute on chronic diastolic CHF - significantly improved. Suspect this was precipitated by frequently eating out while traveling. His weight seems back to baseline. Dr. Irish Lack has already scheduled a BMET for today. This looks like it will come back to his box for review. If creatinine is stable, may be worthwhile to continue  present dose but if BUN/Cr are uptrending, would recommend to scale back (was on 40mg  BID initially, then 40mg  daily per phone note 2/6) - perhaps a PRN dose as needed for worsening edema/dyspnea/weight gain. Given recent downtrend in Hgb will also add a CBC today to ensure stability since he's on anticoagulation. Reviewed 2g sodium restriction, 2L fluid restriction, daily weights with patient. Echo pending. 2. Paroxysmal atrial fib - appears AV paced today. Pacer check in December was <1% AT/AF. Repeat check scheduled this month with EP. F/u CBC given Eliquis use. Need to keep an eye on dose given age and CKD. 3. CAD - no recent anginal symptoms. Does not appear he is on aspirin which I suspect is due to his concomitant Eliquis use and stable coronary status. Continue BB. 4. CKD stage III - BMET ordered for today. 5. Dilated ascending aorta/aortic root - pending 2D echo for reassessment. 6. Hyperlipidemia - he reports this is followed by Dr. [name]. I have asked him to speak with Dr. [name] about whether he wants him to continue Niacin given most recent studies suggesting lack of clinical benefit.  Disposition: F/u with Dr. Irish Lack in 3 months; also has EP f/u arranged.    Medication Adjustments/Labs and Tests Ordered: Current medicines are reviewed at length with the patient today.  Concerns regarding medicines are outlined above. Medication changes, Labs and Tests ordered today are summarized above and listed in the Patient Instructions accessible in Encounters.   Signed, Charlie Pitter, PA-C  07/04/2017 11:23 AM    [address], [place],   [address] Phone: [phone]; Fax: [phone]

## 2017-07-04 ENCOUNTER — Ambulatory Visit: Payer: Medicare HMO | Admitting: Physician Assistant

## 2017-07-04 ENCOUNTER — Telehealth: Payer: Self-pay

## 2017-07-04 ENCOUNTER — Other Ambulatory Visit: Payer: Medicare HMO | Admitting: *Deleted

## 2017-07-04 ENCOUNTER — Encounter: Payer: Self-pay | Admitting: Physician Assistant

## 2017-07-04 VITALS — BP 118/70 | HR 64 | Ht 72.0 in | Wt 169.8 lb

## 2017-07-04 DIAGNOSIS — I480 Paroxysmal atrial fibrillation: Secondary | ICD-10-CM

## 2017-07-04 DIAGNOSIS — I77810 Thoracic aortic ectasia: Secondary | ICD-10-CM | POA: Diagnosis not present

## 2017-07-04 DIAGNOSIS — I5033 Acute on chronic diastolic (congestive) heart failure: Secondary | ICD-10-CM

## 2017-07-04 DIAGNOSIS — R0602 Shortness of breath: Secondary | ICD-10-CM

## 2017-07-04 DIAGNOSIS — I2510 Atherosclerotic heart disease of native coronary artery without angina pectoris: Secondary | ICD-10-CM

## 2017-07-04 DIAGNOSIS — N183 Chronic kidney disease, stage 3 unspecified: Secondary | ICD-10-CM

## 2017-07-04 DIAGNOSIS — E785 Hyperlipidemia, unspecified: Secondary | ICD-10-CM

## 2017-07-04 LAB — BASIC METABOLIC PANEL
BUN/Creatinine Ratio: 23 (ref 10–24)
BUN: 37 mg/dL — ABNORMAL HIGH (ref 8–27)
CALCIUM: 9.8 mg/dL (ref 8.6–10.2)
CHLORIDE: 97 mmol/L (ref 96–106)
CO2: 29 mmol/L (ref 20–29)
Creatinine, Ser: 1.58 mg/dL — ABNORMAL HIGH (ref 0.76–1.27)
GFR, EST AFRICAN AMERICAN: 47 mL/min/{1.73_m2} — AB (ref >59)
GFR, EST NON AFRICAN AMERICAN: 41 mL/min/{1.73_m2} — AB (ref >59)
Glucose: 113 mg/dL — ABNORMAL HIGH (ref 65–99)
POTASSIUM: 4.2 mmol/L (ref 3.5–5.2)
SODIUM: 140 mmol/L (ref 134–144)

## 2017-07-04 MED ORDER — POTASSIUM CHLORIDE ER 20 MEQ PO TBCR: 10.0000 meq | EXTENDED_RELEASE_TABLET | Freq: Every day | ORAL | 3 refills | Status: DC

## 2017-07-04 MED ORDER — TORSEMIDE 20 MG PO TABS: 20.0000 mg | ORAL_TABLET | Freq: Every day | ORAL | 3 refills | Status: DC

## 2017-07-04 NOTE — Patient Instructions (Addendum)
Medication Instructions:  Your physician recommends that you continue on your current medications as directed. Please refer to the Current Medication list given to you today.   Labwork: TODAY:  BMET & CBC   Testing/Procedures: None ordered  Follow-Up: Your physician recommends that you schedule a follow-up appointment in: [place] DR. VARANASI   Any Other Special Instructions Will Be Listed Below (If Applicable). Please speak with your primary care doctor about whether he wants you to continue Niacin given studies suggesting lack of clinical benefit.    If you need a refill on your cardiac medications before your next appointment, please call your pharmacy.

## 2017-07-04 NOTE — Telephone Encounter (Signed)
Melina Copa, PA reviewed repeat BMET ordered by Dr. Irish Lack resulted today 07/04/17.   Dr. Irish Lack saw the patient on 06/26/17 and the patient was instructed to stop lasix, start torsemide 40 mg BID, and increase k-dur to 20 mEq QD. On 06/30/17 per phone note the patient's torsemide was decreased to 40 mg QD and k-dur remained at 20 mEq QD due to weight loss and improvement in SOB and swelling with plan to keep appointment with Melina Copa, PA today 07/04/17 with the repeat BMET.   Melina Copa, PA preliminary reviewed labs and instructed for me to call patient and have him decrease his torsemide to 20 mg QD and decrease his k-dur to 10 mEq QD. Called and made patient aware. Instructed for patient to monitor his weight daily and monitor for signs of increased swelling or SOB. Patient verbalized understanding and is in agreement with this plan. Melina Copa, PA has instructed for me to leave lab result in Dr. Hassell Done basket for review and further recommendations and decide when to repeat labs.

## 2017-07-05 LAB — CBC
HEMATOCRIT: 43 % (ref 37.5–51.0)
HEMOGLOBIN: 14.1 g/dL (ref 13.0–17.7)
MCH: 31.9 pg (ref 26.6–33.0)
MCHC: 32.8 g/dL (ref 31.5–35.7)
MCV: 97 fL (ref 79–97)
Platelets: 149 10*3/uL — ABNORMAL LOW (ref 150–379)
RBC: 4.42 x10E6/uL (ref 4.14–5.80)
RDW: 13.4 % (ref 12.3–15.4)
WBC: 7.1 10*3/uL (ref 3.4–10.8)

## 2017-07-07 ENCOUNTER — Other Ambulatory Visit: Payer: Self-pay

## 2017-07-07 DIAGNOSIS — I5032 Chronic diastolic (congestive) heart failure: Secondary | ICD-10-CM

## 2017-07-08 ENCOUNTER — Other Ambulatory Visit: Payer: Self-pay

## 2017-07-08 ENCOUNTER — Ambulatory Visit (HOSPITAL_COMMUNITY): Payer: Medicare HMO | Attending: Cardiology

## 2017-07-08 DIAGNOSIS — I129 Hypertensive chronic kidney disease with stage 1 through stage 4 chronic kidney disease, or unspecified chronic kidney disease: Secondary | ICD-10-CM | POA: Diagnosis not present

## 2017-07-08 DIAGNOSIS — Z950 Presence of cardiac pacemaker: Secondary | ICD-10-CM | POA: Insufficient documentation

## 2017-07-08 DIAGNOSIS — I480 Paroxysmal atrial fibrillation: Secondary | ICD-10-CM | POA: Insufficient documentation

## 2017-07-08 DIAGNOSIS — N189 Chronic kidney disease, unspecified: Secondary | ICD-10-CM | POA: Insufficient documentation

## 2017-07-08 DIAGNOSIS — I272 Pulmonary hypertension, unspecified: Secondary | ICD-10-CM | POA: Insufficient documentation

## 2017-07-08 DIAGNOSIS — G4730 Sleep apnea, unspecified: Secondary | ICD-10-CM | POA: Diagnosis not present

## 2017-07-08 DIAGNOSIS — R0602 Shortness of breath: Secondary | ICD-10-CM | POA: Diagnosis not present

## 2017-07-08 DIAGNOSIS — E1140 Type 2 diabetes mellitus with diabetic neuropathy, unspecified: Secondary | ICD-10-CM | POA: Diagnosis not present

## 2017-07-08 DIAGNOSIS — R011 Cardiac murmur, unspecified: Secondary | ICD-10-CM | POA: Diagnosis not present

## 2017-07-08 DIAGNOSIS — E1122 Type 2 diabetes mellitus with diabetic chronic kidney disease: Secondary | ICD-10-CM | POA: Insufficient documentation

## 2017-07-08 DIAGNOSIS — I2510 Atherosclerotic heart disease of native coronary artery without angina pectoris: Secondary | ICD-10-CM | POA: Diagnosis not present

## 2017-07-10 ENCOUNTER — Encounter: Payer: Self-pay | Admitting: Internal Medicine

## 2017-07-17 ENCOUNTER — Encounter: Payer: Self-pay | Admitting: Internal Medicine

## 2017-07-17 ENCOUNTER — Ambulatory Visit: Payer: Medicare HMO | Admitting: Internal Medicine

## 2017-07-17 ENCOUNTER — Other Ambulatory Visit: Payer: Medicare HMO

## 2017-07-17 VITALS — BP 110/68 | HR 72 | Temp 98.0°F | Ht 72.0 in | Wt 171.0 lb

## 2017-07-17 DIAGNOSIS — I442 Atrioventricular block, complete: Secondary | ICD-10-CM | POA: Diagnosis not present

## 2017-07-17 DIAGNOSIS — I5032 Chronic diastolic (congestive) heart failure: Secondary | ICD-10-CM

## 2017-07-17 DIAGNOSIS — I480 Paroxysmal atrial fibrillation: Secondary | ICD-10-CM

## 2017-07-17 DIAGNOSIS — Z950 Presence of cardiac pacemaker: Secondary | ICD-10-CM | POA: Diagnosis not present

## 2017-07-17 LAB — CUP PACEART INCLINIC DEVICE CHECK
Battery Remaining Longevity: 42 mo
Implantable Lead Implant Date: 20121031
Implantable Lead Location: 753860
Implantable Pulse Generator Implant Date: 20121031
Lead Channel Impedance Value: 387.5 Ohm
Lead Channel Pacing Threshold Amplitude: 1 V
Lead Channel Pacing Threshold Pulse Width: 0.5 ms
Lead Channel Pacing Threshold Pulse Width: 0.5 ms
Lead Channel Sensing Intrinsic Amplitude: 1.4 mV
Lead Channel Sensing Intrinsic Amplitude: 12 mV
Lead Channel Setting Pacing Amplitude: 1.375
Lead Channel Setting Pacing Amplitude: 1.5 V
MDC IDC LEAD IMPLANT DT: 20121031
MDC IDC LEAD LOCATION: 753859
MDC IDC MSMT BATTERY VOLTAGE: 2.84 V
MDC IDC MSMT LEADCHNL RA PACING THRESHOLD AMPLITUDE: 0.5 V
MDC IDC MSMT LEADCHNL RA PACING THRESHOLD AMPLITUDE: 0.5 V
MDC IDC MSMT LEADCHNL RA PACING THRESHOLD PULSEWIDTH: 0.5 ms
MDC IDC MSMT LEADCHNL RV IMPEDANCE VALUE: 437.5 Ohm
MDC IDC MSMT LEADCHNL RV PACING THRESHOLD AMPLITUDE: 1 V
MDC IDC MSMT LEADCHNL RV PACING THRESHOLD PULSEWIDTH: 0.5 ms
MDC IDC SESS DTM: 20190221123038
MDC IDC SET LEADCHNL RV PACING PULSEWIDTH: 0.5 ms
MDC IDC SET LEADCHNL RV SENSING SENSITIVITY: 4 mV
MDC IDC STAT BRADY RA PERCENT PACED: 98 %
MDC IDC STAT BRADY RV PERCENT PACED: 99.98 %
Pulse Gen Model: 2210
Pulse Gen Serial Number: 7280216

## 2017-07-17 NOTE — Progress Notes (Signed)
HPI David [name] returns today for ongoing evaluation of PPM in the setting of CHB, and chronic diastolic heart failure. He has a h/o PAF as well. He denies chest pain and since his diuresis his dyspnea has resolved. He was over 20 lbs fluid overloaded and has responded to torsemide. His weight has gone from 195 to 170. No syncope.  Allergies  Allergen Reactions  . Benadryl [Diphenhydramine Hcl] Other (See Comments)    Adverse reaction, has very strange behavior. Also hyperactive.  [name] Antihistamines, Chlorpheniramine-Type Palpitations  . Restoril [Temazepam] Other (See Comments) and Palpitations    Hyperactive     Current Outpatient Medications  Medication Sig Dispense Refill  . acetaminophen (TYLENOL) 500 MG tablet Take 500 mg by mouth every 6 (six) hours as needed for mild pain.    . cholecalciferol (VITAMIN D) 1000 UNITS tablet Take 1,000 Units by mouth daily.    . Coenzyme Q10 (COQ10) 100 MG CAPS Take 100 mg by mouth daily.     . Cyanocobalamin (VITAMIN B-12) 2500 MCG SUBL Place 1 tablet under the tongue daily.    [name] ELIQUIS 5 MG TABS tablet TAKE 1 TABLET TWICE A DAY 180 tablet 1  . famotidine (PEPCID) 10 MG tablet Take 10 mg by mouth daily.     . fluticasone (FLONASE) 50 MCG/ACT nasal spray Place 1 spray into both nostrils daily as needed for allergies or rhinitis.     [name] loratadine (CLARITIN) 10 MG tablet Take 10 mg by mouth daily as needed for allergies.    . metoprolol (LOPRESSOR) 50 MG tablet Take 25 mg by mouth 2 (two) times daily.     . Multiple Vitamin (MULTIVITAMIN) capsule TAKE 1 CAPSULE BY MOUTH THREE TIMES A WEEK    . niacin (NIASPAN) 1000 MG CR tablet Take 1 tablet (1,000 mg total) by mouth daily. 30 tablet 6  . nitroGLYCERIN (NITROSTAT) 0.4 MG SL tablet Place 1 tablet (0.4 mg total) under the tongue every 5 (five) minutes as needed. For chest pain 25 tablet 5  . Omega-3 Fatty Acids (FISH OIL PO) Take 1,000 Units by mouth daily. 1 capsule with a meal    . Potassium  Chloride ER 20 MEQ TBCR Take 10 mEq by mouth daily. 90 tablet 3  . pramipexole (MIRAPEX) 1 MG tablet Take 2 mg by mouth at bedtime.      . simvastatin (ZOCOR) 10 MG tablet Take 10 mg by mouth daily.      [name] torsemide (DEMADEX) 20 MG tablet Take 1 tablet (20 mg total) by mouth daily. 360 tablet 3  . vitamin C (ASCORBIC ACID) 500 MG tablet Take 500 mg by mouth daily.     No current facility-administered medications for this visit.      Past Medical History:  Diagnosis Date  . Ascending aorta dilatation (HCC)    a. mildly dilated aortic root and ascending aorta by echo 2017.  [name] CKD (chronic kidney disease), stage III ([place])   . Complete heart block (HCC)    a. s/p STJ pacemaker in 02/2011 - Dr. [name]  . Coronary artery disease    a. LAD atherectomy 1999.  . Gilbert's syndrome   . Heart murmur 04/25/2009   a. h/o aortic sclerosis  . Hypertension   . Insomnia, unspecified   . Pacemaker-St Judes 07/16/2011  . PAF (paroxysmal atrial fibrillation) ([place])    a. identified on pacemaker interrogation; on Eliquis.  . Peripheral neuropathy   . Pure hypercholesterolemia   .  Restless leg syndrome   . Spermatocele    near left testicle 2 cm  . Spinal stenosis, lumbar   . Thrombocytopenia ([place])    Averaging 140,000 since 2008  . Tubular adenoma 09/2012   Dr. Watt Climes repeat 4 years  . Type II or unspecified type diabetes mellitus without mention of complication, not stated as uncontrolled   . Unspecified sleep apnea     ROS:   All systems reviewed and negative except as noted in the HPI.   Past Surgical History:  Procedure Laterality Date  . APPENDECTOMY  1957  . CHOLECYSTECTOMY  1988  . CORONARY STENT PLACEMENT    . HEMORROIDECTOMY  1980  . HERNIA REPAIR Bilateral   . pacermaker  07/16/11   Dr. [name]  . TEE WITHOUT CARDIOVERSION N/A 06/14/2014   Procedure: TRANSESOPHAGEAL ECHOCARDIOGRAM (TEE);  Surgeon: [name], MD;  Location: [facility] ENDOSCOPY;  Service: Cardiovascular;  Laterality:  N/A;     Family History  Problem Relation Age of Onset  . Cancer Mother        Small cell lung cancer  . Diabetes Father        DM  . CAD Father   . Hypertension Father   . Heart disease Father   . Heart attack Father   . Hypertension Sister      Social History   Socioeconomic History  . Marital status: Married    Spouse name: Not on file  . Number of children: Not on file  . Years of education: Not on file  . Highest education level: Not on file  Social Needs  . Financial resource strain: Not on file  . Food insecurity - worry: Not on file  . Food insecurity - inability: Not on file  . Transportation needs - medical: Not on file  . Transportation needs - non-medical: Not on file  Occupational History  . Not on file  Tobacco Use  . Smoking status: Former Research scientist (life sciences)  . Smokeless tobacco: Never Used  Substance and Sexual Activity  . Alcohol use: Yes    Comment: occasional glass of wine  . Drug use: No  . Sexual activity: Not on file  Other Topics Concern  . Not on file  Social History Narrative  . Not on file     BP 110/68   Pulse 72   Temp 98 F (36.7 C)   Ht 6' (1.829 m)   Wt 171 lb (77.6 kg)   BMI 23.19 kg/m   Physical Exam:  Well appearing elderly man, NAD HEENT: Unremarkable Neck:  6 cm JVD, no thyromegally Lymphatics:  No adenopathy Back:  No CVA tenderness Lungs:  Clear with no wheezes HEART:  Regular rate rhythm, no murmurs, no rubs, no clicks Abd:  soft, positive bowel sounds, no organomegally, no rebound, no guarding Ext:  2 plus pulses, no edema, no cyanosis, no clubbing Skin:  No rashes no nodules Neuro:  CN II through XII intact, motor grossly intact  DEVICE  Normal device function.  See PaceArt for details.   Assess/Plan: 1. CHB - the patient is asymptomatic, s/p PPM insertion. 2. PPM - his St. Jude DDD PM is working normally. Will recheck in several months. 3. Chronic diastolic heart failure - he is now weighing himself on a  regular basis and his weight have been stable over the past week. He is encouraged to maintain a low sodium diet. No change in his diuretic dose today. 4. PAF - he is maintaining NSR very  nicely. His PM interogation demonstrates 99% NSR. No change in his meds.  [name].D.

## 2017-07-17 NOTE — Patient Instructions (Addendum)
Medication Instructions:  Your physician recommends that you continue on your current medications as directed. Please refer to the Current Medication list given to you today.  Labwork: You are getting a BMET today.  Testing/Procedures: None ordered.  Follow-Up: Your physician wants you to follow-up in: one year with Dr. Lovena Le.   You will receive a reminder letter in the mail two months in advance. If you don't receive a letter, please call our office to schedule the follow-up appointment.  Remote monitoring is used to monitor your Pacemaker from home. This monitoring reduces the number of office visits required to check your device to one time per year. It allows [place] to keep an eye on the functioning of your device to ensure it is working properly. You are scheduled for a device check from home on 07/24/2017. You may send your transmission at any time that day. If you have a wireless device, the transmission will be sent automatically. After your physician reviews your transmission, you will receive a postcard with your next transmission date.  Any Other Special Instructions Will Be Listed Below (If Applicable).  If you need a refill on your cardiac medications before your next appointment, please call your pharmacy.

## 2017-07-18 LAB — BASIC METABOLIC PANEL
BUN / CREAT RATIO: 24 (ref 10–24)
BUN: 39 mg/dL — ABNORMAL HIGH (ref 8–27)
CO2: 26 mmol/L (ref 20–29)
CREATININE: 1.62 mg/dL — AB (ref 0.76–1.27)
Calcium: 10 mg/dL (ref 8.6–10.2)
Chloride: 97 mmol/L (ref 96–106)
GFR calc non Af Amer: 40 mL/min/{1.73_m2} — ABNORMAL LOW (ref >59)
GFR, EST AFRICAN AMERICAN: 46 mL/min/{1.73_m2} — AB (ref >59)
Glucose: 99 mg/dL (ref 65–99)
POTASSIUM: 5.2 mmol/L (ref 3.5–5.2)
SODIUM: 140 mmol/L (ref 134–144)

## 2017-07-22 DIAGNOSIS — G4733 Obstructive sleep apnea (adult) (pediatric): Secondary | ICD-10-CM | POA: Diagnosis not present

## 2017-07-28 ENCOUNTER — Ambulatory Visit (INDEPENDENT_AMBULATORY_CARE_PROVIDER_SITE_OTHER): Payer: Medicare HMO | Admitting: *Deleted

## 2017-07-28 DIAGNOSIS — I442 Atrioventricular block, complete: Secondary | ICD-10-CM

## 2017-07-28 NOTE — Progress Notes (Signed)
Remote pacemaker transmission.   

## 2017-07-29 LAB — CUP PACEART REMOTE DEVICE CHECK
Brady Statistic AP VP Percent: 95 %
Brady Statistic AS VP Percent: 5.1 %
Brady Statistic AS VS Percent: 1 %
Brady Statistic RA Percent Paced: 95 %
Date Time Interrogation Session: 20190304080505
Implantable Lead Implant Date: 20121031
Implantable Lead Location: 753860
Lead Channel Pacing Threshold Amplitude: 1.125 V
Lead Channel Pacing Threshold Pulse Width: 0.5 ms
Lead Channel Sensing Intrinsic Amplitude: 12 mV
Lead Channel Setting Pacing Amplitude: 1.375
Lead Channel Setting Pacing Amplitude: 1.625
Lead Channel Setting Pacing Pulse Width: 0.5 ms
MDC IDC LEAD IMPLANT DT: 20121031
MDC IDC LEAD LOCATION: 753859
MDC IDC MSMT BATTERY REMAINING LONGEVITY: 44 mo
MDC IDC MSMT BATTERY REMAINING PERCENTAGE: 40 %
MDC IDC MSMT BATTERY VOLTAGE: 2.84 V
MDC IDC MSMT LEADCHNL RA IMPEDANCE VALUE: 380 Ohm
MDC IDC MSMT LEADCHNL RA PACING THRESHOLD AMPLITUDE: 0.625 V
MDC IDC MSMT LEADCHNL RA PACING THRESHOLD PULSEWIDTH: 0.5 ms
MDC IDC MSMT LEADCHNL RA SENSING INTR AMPL: 1.4 mV
MDC IDC MSMT LEADCHNL RV IMPEDANCE VALUE: 400 Ohm
MDC IDC PG IMPLANT DT: 20121031
MDC IDC SET LEADCHNL RV SENSING SENSITIVITY: 4 mV
MDC IDC STAT BRADY AP VS PERCENT: 1 %
MDC IDC STAT BRADY RV PERCENT PACED: 99 %
Pulse Gen Model: 2210
Pulse Gen Serial Number: 7280216

## 2017-07-30 ENCOUNTER — Encounter: Payer: Self-pay | Admitting: Cardiology

## 2017-07-31 ENCOUNTER — Telehealth: Payer: Self-pay

## 2017-07-31 NOTE — Telephone Encounter (Signed)
lmtcb for lab results and recommendations 

## 2017-07-31 NOTE — Telephone Encounter (Signed)
-----   Message from Evans Lance, MD sent at 07/30/2017  9:33 PM EST ----- No change in meds.

## 2017-08-01 ENCOUNTER — Telehealth: Payer: Self-pay | Admitting: Internal Medicine

## 2017-08-01 NOTE — Telephone Encounter (Signed)
David [name] is returning a call about some lab results .[name] Thanks

## 2017-08-01 NOTE — Telephone Encounter (Signed)
Reviewed lab results with pt who states understanding.  Discussed dose of Torsemide and potassium Chl in detail.  He will take as Rxed.

## 2017-08-10 ENCOUNTER — Encounter: Payer: Self-pay | Admitting: Neurology

## 2017-08-12 ENCOUNTER — Encounter: Payer: Self-pay | Admitting: Neurology

## 2017-08-12 ENCOUNTER — Ambulatory Visit: Payer: Medicare HMO | Admitting: Neurology

## 2017-08-12 VITALS — BP 118/71 | HR 63 | Ht 72.0 in | Wt 175.0 lb

## 2017-08-12 DIAGNOSIS — G4731 Primary central sleep apnea: Secondary | ICD-10-CM | POA: Diagnosis not present

## 2017-08-12 DIAGNOSIS — G4739 Other sleep apnea: Secondary | ICD-10-CM

## 2017-08-12 NOTE — Progress Notes (Signed)
SLEEP MEDICINE CLINIC   Provider:  [name], [name]  Primary Care Physician:  David Manes, MD   Referring Provider: Lajean Manes, MD   Chief Complaint  Patient presents with  . Follow-up    pt with wife, rm 11 pt states that he was able to start using better  after his 3rd attempt of a mask.  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 male , born in [place] and seen here as in a referral/ revisit  from [name] for a new sleep evaluation . Wife has reported irregular breathing at night, gasping and mild snoring.  He underwent a 2007 sleep study at [name], with an AHI of 1.0/hr. Bradycardia. No CPAP was necessary.  He has been treated for RLS since 1999, on sinemet , Klonopin, Requip and now Mirapex ,now at 2 mg . He later developed heart block and in 2012 a cardiac pacemaker was implanted. He now gets more restless during the night. He has no insomnia, falls asleep easily but has 1-2 bathroom breaks. He talks in his sleep. He has kicked a ball during one of his dreams and fell out of bed. He has neuropathy, too.   Sleep habits are as follows: his bedtime is usually between 11 pm and 1 AM, he likes to work on his computer late.  He falls asleep promptly, in his cool, dark and quiet environment. He has to urinate once at 4 AM, and goes back to sleep. He sleeps until 5 AM, and stays in bed a little longer , rising at 6.30 AM. He feels refreshed , he can concentrate, focus.  He does have vivid dreams sometimes, but does not always remember them. He often sleeps supine, and in that position produces most apnea and irregular breathing. His wife has confirmed that the supine sleep position seems to predispose him to apnea. He sleeps on 2 pillows. He is restless- he likes a hard, firm surface. He likes a weighted blanket.   Sleep medical history and family sleep history:   Parents had no sleep disorders. He developed with RLS in 1994, in [place], and  Bradycardia in 1999 in [place], [place].  Social history: father of [name], engineering degree PhD from [name]. No shift work history. No tobacco use, caffeine use : 1 in AM, 1 in PM. Alcohol- one a week.     08-12-2017, [name] reports he slept well during both sleep studies.  He underwent a baseline polysomnogram on 31 March 2017, at the time he was diagnosed with a moderate severe sleep apnea his AHI was 32/h without REM sleep accentuation but worse while in supine sleep position with an AHI of 44.5.  The time spent below 89% oxygen saturation equal 28 minutes he had multiple respiratory arousals but the overall sleep efficiency was still very good at 87.2% of the recorded time.  Study was followed by a CPAP titration which reduced his AHI under a pressure of 13 cmH2O to 2.9 he did not sleep better as we tried another slightly higher pressure of 14 cmH2O there were no apneas seen but the patient only slept about 50% of the recorded time.  He did not have nocturia, he did have multiple spontaneous arousals.  He was fitted with a full facemask after he could not get well used to a nasal interface.  I ordered an auto titration CPAP with a limited pressure range between 8 and  14 cmH2O and have today the first opportunity to look at a download.  He is 100% compliant with CPAP over the last 30 days, his residual AHI however is 13.3/h 4.6 of the central and 6.6 of these apneas obstructive in origin.  95th percentile pressure was 14 cmH2O, air leaks have been significantly reduced since he is using a full facemask.  We are meeting today as the patient and his wife have noticed his sleep to be more sound, less fragmented and overall much less restless.  [name] has not noted him to stop breathing and also has observed that the snoring is controlled which affects her sleep as well. However I would like to invite [name] back for a BiPAP or ASV titration.  My concern is at higher CPAP pressures will make  central apneas emerged more and at lower CPAP pressures do not treat the obstructive apneas enough.  I have already spoken to the sleep lab manager and we will be able to do a titration study within the next 14 days.  Review of Systems: Out of a complete 14 system review, the patient complains of only the following symptoms, and all other reviewed systems are negative.  Dozing off during the day, easy bruising and bleeding.  Epworth score 14 , Fatigue severity score 44  , depression score    Social History   Socioeconomic History  . Marital status: Married    Spouse name: Not on file  . Number of children: Not on file  . Years of education: Not on file  . Highest education level: Not on file  Social Needs  . Financial resource strain: Not on file  . Food insecurity - worry: Not on file  . Food insecurity - inability: Not on file  . Transportation needs - medical: Not on file  . Transportation needs - non-medical: Not on file  Occupational History  . Not on file  Tobacco Use  . Smoking status: Former Research scientist (life sciences)  . Smokeless tobacco: Never Used  Substance and Sexual Activity  . Alcohol use: Yes    Comment: occasional glass of wine  . Drug use: No  . Sexual activity: Not on file  Other Topics Concern  . Not on file  Social History Narrative  . Not on file    Family History  Problem Relation Age of Onset  . Cancer Mother        Small cell lung cancer  . Diabetes Father        DM  . CAD Father   . Hypertension Father   . Heart disease Father   . Heart attack Father   . Hypertension Sister     Past Medical History:  Diagnosis Date  . Ascending aorta dilatation (HCC)    a. mildly dilated aortic root and ascending aorta by echo 2017.  [name] CKD (chronic kidney disease), stage III ([place])   . Complete heart block (HCC)    a. s/p STJ pacemaker in 02/2011 - Dr. [name]  . Coronary artery disease    a. LAD atherectomy 1999.  . Gilbert's syndrome   . Heart murmur 04/25/2009   a.  h/o aortic sclerosis  . Hypertension   . Insomnia, unspecified   . Pacemaker-[facility] 07/16/2011  . PAF (paroxysmal atrial fibrillation) ([place])    a. identified on pacemaker interrogation; on Eliquis.  . Peripheral neuropathy   . Pure hypercholesterolemia   . Restless leg syndrome   . Spermatocele    near left  testicle 2 cm  . Spinal stenosis, lumbar   . Thrombocytopenia ([place])    Averaging 140,000 since 2008  . Tubular adenoma 09/2012   Dr. Watt Climes repeat 4 years  . Type II or unspecified type diabetes mellitus without mention of complication, not stated as uncontrolled   . Unspecified sleep apnea     Past Surgical History:  Procedure Laterality Date  . APPENDECTOMY  1957  . CHOLECYSTECTOMY  1988  . CORONARY STENT PLACEMENT    . HEMORROIDECTOMY  1980  . HERNIA REPAIR Bilateral   . pacermaker  07/16/11   Dr. [name]  . TEE WITHOUT CARDIOVERSION N/A 06/14/2014   Procedure: TRANSESOPHAGEAL ECHOCARDIOGRAM (TEE);  Surgeon: [name], MD;  [facility] ENDOSCOPY;  Service: Cardiovascular;  Laterality: N/A;    Current Outpatient Medications  Medication Sig Dispense Refill  . acetaminophen (TYLENOL) 500 MG tablet Take 500 mg by mouth every 6 (six) hours as needed for mild pain.    . cholecalciferol (VITAMIN [name]) 1000 UNITS tablet Take 1,000 Units by mouth daily.    . Coenzyme Q10 (COQ10) 100 MG CAPS Take 100 mg by mouth daily.     . Cyanocobalamin (VITAMIN B-12) 2500 MCG SUBL Place 1 tablet under the tongue daily.    [name] ELIQUIS 5 MG TABS tablet TAKE 1 TABLET TWICE A DAY 180 tablet 1  . famotidine (PEPCID) 10 MG tablet Take 10 mg by mouth daily.     . fluticasone (FLONASE) 50 MCG/ACT nasal spray Place 1 spray into both nostrils daily as needed for allergies or rhinitis.     [name] loratadine (CLARITIN) 10 MG tablet Take 10 mg by mouth daily as needed for allergies.    . metoprolol (LOPRESSOR) 50 MG tablet Take 25 mg by mouth 2 (two) times daily.     . Multiple Vitamin (MULTIVITAMIN) capsule TAKE  1 CAPSULE BY MOUTH THREE TIMES A WEEK    . niacin (NIASPAN) 1000 MG CR tablet Take 1 tablet (1,000 mg total) by mouth daily. 30 tablet 6  . nitroGLYCERIN (NITROSTAT) 0.4 MG SL tablet Place 1 tablet (0.4 mg total) under the tongue every 5 (five) minutes as needed. For chest pain 25 tablet 5  . Omega-3 Fatty Acids (FISH OIL PO) Take 1,000 Units by mouth daily. 1 capsule with a meal    . Potassium Chloride ER 20 MEQ TBCR Take 10 mEq by mouth daily. (Patient taking differently: Take 5 mEq by mouth daily. ) 90 tablet 3  . pramipexole (MIRAPEX) 1 MG tablet Take 2 mg by mouth at bedtime.      . simvastatin (ZOCOR) 10 MG tablet Take 10 mg by mouth daily.      [name] torsemide (DEMADEX) 20 MG tablet Take 1 tablet (20 mg total) by mouth daily. (Patient taking differently: Take 10 mg by mouth daily. ) 360 tablet 3  . vitamin C (ASCORBIC ACID) 500 MG tablet Take 500 mg by mouth daily.     No current facility-administered medications for this visit.     Allergies as of 08/12/2017 - Review Complete 08/12/2017  Allergen Reaction Noted  . Benadryl [diphenhydramine hcl] Other (See Comments) 04/18/2011  . Antihistamines, chlorpheniramine-type Palpitations 08/15/2014  . Restoril [temazepam] Other (See Comments) and Palpitations 08/09/2013    Vitals: BP 118/71   Pulse 63   Ht 6' (1.829 m)   Wt 175 lb (79.4 kg)   BMI 23.73 kg/m  Last Weight:  Wt Readings from Last 1 Encounters:  08/12/17 175 lb (79.4 kg)  DGU:YQIH mass index is 23.73 kg/m.     Last Height:   Ht Readings from Last 1 Encounters:  08/12/17 6' (1.829 m)    Physical exam:  General: The patient is awake, alert and appears not in acute distress. The patient is well groomed. Head: Normocephalic, atraumatic. Neck is supple. Mallampati 2,  neck circumference: 15.25. Nasal airflow patent ,  Cardiovascular:  irregular rate and rhythm,  Skipped beats -without carotid bruit, and without distended neck veins. Respiratory: Lungs are clear to  auscultation. Skin:  Without evidence of edema, or rash Trunk: BMI is 24. The patient's posture is erect   Neurologic exam : The patient is awake and alert, oriented to place and time.   Memory subjective described as intact.  Speech is fluent,  without  dysarthria, dysphonia or aphasia.  Mood and affect are appropriate.  Cranial nerves: Pupils are equal and briskly reactive to light. Funduscopic exam deferred. Extraocular movements  in vertical and horizontal planes intact and without nystagmus.  Visual fields by finger perimetry are intact. Hearing to finger rub intact. Facial sensation intact to fine touch. Facial motor strength is symmetric and tongue and uvula move midline. Shoulder shrug was symmetrical.  Equal motor tone and bulk.    Assessment:  After physical and neurologic examination, review of laboratory studies,  Personal review of imaging studies, reports of other /same  Imaging studies, results of polysomnography and / or neurophysiology testing and pre-existing records as far as provided in visit., my assessment is   1) atrial fibrillation, pacemaker patient - on Eloquis-  presenting with EDS and reduced nocturnal sleep time. Advancing his bed time and eliminating screen light in the hour prior.   2)  central apneas were witnessed and now confirmed in 2 sleep studies. CPAP made /caused central apneas emerging.  Will confirmed in attended sleep study with BiPAP or ASV .   3) RLS- neuropathy. Consider adding Neurontin, and changing from Mirapex to Requip now. He has done better on PAP - and medication controlled this condition.    The patient was advised of the nature of the diagnosed disorder , the treatment options and the  risks for general health and wellness arising from not treating the condition.   I spent more than 20 minutes of face to face time with the patient.  Greater than 50% of time was spent in counseling and coordination of care. We have discussed the  diagnosis and differential and I answered the patient's questions.    Plan:  Treatment plan and additional workup :  Will return for BiPAP.   David Seat, MD 4/74/2595, 6:38 PM  Certified in Neurology by ABPN Certified in [place] by [facility] [address] [place], [place] [address]

## 2017-08-19 DIAGNOSIS — G4733 Obstructive sleep apnea (adult) (pediatric): Secondary | ICD-10-CM | POA: Diagnosis not present

## 2017-08-21 DIAGNOSIS — G4733 Obstructive sleep apnea (adult) (pediatric): Secondary | ICD-10-CM | POA: Diagnosis not present

## 2017-08-25 ENCOUNTER — Other Ambulatory Visit: Payer: Self-pay | Admitting: Internal Medicine

## 2017-08-26 NOTE — Telephone Encounter (Signed)
77 yr old male who saw Dr Lovena Le on 07/17/17. Weight  Was 79.4Kg on 08/12/17. SCr was 1.62 on 07/17/17. Will refill Eliqus 5mg  BID.

## 2017-09-01 ENCOUNTER — Other Ambulatory Visit: Payer: Self-pay | Admitting: Interventional Cardiology

## 2017-09-01 MED ORDER — METOPROLOL TARTRATE 50 MG PO TABS: 25.0000 mg | ORAL_TABLET | Freq: Two times a day (BID) | ORAL | 3 refills | Status: DC

## 2017-09-01 NOTE — Telephone Encounter (Signed)
°*  STAT* If patient is at the pharmacy, call can be transferred to refill team.   1. Which medications need to be refilled? (please list name of each medication and dose if known) Metoprolol 50 mg   2. Which pharmacy/location (including street and city if local pharmacy) is medication to be sent to? [facility]   3. Do they need a 30 day or 90 day supply?[name]

## 2017-09-01 NOTE — Telephone Encounter (Signed)
Pt's medication was sent to pt's pharmacy as requested. Confirmation received.  °

## 2017-09-01 NOTE — Telephone Encounter (Signed)
°*  STAT* If patient is at the pharmacy, call can be transferred to refill team.   1. Which medications need to be refilled? (please list name of each medication and dose if known) Metoprolol 50 mg   2. Which pharmacy/location (including street and city if local pharmacy) is medication to be sent to?[facility] at [address]   3. Do they need a 30 day or 90 day supply? 30  Emergency Prescription

## 2017-09-02 ENCOUNTER — Other Ambulatory Visit: Payer: Self-pay | Admitting: *Deleted

## 2017-09-02 ENCOUNTER — Telehealth: Payer: Self-pay | Admitting: Interventional Cardiology

## 2017-09-02 MED ORDER — METOPROLOL TARTRATE 50 MG PO TABS: 25.0000 mg | ORAL_TABLET | Freq: Two times a day (BID) | ORAL | 0 refills | Status: DC

## 2017-09-02 NOTE — Telephone Encounter (Signed)
Returned pts wife call, [place], [place] on file.  She has been made aware that we have sent in a refill to his local pharmacy, [facility], while he is waiting on his mail order. She thanked me for the call back.

## 2017-09-02 NOTE — Telephone Encounter (Signed)
New Message      *STAT* If patient is at the pharmacy, call can be transferred to refill team.   1. Which medications need to be refilled? (please list name of each medication and dose if known) metoprolol tartrate (LOPRESSOR) 50 MG tablet  2. Which pharmacy/location (including street and city if local pharmacy) is medication to be sent to? [facility] [place]   3. Do they need a 30 day or 90 day supply? 30 day   Patient is completely out of medication while waiting on the mail order pharmacy to send them the rx. They request that a 30 day supply be sent to the local retail pharmacy.

## 2017-09-16 ENCOUNTER — Ambulatory Visit (INDEPENDENT_AMBULATORY_CARE_PROVIDER_SITE_OTHER): Payer: Medicare HMO | Admitting: Neurology

## 2017-09-16 DIAGNOSIS — G4739 Other sleep apnea: Secondary | ICD-10-CM

## 2017-09-16 DIAGNOSIS — G4731 Primary central sleep apnea: Secondary | ICD-10-CM | POA: Diagnosis not present

## 2017-09-19 DIAGNOSIS — G4733 Obstructive sleep apnea (adult) (pediatric): Secondary | ICD-10-CM | POA: Diagnosis not present

## 2017-09-28 NOTE — Procedures (Signed)
PATIENT'S NAME:  [name], [name] DOB:      06-07-1940      MR#:    [id number]     DATE OF RECORDING: 09/16/2017 REFERRING M.D.:  Lajean Manes, M.D. Study Performed:   Titration to positive airway pressure/ BiPAP HISTORY:  This established Patient of Dr. Virl Axe is returning for a BiPAP titration study to address central events. Patient had a baseline study on 03/31/2017 that showed an AHI of 32, REM AHI of 33.4,and Supine AHI of 44.5/h with oxygen saturation Nadir of 80%. David [name] has atrial fibrillation, has a pacemaker, is chronically anticoagulated, has RLS with neuropathy, and takes Mirapex.  The patient endorsed the Epworth Sleepiness Scale at 14 points.  The patient's weight 174 pounds with a height of 72 (inches), resulting in a BMI of 23.6 kg/m2.The patient's neck circumference measured 15.25 inches.  CURRENT MEDICATIONS: Eliquis, Vitamin D, CoQ10, B12, Pepcid, Flonase, Lasix, Claritin, Lopressor, Naprosyn, Niaspan, Nitrostat, Omega 3, K-Dur, Mirapex, Zocor, Vitamin C  PROCEDURE:  This is a multichannel digital polysomnogram utilizing the SomnoStar 11.2 system.  Electrodes and sensors were applied and monitored per AASM Specifications.   EEG, EOG, Chin and Limb EMG, were sampled at 200 Hz.  ECG, Snore and Nasal Pressure, Thermal Airflow, Respiratory Effort, CPAP Flow and Pressure, Oximetry was sampled at 50 Hz. Digital video and audio were recorded.      BiPAP was initiated at 9/5 cmH20 with heated humidity per AASM split night standards and due to a high AHI of 33.5/h needed advanced pressure. Central apneas emerged. An ST rate of 12 /min was added. None of these steps reduced the AHI. PAP-Modality was changed from BiPAP/ST to ASV. The ASV 15/5/3 pressure was advanced to 15/6 cmH20 with 4 cm EEP because of hypopneas, apneas and desaturations.  At an ASV-AP pressure of 15/7/4 cmH20, there was a reduction of the AHI to 0 with improvement of sleep apnea. No nocturia and no PLMs noted.    Lights Out was at 22:46 and Lights On at 05:06. Total recording time (TRT) was 380.5 minutes, with a total sleep time (TST) of 267 minutes. The patient's sleep latency was 2.5 minutes. REM latency was 60 minutes.  The sleep efficiency was 70.2 %.    SLEEP ARCHITECTURE: WASO (Wake after sleep onset) was 111 minutes.  There were 43 minutes in Stage N1, 95.5 minutes Stage N2, 88.5 minutes Stage N3 and 40 minutes in Stage REM.  The percentage of Stage N1 was 16.1%, Stage N2 was 35.8%, Stage N3 was 33.1% and Stage R (REM sleep) was 15.%.   RESPIRATORY ANALYSIS:  There was a total of 30 respiratory events: 0 obstructive apneas, 18 central apneas and 11 mixed apneas with a total of 29 apneas and an apnea index (AI) of 6.5 /hour. There were 1 hypopneas with a hypopnea index of 0.2/hour. The patient also had 0 respiratory event related arousals (RERAs). The total APNEA/HYPOPNEA INDEX (AHI) was 6.70. /hour and the total RESPIRATORY DISTURBANCE INDEX was 6.7/hour  0 events occurred in REM sleep and 30 events in NREM. The REM AHI was 0.0 /hour versus a non-REM AHI of 7.9 /hour.  The patient spent 193.5 minutes of total sleep time in the supine position and 74 minutes in non-supine. The supine AHI was 9.3, versus a non-supine AHI of 0.0.  OXYGEN SATURATION & C02:  The baseline 02 saturation was 96%, with the lowest being 65%. Time spent below 89% saturation equaled 0.8 minutes.  PERIODIC LIMB MOVEMENTS:  The patient had a total of 0 Periodic Limb Movements.  The arousals were noted as: 48 were spontaneous, 0 were associated with PLMs, and only 13 were associated with respiratory events. Audio and video analysis did not show any abnormal or unusual movements, behaviors, phonations or vocalizations.  EKG was in keeping with paced rhythm and bradycardia. The patient was fitted with a Respironics Dream wear FFM in size small.  DIAGNOSIS: Central Sleep Apnea, emerging under CPAP and BiPAP and finally controlled under  ASV after other modalities failed.    PLANS/RECOMMENDATIONS: We will place this patient on ASV- 15 maximum pressure support, 7 minimum pressure support and 4 cm EEP. Heated humidity and a Respironics Dream wear FFM in size small were added to the order.  A follow up appointment will be scheduled in the Sleep Clinic at [facility].   Please call [phone] with any questions.      I certify that I have reviewed the entire raw data recording prior to the issuance of this report in accordance with the Standards of Accreditation of the American Academy of Sleep Medicine (AASM)     [name], M.D.      05 09-2017  Diplomat, American Board of Psychiatry and Neurology  Diplomat, Tax adviser of Sleep Medicine Market researcher of [employer] [facility] at [employer]

## 2017-09-28 NOTE — Addendum Note (Signed)
Addended by: [name] on: 09/28/2017 05:02 PM   Modules accepted: Orders

## 2017-09-29 ENCOUNTER — Telehealth: Payer: Self-pay | Admitting: Neurology

## 2017-09-29 NOTE — Telephone Encounter (Signed)
-----   Message from [name], MD sent at 09/28/2017  5:02 PM EDT ----- Patients central apneas emerged under CPAP and BiPAP with or without ST ( respiratory rate setting) , ASV was needed and immediatly resolved  The complex apnea, alleviated PLMs and nocturia.  We will place this patient on ASV- 15  With a maximum pressure support, 7 cm EEP, minimum pressure of 4 cm water .  Heated humidity and a Respironics Dream wear FFM in size small were added to the order.

## 2017-09-29 NOTE — Telephone Encounter (Signed)
Called patient to discuss sleep study results. No answer at this time. LVM for the patient to call back.   

## 2017-09-29 NOTE — Telephone Encounter (Signed)
Pt returned call. I advised pt that [name] reviewed their sleep study results and found that pt  Had central apnea's that emerged under the bipap titration and so they applied a ASV machine to help and it was able to treat his apnea. [name] recommends that pt starts ASV machine. I reviewed PAP compliance expectations with the pt. Pt is agreeable to starting a CPAP. I advised pt that an order will be sent to a DME, [facility], and [facility] will call the pt within about one week after they file with the pt's insurance. [facility] will show the pt how to use the machine, fit for masks, and troubleshoot the CPAP if needed. A follow up appt was made for insurance purposes with Dr. Beacher May on Aug 7, 3:30 pm. Pt verbalized understanding to arrive 15 minutes early and bring their CPAP. A letter with all of this information in it will be mailed to the pt as a reminder. I verified with the pt that the address we have on file is correct. Pt verbalized understanding of results. Pt had no questions at this time but was encouraged to call back if questions arise.

## 2017-10-06 ENCOUNTER — Ambulatory Visit: Payer: Medicare HMO | Admitting: Interventional Cardiology

## 2017-10-06 ENCOUNTER — Encounter: Payer: Self-pay | Admitting: Interventional Cardiology

## 2017-10-06 VITALS — BP 118/74 | HR 70 | Ht 72.0 in | Wt 173.0 lb

## 2017-10-06 DIAGNOSIS — I480 Paroxysmal atrial fibrillation: Secondary | ICD-10-CM

## 2017-10-06 DIAGNOSIS — G4733 Obstructive sleep apnea (adult) (pediatric): Secondary | ICD-10-CM | POA: Diagnosis not present

## 2017-10-06 DIAGNOSIS — Z950 Presence of cardiac pacemaker: Secondary | ICD-10-CM

## 2017-10-06 DIAGNOSIS — I5032 Chronic diastolic (congestive) heart failure: Secondary | ICD-10-CM

## 2017-10-06 DIAGNOSIS — I2510 Atherosclerotic heart disease of native coronary artery without angina pectoris: Secondary | ICD-10-CM | POA: Diagnosis not present

## 2017-10-06 NOTE — Patient Instructions (Signed)

## 2017-10-06 NOTE — Progress Notes (Signed)
Cardiology Office Note   Date:  10/06/2017   ID:  David [name], DOB May 18, 1941, MRN [id number]  PCP:  Lajean Manes, MD    No chief complaint on file.  Chronic diastolic heart failure  Wt Readings from Last 3 Encounters:  10/06/17 173 lb (78.5 kg)  08/12/17 175 lb (79.4 kg)  07/17/17 171 lb (77.6 kg)       History of Present Illness: David [name] is a 77 y.o. male  who had a pacemaker placed in 2012 for complete heart block. He does have a history of coronary artery disease and atrial fibrillation. He had an LAD atherectomy in 1999. He was started on Eliquis after A. fib was found on his pacemaker check in the past. Per his report, he is having more AFib by 2018 pacer check than prior. Up to 7 % of the time.   He has had diastolic heart failure. In 4/17, he has had leg swelling.Managed with diuretics and decreasing salt intake.  In 01/2016, he had some additional fatigue. He took some extra Lasix. His energy level improved. He had an echo showing no signficant change from prior. Normal LV function with mild AS.  He had volume overload issues again in 1/19.  He was switched to torsemide and diurese well.    His weight has been controlled. He has skipped Torsemide when he feels dehydrated.  Denies : Chest pain. Dizziness. Leg edema. Nitroglycerin use. Orthopnea. Palpitations. Paroxysmal nocturnal dyspnea. Shortness of breath. Syncope.   No sx like his prior angina.    Past Medical History:  Diagnosis Date  . Ascending aorta dilatation (HCC)    a. mildly dilated aortic root and ascending aorta by echo 2017.  [name] CKD (chronic kidney disease), stage III ([place])   . Complete heart block (HCC)    a. s/p STJ pacemaker in 02/2011 - Dr. [name]  . Coronary artery disease    a. LAD atherectomy 1999.  . Gilbert's syndrome   . Heart murmur 04/25/2009   a. h/o aortic sclerosis  . Hypertension   . Insomnia, unspecified   . Pacemaker-St Judes 07/16/2011  . PAF (paroxysmal  atrial fibrillation) ([place])    a. identified on pacemaker interrogation; on Eliquis.  . Peripheral neuropathy   . Pure hypercholesterolemia   . Restless leg syndrome   . Spermatocele    near left testicle 2 cm  . Spinal stenosis, lumbar   . Thrombocytopenia ([place])    Averaging 140,000 since 2008  . Tubular adenoma 09/2012   Dr. Watt Climes repeat 4 years  . Type II or unspecified type diabetes mellitus without mention of complication, not stated as uncontrolled   . Unspecified sleep apnea     Past Surgical History:  Procedure Laterality Date  . APPENDECTOMY  1957  . CHOLECYSTECTOMY  1988  . CORONARY STENT PLACEMENT    . HEMORROIDECTOMY  1980  . HERNIA REPAIR Bilateral   . pacermaker  07/16/11   Dr. [name]  . TEE WITHOUT CARDIOVERSION N/A 06/14/2014   Procedure: TRANSESOPHAGEAL ECHOCARDIOGRAM (TEE);  Surgeon: [name], MD;  Location: [facility] ENDOSCOPY;  Service: Cardiovascular;  Laterality: N/A;     Current Outpatient Medications  Medication Sig Dispense Refill  . acetaminophen (TYLENOL) 500 MG tablet Take 500 mg by mouth every 6 (six) hours as needed for mild pain.    . cholecalciferol (VITAMIN D) 1000 UNITS tablet Take 1,000 Units by mouth daily.    . Coenzyme Q10 (COQ10) 100 MG CAPS Take 100 mg by  mouth daily.     . Cyanocobalamin (VITAMIN B-12) 2500 MCG SUBL Place 1 tablet under the tongue daily.    [name] ELIQUIS 5 MG TABS tablet TAKE 1 TABLET TWICE A DAY 180 tablet 1  . fluticasone (FLONASE) 50 MCG/ACT nasal spray Place 1 spray into both nostrils daily as needed for allergies or rhinitis.     [name] loratadine (CLARITIN) 10 MG tablet Take 10 mg by mouth daily as needed for allergies.    . metoprolol tartrate (LOPRESSOR) 50 MG tablet Take 0.5 tablets (25 mg total) by mouth 2 (two) times daily. 60 tablet 0  . Multiple Vitamin (MULTIVITAMIN) capsule TAKE 1 CAPSULE BY MOUTH THREE TIMES A WEEK    . niacin (NIASPAN) 1000 MG CR tablet Take 1 tablet (1,000 mg total) by mouth daily. 30 tablet 6  .  nitroGLYCERIN (NITROSTAT) 0.4 MG SL tablet Place 1 tablet (0.4 mg total) under the tongue every 5 (five) minutes as needed. For chest pain 25 tablet 5  . Omega-3 Fatty Acids (FISH OIL PO) Take 1,000 Units by mouth daily. 1 capsule with a meal    . potassium chloride SA (K-DUR,KLOR-CON) 20 MEQ tablet Take 5 mEq by mouth daily.    . pramipexole (MIRAPEX) 1 MG tablet Take 2 mg by mouth at bedtime.      . simvastatin (ZOCOR) 10 MG tablet Take 10 mg by mouth daily.      [name] torsemide (DEMADEX) 20 MG tablet Take 10 mg by mouth daily.    . vitamin C (ASCORBIC ACID) 500 MG tablet Take 500 mg by mouth daily.     No current facility-administered medications for this visit.     Allergies:   Benadryl [diphenhydramine hcl]; Antihistamines, chlorpheniramine-type; and Restoril [temazepam]    Social History:  The patient  reports that he has quit smoking. He has never used smokeless tobacco. He reports that he drinks alcohol. He reports that he does not use drugs.   Family History:  The patient's family history includes CAD in his father; Cancer in his mother; Diabetes in his father; Heart attack in his father; Heart disease in his father; Hypertension in his father and sister.    ROS:  Please see the history of present illness.   Otherwise, review of systems are positive for occasional DOE with walking up hills.   All other systems are reviewed and negative.    PHYSICAL EXAM: VS:  BP 118/74   Pulse 70   Ht 6' (1.829 m)   Wt 173 lb (78.5 kg)   SpO2 98%   BMI 23.46 kg/m  , BMI Body mass index is 23.46 kg/m. GEN: Well nourished, well developed, in no acute distress  HEENT: normal  Neck: no JVD, carotid bruits, or masses Cardiac: RRR; 2/6 systolic murmur,no rubs, or gallops,no edema  Respiratory:  clear to auscultation bilaterally, normal work of breathing GI: soft, nontender, nondistended, + BS MS: no deformity or atrophy  Skin: warm and dry, no rash Neuro:  Strength and sensation are  intact Psych: euthymic mood, full affect     Recent Labs: 06/26/2017: NT-Pro BNP 4,725 07/04/2017: Hemoglobin 14.1; Platelets 149 07/17/2017: BUN 39; Creatinine, Ser 1.62; Potassium 5.2; Sodium 140   Lipid Panel    Component Value Date/Time   CHOL 119 (L) 05/04/2015 0818   TRIG 62 05/04/2015 0818   HDL 58 05/04/2015 0818   CHOLHDL 2.1 05/04/2015 0818   VLDL 12 05/04/2015 0818   LDLCALC 49 05/04/2015 0818  Other studies Reviewed: Additional studies/ records that were reviewed today with results demonstrating: Cr 1.62 in 2/19.   ASSESSMENT AND PLAN:  1. Chronic diastolic heart failure/SHOB: Appears euvolemic.  COntinue diuretics.  OK to be flexible with the dose.  If he feels volume overloaded, he can take an extra torsemide and potassium.  If he feels dehydrated, he can skip a day.  2. CAD: No angina on medical therapy.  No plan for ischemic evaluation. Concerned if he needs Niaspan or not.  3. AFib: Eliquis for stroke prevention.  4. Pacemaker: Followed by Dr. [name].  5. Mild aortic stenosis: Murmur present. No syncope.  6. OSA: Switching to BiPap.   Current medicines are reviewed at length with the patient today.  The patient concerns regarding his medicines were addressed.  The following changes have been made:  No change  Labs/ tests ordered today include:  No orders of the defined types were placed in this encounter.   Recommend 150 minutes/week of aerobic exercise Low fat, low carb, high fiber diet recommended  Disposition:   FU in 6 months   Signed, Larae Grooms, MD  10/06/2017 11:26 AM    [facility] [place], [place], [place]  [address] Phone: [phone]; Fax: [phone]

## 2017-10-07 ENCOUNTER — Telehealth: Payer: Self-pay

## 2017-10-07 NOTE — Telephone Encounter (Signed)
Left message for patient to call back  

## 2017-10-07 NOTE — Telephone Encounter (Signed)
Called and made patient aware that he can stop his niacin but will need to continue his simvastatin. Made patient aware that we will need to recheck lipids in 3 months. Patient states that he is going to see his PCP around that time and will have his labs checked at that time. Asked that the patient have his PCP fax the results to [place]. Patient verbalized understanding and thanked me for the call.

## 2017-10-07 NOTE — Telephone Encounter (Signed)
-----   Message from Jettie Booze, MD sent at 10/07/2017 10:22 AM EDT ----- OK to stop Niaspan and recheck lipids in three months.  JV ----- Message ----- From: Leeroy Bock, [facility] Sent: 10/06/2017  12:35 PM To: Jettie Booze, MD  Yes, he should be just fine to stop his Niaspan altogether.  Thanks, Jinny Blossom  ----- Message ----- From: Jettie Booze, MD Sent: 10/06/2017  12:28 PM To: Megan E Supple, RPH  Do you think he can decrease or stop Niaspan?  H/o CAD many years ago.  12/24/16:TC128, HDL 57, LDL 59, TG 62.

## 2017-10-22 DIAGNOSIS — G4731 Primary central sleep apnea: Secondary | ICD-10-CM | POA: Diagnosis not present

## 2017-10-27 ENCOUNTER — Ambulatory Visit (INDEPENDENT_AMBULATORY_CARE_PROVIDER_SITE_OTHER): Payer: Medicare HMO | Admitting: *Deleted

## 2017-10-27 DIAGNOSIS — I442 Atrioventricular block, complete: Secondary | ICD-10-CM | POA: Diagnosis not present

## 2017-10-27 NOTE — Progress Notes (Signed)
Remote pacemaker transmission.   

## 2017-11-03 DIAGNOSIS — H43813 Vitreous degeneration, bilateral: Secondary | ICD-10-CM | POA: Diagnosis not present

## 2017-11-03 DIAGNOSIS — H353131 Nonexudative age-related macular degeneration, bilateral, early dry stage: Secondary | ICD-10-CM | POA: Diagnosis not present

## 2017-11-03 DIAGNOSIS — H52203 Unspecified astigmatism, bilateral: Secondary | ICD-10-CM | POA: Diagnosis not present

## 2017-11-03 DIAGNOSIS — H26492 Other secondary cataract, left eye: Secondary | ICD-10-CM | POA: Diagnosis not present

## 2017-11-04 DIAGNOSIS — M19011 Primary osteoarthritis, right shoulder: Secondary | ICD-10-CM | POA: Diagnosis not present

## 2017-11-04 DIAGNOSIS — M25511 Pain in right shoulder: Secondary | ICD-10-CM | POA: Diagnosis not present

## 2017-11-13 LAB — CUP PACEART REMOTE DEVICE CHECK
Brady Statistic AP VP Percent: 94 %
Brady Statistic AS VP Percent: 5.5 %
Brady Statistic RA Percent Paced: 93 %
Brady Statistic RV Percent Paced: 99 %
Date Time Interrogation Session: 20190603064003
Implantable Lead Implant Date: 20121031
Implantable Lead Location: 753859
Implantable Lead Location: 753860
Lead Channel Impedance Value: 390 Ohm
Lead Channel Pacing Threshold Pulse Width: 0.5 ms
Lead Channel Sensing Intrinsic Amplitude: 12 mV
Lead Channel Setting Pacing Amplitude: 1.5 V
MDC IDC LEAD IMPLANT DT: 20121031
MDC IDC MSMT BATTERY REMAINING LONGEVITY: 33 mo
MDC IDC MSMT BATTERY REMAINING PERCENTAGE: 29 %
MDC IDC MSMT BATTERY VOLTAGE: 2.81 V
MDC IDC MSMT LEADCHNL RA IMPEDANCE VALUE: 360 Ohm
MDC IDC MSMT LEADCHNL RA PACING THRESHOLD AMPLITUDE: 0.5 V
MDC IDC MSMT LEADCHNL RA SENSING INTR AMPL: 1.4 mV
MDC IDC MSMT LEADCHNL RV PACING THRESHOLD AMPLITUDE: 1 V
MDC IDC MSMT LEADCHNL RV PACING THRESHOLD PULSEWIDTH: 0.5 ms
MDC IDC PG IMPLANT DT: 20121031
MDC IDC SET LEADCHNL RV PACING AMPLITUDE: 1.25 V
MDC IDC SET LEADCHNL RV PACING PULSEWIDTH: 0.5 ms
MDC IDC SET LEADCHNL RV SENSING SENSITIVITY: 4 mV
MDC IDC STAT BRADY AP VS PERCENT: 1 %
MDC IDC STAT BRADY AS VS PERCENT: 1 %
Pulse Gen Model: 2210
Pulse Gen Serial Number: 7280216

## 2017-11-22 DIAGNOSIS — G4731 Primary central sleep apnea: Secondary | ICD-10-CM | POA: Diagnosis not present

## 2017-12-05 DIAGNOSIS — E1169 Type 2 diabetes mellitus with other specified complication: Secondary | ICD-10-CM | POA: Diagnosis not present

## 2017-12-05 DIAGNOSIS — I10 Essential (primary) hypertension: Secondary | ICD-10-CM | POA: Diagnosis not present

## 2017-12-05 DIAGNOSIS — I5032 Chronic diastolic (congestive) heart failure: Secondary | ICD-10-CM | POA: Diagnosis not present

## 2017-12-05 DIAGNOSIS — I480 Paroxysmal atrial fibrillation: Secondary | ICD-10-CM | POA: Diagnosis not present

## 2017-12-22 DIAGNOSIS — G4731 Primary central sleep apnea: Secondary | ICD-10-CM | POA: Diagnosis not present

## 2017-12-29 ENCOUNTER — Encounter: Payer: Self-pay | Admitting: Neurology

## 2017-12-29 DIAGNOSIS — Z79899 Other long term (current) drug therapy: Secondary | ICD-10-CM | POA: Diagnosis not present

## 2017-12-29 DIAGNOSIS — G4730 Sleep apnea, unspecified: Secondary | ICD-10-CM | POA: Diagnosis not present

## 2017-12-29 DIAGNOSIS — D696 Thrombocytopenia, unspecified: Secondary | ICD-10-CM | POA: Diagnosis not present

## 2017-12-29 DIAGNOSIS — I480 Paroxysmal atrial fibrillation: Secondary | ICD-10-CM | POA: Diagnosis not present

## 2017-12-29 DIAGNOSIS — E7800 Pure hypercholesterolemia, unspecified: Secondary | ICD-10-CM | POA: Diagnosis not present

## 2017-12-29 DIAGNOSIS — E1169 Type 2 diabetes mellitus with other specified complication: Secondary | ICD-10-CM | POA: Diagnosis not present

## 2017-12-29 DIAGNOSIS — I10 Essential (primary) hypertension: Secondary | ICD-10-CM | POA: Diagnosis not present

## 2017-12-29 DIAGNOSIS — I5032 Chronic diastolic (congestive) heart failure: Secondary | ICD-10-CM | POA: Diagnosis not present

## 2017-12-31 ENCOUNTER — Encounter: Payer: Self-pay | Admitting: Neurology

## 2017-12-31 ENCOUNTER — Ambulatory Visit: Payer: Medicare HMO | Admitting: Neurology

## 2017-12-31 ENCOUNTER — Telehealth: Payer: Self-pay | Admitting: Neurology

## 2017-12-31 VITALS — BP 105/61 | HR 60 | Ht 72.0 in | Wt 171.0 lb

## 2017-12-31 DIAGNOSIS — G629 Polyneuropathy, unspecified: Secondary | ICD-10-CM | POA: Diagnosis not present

## 2017-12-31 DIAGNOSIS — Z7189 Other specified counseling: Secondary | ICD-10-CM | POA: Diagnosis not present

## 2017-12-31 DIAGNOSIS — Z950 Presence of cardiac pacemaker: Secondary | ICD-10-CM | POA: Diagnosis not present

## 2017-12-31 NOTE — Progress Notes (Signed)
SLEEP MEDICINE CLINIC   Provider:  [name], [place] D  Primary Care Physician:  Lajean Manes, MD   Referring Provider: Lajean Manes, MD   Chief Complaint  Patient presents with  . Follow-up    pt with mom, rm 11. pt states that still has moments where the air blows hard but otherwise working well. 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 male , born in [place] and seen here as in a referral/ revisit  from Dr. [name] for a new sleep evaluation . Wife has reported irregular breathing at night, gasping and mild snoring. He underwent a 2007 sleep study at [place], with an AHI of 1.0/hr. Bradycardia. No CPAP was necessary.  He has been treated for RLS since 1999, on sinemet , Klonopin, Requip and now Mirapex ,now at 2 mg . He later developed heart block and in 2012 a cardiac pacemaker was implanted.He now gets more restless during the night. He has no insomnia, falls asleep easily but has 1-2 bathroom breaks. He talks in his sleep. He has kicked a ball during one of his dreams and fell out of bed. He has neuropathy, too.   Sleep habits are as follows: his bedtime is usually between 11 pm and 1 AM, he likes to work on his computer late.  He falls asleep promptly, in his cool, dark and quiet environment. He has to urinate once at 4 AM, and goes back to sleep. He sleeps until 5 AM, and stays in bed a little longer , rising at 6.30 AM. He feels refreshed , he can concentrate, focus.  He does have vivid dreams sometimes, but does not always remember them. He often sleeps supine, and in that position produces most apnea and irregular breathing. His wife has confirmed that the supine sleep position seems to predispose him to apnea. He sleeps on 2 pillows. He is restless- he likes a hard, firm surface. He likes a weighted blanket.   Sleep medical history and family sleep history:   Parents had no sleep disorders. He developed with RLS in 1994, in [place], and Bradycardia  in 1999 in [place], [place].  Social history: father of David [name], engineering degree PhD from [employer]. No shift work history. No tobacco use, caffeine use : 1 in AM, 1 in PM. Alcohol- one a week.     08-12-2017, David [name] reports he slept well during both sleep studies.  He underwent a baseline polysomnogram on 31 March 2017, at the time he was diagnosed with a moderate severe sleep apnea his AHI was 32/h without REM sleep accentuation but worse while in supine sleep position with an AHI of 44.5.  The time spent below 89% oxygen saturation equal 28 minutes he had multiple respiratory arousals but the overall sleep efficiency was still very good at 87.2% of the recorded time.  Study was followed by a CPAP titration which reduced his AHI under a pressure of 13 cmH2O to 2.9 he did not sleep better as we tried another slightly higher pressure of 14 cmH2O there were no apneas seen but the patient only slept about 50% of the recorded time.  He did not have nocturia, he did have multiple spontaneous arousals.  He was fitted with a full facemask after he could not get well used to a nasal interface.  I ordered an auto titration CPAP with a limited pressure range between 8 and 14 cmH2O and have today the first  opportunity to look at a download.  He is 100% compliant with CPAP over the last 30 days, his residual AHI however is 13.3/h 4.6 of the central and 6.6 of these apneas obstructive in origin.  95th percentile pressure was 14 cmH2O, air leaks have been significantly reduced since he is using a full facemask.  We are meeting today as the patient and his wife have noticed his sleep to be more sound, less fragmented and overall much less restless.  David. [name] has not noted him to stop breathing and also has observed that the snoring is controlled which affects her sleep as well. However I would like to invite David [name] back for a BiPAP or ASV titration.  My concern is at higher CPAP pressures will make central  apneas emerged more and at lower CPAP pressures do not treat the obstructive apneas enough.  I have already spoken to the sleep lab manager and we will be able to do a titration study within the next 14 days.  Result of ASV titration. Central Sleep Apnea, emerging under CPAP and BiPAP and finally controlled under ASV after other modalities failed.   PLANS/RECOMMENDATIONS:  We will place this patient on ASV- 15 maximum pressure support, 7 minimum pressure support and 4 cm EEP. Heated humidity and a Respironics Dream wear FFM in size small were added to the order. [name], M.D.    05 09-2017   12-31-2017, RV with David [name] present. Had abnormal creatinine levels, has complete heart block, pacemaker since 2012 ( after bradycardia), later had a CVA, recently  had severe water retention. Gained 20 pounds water weight while in [place] this summer when he didn't take his diuretics.    He uses ASV with success and noticed more alertness. This after failing first CPAP and then BiPAP.  He is chronically anticoagulated, has a pacemaker, and used to have more restless leg syndrome with neuropathy.  Controlled however on the Mirapex.  The patient did the best on an ASV machine with 15 cm maximum pressure support, 7 cm with a minimum pressure support and 4 cm expiratory range.  Heated humidity and a Respironics DreamWear full facemask was suggested.   He continues to follow with Dr. Lovena Le for electro- physiology. David [name] also reminded me that he has been taking Mirapex for about 17 years ever since it came to market for the treatment of restless legs.  His restless legs follow the development of neuropathy and he was interested to see if he still needs Mirapex, if the dose could be reduced or if other medications could supplement its effect.  We discussed Neurontin and for this reason the generic form of gabapentin I looked at his metabolic panels which revealed for February of this year a decrease in  creatinine clearance indicative of CKD stage II.     Review of Systems: Out of a complete 14 system review, the patient complains of only the following symptoms, and all other reviewed systems are negative.   Epworth score  7 since ASV from 14 , Fatigue severity score on ASV - 36 from 48  , response to ASV is positive.   Social History   Socioeconomic History  . Marital status: Married    Spouse name: Not on file  . Number of children: Not on file  . Years of education: Not on file  . Highest education level: Not on file  Occupational History  . Not on file  Social Needs  . Emergency planning/management officer  strain: Not on file  . Food insecurity:    Worry: Not on file    Inability: Not on file  . Transportation needs:    Medical: Not on file    Non-medical: Not on file  Tobacco Use  . Smoking status: Former Research scientist (life sciences)  . Smokeless tobacco: Never Used  Substance and Sexual Activity  . Alcohol use: Yes    Comment: occasional glass of wine  . Drug use: No  . Sexual activity: Not on file  Lifestyle  . Physical activity:    Days per week: Not on file    Minutes per session: Not on file  . Stress: Not on file  Relationships  . Social connections:    Talks on phone: Not on file    Gets together: Not on file    Attends religious service: Not on file    Active member of club or organization: Not on file    Attends meetings of clubs or organizations: Not on file    Relationship status: Not on file  . Intimate partner violence:    Fear of current or ex partner: Not on file    Emotionally abused: Not on file    Physically abused: Not on file    Forced sexual activity: Not on file  Other Topics Concern  . Not on file  Social History Narrative  . Not on file    Family History  Problem Relation Age of Onset  . Cancer Mother        Small cell lung cancer  . Diabetes Father        DM  . CAD Father   . Hypertension Father   . Heart disease Father   . Heart attack Father   .  Hypertension Sister     Past Medical History:  Diagnosis Date  . Ascending aorta dilatation (HCC)    a. mildly dilated aortic root and ascending aorta by echo 2017.  [name] CKD (chronic kidney disease), stage III ([place])   . Complete heart block (HCC)    a. s/p STJ pacemaker in 02/2011 - Dr. [name]  . Coronary artery disease    a. LAD atherectomy 1999.  . Gilbert's syndrome   . Heart murmur 04/25/2009   a. h/o aortic sclerosis  . Hypertension   . Insomnia, unspecified   . Pacemaker-St Judes 07/16/2011  . PAF (paroxysmal atrial fibrillation) ([place])    a. identified on pacemaker interrogation; on Eliquis.  . Peripheral neuropathy   . Pure hypercholesterolemia   . Restless leg syndrome   . Spermatocele    near left testicle 2 cm  . Spinal stenosis, lumbar   . Thrombocytopenia ([place])    Averaging 140,000 since 2008  . Tubular adenoma 09/2012   Dr. Watt Climes repeat 4 years  . Type II or unspecified type diabetes mellitus without mention of complication, not stated as uncontrolled   . Unspecified sleep apnea     Past Surgical History:  Procedure Laterality Date  . APPENDECTOMY  1957  . CHOLECYSTECTOMY  1988  . CORONARY STENT PLACEMENT    . HEMORROIDECTOMY  1980  . HERNIA REPAIR Bilateral   . pacermaker  07/16/11   Dr. [name]  . TEE WITHOUT CARDIOVERSION N/A 06/14/2014   Procedure: TRANSESOPHAGEAL ECHOCARDIOGRAM (TEE);  Surgeon: [name], MD;  Location: [facility] ENDOSCOPY;  Service: Cardiovascular;  Laterality: N/A;    Current Outpatient Medications  Medication Sig Dispense Refill  . acetaminophen (TYLENOL) 500 MG tablet Take 500 mg by mouth every  6 (six) hours as needed for mild pain.    . cholecalciferol (VITAMIN D) 1000 UNITS tablet Take 1,000 Units by mouth daily.    . Coenzyme Q10 (COQ10) 100 MG CAPS Take 100 mg by mouth daily.     . Cyanocobalamin (VITAMIN B-12) 2500 MCG SUBL Place 1 tablet under the tongue daily.    [name] ELIQUIS 5 MG TABS tablet TAKE 1 TABLET TWICE A DAY 180 tablet 1  .  fluticasone (FLONASE) 50 MCG/ACT nasal spray Place 1 spray into both nostrils daily as needed for allergies or rhinitis.     [name] loratadine (CLARITIN) 10 MG tablet Take 10 mg by mouth daily as needed for allergies.    . metoprolol tartrate (LOPRESSOR) 50 MG tablet Take 0.5 tablets (25 mg total) by mouth 2 (two) times daily. 60 tablet 0  . Multiple Vitamin (MULTIVITAMIN) capsule TAKE 1 CAPSULE BY MOUTH THREE TIMES A WEEK    . Omega-3 Fatty Acids (FISH OIL PO) Take 1,000 Units by mouth daily. 1 capsule with a meal    . potassium chloride SA (K-DUR,KLOR-CON) 20 MEQ tablet Take 5 mEq by mouth daily.    . pramipexole (MIRAPEX) 1 MG tablet Take 2 mg by mouth at bedtime.      . simvastatin (ZOCOR) 10 MG tablet Take 10 mg by mouth daily.      [name] torsemide (DEMADEX) 20 MG tablet Take 10 mg by mouth daily.    . vitamin C (ASCORBIC ACID) 500 MG tablet Take 500 mg by mouth daily.    . nitroGLYCERIN (NITROSTAT) 0.4 MG SL tablet Place 1 tablet (0.4 mg total) under the tongue every 5 (five) minutes as needed. For chest pain (Patient not taking: Reported on 12/31/2017) 25 tablet 5   No current facility-administered medications for this visit.     Allergies as of 12/31/2017 - Review Complete 12/31/2017  Allergen Reaction Noted  . Benadryl [diphenhydramine hcl] Other (See Comments) 04/18/2011  . Antihistamines, chlorpheniramine-type Palpitations 08/15/2014  . Restoril [temazepam] Other (See Comments) and Palpitations 08/09/2013    Vitals: BP 105/61   Pulse 60   Ht 6' (1.829 m)   Wt 171 lb (77.6 kg)   BMI 23.19 kg/m  Last Weight:  Wt Readings from Last 1 Encounters:  12/31/17 171 lb (77.6 kg)   IWP:YKDX mass index is 23.19 kg/m.     Last Height:   Ht Readings from Last 1 Encounters:  12/31/17 6' (1.829 m)    Physical exam:  General: The patient is awake, alert and appears not in acute distress. The patient is well groomed. Head: Normocephalic, atraumatic. Neck is supple. Mallampati 2,  neck  circumference: 15.25. Nasal airflow patent ,  Cardiovascular:  irregular rate and rhythm,  Skipped beats -without carotid bruit, and without distended neck veins. Respiratory: Lungs are clear to auscultation. Skin:  Without evidence of edema, or rash Trunk: BMI is 24. The patient's posture is erect   Neurologic exam : The patient is awake and alert, oriented to place and time.   Memory subjective described as intact.  Speech is fluent,  without  dysarthria, dysphonia or aphasia.  Mood and affect are appropriate.  Cranial nerves: Pupils are equal and briskly reactive to light. Funduscopic exam deferred. Extraocular movements  in vertical and horizontal planes intact and without nystagmus.  Visual fields by finger perimetry are intact.  Facial sensation intact to fine touch. Facial motor strength is symmetric and tongue and uvula move midline. Shoulder shrug was symmetrical.  Equal.  symmetric motor tone and bulk.      Assessment:  After physical and neurologic examination, review of laboratory studies,  Personal review of imaging studies, reports of other /same  Imaging studies, results of polysomnography and / or neurophysiology testing and pre-existing records as far as provided in visit., my assessment is   1) atrial fibrillation, pacemaker patient - on Eloquis-  presenting with EDS and reduced nocturnal sleep time. Advancing his bed time and eliminating screen light in the hour prior. Central apnea - on ASV with good success. atria fib related central apnea.   2) neuropathy and RLS , has CKD 2-  Therefore not adding Neurontin, and on to Mirapex.   The patient was advised of the nature of the diagnosed disorder , the treatment options and the  risks for general health and wellness arising from not treating the condition.   I spent more than 30 minutes of face to face time with the patient.  Greater than 50% of time was spent in counseling and coordination of care. We have discussed the  diagnosis and differential and I answered the patient's questions.    Plan:  Treatment plan and additional workup :  Keep on ASV , 15 max / 7 minimum pressure support and 4 cm EEP , FFM- residual AHI 10/h. Compliance is 97% , 4 hours 47 min.   Rv in 6 Month and then yearly.    [name], MD 07/05/209, 1:55 PM  Certified in Neurology by ABPN Certified in [place] by [facility] [address] [place], [place] [address]

## 2017-12-31 NOTE — Telephone Encounter (Signed)
Pt. States he will call back to schedule 6 mo f/u w/ MD

## 2018-01-07 DIAGNOSIS — I10 Essential (primary) hypertension: Secondary | ICD-10-CM | POA: Diagnosis not present

## 2018-01-07 DIAGNOSIS — I5032 Chronic diastolic (congestive) heart failure: Secondary | ICD-10-CM | POA: Diagnosis not present

## 2018-01-07 DIAGNOSIS — I480 Paroxysmal atrial fibrillation: Secondary | ICD-10-CM | POA: Diagnosis not present

## 2018-01-07 DIAGNOSIS — E1169 Type 2 diabetes mellitus with other specified complication: Secondary | ICD-10-CM | POA: Diagnosis not present

## 2018-01-09 ENCOUNTER — Telehealth: Payer: Self-pay | Admitting: Interventional Cardiology

## 2018-01-09 ENCOUNTER — Other Ambulatory Visit: Payer: Self-pay | Admitting: *Deleted

## 2018-01-09 MED ORDER — NITROGLYCERIN 0.4 MG SL SUBL: 0.4000 mg | SUBLINGUAL_TABLET | SUBLINGUAL | 5 refills | Status: AC | PRN

## 2018-01-09 NOTE — Telephone Encounter (Signed)
Rx sent in as requested. 

## 2018-01-09 NOTE — Telephone Encounter (Signed)
New Message      Nitrostat 0.4 mg      *STAT* If patient is at the pharmacy, call can be transferred to refill team.   1. Which medications need to be refilled? (please list name of each medication and dose if known) Nitrostat 0.04/Rx # [phone]  2. Which pharmacy/location (including street and city if local pharmacy) is medication to be sent to? [facility] [address]   3. Do they need a 30 day or 90 day supply? [place]

## 2018-01-22 DIAGNOSIS — G4731 Primary central sleep apnea: Secondary | ICD-10-CM | POA: Diagnosis not present

## 2018-02-03 DIAGNOSIS — I5032 Chronic diastolic (congestive) heart failure: Secondary | ICD-10-CM | POA: Diagnosis not present

## 2018-02-03 DIAGNOSIS — I480 Paroxysmal atrial fibrillation: Secondary | ICD-10-CM | POA: Diagnosis not present

## 2018-02-03 DIAGNOSIS — E1169 Type 2 diabetes mellitus with other specified complication: Secondary | ICD-10-CM | POA: Diagnosis not present

## 2018-02-03 DIAGNOSIS — I10 Essential (primary) hypertension: Secondary | ICD-10-CM | POA: Diagnosis not present

## 2018-02-05 ENCOUNTER — Other Ambulatory Visit: Payer: Self-pay

## 2018-02-05 ENCOUNTER — Encounter (HOSPITAL_COMMUNITY): Payer: Self-pay | Admitting: Emergency Medicine

## 2018-02-05 ENCOUNTER — Emergency Department (HOSPITAL_COMMUNITY)
Admission: EM | Admit: 2018-02-05 | Discharge: 2018-02-06 | Disposition: A | Discharge status: Home / Self Care | Payer: Medicare HMO | Attending: Emergency Medicine | Admitting: Emergency Medicine

## 2018-02-05 ENCOUNTER — Encounter: Payer: Medicare HMO | Admitting: *Deleted

## 2018-02-05 ENCOUNTER — Emergency Department (HOSPITAL_COMMUNITY): Payer: Medicare HMO

## 2018-02-05 ENCOUNTER — Telehealth: Payer: Self-pay

## 2018-02-05 ENCOUNTER — Ambulatory Visit (HOSPITAL_COMMUNITY)
Admission: EM | Admit: 2018-02-05 | Discharge: 2018-02-05 | Disposition: A | Discharge status: Home / Self Care | Payer: Medicare HMO | Source: Home / Self Care | Attending: Family Medicine | Admitting: Family Medicine

## 2018-02-05 DIAGNOSIS — I2510 Atherosclerotic heart disease of native coronary artery without angina pectoris: Secondary | ICD-10-CM | POA: Insufficient documentation

## 2018-02-05 DIAGNOSIS — E1122 Type 2 diabetes mellitus with diabetic chronic kidney disease: Secondary | ICD-10-CM | POA: Insufficient documentation

## 2018-02-05 DIAGNOSIS — Z87891 Personal history of nicotine dependence: Secondary | ICD-10-CM | POA: Insufficient documentation

## 2018-02-05 DIAGNOSIS — S199XXA Unspecified injury of neck, initial encounter: Secondary | ICD-10-CM | POA: Diagnosis not present

## 2018-02-05 DIAGNOSIS — Z79899 Other long term (current) drug therapy: Secondary | ICD-10-CM | POA: Diagnosis not present

## 2018-02-05 DIAGNOSIS — Z7901 Long term (current) use of anticoagulants: Secondary | ICD-10-CM | POA: Insufficient documentation

## 2018-02-05 DIAGNOSIS — S0990XA Unspecified injury of head, initial encounter: Secondary | ICD-10-CM | POA: Diagnosis not present

## 2018-02-05 DIAGNOSIS — Y929 Unspecified place or not applicable: Secondary | ICD-10-CM | POA: Insufficient documentation

## 2018-02-05 DIAGNOSIS — W01198A Fall on same level from slipping, tripping and stumbling with subsequent striking against other object, initial encounter: Secondary | ICD-10-CM | POA: Diagnosis not present

## 2018-02-05 DIAGNOSIS — N183 Chronic kidney disease, stage 3 (moderate): Secondary | ICD-10-CM | POA: Insufficient documentation

## 2018-02-05 DIAGNOSIS — Z950 Presence of cardiac pacemaker: Secondary | ICD-10-CM | POA: Insufficient documentation

## 2018-02-05 DIAGNOSIS — M25551 Pain in right hip: Secondary | ICD-10-CM | POA: Insufficient documentation

## 2018-02-05 DIAGNOSIS — Y999 Unspecified external cause status: Secondary | ICD-10-CM | POA: Diagnosis not present

## 2018-02-05 DIAGNOSIS — I129 Hypertensive chronic kidney disease with stage 1 through stage 4 chronic kidney disease, or unspecified chronic kidney disease: Secondary | ICD-10-CM | POA: Insufficient documentation

## 2018-02-05 DIAGNOSIS — S79911A Unspecified injury of right hip, initial encounter: Secondary | ICD-10-CM | POA: Diagnosis not present

## 2018-02-05 DIAGNOSIS — W19XXXA Unspecified fall, initial encounter: Secondary | ICD-10-CM

## 2018-02-05 DIAGNOSIS — Y9301 Activity, walking, marching and hiking: Secondary | ICD-10-CM | POA: Insufficient documentation

## 2018-02-05 DIAGNOSIS — S0101XA Laceration without foreign body of scalp, initial encounter: Secondary | ICD-10-CM | POA: Diagnosis not present

## 2018-02-05 MED ORDER — LIDOCAINE HCL (PF) 1 % IJ SOLN
30.0000 mL | Freq: Once | INTRAMUSCULAR | Status: AC
  Administered 2018-02-05: 30 mL
  Filled 2018-02-05: qty 30

## 2018-02-05 NOTE — ED Notes (Signed)
Patient has a laceration to top of head, bleeding controlled.  Soreness in right buttocks.  Requested patient to get into a gown to review injuries

## 2018-02-05 NOTE — ED Provider Notes (Signed)
MSE was initiated and I personally evaluated the patient  5:18 PM on February 05, 2018.  Patient placed in Quick Look pathway, seen and evaluated   Chief Complaint: Fall   HPI:  Patient is a 77 year old male who presents after mechanical fall this afternoon. Patient's foot got stuck between two pieces of furniture causing him to fall. He did hit his head, no LOC. Landed on the R hip. Mild discomfort to R hip and laceration to head, otherwise no complaints. Sent from urgent care. Patient is anticoagulated on Eliquis. He states his tetanus is up to date but cannot recall exact year it was given.   ROS: + laceration, R hip pain. - headache, change in vision, numbness, or weakness.   Physical Exam:   Gen: No distress  Neuro: Awake and Alert  Skin: Warm    Focused Exam: 4 cm laceration to R parietal area, no active bleeding. No battle sign or raccoon eyes. CN III-XII grossly intact. Sensation grossly intact x 4. Symmetric 5/5 grip strength and knee flexion/extension strength bilaterally. Mild tenderness to posterior R hip. Mild tenderness to cervical area without point/focal vertebral tenderness. No lumbar/thoracic tenderness.   Initiation of care has begun. The patient has been counseled on the process, plan, and necessity for staying for the completion/evaluation, and the remainder of the medical screening examination. I discussed with patient  and if present family/friends the importance of alerting staff to any new or worsening symptoms or any other concerns throughout his/her ER visit.      Leafy Kindle 02/05/18 1721    Davonna Belling, MD 02/06/18 [phone]

## 2018-02-05 NOTE — Discharge Instructions (Signed)
Treatment: Keep your wound dry and dressing applied until this time tomorrow. After 24 hours, you may wash with warm soapy water. Dry and apply antibiotic ointment and clean dressing. Do this daily until your sutures are removed. Use ice 3-4 times daily alternating 20 min on, 20 min off to help with swelling.   Follow-up: Please follow-up with your primary care provider or return to emergency department in 7-10 days for suture removal. Be aware of signs of infection: fever, increasing pain, redness, swelling, drainage from the area. Please call your primary care provider or return to emergency department if you develop any of these symptoms or if any of the sutures come out prior to removal. Please return to the emergency department if you develop any other new or worsening symptoms.

## 2018-02-05 NOTE — ED Provider Notes (Signed)
[facility] EMERGENCY DEPARTMENT Provider Note   CSN: [id number] Arrival date & time: 02/05/18  1655     History   Chief Complaint Chief Complaint  Patient presents with  . Fall  . Head Injury  . Head Laceration    HPI David [name] is a 77 y.o. male with history of CAD, CKD, atrial fibrillation anticoagulated on Eliquis, diabetes who presents with head laceration and right hip pain after a mechanical fall.  Patient reports he was having lunch with a friend when he was walking and got his foot caught between a table in a chair.  It made him fall and hit his head as well as his right hip.  He has been ambulatory since this happened around 2 PM.  His tetanus is up-to-date.  He did not hit his head or lose consciousness.  He denies any other pain.  He also denies any numbness or tingling, chest pain, shortness of breath, headache, nausea, vomiting.  Patient washed the wound out prior to arrival, but did not take any medications.  HPI  Past Medical History:  Diagnosis Date  . Ascending aorta dilatation (HCC)    a. mildly dilated aortic root and ascending aorta by echo 2017.  [name] CKD (chronic kidney disease), stage III ([place])   . Complete heart block (HCC)    a. s/p STJ pacemaker in 02/2011 - Dr. [name]  . Coronary artery disease    a. LAD atherectomy 1999.  . Gilbert's syndrome   . Heart murmur 04/25/2009   a. h/o aortic sclerosis  . Hypertension   . Insomnia, unspecified   . Pacemaker-St Judes 07/16/2011  . PAF (paroxysmal atrial fibrillation) ([place])    a. identified on pacemaker interrogation; on Eliquis.  . Peripheral neuropathy   . Pure hypercholesterolemia   . Restless leg syndrome   . Spermatocele    near left testicle 2 cm  . Spinal stenosis, lumbar   . Thrombocytopenia ([place])    Averaging 140,000 since 2008  . Tubular adenoma 09/2012   Dr. Watt Climes repeat 4 years  . Type II or unspecified type diabetes mellitus without mention of complication, not stated  as uncontrolled   . Unspecified sleep apnea     Patient Active Problem List   Diagnosis Date Noted  . Neuropathy 12/31/2017  . Encounter for counseling on adaptive servo-ventilation (ASV) use 12/31/2017  . OSA (obstructive sleep apnea) 04/08/2017  . RLS (restless legs syndrome) 01/29/2017  . Central sleep apnea 01/29/2017  . Chronic diastolic heart failure ([place]) 09/20/2015  . Aortic valve disorder 02/02/2015  . Acute heart failure ([place]) 12/29/2014  . Paroxysmal atrial fibrillation (HCC)   . Chest pressure   . Congestive heart disease ([place])   . Coronary artery disease involving native coronary artery of native heart with unstable angina pectoris ([place])   . CHB (complete heart block) ([place])   . Unstable angina pectoris ([place])   . Ascending aortic aneurysm ([place])   . Anticoagulation adequate with anticoagulant therapy 08/15/2014  . Atrial fibrillation ([place]) 07/13/2014  . Abnormal echocardiogram 06/14/2014  . Coronary atherosclerosis of native coronary artery 08/12/2013  . Aneurysm of thoracic aorta ([place]) 08/12/2013  . Edema 08/12/2013  . Dyslipidemia 07/09/2013  . Atrioventricular block, complete ([place]) 07/16/2011  . Pacemaker-St Jude 07/16/2011    Past Surgical History:  Procedure Laterality Date  . APPENDECTOMY  1957  . CHOLECYSTECTOMY  1988  . CORONARY STENT PLACEMENT    . HEMORROIDECTOMY  1980  . HERNIA REPAIR Bilateral   .  pacermaker  07/16/11   Dr. [name]  . TEE WITHOUT CARDIOVERSION N/A 06/14/2014   Procedure: TRANSESOPHAGEAL ECHOCARDIOGRAM (TEE);  Surgeon: [name], MD;  Location: [facility] ENDOSCOPY;  Service: Cardiovascular;  Laterality: N/A;        Home Medications    Prior to Admission medications   Medication Sig Start Date End Date Taking? Authorizing Provider  acetaminophen (TYLENOL) 500 MG tablet Take 500 mg by mouth every 6 (six) hours as needed for mild pain.    [provider]  cholecalciferol (VITAMIN D) 1000 UNITS tablet Take 1,000 Units by mouth  daily.    [provider]  Coenzyme Q10 (COQ10) 100 MG CAPS Take 100 mg by mouth daily.     [provider]  Cyanocobalamin (VITAMIN B-12) 2500 MCG SUBL Place 1 tablet under the tongue daily.    [provider]  ELIQUIS 5 MG TABS tablet TAKE 1 TABLET TWICE A DAY 08/26/17   Evans Lance, MD  fluticasone [facility]) 50 MCG/ACT nasal spray Place 1 spray into both nostrils daily as needed for allergies or rhinitis.     [provider]  loratadine (CLARITIN) 10 MG tablet Take 10 mg by mouth daily as needed for allergies.    [provider]  metoprolol tartrate (LOPRESSOR) 50 MG tablet Take 0.5 tablets (25 mg total) by mouth 2 (two) times daily. 09/02/17   Jettie Booze, MD  Multiple Vitamin (MULTIVITAMIN) capsule TAKE 1 CAPSULE BY MOUTH THREE TIMES A WEEK    [provider]  nitroGLYCERIN (NITROSTAT) 0.4 MG SL tablet Place 1 tablet (0.4 mg total) under the tongue every 5 (five) minutes as needed. For chest pain 01/09/18   Jettie Booze, MD  Omega-3 Fatty Acids (FISH OIL PO) Take 1,000 Units by mouth daily. 1 capsule with a meal    [provider]  potassium chloride SA (K-DUR,KLOR-CON) 20 MEQ tablet Take 5 mEq by mouth daily.    [provider]  pramipexole (MIRAPEX) 1 MG tablet Take 2 mg by mouth at bedtime.      [provider]  simvastatin (ZOCOR) 10 MG tablet Take 10 mg by mouth daily.      [provider]  torsemide (DEMADEX) 20 MG tablet Take 10 mg by mouth daily.    [provider]  vitamin C (ASCORBIC ACID) 500 MG tablet Take 500 mg by mouth daily.    [provider]    Family History Family History  Problem Relation Age of Onset  . Cancer Mother        Small cell lung cancer  . Diabetes Father        DM  . CAD Father   . Hypertension Father   . Heart disease Father   . Heart attack Father   . Hypertension Sister     Social History Social History   Tobacco Use  .  Smoking status: Former Research scientist (life sciences)  . Smokeless tobacco: Never Used  Substance Use Topics  . Alcohol use: Yes    Comment: occasional glass of wine  . Drug use: No     Allergies   Benadryl [diphenhydramine hcl]; Antihistamines, chlorpheniramine-type; and Restoril [temazepam]   Review of Systems Review of Systems  Constitutional: Negative for chills and fever.  HENT: Negative for facial swelling and sore throat.   Respiratory: Negative for shortness of breath.   Cardiovascular: Negative for chest pain.  Gastrointestinal: Negative for abdominal pain, nausea and vomiting.  Genitourinary: Negative for dysuria.  Musculoskeletal:  Positive for arthralgias. Negative for back pain.  Skin: Positive for color change and wound. Negative for rash.  Neurological: Negative for syncope and headaches.  Psychiatric/Behavioral: The patient is not nervous/anxious.      Physical Exam Updated Vital Signs BP 108/63   Pulse (!) 59   Temp 97.6 F (36.4 C) (Oral)   Resp 18   Ht 6' (1.829 m)   Wt 77.1 kg   SpO2 99%   BMI 23.06 kg/m   Physical Exam  Constitutional: He appears well-developed and well-nourished. No distress.  HENT:  Head: Normocephalic and atraumatic.  4 cm laceration to apex of scalp with underlying hematoma  Eyes: Pupils are equal, round, and reactive to light. Conjunctivae and EOM are normal. Right eye exhibits no discharge. Left eye exhibits no discharge. No scleral icterus.  Neck: Normal range of motion. Neck supple. No thyromegaly present.  Cardiovascular: Normal rate, regular rhythm and normal heart sounds. Exam reveals no gallop and no friction rub.  No murmur heard. Pulmonary/Chest: Effort normal and breath sounds normal. No stridor. No respiratory distress. He has no wheezes. He has no rales.  Abdominal: Soft. Bowel sounds are normal. He exhibits no distension. There is no tenderness. There is no rebound and no guarding.  Musculoskeletal: He exhibits no edema.  Mild  tenderness of the right hip, quarter size area of ecchymosis with mild tenderness; Full range of motion No midline cervical, thoracic, or lumbar tenderness; no tenderness to remaining extremities  Lymphadenopathy:    He has no cervical adenopathy.  Neurological: He is alert. Coordination normal.  Skin: Skin is warm and dry. No rash noted. He is not diaphoretic. No pallor.  Psychiatric: He has a normal mood and affect.  Nursing note and vitals reviewed.    ED Treatments / Results  Labs (all labs ordered are listed, but only abnormal results are displayed) Labs Reviewed - No data to display  EKG None  Radiology Ct Head Wo Contrast  Result Date: 02/05/2018 CLINICAL DATA:  [place] and struck head EXAM: CT HEAD WITHOUT CONTRAST CT CERVICAL SPINE WITHOUT CONTRAST TECHNIQUE: Multidetector CT imaging of the head and cervical spine was performed following the standard protocol without intravenous contrast. Multiplanar CT image reconstructions of the cervical spine were also generated. COMPARISON:  None. FINDINGS: CT HEAD FINDINGS Brain: There is no evidence for acute hemorrhage, hydrocephalus, mass lesion, or abnormal extra-axial fluid collection. No definite CT evidence for acute infarction. Diffuse loss of parenchymal volume is consistent with atrophy. Patchy low attenuation in the deep hemispheric and periventricular white matter is nonspecific, but likely reflects chronic microvascular ischemic demyelination. Vascular: No hyperdense vessel or unexpected calcification. Skull: No evidence for fracture. No worrisome lytic or sclerotic lesion. Sinuses/Orbits: The visualized paranasal sinuses and mastoid air cells are clear. Visualized portions of the globes and intraorbital fat are unremarkable. Other: None. CT CERVICAL SPINE FINDINGS Alignment: No subluxation. Slight accentuation normal cervical lordosis. Skull base and vertebrae: No acute fracture. No primary bone lesion or focal pathologic process. Soft  tissues and spinal canal: No prevertebral fluid or swelling. No visible canal hematoma. Disc levels: Loss of disc height and endplate degeneration noted at C5-6, C6-7, and C7-T1. Facet osteoarthritis is noted bilaterally. Upper chest: Negative. Other: None. IMPRESSION: 1. No acute intracranial abnormality. Atrophy with chronic small vessel white matter ischemic disease. 2. Degenerative changes in the cervical spine without evidence of fracture. Electronically Signed   By: Misty Stanley M.D.   On: 02/05/2018 19:43   Ct Cervical Spine  Wo Contrast  Result Date: 02/05/2018 CLINICAL DATA:  [place] and struck head EXAM: CT HEAD WITHOUT CONTRAST CT CERVICAL SPINE WITHOUT CONTRAST TECHNIQUE: Multidetector CT imaging of the head and cervical spine was performed following the standard protocol without intravenous contrast. Multiplanar CT image reconstructions of the cervical spine were also generated. COMPARISON:  None. FINDINGS: CT HEAD FINDINGS Brain: There is no evidence for acute hemorrhage, hydrocephalus, mass lesion, or abnormal extra-axial fluid collection. No definite CT evidence for acute infarction. Diffuse loss of parenchymal volume is consistent with atrophy. Patchy low attenuation in the deep hemispheric and periventricular white matter is nonspecific, but likely reflects chronic microvascular ischemic demyelination. Vascular: No hyperdense vessel or unexpected calcification. Skull: No evidence for fracture. No worrisome lytic or sclerotic lesion. Sinuses/Orbits: The visualized paranasal sinuses and mastoid air cells are clear. Visualized portions of the globes and intraorbital fat are unremarkable. Other: None. CT CERVICAL SPINE FINDINGS Alignment: No subluxation. Slight accentuation normal cervical lordosis. Skull base and vertebrae: No acute fracture. No primary bone lesion or focal pathologic process. Soft tissues and spinal canal: No prevertebral fluid or swelling. No visible canal hematoma. Disc levels:  Loss of disc height and endplate degeneration noted at C5-6, C6-7, and C7-T1. Facet osteoarthritis is noted bilaterally. Upper chest: Negative. Other: None. IMPRESSION: 1. No acute intracranial abnormality. Atrophy with chronic small vessel white matter ischemic disease. 2. Degenerative changes in the cervical spine without evidence of fracture. Electronically Signed   By: Misty Stanley M.D.   On: 02/05/2018 19:43   Dg Hip Unilat  With Pelvis 2-3 Views Right  Result Date: 02/05/2018 CLINICAL DATA:  Fall EXAM: DG HIP (WITH OR WITHOUT PELVIS) 2-3V RIGHT COMPARISON:  None. FINDINGS: No acute fracture. No dislocation.  Unremarkable soft tissues. IMPRESSION: No acute bony pathology. Electronically Signed   By: Marybelle Killings M.D.   On: 02/05/2018 18:27    Procedures .Marland KitchenLaceration Repair Date/Time: 02/05/2018 11:44 PM Performed by: Frederica Kuster, PA-C Authorized by: Frederica Kuster, PA-C   Consent:    Consent obtained:  Verbal   Consent given by:  Patient   Risks discussed:  Infection, pain and poor cosmetic result Anesthesia (see MAR for exact dosages):    Anesthesia method:  Local infiltration   Local anesthetic:  Lidocaine 1% w/o epi Laceration details:    Location:  Scalp   Scalp location:  Crown   Length (cm):  4   Depth (mm):  5 Repair type:    Repair type:  Simple Pre-procedure details:    Preparation:  Patient was prepped and draped in usual sterile fashion and imaging obtained to evaluate for foreign bodies Exploration:    Hemostasis achieved with:  Direct pressure   Wound exploration: wound explored through full range of motion and entire depth of wound probed and visualized     Wound extent: no foreign bodies/material noted     Contaminated: no   Treatment:    Area cleansed with:  Saline   Amount of cleaning:  Standard   Irrigation solution:  Sterile saline   Irrigation volume:  100   Irrigation method:  Syringe   Visualized foreign bodies/material removed: no   Skin  repair:    Repair method:  Sutures   Suture size:  5-0   Wound skin closure material used: Ethilon.   Suture technique:  Simple interrupted   Number of sutures:  7 Approximation:    Approximation:  Close Post-procedure details:    Dressing:  Antibiotic ointment and non-adherent dressing  Patient tolerance of procedure:  Tolerated well, no immediate complications   (including critical care time)  Medications Ordered in ED Medications  lidocaine (PF) (XYLOCAINE) 1 % injection 30 mL (has no administration in time range)     Initial Impression / Assessment and Plan / ED Course  I have reviewed the triage vital signs and the nursing notes.  Pertinent labs & imaging results that were available during my care of the patient were reviewed by me and considered in my medical decision making (see chart for details).     Patient with scalp laceration repaired with sutures by myself.  CT head and C-spine are negative for acute findings.  Right hip x-rays negative.  Patient is very well-appearing denies any other symptoms.  Tetanus is up-to-date.  Wound care discussed.  Return precautions discussed including signs of infection.  Follow-up to PCP or return in 7 to 10 days for suture removal.  Patient and wife understand and agree with plan.  Patient vitals stable throughout ED course and discharged in satisfactory condition.  Final Clinical Impressions(s) / ED Diagnoses   Final diagnoses:  Fall, initial encounter  Laceration of scalp, initial encounter    ED Discharge Orders    None       Frederica Kuster, PA-C 02/05/18 2345    Davonna Belling, MD 02/06/18 [phone]

## 2018-02-05 NOTE — ED Triage Notes (Addendum)
Foot caught on table and bench at a restaurant.  Patient fell to buttocks and hit head on cross bar under table on opposite side of walkway.  No loc.  Patient is on eliquis.

## 2018-02-05 NOTE — ED Notes (Signed)
Dr Meda Coffee reviewed record and instruct patient to be sent to ed.  Informed patient of the request and sent to ed.

## 2018-02-05 NOTE — Telephone Encounter (Signed)
Spoke with pt and reminded pt of remote transmission that is due today. Pt verbalized understanding.   

## 2018-02-05 NOTE — ED Triage Notes (Signed)
Pt presents with mechanical fall after lunch where his foot was lodged between pieces of furnature in the restaurant; pt fell onto R hip and struck head; pt denies LOC; pt on eliquis for Afib

## 2018-02-16 DIAGNOSIS — Z23 Encounter for immunization: Secondary | ICD-10-CM | POA: Diagnosis not present

## 2018-02-16 DIAGNOSIS — S0101XA Laceration without foreign body of scalp, initial encounter: Secondary | ICD-10-CM | POA: Diagnosis not present

## 2018-02-18 ENCOUNTER — Ambulatory Visit (INDEPENDENT_AMBULATORY_CARE_PROVIDER_SITE_OTHER): Payer: Medicare HMO | Admitting: *Deleted

## 2018-02-18 DIAGNOSIS — I442 Atrioventricular block, complete: Secondary | ICD-10-CM

## 2018-02-19 ENCOUNTER — Encounter: Payer: Self-pay | Admitting: Cardiology

## 2018-02-19 ENCOUNTER — Other Ambulatory Visit: Payer: Self-pay | Admitting: Internal Medicine

## 2018-02-19 NOTE — Progress Notes (Signed)
Remote pacemaker transmission.   

## 2018-02-19 NOTE — Telephone Encounter (Signed)
Pt last saw Dr Irish Lack 10/06/17, last labs 12/29/17 Creat 1.21, age 77, weight 78.5, based on specified criteria pt is on the appropriate dosage of Eliquis 5mg  BID. Will send refill.

## 2018-02-22 DIAGNOSIS — G4731 Primary central sleep apnea: Secondary | ICD-10-CM | POA: Diagnosis not present

## 2018-03-11 DIAGNOSIS — G4731 Primary central sleep apnea: Secondary | ICD-10-CM | POA: Diagnosis not present

## 2018-03-24 DIAGNOSIS — G4731 Primary central sleep apnea: Secondary | ICD-10-CM | POA: Diagnosis not present

## 2018-03-31 LAB — CUP PACEART REMOTE DEVICE CHECK
Battery Remaining Longevity: 27 mo
Battery Voltage: 2.8 V
Brady Statistic AS VP Percent: 3.5 %
Brady Statistic AS VS Percent: 1 %
Brady Statistic RA Percent Paced: 96 %
Implantable Lead Implant Date: 20121031
Implantable Lead Location: 753859
Implantable Lead Location: 753860
Lead Channel Impedance Value: 380 Ohm
Lead Channel Pacing Threshold Amplitude: 0.625 V
Lead Channel Pacing Threshold Pulse Width: 0.5 ms
Lead Channel Pacing Threshold Pulse Width: 0.5 ms
Lead Channel Sensing Intrinsic Amplitude: 12 mV
Lead Channel Setting Pacing Amplitude: 1.125
MDC IDC LEAD IMPLANT DT: 20121031
MDC IDC MSMT BATTERY REMAINING PERCENTAGE: 25 %
MDC IDC MSMT LEADCHNL RA SENSING INTR AMPL: 0.9 mV
MDC IDC MSMT LEADCHNL RV IMPEDANCE VALUE: 400 Ohm
MDC IDC MSMT LEADCHNL RV PACING THRESHOLD AMPLITUDE: 0.875 V
MDC IDC PG IMPLANT DT: 20121031
MDC IDC SESS DTM: 20190925174255
MDC IDC SET LEADCHNL RA PACING AMPLITUDE: 1.625
MDC IDC SET LEADCHNL RV PACING PULSEWIDTH: 0.5 ms
MDC IDC SET LEADCHNL RV SENSING SENSITIVITY: 4 mV
MDC IDC STAT BRADY AP VP PERCENT: 96 %
MDC IDC STAT BRADY AP VS PERCENT: 1 %
MDC IDC STAT BRADY RV PERCENT PACED: 99 %
Pulse Gen Model: 2210
Pulse Gen Serial Number: 7280216

## 2018-04-14 ENCOUNTER — Encounter: Payer: Self-pay | Admitting: Physician Assistant

## 2018-04-14 NOTE — Progress Notes (Signed)
Cardiology Office Note    Date:  04/16/2018  ID:  David [name], DOB 02/14/1941, MRN [id number] PCP:  David Manes, MD  Cardiologist:  Larae Grooms, MD   Chief Complaint: f/u CHF  History of Present Illness:  David [name] is a 77 y.o. male with history of CHB s/p pacemaker 2012, CAD (LAD atherectomy 1999), paroxysmal atrial fib (initially identified by pacemaker), mild-moderate AS, chronic diastolic CHF, ascending aortic and aortic root dilation, Gilbert's syndrome, HTN, peripheral neuropathy/RLS, HLD (managed by PCP), spinal stenosis, thrombocytopenia, DM, OSA, CKD III who presents for 6 month follow-up. He is originally from [place] but has lived in [place] for many years. Last ischemic assessment was in 2012 with PET nuclear test with moderately reduced blood flow but no indication for angiography [facility]). I met him in 06/2017 when he was being managed for acute on chronic diastolic CHF. 2D echo 07/08/17 showed EF 50-55%, grade 2 DD, mild-moderate AS, mildly dilated aortic root and moderately dilated ascending aorta, mildly elevated PASP 25mHg. Last labs 12/2017 showed A1C 6.7, Hgb 13.4, plt 140, BUN 27, Cr 1.21, K 4.6, Tbili 1.6, AST/ALT OK, albumin 4.0, LDL 65, trig 70, HDL 54. He was last seen by Dr. VIrish Lack5/2019 and felt to be doing well overall. He did not think TEE was indicated. PPM is followed by Dr. TLovena Le Last device check 01/2018 with <1% mode switches.  He returns for follow-up with this wife. [place] indiscretion. He is only taking the torsemide 1/2 tablet about 50% of the time. It was challenging to take while on vacation. His weight is up. He thinks some of this is body weight as well. He also reports increasing DOE as well as generalized fatigue. He also points out some slow healing scabs on his anterior shin for which he'd like to start an OTC stem cell stimulator supplement (details not yet known). No  claudication. He does have LEE. No chest pain. Wife has questions about being told by someone that he would no longer require in-office visits for PPM checks. No orthopnea or palpitations. No bleeding.  Past Medical History:  Diagnosis Date  . Aortic stenosis    a.mild-mod by echo 06/2017.  . Ascending aorta dilatation (HCC)    a. mildly dilated aortic root and mod dilated ascending aorta by echo 06/2017.  .Marland KitchenChronic diastolic CHF (congestive heart failure) ([place]   . CKD (chronic kidney disease), stage III ([place]   . Complete heart block (HCC)    a. s/p STJ pacemaker in 02/2011 - Dr. KCaryl Comes . Coronary artery disease    a. LAD atherectomy 1999.  . Gilbert's syndrome   . Hypertension   . Insomnia, unspecified   . Pacemaker-[place] 07/16/2011  . PAF (paroxysmal atrial fibrillation) ([place]    a. identified on pacemaker interrogation; on Eliquis.  . Peripheral neuropathy   . Pure hypercholesterolemia   . Restless leg syndrome   . Spermatocele    near left testicle 2 cm  . Spinal stenosis, lumbar   . Thrombocytopenia ([place]    Averaging 140,000 since 2008  . Tubular adenoma 09/2012   Dr. MWatt Climesrepeat 4 years  . Type II or unspecified type diabetes mellitus without mention of complication, not stated as uncontrolled   . Unspecified sleep apnea     Past Surgical History:  Procedure Laterality Date  . APPENDECTOMY  1957  . CHOLECYSTECTOMY  1988  . CORONARY STENT PLACEMENT    .  HEMORROIDECTOMY  1980  . HERNIA REPAIR Bilateral   . pacermaker  07/16/11   Dr. [name]  . TEE WITHOUT CARDIOVERSION N/A 06/14/2014   Procedure: TRANSESOPHAGEAL ECHOCARDIOGRAM (TEE);  Surgeon: [name], MD;  [facility] ENDOSCOPY;  Service: Cardiovascular;  Laterality: N/A;    Current Medications: Current Meds  Medication Sig  . acetaminophen (TYLENOL) 500 MG tablet Take 500 mg by mouth every 6 (six) hours as needed for mild pain.  [name] apixaban (ELIQUIS) 5 MG TABS tablet Take 5 mg by mouth 2 (two) times daily.    . cholecalciferol (VITAMIN D) 1000 UNITS tablet Take 1,000 Units by mouth daily.  . Coenzyme Q10 (COQ10) 100 MG CAPS Take 100 mg by mouth daily.   . Cyanocobalamin (VITAMIN B-12) 2500 MCG SUBL Place 1 tablet under the tongue daily.  . fluticasone (FLONASE) 50 MCG/ACT nasal spray Place 1 spray into both nostrils daily as needed for allergies or rhinitis.   [name] loratadine (CLARITIN) 10 MG tablet Take 10 mg by mouth daily as needed for allergies.  . metoprolol tartrate (LOPRESSOR) 50 MG tablet Take 0.5 tablets (25 mg total) by mouth 2 (two) times daily.  . Multiple Vitamin (MULTIVITAMIN) capsule TAKE 1 CAPSULE BY MOUTH THREE TIMES A WEEK  . nitroGLYCERIN (NITROSTAT) 0.4 MG SL tablet Place 1 tablet (0.4 mg total) under the tongue every 5 (five) minutes as needed. For chest pain  . Omega-3 Fatty Acids (FISH OIL PO) Take 1,000 Units by mouth daily. 1 capsule with a meal  . potassium chloride SA (K-DUR,KLOR-CON) 20 MEQ tablet Take 5 mEq by mouth daily as needed (take with Torsemide for fluid.).   [name] pramipexole (MIRAPEX) 1 MG tablet Take 2 mg by mouth at bedtime.    . simvastatin (ZOCOR) 10 MG tablet Take 10 mg by mouth daily.    [name] torsemide (DEMADEX) 20 MG tablet Take 10 mg by mouth daily as needed (fluid).   . vitamin C (ASCORBIC ACID) 500 MG tablet Take 500 mg by mouth daily.      Allergies:   Benadryl [diphenhydramine hcl]; Antihistamines, chlorpheniramine-type; and Restoril [temazepam]   Social History   Socioeconomic History  . Marital status: Married    Spouse name: Not on file  . Number of children: Not on file  . Years of education: Not on file  . Highest education level: Not on file  Occupational History  . Not on file  Social Needs  . Financial resource strain: Not on file  . Food insecurity:    Worry: Not on file    Inability: Not on file  . Transportation needs:    Medical: Not on file    Non-medical: Not on file  Tobacco Use  . Smoking status: Former Research scientist (life sciences)  . Smokeless  tobacco: Never Used  Substance and Sexual Activity  . Alcohol use: Yes    Comment: occasional glass of wine  . Drug use: No  . Sexual activity: Not on file  Lifestyle  . Physical activity:    Days per week: Not on file    Minutes per session: Not on file  . Stress: Not on file  Relationships  . Social connections:    Talks on phone: Not on file    Gets together: Not on file    Attends religious service: Not on file    Active member of club or organization: Not on file    Attends meetings of clubs or organizations: Not on file    Relationship status: Not on  file  Other Topics Concern  . Not on file  Social History Narrative  . Not on file     Family History:  The patient's family history includes CAD in his father; Cancer in his mother; Diabetes in his father; Heart attack in his father; Heart disease in his father; Hypertension in his father and sister.  ROS:   Please see the history of present illness.  + cold intolerance All other systems are reviewed and otherwise negative.    PHYSICAL EXAM:   VS:  BP 106/66   Pulse 60   Ht 6' (1.829 m)   Wt 184 lb (83.5 kg)   BMI 24.95 kg/m   BMI: Body mass index is 24.95 kg/m. Pulse ox 97% on RA GEN: Well nourished, well developed WM, pale in no acute distress HEENT: normocephalic, atraumatic Neck: no JVD, carotid bruits, or masses Cardiac: RRR; soft SEM at RUSB. No rubs or gallops, 1+ BLE edema  Respiratory:  clear to auscultation bilaterally, normal work of breathing GI: soft, nontender, nondistended, + BS MS: no deformity or atrophy Skin: warm and dry, no rash Neuro:  Alert and Oriented x 3, Strength and sensation are intact, follows commands Psych: euthymic mood, full affect  Wt Readings from Last 3 Encounters:  04/16/18 184 lb (83.5 kg)  02/05/18 170 lb (77.1 kg)  12/31/17 171 lb (77.6 kg)      Studies/Labs Reviewed:   EKG:  EKG was ordered today and personally reviewed by me and demonstrates AV paced rhythm with  RBBB morphology similar to prior.   Recent Labs: 06/26/2017: NT-Pro BNP 4,725 07/04/2017: Hemoglobin 14.1; Platelets 149 07/17/2017: BUN 39; Creatinine, Ser 1.62; Potassium 5.2; Sodium 140   Lipid Panel    Component Value Date/Time   CHOL 119 (L) 05/04/2015 0818   TRIG 62 05/04/2015 0818   HDL 58 05/04/2015 0818   CHOLHDL 2.1 05/04/2015 0818   VLDL 12 05/04/2015 0818   LDLCALC 49 05/04/2015 0818    Additional studies/ records that were reviewed today include: Summarized above.   ASSESSMENT & PLAN:   1. Shortness of breath - I suspect this is due to #2. He is also reporting generalized fatigue and cold intolerance so will check labs first to exclude anemia or other acute metabolic abnormality. Check BNP to help guide plan for diuresis, keeping in mind baseline BP does not afford an opportunity for aggressive changes. If labs are unrevealing and symptoms persist, will consider repeating echocardiogram as he did have mild-mod AS on echo earlier this year. However, murmur is not as pronounced as I would expect if this has significantly progressed. Updated ischemic eval may need to be considered if above management proves unfruitful. 2. Acute on chronic diastolic CHF - check labs as above. Reviewed 2g sodium restriction, 2L fluid restriction, daily weights with patient. Reiterated importance. 3. Mild-moderate AS - will consider echo contingent on what labs show. 4. CAD - no recent CP. Follow workup as above. He is taking a hiatus from his statin due to fear of side effects. He has communicated this to PCP who follows his cholesterol. 5. Mildly dilated aortic root and moderately dilated ascending aorta - he will be due for repeat echo 06/2018 although we may pursue sooner depending on how things go for #1. 6. Paroysmal atrial fib - maintaining NSR. Continue Eliquis. Discussed dose with patient. 7. Cold intolerant - check TSH. 8. Wound on lower extremity (R), slow healing - he will call in with  ingredients on stem  cell supplement. I did tell him in general I am not a fan of unregulated supplements due to potentially dangerous interactions with cardiac meds. We will undertake vascular testing to exclude PAD as this is more likely to be helpful than an OTC med.  Disposition: F/u with Dr. Roney Marion team APP in 1-2 weeks. I asked nurse to reach out to device team to help clarify this question of not requiring in-person EP follow-up in the future. I have not heard that before from our office.  Medication Adjustments/Labs and Tests Ordered: Current medicines are reviewed at length with the patient today.  Concerns regarding medicines are outlined above. Medication changes, Labs and Tests ordered today are summarized above and listed in the Patient Instructions accessible in Encounters.   Signed, Charlie Pitter, PA-C  04/16/2018 1:57 PM    [facility] [address], [place], [place]  [address] Phone: [phone]; Fax: [phone]

## 2018-04-16 ENCOUNTER — Encounter: Payer: Self-pay | Admitting: Physician Assistant

## 2018-04-16 ENCOUNTER — Ambulatory Visit: Payer: Medicare HMO | Admitting: Physician Assistant

## 2018-04-16 VITALS — BP 106/66 | HR 60 | Ht 72.0 in | Wt 184.0 lb

## 2018-04-16 DIAGNOSIS — I77810 Thoracic aortic ectasia: Secondary | ICD-10-CM

## 2018-04-16 DIAGNOSIS — R0600 Dyspnea, unspecified: Secondary | ICD-10-CM

## 2018-04-16 DIAGNOSIS — R6889 Other general symptoms and signs: Secondary | ICD-10-CM | POA: Diagnosis not present

## 2018-04-16 DIAGNOSIS — I350 Nonrheumatic aortic (valve) stenosis: Secondary | ICD-10-CM

## 2018-04-16 DIAGNOSIS — R0609 Other forms of dyspnea: Secondary | ICD-10-CM | POA: Diagnosis not present

## 2018-04-16 DIAGNOSIS — I480 Paroxysmal atrial fibrillation: Secondary | ICD-10-CM | POA: Diagnosis not present

## 2018-04-16 DIAGNOSIS — S81801A Unspecified open wound, right lower leg, initial encounter: Secondary | ICD-10-CM | POA: Diagnosis not present

## 2018-04-16 DIAGNOSIS — I5033 Acute on chronic diastolic (congestive) heart failure: Secondary | ICD-10-CM

## 2018-04-16 DIAGNOSIS — I2510 Atherosclerotic heart disease of native coronary artery without angina pectoris: Secondary | ICD-10-CM

## 2018-04-16 NOTE — Patient Instructions (Addendum)
Medication Instructions:  1. Your physician recommends that you continue on your current medications as directed. Please refer to the Current Medication list given to you today.  If you need a refill on your cardiac medications before your next appointment, please call your pharmacy.   Lab work: TODAY BMET, CBC, TSH, PRO BNP If you have labs (blood work) drawn today and your tests are completely normal, you will receive your results only by: [name] MyChart Message (if you have MyChart) OR . A paper copy in the mail If you have any lab test that is abnormal or we need to change your treatment, we will call you to review the results.  Testing/Procedures: Your physician has requested that you have a lower extremity arterial exercise duplex. During this test, exercise and ultrasound are used to evaluate arterial blood flow in the legs. Allow one hour for this exam. There are no restrictions or special instructions. THIS IS TO BE DONE WITH ABI'S   Follow-Up: At [facility], you and your health needs are our priority.  As part of our continuing mission to provide you with exceptional heart care, we have created designated Provider Care Teams.  These Care Teams include your primary Cardiologist (physician) and Advanced Practice Providers (APPs -  Physician Assistants and Nurse Practitioners) who all work together to provide you with the care you need, when you need it. You will need a follow up appointment in 2 weeks.  You may see Larae Grooms, MD or one of the following Advanced Practice Providers on your designated Care Team:   [place], PA-C Melina Copa, PA-C . Ermalinda Barrios, PA-C  Any Other Special Instructions Will Be Listed Below (If Applicable).                   For patients with congestive heart failure, we give them these special instructions:  1. Follow a low-salt diet - you are allowed no more than 2,000mg  of sodium per day. Watch your fluid intake. In  general, you should not be taking in more than 2 liters of fluid per day (no more than 8 glasses per day). This includes sources of water in foods like soup, coffee, tea, milk, etc. 2. Weigh yourself on the same scale at same time of day and keep a log. 3. Call your doctor: (Anytime you feel any of the following symptoms)  - 3lb weight gain overnight or 5lb within a few days - Shortness of breath, with or without a dry hacking cough  - Swelling in the hands, feet or stomach  - If you have to sleep on extra pillows at night in order to breathe   IT IS IMPORTANT TO LET YOUR DOCTOR KNOW EARLY ON IF YOU ARE HAVING SYMPTOMS SO WE CAN HELP YOU!

## 2018-04-17 ENCOUNTER — Telehealth: Payer: Self-pay | Admitting: *Deleted

## 2018-04-17 ENCOUNTER — Other Ambulatory Visit: Payer: Self-pay | Admitting: Physician Assistant

## 2018-04-17 DIAGNOSIS — N183 Chronic kidney disease, stage 3 unspecified: Secondary | ICD-10-CM

## 2018-04-17 DIAGNOSIS — S81801A Unspecified open wound, right lower leg, initial encounter: Secondary | ICD-10-CM

## 2018-04-17 DIAGNOSIS — I5033 Acute on chronic diastolic (congestive) heart failure: Secondary | ICD-10-CM

## 2018-04-17 DIAGNOSIS — D509 Iron deficiency anemia, unspecified: Secondary | ICD-10-CM

## 2018-04-17 LAB — CBC
HEMATOCRIT: 37 % — AB (ref 37.5–51.0)
Hemoglobin: 12.6 g/dL — ABNORMAL LOW (ref 13.0–17.7)
MCH: 32.6 pg (ref 26.6–33.0)
MCHC: 34.1 g/dL (ref 31.5–35.7)
MCV: 96 fL (ref 79–97)
Platelets: 155 10*3/uL (ref 150–450)
RBC: 3.87 x10E6/uL — ABNORMAL LOW (ref 4.14–5.80)
RDW: 12.3 % (ref 12.3–15.4)
WBC: 7.8 10*3/uL (ref 3.4–10.8)

## 2018-04-17 LAB — BASIC METABOLIC PANEL
BUN / CREAT RATIO: 25 — AB (ref 10–24)
BUN: 35 mg/dL — ABNORMAL HIGH (ref 8–27)
CHLORIDE: 105 mmol/L (ref 96–106)
CO2: 24 mmol/L (ref 20–29)
Calcium: 9.4 mg/dL (ref 8.6–10.2)
Creatinine, Ser: 1.38 mg/dL — ABNORMAL HIGH (ref 0.76–1.27)
GFR calc Af Amer: 56 mL/min/{1.73_m2} — ABNORMAL LOW (ref >59)
GFR calc non Af Amer: 48 mL/min/{1.73_m2} — ABNORMAL LOW (ref >59)
GLUCOSE: 102 mg/dL — AB (ref 65–99)
Potassium: 4.8 mmol/L (ref 3.5–5.2)
SODIUM: 143 mmol/L (ref 134–144)

## 2018-04-17 LAB — TSH: TSH: 3.01 u[IU]/mL (ref 0.450–4.500)

## 2018-04-17 LAB — PRO B NATRIURETIC PEPTIDE: NT-Pro BNP: 4757 pg/mL — ABNORMAL HIGH (ref 0–486)

## 2018-04-17 MED ORDER — TORSEMIDE 20 MG PO TABS: 20.0000 mg | ORAL_TABLET | Freq: Every day | ORAL | 3 refills | Status: DC

## 2018-04-17 NOTE — Telephone Encounter (Signed)
Left message to go over lab results and recommendations. I d/w Melina Copa, PA today about repeat labs and we will also add on a CBC to the BMET/PRO BNP.

## 2018-04-17 NOTE — Telephone Encounter (Signed)
-----   Message from Charlie Pitter, [place] sent at 04/17/2018  1:05 PM EST ----- Please call Mr Mandigo. Seen in clinic yesterday for DOE. BNP is consistent with recurrent heart failure so I think this is causing his symptoms.   He has only taken torsemide 10mg  daily about 50% of the time - was on PRN basis. Would recommend to increase to 20mg  daily. Limit fluid intake to 64 oz max. Limit sodium strictly to 2000mg  daily. If he increases diuretic for several days and feels like this does the trick and gets back to normal, he would have my permission to return to lower 10mg  daily dose. Needs BMET/BNP and nurse BP check in approximately 5 days. If no nurse visits available, please make APP appt at that time instead.  He asked about an OTC stem cell stimulator med (?). I would hold off on any new stem cell supplements until we get this under control - do not want to confuse the picture.  He asked about eliquis dose in clinic - currently remains appropriate but need to follow kidney function and when he turns 78 this needs to be looked at again as well.  Blood count is slightly decreased but similar to 05/2017, need to keep a close eye on this and monitor for any bleeding.  Dayna Dunn PA-C

## 2018-04-17 NOTE — Telephone Encounter (Signed)
Pt has been notified of lab results and recommendations. Pt will increase Torsemide to 20 mg daily. Pt advised ok per Melina Copa, PA if he feels better and weight is back to normal after a few days then ok to decrease Torsemide to 10 mg daily. Pt is agreeable to lab work and BP to be done 04/27/18 @ 11 am. I went over the recommendations per PA about the Stem Cell Stimulator Med, as well about Eliquis dose and recommendation we will take another look at Eliquis after he has turned 78 06/01/18. Labs 12/2 bmet, cbc, pro bnp, nurse visit 12/2 for BP check. Pt is agreeable to plan of care and thanked me for the call.

## 2018-04-17 NOTE — Telephone Encounter (Signed)
-----   Message from Charlie Pitter, [place] sent at 04/17/2018  1:05 PM EST ----- Please call Mr Okray. Seen in clinic yesterday for DOE. BNP is consistent with recurrent heart failure so I think this is causing his symptoms.   He has only taken torsemide 10mg  daily about 50% of the time - was on PRN basis. Would recommend to increase to 20mg  daily. Limit fluid intake to 64 oz max. Limit sodium strictly to 2000mg  daily. If he increases diuretic for several days and feels like this does the trick and gets back to normal, he would have my permission to return to lower 10mg  daily dose. Needs BMET/BNP and nurse BP check in approximately 5 days. If no nurse visits available, please make APP appt at that time instead.  He asked about an OTC stem cell stimulator med (?). I would hold off on any new stem cell supplements until we get this under control - do not want to confuse the picture.  He asked about eliquis dose in clinic - currently remains appropriate but need to follow kidney function and when he turns 78 this needs to be looked at again as well.  Blood count is slightly decreased but similar to 05/2017, need to keep a close eye on this and monitor for any bleeding.  Dayna Dunn PA-C

## 2018-04-24 DIAGNOSIS — G4731 Primary central sleep apnea: Secondary | ICD-10-CM | POA: Diagnosis not present

## 2018-04-27 ENCOUNTER — Ambulatory Visit: Payer: Medicare HMO

## 2018-04-27 ENCOUNTER — Other Ambulatory Visit: Payer: Medicare HMO | Admitting: *Deleted

## 2018-04-27 DIAGNOSIS — N183 Chronic kidney disease, stage 3 unspecified: Secondary | ICD-10-CM

## 2018-04-27 DIAGNOSIS — I5033 Acute on chronic diastolic (congestive) heart failure: Secondary | ICD-10-CM

## 2018-04-27 DIAGNOSIS — D509 Iron deficiency anemia, unspecified: Secondary | ICD-10-CM

## 2018-04-27 LAB — CBC
HEMATOCRIT: 38.2 % (ref 37.5–51.0)
HEMOGLOBIN: 12.9 g/dL — AB (ref 13.0–17.7)
MCH: 32.1 pg (ref 26.6–33.0)
MCHC: 33.8 g/dL (ref 31.5–35.7)
MCV: 95 fL (ref 79–97)
Platelets: 146 10*3/uL — ABNORMAL LOW (ref 150–450)
RBC: 4.02 x10E6/uL — AB (ref 4.14–5.80)
RDW: 12 % — ABNORMAL LOW (ref 12.3–15.4)
WBC: 7 10*3/uL (ref 3.4–10.8)

## 2018-04-27 LAB — BASIC METABOLIC PANEL
BUN/Creatinine Ratio: 20 (ref 10–24)
BUN: 34 mg/dL — AB (ref 8–27)
CALCIUM: 9.8 mg/dL (ref 8.6–10.2)
CO2: 26 mmol/L (ref 20–29)
CREATININE: 1.71 mg/dL — AB (ref 0.76–1.27)
Chloride: 100 mmol/L (ref 96–106)
GFR, EST AFRICAN AMERICAN: 43 mL/min/{1.73_m2} — AB (ref >59)
GFR, EST NON AFRICAN AMERICAN: 37 mL/min/{1.73_m2} — AB (ref >59)
Glucose: 76 mg/dL (ref 65–99)
Potassium: 5.1 mmol/L (ref 3.5–5.2)
Sodium: 138 mmol/L (ref 134–144)

## 2018-04-27 LAB — PRO B NATRIURETIC PEPTIDE: NT-PRO BNP: 4001 pg/mL — AB (ref 0–486)

## 2018-04-28 ENCOUNTER — Other Ambulatory Visit: Payer: Self-pay | Admitting: Physician Assistant

## 2018-04-28 ENCOUNTER — Ambulatory Visit (HOSPITAL_COMMUNITY)
Admission: RE | Admit: 2018-04-28 | Discharge: 2018-04-28 | Disposition: A | Discharge status: Home / Self Care | Payer: Medicare HMO | Source: Ambulatory Visit | Attending: Cardiovascular Disease | Admitting: Cardiovascular Disease

## 2018-04-28 DIAGNOSIS — S81801A Unspecified open wound, right lower leg, initial encounter: Secondary | ICD-10-CM

## 2018-04-29 NOTE — Progress Notes (Addendum)
Cardiology Office Note    Date:  04/30/2018  ID:  David [name], DOB 1941/02/10, MRN [id number] PCP:  David Manes, MD  Cardiologist:  David Grooms, MD   Chief Complaint: f/u CHF  History of Present Illness:  David [name] is a 77 y.o. male with history of CHB s/p pacemaker 2012, CAD (LAD atherectomy 1999), paroxysmal atrial fib (initially identified by pacemaker), mild-moderate AS, chronic diastolic CHF, ascending aortic and aortic root dilation, Gilbert's syndrome, HTN, peripheral neuropathy/RLS, HLD (managed by PCP), spinal stenosis, thrombocytopenia, DM, OSA, CKD III who presents for CHF follow-up.  Last ischemic assessment was in 2012 with PET nuclear test with moderately reduced blood flow but no indication for angiography [facility]). He is originally from [place] but has lived in [place] for many years. I met him in 06/2017 when he was being managed for acute on chronic diastolic CHF in the setting of increased frequency of eating out while traveling. 2D echo 07/08/17 showed EF 50-55%, grade 2 DD, mild-moderate AS, mildly dilated aortic root and moderately dilated ascending aorta, mildly elevated PASP 74mHg - study recommended to consider TEE if clinically indicated. He was last seen by Dr. VIrish Lack5/2019 and felt to be doing well at 173lb. Last PPM device check 01/2018 showed <1% mode switches. He presented back in follow-up 04/16/18 with worsening DOE, weight gain and swelling. [place] and skipping diuretic periodically. He was previously given flexibility to self-adjust his torsemide so was reported to be taking 1/2 tablet (167m about 50% of the time. He believed some of his weight gain was body weight as well. His last visit was prolonged as he had several questions that needed addressing. He inquired about starting an unregulated OTC stem cell supplement which I advised against given potential  interaction with his cardiac meds. He also requested eval of slow healing scabs on his anterior shin and clarification for the plan for his PPM checks as he thought he'd been told he would not have to come into the office anymore to have that followed. We had asked device clinic to reach out to clarify.  His torsemide was increased to 2079maily with repeat labs 04/27/18 showing Cr 1.71, K 5.1, Hgb 12.9, BNP 4,001. Vascular studies 12/3 showed normal ABIs but PPG tracings appeared dampened. Labs 04/16/18 showed BNP 4,757, Hgb 12.6, TSH wnl, Cr 1.38 (with previous value around 1.6).  He presents back for follow-up. Visit was made challenging by difficulty understanding his diuretic dosing. Mr. StaOverleyought in a piece of paper logging his daily diuretic dose. Some days he took 1 whole tablet and other days he increased it to 2. He states he thought that's what he was prescribed, but we only had advised 1 tablet daily on the premise that he had been taking 1/2 tablet daily per report last visit. He brings in two torsemide bottles with separate sigs. He thinks perhaps he self-adjusted upward based on weight but isn't sure. He also was now unsure that he was ever taking 1/2 tablet of the 66m51m previously indicated in last month's visit - he thinks perhaps it had been 1 whole tablet a few times a week. His weight got down to 170 at one point but went back up after Thanksgiving. He still remains down about 8lb from last OV. Some days he elected to skip a dose altogether because of the inconvenience. He took a 3rd tablet yesterday because he thought  it was a good idea priort o his visit today. He has still been taking potassium. This was discontinued per 12/2 result note but do not see it was formally conveyed to the patient. Mr. Capistran reports generalized improvement in swelling and breathing but is not really able to answer the question as to whether he's back to baseline. He has seen a slow decline over the last year  so isn't really sure. He was able to go to senior aerobics without complication this last week but does note DOE walking up hills in the last few months. No CP. No claudication.   Past Medical History:  Diagnosis Date  . Aortic stenosis    a.mild-mod by echo 06/2017.  . Ascending aorta dilatation (HCC)    a. mildly dilated aortic root and mod dilated ascending aorta by echo 06/2017.  David [name] Chronic diastolic CHF (congestive heart failure) ([place])   . CKD (chronic kidney disease), stage III ([place])   . Complete heart block (HCC)    a. s/p STJ pacemaker in 02/2011 - Dr. Caryl [name]  . Coronary artery disease    a. LAD atherectomy 1999.  . Gilbert's syndrome   . Hypertension   . Insomnia, unspecified   . Pacemaker-[employer] [facility] 07/16/2011  . PAF (paroxysmal atrial fibrillation) ([place])    a. identified on pacemaker interrogation; on Eliquis.  . Peripheral neuropathy   . Pure hypercholesterolemia   . Restless leg syndrome   . Spermatocele    near left testicle 2 cm  . Spinal stenosis, lumbar   . Thrombocytopenia ([place])    Averaging 140,000 since 2008  . Tubular adenoma 09/2012   Dr. Watt Climes repeat 4 years  . Type II or unspecified type diabetes mellitus without mention of complication, not stated as uncontrolled   . Unspecified sleep apnea     Past Surgical History:  Procedure Laterality Date  . APPENDECTOMY  1957  . CHOLECYSTECTOMY  1988  . CORONARY STENT PLACEMENT    . HEMORROIDECTOMY  1980  . HERNIA REPAIR Bilateral   . pacermaker  07/16/11   Dr. Caryl [name]  . TEE WITHOUT CARDIOVERSION N/A 06/14/2014   Procedure: TRANSESOPHAGEAL ECHOCARDIOGRAM (TEE);  Surgeon: David Furbish, MD;  Location[facility] ENDOSCOPY;  Service: Cardiovascular;  Laterality: N/A;    Current Medications: Current Meds  Medication Sig  . acetaminophen (TYLENOL) 500 MG tablet Take 500 mg by mouth every 6 (six) hours as needed for mild pain.  David [name] apixaban (ELIQUIS) 5 MG TABS tablet Take 5 mg by mouth 2 (two) times daily.  . cholecalciferol  (VITAMIN D) 1000 UNITS tablet Take 1,000 Units by mouth daily.  . Coenzyme Q10 (COQ10) 100 MG CAPS Take 100 mg by mouth daily.   . Cyanocobalamin (VITAMIN B-12) 2500 MCG SUBL Place 1 tablet under the tongue daily.  . fluticasone (FLONASE) 50 MCG/ACT nasal spray Place 1 spray into both nostrils daily as needed for allergies or rhinitis.   David [name] loratadine (CLARITIN) 10 MG tablet Take 10 mg by mouth daily as needed for allergies.  . metoprolol tartrate (LOPRESSOR) 50 MG tablet Take 0.5 tablets (25 mg total) by mouth 2 (two) times daily.  . Multiple Vitamin (MULTIVITAMIN) capsule TAKE 1 CAPSULE BY MOUTH THREE TIMES A WEEK  . nitroGLYCERIN (NITROSTAT) 0.4 MG SL tablet Place 1 tablet (0.4 mg total) under the tongue every 5 (five) minutes as needed. For chest pain  . Omega-3 Fatty Acids (FISH OIL PO) Take 1,000 Units by mouth daily. 1 capsule with a meal  . potassium  chloride SA (K-DUR,KLOR-CON) 20 MEQ tablet Take 10 mEq by mouth daily as needed (take with Torsemide for fluid.).   David [name] pramipexole (MIRAPEX) 1 MG tablet Take 2 mg by mouth at bedtime.    . simvastatin (ZOCOR) 10 MG tablet Take 10 mg by mouth daily.    David [name] torsemide (DEMADEX) 20 MG tablet Take 1 tablet (20 mg total) by mouth daily.  . vitamin C (ASCORBIC ACID) 500 MG tablet Take 500 mg by mouth daily.    Allergies:   Benadryl [diphenhydramine hcl]; Antihistamines, chlorpheniramine-type; and Restoril [temazepam]   Social History   Socioeconomic History  . Marital status: Married    Spouse name: Not on file  . Number of children: Not on file  . Years of education: Not on file  . Highest education level: Not on file  Occupational History  . Not on file  Social Needs  . Financial resource strain: Not on file  . Food insecurity:    Worry: Not on file    Inability: Not on file  . Transportation needs:    Medical: Not on file    Non-medical: Not on file  Tobacco Use  . Smoking status: Former Research scientist (life sciences)  . Smokeless tobacco: Never Used    Substance and Sexual Activity  . Alcohol use: Yes    Comment: occasional glass of wine  . Drug use: No  . Sexual activity: Not on file  Lifestyle  . Physical activity:    Days per week: Not on file    Minutes per session: Not on file  . Stress: Not on file  Relationships  . Social connections:    Talks on phone: Not on file    Gets together: Not on file    Attends religious service: Not on file    Active member of club or organization: Not on file    Attends meetings of clubs or organizations: Not on file    Relationship status: Not on file  Other Topics Concern  . Not on file  Social History Narrative  . Not on file     Family History:  The patient's family history includes CAD in his father; Cancer in his mother; Diabetes in his father; Heart attack in his father; Heart disease in his father; Hypertension in his father and sister.  ROS:   Please see the history of present illness.   All other systems are reviewed and otherwise negative.    PHYSICAL EXAM:   VS:  BP 114/62   Pulse 60   Ht 6' (1.829 m)   Wt 176 lb (79.8 kg)   SpO2 97%   BMI 23.87 kg/m   BMI: Body mass index is 23.87 kg/m. GEN: Well nourished, well developed WM, in no acute distress HEENT: normocephalic, atraumatic Neck: no JVD, carotid bruits, or masses Cardiac: RRR; no murmurs, rubs, or gallops, trace BLE edema  Respiratory:  clear to auscultation bilaterally, normal work of breathing GI: soft, nontender, nondistended, + BS MS: no deformity or atrophy Skin: warm and dry, no rash. Prior skin scab appears improved but there are nearby excoriations where patient has been itching.  Neuro:  Alert and Oriented x 3, Strength and sensation are intact, follows commands Psych: euthymic mood, full affect  Wt Readings from Last 3 Encounters:  04/30/18 176 lb (79.8 kg)  04/16/18 184 lb (83.5 kg)  02/05/18 170 lb (77.1 kg)      Studies/Labs Reviewed:   EKG:  EKG was not ordered today  Recent  Labs:  04/16/2018: TSH 3.010 04/27/2018: BUN 34; Creatinine, Ser 1.71; Hemoglobin 12.9; NT-Pro BNP 4,001; Platelets 146; Potassium 5.1; Sodium 138   Lipid Panel    Component Value Date/Time   CHOL 119 (L) 05/04/2015 0818   TRIG 62 05/04/2015 0818   HDL 58 05/04/2015 0818   CHOLHDL 2.1 05/04/2015 0818   VLDL 12 05/04/2015 0818   LDLCALC 49 05/04/2015 0818    Additional studies/ records that were reviewed today include: Summarized above.   ASSESSMENT & PLAN:   1. Acute on chronic diastolic CHF - clinically improved but BNP was still markedly elevated a few days ago. This may be related in part to his renal dysfunction. His actual diuretic dose was challenging to sort out today. He has lost 8lb of fluid since last OV. His weight is still about 3lb up compared to 09/2017 OV when felt to be doing well. He does feel like he gained body weight as well during his eating adventures while traveling. He has trace BLE that appears improved from prior. Lung sounds are not acutely crackly. He's been taking torsemide 2 tablets once a day for several days, occasionally skipping doses, but also took a 3rd dose yesterday because he figured he should in prep for his visit today. We discussed importance of only taking medication as prescribed from now on, to avoid med confusion. Will have him take torsemide 26m BID for now, with dose in early AM and early afternoon, no later than 2pm. Discontinue KCl supplement. Continue to sodium and fluid restrict. Will f/u BMET today to trend creatinine. Will update echo given decreased stamina. 2. Dyspnea on exertion - see above. Will allow echo to guide whether we need to update ischemic testing. 3. Aortic stenosis - update echo. 4. CAD -  Continue BB, statin for now. There could be contribution of his coronary disease with his dyspnea but will start first with noninvasive echo. Statin managed by primary care. He has been in talks with them about wanting to stop, but I will  defer this discussion between them. 5. Mildly dilated aortic root and moderately dilated ascending aorta - given CKD will avoid CTA for now, but will eval with updated echo as above. 6. Paroysmal atrial fib - maintaining NSR clinically. EKG last OV was atrial paced. Continue Eliquis. Need to follow renal function carefully as he is teetering on the 2.5/573mdose as of January when he has his birthday. 7. Wound on lower etremity - this appears to be a skin scab that he continues to scratch at. Discussed avoidance of provocation of scratching. LE duplex showed normal ABIs but PPG appeared dampened. Waveforms were all triphasic. In absence of claudication, we will follow conservatively for now.  Disposition: F/u with Dr. VaIrish Lackn 3 months.  Medication Adjustments/Labs and Tests Ordered: Current medicines are reviewed at length with the patient today.  Concerns regarding medicines are outlined above. Medication changes, Labs and Tests ordered today are summarized above and listed in the Patient Instructions accessible in Encounters.   Signed, DaCharlie PitterPA-C  04/30/2018 11:58 AM    [facility] [address]  [id number]: [phone]: [phone]

## 2018-04-30 ENCOUNTER — Ambulatory Visit: Payer: Medicare HMO | Admitting: Physician Assistant

## 2018-04-30 ENCOUNTER — Encounter: Payer: Self-pay | Admitting: Physician Assistant

## 2018-04-30 VITALS — BP 114/62 | HR 60 | Ht 72.0 in | Wt 176.0 lb

## 2018-04-30 DIAGNOSIS — I480 Paroxysmal atrial fibrillation: Secondary | ICD-10-CM | POA: Diagnosis not present

## 2018-04-30 DIAGNOSIS — I2510 Atherosclerotic heart disease of native coronary artery without angina pectoris: Secondary | ICD-10-CM | POA: Diagnosis not present

## 2018-04-30 DIAGNOSIS — R0600 Dyspnea, unspecified: Secondary | ICD-10-CM

## 2018-04-30 DIAGNOSIS — I5033 Acute on chronic diastolic (congestive) heart failure: Secondary | ICD-10-CM

## 2018-04-30 DIAGNOSIS — S81801D Unspecified open wound, right lower leg, subsequent encounter: Secondary | ICD-10-CM

## 2018-04-30 DIAGNOSIS — I350 Nonrheumatic aortic (valve) stenosis: Secondary | ICD-10-CM | POA: Diagnosis not present

## 2018-04-30 DIAGNOSIS — R0609 Other forms of dyspnea: Secondary | ICD-10-CM

## 2018-04-30 DIAGNOSIS — I77810 Thoracic aortic ectasia: Secondary | ICD-10-CM | POA: Diagnosis not present

## 2018-04-30 MED ORDER — TORSEMIDE 20 MG PO TABS: 20.0000 mg | ORAL_TABLET | Freq: Two times a day (BID) | ORAL | 3 refills | Status: DC

## 2018-04-30 NOTE — Patient Instructions (Signed)
Medication Instructions:  Your physician has recommended you make the following change in your medication:  1.  STOP the Potassium 2.  INCREASE the Torsemide to twice daily , taking no later than 2 pm   If you need a refill on your cardiac medications before your next appointment, please call your pharmacy.   Lab work: TODAY:  BMET  If you have labs (blood work) drawn today and your tests are completely normal, you will receive your results only by: [name] MyChart Message (if you have MyChart) OR . A paper copy in the mail If you have any lab test that is abnormal or we need to change your treatment, we will call you to review the results.  Testing/Procedures: Your physician has requested that you have an echocardiogram. Echocardiography is a painless test that uses sound waves to create images of your heart. It provides your doctor with information about the size and shape of your heart and how well your heart's chambers and valves are working. This procedure takes approximately one hour. There are no restrictions for this procedure.    Follow-Up: Your physician recommends that you schedule a follow-up appointment in: 3 MONTHS WITH DR. VARANASI ONLY  Any Other Special Instructions Will Be Listed Below (If Applicable). Echocardiogram An echocardiogram, or echocardiography, uses sound waves (ultrasound) to produce an image of your heart. The echocardiogram is simple, painless, obtained within a short period of time, and offers valuable information to your health care provider. The images from an echocardiogram can provide information such as:  Evidence of coronary artery disease (CAD).  Heart size.  Heart muscle function.  Heart valve function.  Aneurysm detection.  Evidence of a past heart attack.  Fluid buildup around the heart.  Heart muscle thickening.  Assess heart valve function.  Tell a health care provider about:  Any allergies you have.  All medicines you are  taking, including vitamins, herbs, eye drops, creams, and over-the-counter medicines.  Any problems you or family members have had with anesthetic medicines.  Any blood disorders you have.  Any surgeries you have had.  Any medical conditions you have.  Whether you are pregnant or may be pregnant. What happens before the procedure? No special preparation is needed. Eat and drink normally. What happens during the procedure?  In order to produce an image of your heart, gel will be applied to your chest and a wand-like tool (transducer) will be moved over your chest. The gel will help transmit the sound waves from the transducer. The sound waves will harmlessly bounce off your heart to allow the heart images to be captured in real-time motion. These images will then be recorded.  You may need an IV to receive a medicine that improves the quality of the pictures. What happens after the procedure? You may return to your normal schedule including diet, activities, and medicines, unless your health care provider tells you otherwise. This information is not intended to replace advice given to you by your health care provider. Make sure you discuss any questions you have with your health care provider. Document Released: 05/10/2000 Document Revised: 12/30/2015 Document Reviewed: 01/18/2013 Elsevier Interactive Patient Education  2017 [employer].

## 2018-05-01 LAB — BASIC METABOLIC PANEL
BUN / CREAT RATIO: 23 (ref 10–24)
BUN: 36 mg/dL — AB (ref 8–27)
CO2: 30 mmol/L — ABNORMAL HIGH (ref 20–29)
CREATININE: 1.58 mg/dL — AB (ref 0.76–1.27)
Calcium: 9.6 mg/dL (ref 8.6–10.2)
Chloride: 96 mmol/L (ref 96–106)
GFR calc Af Amer: 47 mL/min/{1.73_m2} — ABNORMAL LOW (ref >59)
GFR, EST NON AFRICAN AMERICAN: 41 mL/min/{1.73_m2} — AB (ref >59)
Glucose: 94 mg/dL (ref 65–99)
Potassium: 5 mmol/L (ref 3.5–5.2)
Sodium: 140 mmol/L (ref 134–144)

## 2018-05-04 ENCOUNTER — Other Ambulatory Visit: Payer: Self-pay

## 2018-05-04 ENCOUNTER — Ambulatory Visit (HOSPITAL_COMMUNITY): Payer: Medicare HMO | Attending: Cardiology

## 2018-05-04 DIAGNOSIS — I2510 Atherosclerotic heart disease of native coronary artery without angina pectoris: Secondary | ICD-10-CM | POA: Insufficient documentation

## 2018-05-04 DIAGNOSIS — I77810 Thoracic aortic ectasia: Secondary | ICD-10-CM

## 2018-05-04 DIAGNOSIS — I350 Nonrheumatic aortic (valve) stenosis: Secondary | ICD-10-CM | POA: Diagnosis not present

## 2018-05-04 DIAGNOSIS — R0600 Dyspnea, unspecified: Secondary | ICD-10-CM

## 2018-05-04 DIAGNOSIS — R0609 Other forms of dyspnea: Secondary | ICD-10-CM | POA: Insufficient documentation

## 2018-05-04 DIAGNOSIS — S81801D Unspecified open wound, right lower leg, subsequent encounter: Secondary | ICD-10-CM | POA: Diagnosis not present

## 2018-05-04 DIAGNOSIS — I5033 Acute on chronic diastolic (congestive) heart failure: Secondary | ICD-10-CM | POA: Diagnosis not present

## 2018-05-04 DIAGNOSIS — I480 Paroxysmal atrial fibrillation: Secondary | ICD-10-CM

## 2018-05-06 ENCOUNTER — Telehealth: Payer: Self-pay | Admitting: Physician Assistant

## 2018-05-06 DIAGNOSIS — N189 Chronic kidney disease, unspecified: Secondary | ICD-10-CM

## 2018-05-06 DIAGNOSIS — I5033 Acute on chronic diastolic (congestive) heart failure: Secondary | ICD-10-CM

## 2018-05-06 NOTE — Telephone Encounter (Signed)
No note needed/ error 

## 2018-05-06 NOTE — Telephone Encounter (Signed)
Called pt back re: Cardiac Rehab.  I left him a message to call back. I will let the pt know that I spoke with Corlette @ Rehab and she states that pt could come in for some maintenance rehab, Tuesdays & Thursdays, $61.10 monthly, would be the only thing he would qualify for. Per Corlette, pt's heart pumping is too strong for Medicare to pay for (which is a wonderful thing) Will see if pt is interested, if so, I will put referral in for Cardiac Rehab Maintenance.

## 2018-05-06 NOTE — Telephone Encounter (Signed)
° ° °  Patient returned call, nurse unavailable. Patient states he would like to participate in rehab. Please call

## 2018-05-06 NOTE — Telephone Encounter (Signed)
Tried to call pt back re: message below. Left another message for pt to call back and to have the operators page me if he calls back and they can't reach me.

## 2018-05-06 NOTE — Telephone Encounter (Signed)
I called David [name] today to follow up to see how he was doing and to go over his echo results. He states he is feeling about the same as last visit. He has noticed his weight has gone up a few lb the last few days despite taking torsemide 20mg  BID. He was 178 at home today. His weight has been extremely variable in the last 1.5 years, flucutating with his travels. In 2018 when doing well he traditionally hovered around 180. At his lowest he got down to 169 following diuresis in February 2019. He was 173 when seen in the office 09/2017 by Dr. Irish Lack and felt to be stable.. When I saw him in clinic to follow up his weight had gone from 184lb pre-diuresis to 176lb post-diuresis on 04/30/18. He had reported a home weight of around 173lb that day.  With the weight gain this week, he has not specifically noticed any worsening swelling. He has continued to go to his senior exercise class without any issues. As previously stated he just notices dyspnea with higher levels of exertion like persistent walking or going up hills. He has not had any new chest pain. His recent f/u Cr was stable but need to be cautious with diuretic and the fine line given his progression this year, as well as tendency towards low-normal BP. Given the trend towards weight gain this week, I asked him to take 1 extra torsemide the next 2-3 days and see how he feels.   I had reviewed his study with Dr. Irish Lack and traditionally given his comorbidities and multitude of issues a more conservative approach has been employed. David [name] agrees with this approach. For now we will hold course and not pursue invasive ischemic evaluation, but he will notify us for any acute changes.  I told him I would have nursing staff reach out to our EP team to see if there was anything they could detect on his PPM which would have caused a change in LV function or stamina. His last device check was 01/2018 but his CHF worsened in 03/2018.  We also discussed his  renal function again. He was unaware of this diagnosis but it looks like he had at least mild CKD in prior years as well. In retrospect now typing this note, I think it would be prudent to refer to nephrology to establish care since it can take some time to get in with one. It is not an urgent referral but a good idea moving forward. I did not discuss with the patient during our call today so will forward to nurse to reach out and inquire if he's interested.  I'll also cc to Dr. Irish Lack to make him aware of our discussion. Keep f/u 07/2018, sooner if needed.  David Hattabaugh PA-C

## 2018-05-06 NOTE — Telephone Encounter (Signed)
Spoke with pt and he is interested in a referral to Nephrology. (order in [place]) Pt did ask if there was some heart related exercises he can get into since his heart is weaker? I advised pt I would ask Dayna what her recommendations would be and get back with him.  I have routed this message to Device to thrive in as well on your message below.

## 2018-05-06 NOTE — Telephone Encounter (Signed)
He might qualify for maintenance cardiac rehab given his diastolic CHF and known coronary disease. Every insurance company is different if you can find out if cardiac rehab at [facility] would consider him a candidate for the program. Melina Copa PA-C

## 2018-05-06 NOTE — Telephone Encounter (Signed)
Attempted to call patient to instruct him to send a remote transmission.  No answer received at either home or cell.  Message left instructing patient to call back in AM at which time he will be asked to send a remote transmission.

## 2018-05-07 ENCOUNTER — Telehealth: Payer: Self-pay | Admitting: Cardiology

## 2018-05-07 NOTE — Telephone Encounter (Signed)
No issues with arrhythmia on his device.  I would continue to manage with diuretics, unless his sx change.

## 2018-05-07 NOTE — Telephone Encounter (Signed)
LMOVM for pt to return call 

## 2018-05-07 NOTE — Telephone Encounter (Signed)
Remote transmission reviewed. Normal device function. Presenting rhythm at 1046 shows As/Vp @ 63bpm. Ap 96%, Vp >99%, histograms consistent with previous remote checks. <1% AT/AF burden, available EGMs show competitive atrial pacing, longest 10sec. No high ventricular rates.  Spoke with patient, made him aware of stable device function. Scheduled for 31yr f/u appointment with Dr. Lovena Le on 07/17/18 per recall. He is appreciative of call and denies additional device-related questions at this time.

## 2018-05-07 NOTE — Telephone Encounter (Signed)
Spoke with pt re: referral to cardiac rehab, message below. Pt is interested in participating.  Referral has been put in.  Also seen message from Alycia Rossetti, Clinical Mgr, re: pt sending a remote transmission. Pt has been advised to do so and advised he would do when we hung up.  Will route to device to make sure someone receives it and f/u with the pt.

## 2018-05-07 NOTE — Addendum Note (Signed)
Addended by: Gaetano Net on: 05/07/2018 10:06 AM   Modules accepted: Orders

## 2018-05-07 NOTE — Telephone Encounter (Signed)
Patient called and stated that he sent his remote transmission. Informed pt that it was received. I informed him a nurse will review and call him back. Pt verbalized understanding.

## 2018-05-07 NOTE — Telephone Encounter (Signed)
Opened in error

## 2018-05-13 NOTE — Telephone Encounter (Signed)
Opened in error

## 2018-05-21 ENCOUNTER — Ambulatory Visit (INDEPENDENT_AMBULATORY_CARE_PROVIDER_SITE_OTHER): Payer: Medicare HMO

## 2018-05-21 DIAGNOSIS — I442 Atrioventricular block, complete: Secondary | ICD-10-CM

## 2018-05-21 NOTE — Progress Notes (Signed)
Remote pacemaker transmission.   

## 2018-05-24 DIAGNOSIS — G4731 Primary central sleep apnea: Secondary | ICD-10-CM | POA: Diagnosis not present

## 2018-05-24 LAB — CUP PACEART REMOTE DEVICE CHECK
Battery Remaining Longevity: 25 mo
Battery Remaining Percentage: 22 %
Brady Statistic AP VP Percent: 97 %
Brady Statistic AP VS Percent: 1 %
Brady Statistic AS VP Percent: 3.2 %
Brady Statistic AS VS Percent: 1 %
Brady Statistic RV Percent Paced: 99 %
Implantable Lead Implant Date: 20121031
Implantable Lead Location: 753860
Lead Channel Impedance Value: 390 Ohm
Lead Channel Pacing Threshold Amplitude: 1 V
Lead Channel Pacing Threshold Pulse Width: 0.5 ms
Lead Channel Sensing Intrinsic Amplitude: 1.1 mV
Lead Channel Sensing Intrinsic Amplitude: 12 mV
Lead Channel Setting Pacing Amplitude: 1.25 V
Lead Channel Setting Pacing Amplitude: 1.625
Lead Channel Setting Pacing Pulse Width: 0.5 ms
MDC IDC LEAD IMPLANT DT: 20121031
MDC IDC LEAD LOCATION: 753859
MDC IDC MSMT BATTERY VOLTAGE: 2.78 V
MDC IDC MSMT LEADCHNL RA PACING THRESHOLD AMPLITUDE: 0.625 V
MDC IDC MSMT LEADCHNL RA PACING THRESHOLD PULSEWIDTH: 0.5 ms
MDC IDC MSMT LEADCHNL RV IMPEDANCE VALUE: 440 Ohm
MDC IDC PG IMPLANT DT: 20121031
MDC IDC PG SERIAL: 7280216
MDC IDC SESS DTM: 20191226075302
MDC IDC SET LEADCHNL RV SENSING SENSITIVITY: 4 mV
MDC IDC STAT BRADY RA PERCENT PACED: 96 %
Pulse Gen Model: 2210

## 2018-05-26 ENCOUNTER — Encounter: Payer: Self-pay | Admitting: Cardiology

## 2018-05-26 ENCOUNTER — Other Ambulatory Visit: Payer: Self-pay

## 2018-05-29 ENCOUNTER — Other Ambulatory Visit: Payer: Self-pay

## 2018-05-29 DIAGNOSIS — I5033 Acute on chronic diastolic (congestive) heart failure: Secondary | ICD-10-CM

## 2018-05-29 DIAGNOSIS — I5032 Chronic diastolic (congestive) heart failure: Secondary | ICD-10-CM

## 2018-06-03 ENCOUNTER — Other Ambulatory Visit: Payer: Self-pay | Admitting: *Deleted

## 2018-06-03 ENCOUNTER — Other Ambulatory Visit: Payer: Medicare HMO | Admitting: *Deleted

## 2018-06-03 DIAGNOSIS — I5030 Unspecified diastolic (congestive) heart failure: Secondary | ICD-10-CM

## 2018-06-03 LAB — BASIC METABOLIC PANEL
BUN/Creatinine Ratio: 27 — ABNORMAL HIGH (ref 10–24)
BUN: 43 mg/dL — ABNORMAL HIGH (ref 8–27)
CHLORIDE: 96 mmol/L (ref 96–106)
CO2: 26 mmol/L (ref 20–29)
Calcium: 9.6 mg/dL (ref 8.6–10.2)
Creatinine, Ser: 1.57 mg/dL — ABNORMAL HIGH (ref 0.76–1.27)
GFR calc Af Amer: 47 mL/min/{1.73_m2} — ABNORMAL LOW (ref >59)
GFR calc non Af Amer: 41 mL/min/{1.73_m2} — ABNORMAL LOW (ref >59)
GLUCOSE: 116 mg/dL — AB (ref 65–99)
Potassium: 3.8 mmol/L (ref 3.5–5.2)
SODIUM: 137 mmol/L (ref 134–144)

## 2018-06-04 ENCOUNTER — Telehealth: Payer: Self-pay | Admitting: Physician Assistant

## 2018-06-04 NOTE — Telephone Encounter (Signed)
Called and made patient aware of lab results and recommendations to decrease Eliquis to 2.5 mg BID. Patient has f/u appt with Dr. Irish Lack on 08/07/18 at 8:00 AM. Patient is asking if he should continue taking his torsemide as he has been. Patient states that he has been taking 40 mg in the AM and 20 mg in the PM before 2 PM. He states that he was told to increase it on 05/06/18 Lisbeth Renshaw Dunn, PA instructed him to increase it for 2-3 days to see how he feels, Dr. Irish Lack instructed to continue to manage with diuretics, unless his Sx change). Patient states that he has been taking the extra one since that phone call. Patient's labs are stable on current regimen and he denies SOB, weight gain, increased swelling. Instructed patient to continue with current regimen and let us know if his Sx change or worsen before his appointment.

## 2018-06-04 NOTE — Telephone Encounter (Signed)
° ° °  Please return call to patient with lab results.  Notes recorded by Michaelyn Barter, RN on 06/03/2018 at 4:49 PM EST Left message for patient to call back. ------  Notes recorded by Charlie Pitter, PA-C on 06/03/2018 at 4:44 PM EST Please let patient know labs are fairly stable compared to prior. Now that he is 78 years old (happy birthday!) and Cr is persistently greater than 1.5, he requires the lower dose of Eliquis. Please change to 2.5mg  BID. Needs f/u ~07/2018 with Dr. Octaviano Glow Dunn PA-C

## 2018-06-22 ENCOUNTER — Encounter (HOSPITAL_COMMUNITY)
Admission: RE | Admit: 2018-06-22 | Discharge: 2018-06-22 | Disposition: A | Discharge status: Home / Self Care | Payer: Self-pay | Source: Ambulatory Visit | Attending: Interventional Cardiology | Admitting: Interventional Cardiology

## 2018-06-22 DIAGNOSIS — I5032 Chronic diastolic (congestive) heart failure: Secondary | ICD-10-CM | POA: Insufficient documentation

## 2018-06-24 ENCOUNTER — Encounter (HOSPITAL_COMMUNITY)
Admission: RE | Admit: 2018-06-24 | Discharge: 2018-06-24 | Disposition: A | Discharge status: Home / Self Care | Payer: Self-pay | Source: Ambulatory Visit | Attending: Interventional Cardiology | Admitting: Interventional Cardiology

## 2018-06-24 DIAGNOSIS — G4731 Primary central sleep apnea: Secondary | ICD-10-CM | POA: Diagnosis not present

## 2018-06-26 ENCOUNTER — Telehealth: Payer: Self-pay | Admitting: Nurse Practitioner

## 2018-06-26 ENCOUNTER — Encounter (HOSPITAL_COMMUNITY)
Admission: RE | Admit: 2018-06-26 | Discharge: 2018-06-26 | Disposition: A | Discharge status: Home / Self Care | Payer: Self-pay | Source: Ambulatory Visit | Attending: Interventional Cardiology | Admitting: Interventional Cardiology

## 2018-06-26 NOTE — Telephone Encounter (Signed)
Thanks Amber. 

## 2018-06-26 NOTE — Telephone Encounter (Signed)
[facility]lin alert received for AF burden > threshold. Pt in persistent AF since 06/18/18. Appropriately anticoagulated with Eliquis. He has had symptoms of exercise intolerance and fatigue. Appt made with Dr Lovena Le 06/30/18 to discuss options for restoring SR. Pt aware and agrees with plan.   Chanetta Marshall, NP 06/26/2018 3:15 PM

## 2018-06-29 ENCOUNTER — Encounter (HOSPITAL_COMMUNITY)
Admission: RE | Admit: 2018-06-29 | Discharge: 2018-06-29 | Disposition: A | Discharge status: Home / Self Care | Payer: Self-pay | Source: Ambulatory Visit | Attending: Interventional Cardiology | Admitting: Interventional Cardiology

## 2018-06-29 DIAGNOSIS — I5032 Chronic diastolic (congestive) heart failure: Secondary | ICD-10-CM | POA: Insufficient documentation

## 2018-06-30 ENCOUNTER — Ambulatory Visit: Payer: Medicare HMO | Admitting: Internal Medicine

## 2018-06-30 ENCOUNTER — Encounter: Payer: Self-pay | Admitting: Internal Medicine

## 2018-06-30 VITALS — BP 120/58 | HR 70 | Ht 72.0 in | Wt 180.0 lb

## 2018-06-30 DIAGNOSIS — Z950 Presence of cardiac pacemaker: Secondary | ICD-10-CM | POA: Diagnosis not present

## 2018-06-30 DIAGNOSIS — I5032 Chronic diastolic (congestive) heart failure: Secondary | ICD-10-CM

## 2018-06-30 DIAGNOSIS — I480 Paroxysmal atrial fibrillation: Secondary | ICD-10-CM | POA: Diagnosis not present

## 2018-06-30 DIAGNOSIS — I442 Atrioventricular block, complete: Secondary | ICD-10-CM

## 2018-06-30 LAB — CUP PACEART INCLINIC DEVICE CHECK
Battery Remaining Longevity: 16 mo
Battery Voltage: 2.75 V
Brady Statistic RA Percent Paced: 93 %
Brady Statistic RV Percent Paced: 99.95 %
Date Time Interrogation Session: 20200204113057
Implantable Lead Implant Date: 20121031
Implantable Lead Location: 753859
Implantable Lead Location: 753860
Implantable Pulse Generator Implant Date: 20121031
Lead Channel Impedance Value: 375 Ohm
Lead Channel Impedance Value: 400 Ohm
Lead Channel Pacing Threshold Amplitude: 0.625 V
Lead Channel Pacing Threshold Pulse Width: 0.5 ms
Lead Channel Pacing Threshold Pulse Width: 0.5 ms
Lead Channel Sensing Intrinsic Amplitude: 1.9 mV
Lead Channel Sensing Intrinsic Amplitude: 12 mV
Lead Channel Setting Pacing Amplitude: 1.625
Lead Channel Setting Pacing Pulse Width: 0.5 ms
MDC IDC LEAD IMPLANT DT: 20121031
MDC IDC MSMT LEADCHNL RV PACING THRESHOLD AMPLITUDE: 1 V
MDC IDC SET LEADCHNL RV PACING AMPLITUDE: 1.25 V
MDC IDC SET LEADCHNL RV SENSING SENSITIVITY: 4 mV
Pulse Gen Model: 2210
Pulse Gen Serial Number: 7280216

## 2018-06-30 NOTE — Progress Notes (Signed)
HPI Mr. Helbing returns today for evaluation of atrial arrhythmias. He is a pleasant 78 yo man with CHB, s/p PPM, CAD, s/p CABG, HTN, chronic systolic heart failure who has had worsening symptoms beginning about 2 -3 weeks ago. He has been feeling more tired and short winded.  Allergies  Allergen Reactions  . Benadryl [Diphenhydramine Hcl] Other (See Comments)    Adverse reaction, has very strange behavior. Also hyperactive.  [name] Antihistamines, Chlorpheniramine-Type Palpitations  . Restoril [Temazepam] Other (See Comments) and Palpitations    Hyperactive     Current Outpatient Medications  Medication Sig Dispense Refill  . acetaminophen (TYLENOL) 500 MG tablet Take 500 mg by mouth every 6 (six) hours as needed for mild pain.    [name] apixaban (ELIQUIS) 5 MG TABS tablet Take 2.5 mg by mouth 2 (two) times daily.    . cholecalciferol (VITAMIN D) 1000 UNITS tablet Take 1,000 Units by mouth daily.    . Coenzyme Q10 (COQ10) 100 MG CAPS Take 100 mg by mouth daily.     . Cyanocobalamin (VITAMIN B-12) 2500 MCG SUBL Place 1 tablet under the tongue daily.    . fluticasone (FLONASE) 50 MCG/ACT nasal spray Place 1 spray into both nostrils daily as needed for allergies or rhinitis.     [name] loratadine (CLARITIN) 10 MG tablet Take 10 mg by mouth daily as needed for allergies.    . metoprolol tartrate (LOPRESSOR) 50 MG tablet Take 0.5 tablets (25 mg total) by mouth 2 (two) times daily. 60 tablet 0  . Multiple Vitamin (MULTIVITAMIN) capsule TAKE 1 CAPSULE BY MOUTH THREE TIMES A WEEK    . nitroGLYCERIN (NITROSTAT) 0.4 MG SL tablet Place 1 tablet (0.4 mg total) under the tongue every 5 (five) minutes as needed. For chest pain 25 tablet 5  . Omega-3 Fatty Acids (FISH OIL PO) Take 1,000 Units by mouth daily. 1 capsule with a meal    . pramipexole (MIRAPEX) 1 MG tablet Take 2 mg by mouth at bedtime.      . torsemide (DEMADEX) 20 MG tablet Take 20 mg by mouth. Take 2 tablets (40 mg total) in the AM and 1 tablet  (20 mg total) in the PM    . vitamin C (ASCORBIC ACID) 500 MG tablet Take 500 mg by mouth daily.    . simvastatin (ZOCOR) 10 MG tablet Take 10 mg by mouth daily.       No current facility-administered medications for this visit.      Past Medical History:  Diagnosis Date  . Aortic stenosis    a.mild-mod by echo 06/2017.  . Ascending aorta dilatation (HCC)    a. mildly dilated aortic root and mod dilated ascending aorta by echo 06/2017.  [name] Chronic diastolic CHF (congestive heart failure) ([place])   . CKD (chronic kidney disease), stage III ([place])   . Complete heart block (HCC)    a. s/p STJ pacemaker in 02/2011 - Dr. [name]  . Coronary artery disease    a. LAD atherectomy 1999.  . Gilbert's syndrome   . Hypertension   . Insomnia, unspecified   . Pacemaker-St Judes 07/16/2011  . PAF (paroxysmal atrial fibrillation) ([place])    a. identified on pacemaker interrogation; on Eliquis.  . Peripheral neuropathy   . Pure hypercholesterolemia   . Restless leg syndrome   . Spermatocele    near left testicle 2 cm  . Spinal stenosis, lumbar   . Thrombocytopenia ([place])    Averaging 140,000 since 2008  .  Tubular adenoma 09/2012   Dr. Watt Climes repeat 4 years  . Type II or unspecified type diabetes mellitus without mention of complication, not stated as uncontrolled   . Unspecified sleep apnea     ROS:   All systems reviewed and negative except as noted in the HPI.   Past Surgical History:  Procedure Laterality Date  . APPENDECTOMY  1957  . CHOLECYSTECTOMY  1988  . CORONARY STENT PLACEMENT    . HEMORROIDECTOMY  1980  . HERNIA REPAIR Bilateral   . pacermaker  07/16/11   Dr. [name]  . TEE WITHOUT CARDIOVERSION N/A 06/14/2014   Procedure: TRANSESOPHAGEAL ECHOCARDIOGRAM (TEE);  Surgeon: [name], MD;  Location: [facility] ENDOSCOPY;  Service: Cardiovascular;  Laterality: N/A;     Family History  Problem Relation Age of Onset  . Cancer Mother        Small cell lung cancer  . Diabetes Father         DM  . CAD Father   . Hypertension Father   . Heart disease Father   . Heart attack Father   . Hypertension Sister      Social History   Socioeconomic History  . Marital status: Married    Spouse name: Not on file  . Number of children: Not on file  . Years of education: Not on file  . Highest education level: Not on file  Occupational History  . Not on file  Social Needs  . Financial resource strain: Not on file  . Food insecurity:    Worry: Not on file    Inability: Not on file  . Transportation needs:    Medical: Not on file    Non-medical: Not on file  Tobacco Use  . Smoking status: Former Research scientist (life sciences)  . Smokeless tobacco: Never Used  Substance and Sexual Activity  . Alcohol use: Yes    Comment: occasional glass of wine  . Drug use: No  . Sexual activity: Not on file  Lifestyle  . Physical activity:    Days per week: Not on file    Minutes per session: Not on file  . Stress: Not on file  Relationships  . Social connections:    Talks on phone: Not on file    Gets together: Not on file    Attends religious service: Not on file    Active member of club or organization: Not on file    Attends meetings of clubs or organizations: Not on file    Relationship status: Not on file  . Intimate partner violence:    Fear of current or ex partner: Not on file    Emotionally abused: Not on file    Physically abused: Not on file    Forced sexual activity: Not on file  Other Topics Concern  . Not on file  Social History Narrative  . Not on file     BP (!) 120/58   Pulse 70   Ht 6' (1.829 m)   Wt 180 lb (81.6 kg)   BMI 24.41 kg/m   Physical Exam:  Well appearing elderly man, NAD HEENT: Unremarkable Neck:  No JVD, no thyromegally Lymphatics:  No adenopathy Back:  No CVA tenderness Lungs:  Clear with no wheezes HEART:  Regular rate rhythm, no murmurs, no rubs, no clicks Abd:  soft, positive bowel sounds, no organomegally, no rebound, no guarding Ext:  2 plus  pulses, no edema, no cyanosis, no clubbing Skin:  No rashes no nodules Neuro:  CN II  through XII intact, motor grossly intact  EKG - nsr with ventricular pacing.   DEVICE  Normal device function.  See PaceArt for details.   Assess/Plan: 1. Atrial fib/flutter - he does not have any RVR but when he is out of rhythm he feels poorly. He does not have palpitations. I have recommended he undergo DC CV when he has been on his Eliquis for 3 weeks and we know for sure he has not missed any doses. 2. CAD - he denies any anginal symptoms.  3. PPM - his St. Jude DDD PM is working normally. He has been in atrial fib/flutter for about 2 weeks.   [name].D

## 2018-06-30 NOTE — Patient Instructions (Addendum)
Medication Instructions:  Your physician recommends that you continue on your current medications as directed. Please refer to the Current Medication list given to you today.  Labwork: None ordered.  Testing/Procedures: Your physician has recommended that you have a Cardioversion (DCCV). Electrical Cardioversion uses a jolt of electricity to your heart either through paddles or wired patches attached to your chest. This is a controlled, usually prescheduled, procedure. Defibrillation is done under light anesthesia in the hospital, and you usually go home the day of the procedure. This is done to get your heart back into a normal rhythm. You are not awake for the procedure. Please see the instruction sheet given to you today.  You are scheduled for a cardioversion on July 22, 2018.  Follow-Up: Your physician wants you to follow-up in: 4-5 weeks with Dr. Lovena Le after DCCV scheduled for July 22, 2018.    PHYS PACER  Remote monitoring is used to monitor your Pacemaker from home. This monitoring reduces the number of office visits required to check your device to one time per year. It allows [place] to keep an eye on the functioning of your device to ensure it is working properly. You are scheduled for a device check from home on 08/20/2018. You may send your transmission at any time that day. If you have a wireless device, the transmission will be sent automatically. After your physician reviews your transmission, you will receive a postcard with your next transmission date.  Any Other Special Instructions Will Be Listed Below (If Applicable).  If you need a refill on your cardiac medications before your next appointment, please call your pharmacy.    You are scheduled for a Cardioversion July 22, 2018 with Dr. Lovena Le.  Please arrive at the [facility] (Main Entrance A) at [facility]: [address] [place], [place] [address] at 11:30 am.   DIET: Nothing to eat or drink after  midnight except a sip of water with medications (see medication instructions below)  Medication Instructions:  Take all your normal morning medication with a sip of water EXCEPT TORSEMIDE.   Labs:   Upon arrival for your cardioversion  You must have a responsible person to drive you home and stay in the waiting area during your procedure. Failure to do so could result in cancellation.  Bring your insurance cards.  *Special Note: Every effort is made to have your procedure done on time. Occasionally there are emergencies that occur at the hospital that may cause delays. Please be patient if a delay does occur.

## 2018-06-30 NOTE — H&P (View-Only) (Signed)
HPI [name] returns today for evaluation of atrial arrhythmias. He is a pleasant 78 yo man with CHB, s/p PPM, CAD, s/p CABG, HTN, chronic systolic heart failure who has had worsening symptoms beginning about 2 -3 weeks ago. He has been feeling more tired and short winded.  Allergies  Allergen Reactions  . Benadryl [Diphenhydramine Hcl] Other (See Comments)    Adverse reaction, has very strange behavior. Also hyperactive.  [name] Antihistamines, Chlorpheniramine-Type Palpitations  . Restoril [Temazepam] Other (See Comments) and Palpitations    Hyperactive     Current Outpatient Medications  Medication Sig Dispense Refill  . acetaminophen (TYLENOL) 500 MG tablet Take 500 mg by mouth every 6 (six) hours as needed for mild pain.    [name] apixaban (ELIQUIS) 5 MG TABS tablet Take 2.5 mg by mouth 2 (two) times daily.    . cholecalciferol (VITAMIN D) 1000 UNITS tablet Take 1,000 Units by mouth daily.    . Coenzyme Q10 (COQ10) 100 MG CAPS Take 100 mg by mouth daily.     . Cyanocobalamin (VITAMIN B-12) 2500 MCG SUBL Place 1 tablet under the tongue daily.    . fluticasone (FLONASE) 50 MCG/ACT nasal spray Place 1 spray into both nostrils daily as needed for allergies or rhinitis.     [name] loratadine (CLARITIN) 10 MG tablet Take 10 mg by mouth daily as needed for allergies.    . metoprolol tartrate (LOPRESSOR) 50 MG tablet Take 0.5 tablets (25 mg total) by mouth 2 (two) times daily. 60 tablet 0  . Multiple Vitamin (MULTIVITAMIN) capsule TAKE 1 CAPSULE BY MOUTH THREE TIMES A WEEK    . nitroGLYCERIN (NITROSTAT) 0.4 MG SL tablet Place 1 tablet (0.4 mg total) under the tongue every 5 (five) minutes as needed. For chest pain 25 tablet 5  . Omega-3 Fatty Acids (FISH OIL PO) Take 1,000 Units by mouth daily. 1 capsule with a meal    . pramipexole (MIRAPEX) 1 MG tablet Take 2 mg by mouth at bedtime.      . torsemide (DEMADEX) 20 MG tablet Take 20 mg by mouth. Take 2 tablets (40 mg total) in the AM and 1 tablet  (20 mg total) in the PM    . vitamin C (ASCORBIC ACID) 500 MG tablet Take 500 mg by mouth daily.    . simvastatin (ZOCOR) 10 MG tablet Take 10 mg by mouth daily.       No current facility-administered medications for this visit.      Past Medical History:  Diagnosis Date  . Aortic stenosis    a.mild-mod by echo 06/2017.  . Ascending aorta dilatation (HCC)    a. mildly dilated aortic root and mod dilated ascending aorta by echo 06/2017.  [name] Chronic diastolic CHF (congestive heart failure) ([place])   . CKD (chronic kidney disease), stage III ([place])   . Complete heart block (HCC)    a. s/p STJ pacemaker in 02/2011 - Dr. [name]  . Coronary artery disease    a. LAD atherectomy 1999.  . Gilbert's syndrome   . Hypertension   . Insomnia, unspecified   . Pacemaker-St Judes 07/16/2011  . PAF (paroxysmal atrial fibrillation) ([place])    a. identified on pacemaker interrogation; on Eliquis.  . Peripheral neuropathy   . Pure hypercholesterolemia   . Restless leg syndrome   . Spermatocele    near left testicle 2 cm  . Spinal stenosis, lumbar   . Thrombocytopenia ([place])    Averaging 140,000 since 2008  .  Tubular adenoma 09/2012   Dr. Watt Climes repeat 4 years  . Type II or unspecified type diabetes mellitus without mention of complication, not stated as uncontrolled   . Unspecified sleep apnea     ROS:   All systems reviewed and negative except as noted in the HPI.   Past Surgical History:  Procedure Laterality Date  . APPENDECTOMY  1957  . CHOLECYSTECTOMY  1988  . CORONARY STENT PLACEMENT    . HEMORROIDECTOMY  1980  . HERNIA REPAIR Bilateral   . pacermaker  07/16/11   Dr. [name]  . TEE WITHOUT CARDIOVERSION N/A 06/14/2014   Procedure: TRANSESOPHAGEAL ECHOCARDIOGRAM (TEE);  Surgeon: [name], MD;  Location: [facility] ENDOSCOPY;  Service: Cardiovascular;  Laterality: N/A;     Family History  Problem Relation Age of Onset  . Cancer Mother        Small cell lung cancer  . Diabetes Father         DM  . CAD Father   . Hypertension Father   . Heart disease Father   . Heart attack Father   . Hypertension Sister      Social History   Socioeconomic History  . Marital status: Married    Spouse name: Not on file  . Number of children: Not on file  . Years of education: Not on file  . Highest education level: Not on file  Occupational History  . Not on file  Social Needs  . Financial resource strain: Not on file  . Food insecurity:    Worry: Not on file    Inability: Not on file  . Transportation needs:    Medical: Not on file    Non-medical: Not on file  Tobacco Use  . Smoking status: Former Research scientist (life sciences)  . Smokeless tobacco: Never Used  Substance and Sexual Activity  . Alcohol use: Yes    Comment: occasional glass of wine  . Drug use: No  . Sexual activity: Not on file  Lifestyle  . Physical activity:    Days per week: Not on file    Minutes per session: Not on file  . Stress: Not on file  Relationships  . Social connections:    Talks on phone: Not on file    Gets together: Not on file    Attends religious service: Not on file    Active member of club or organization: Not on file    Attends meetings of clubs or organizations: Not on file    Relationship status: Not on file  . Intimate partner violence:    Fear of current or ex partner: Not on file    Emotionally abused: Not on file    Physically abused: Not on file    Forced sexual activity: Not on file  Other Topics Concern  . Not on file  Social History Narrative  . Not on file     BP (!) 120/58   Pulse 70   Ht 6' (1.829 m)   Wt 180 lb (81.6 kg)   BMI 24.41 kg/m   Physical Exam:  Well appearing elderly man, NAD HEENT: Unremarkable Neck:  No JVD, no thyromegally Lymphatics:  No adenopathy Back:  No CVA tenderness Lungs:  Clear with no wheezes HEART:  Regular rate rhythm, no murmurs, no rubs, no clicks Abd:  soft, positive bowel sounds, no organomegally, no rebound, no guarding Ext:  2 plus  pulses, no edema, no cyanosis, no clubbing Skin:  No rashes no nodules Neuro:  CN II  through XII intact, motor grossly intact  EKG - nsr with ventricular pacing.   DEVICE  Normal device function.  See PaceArt for details.   Assess/Plan: 1. Atrial fib/flutter - he does not have any RVR but when he is out of rhythm he feels poorly. He does not have palpitations. I have recommended he undergo DC CV when he has been on his Eliquis for 3 weeks and we know for sure he has not missed any doses. 2. CAD - he denies any anginal symptoms.  3. PPM - his St. Jude DDD PM is working normally. He has been in atrial fib/flutter for about 2 weeks.   [name].D

## 2018-07-01 ENCOUNTER — Encounter (HOSPITAL_COMMUNITY)
Admission: RE | Admit: 2018-07-01 | Discharge: 2018-07-01 | Disposition: A | Discharge status: Home / Self Care | Payer: Medicare HMO | Source: Ambulatory Visit | Attending: Interventional Cardiology | Admitting: Interventional Cardiology

## 2018-07-01 DIAGNOSIS — Z1389 Encounter for screening for other disorder: Secondary | ICD-10-CM | POA: Diagnosis not present

## 2018-07-01 DIAGNOSIS — I10 Essential (primary) hypertension: Secondary | ICD-10-CM | POA: Diagnosis not present

## 2018-07-01 DIAGNOSIS — Z950 Presence of cardiac pacemaker: Secondary | ICD-10-CM | POA: Diagnosis not present

## 2018-07-01 DIAGNOSIS — D696 Thrombocytopenia, unspecified: Secondary | ICD-10-CM | POA: Diagnosis not present

## 2018-07-01 DIAGNOSIS — E7800 Pure hypercholesterolemia, unspecified: Secondary | ICD-10-CM | POA: Diagnosis not present

## 2018-07-01 DIAGNOSIS — Z0000 Encounter for general adult medical examination without abnormal findings: Secondary | ICD-10-CM | POA: Diagnosis not present

## 2018-07-01 DIAGNOSIS — I77810 Thoracic aortic ectasia: Secondary | ICD-10-CM | POA: Diagnosis not present

## 2018-07-01 DIAGNOSIS — I480 Paroxysmal atrial fibrillation: Secondary | ICD-10-CM | POA: Diagnosis not present

## 2018-07-01 DIAGNOSIS — H9201 Otalgia, right ear: Secondary | ICD-10-CM | POA: Diagnosis not present

## 2018-07-01 DIAGNOSIS — G4733 Obstructive sleep apnea (adult) (pediatric): Secondary | ICD-10-CM | POA: Diagnosis not present

## 2018-07-01 DIAGNOSIS — I5032 Chronic diastolic (congestive) heart failure: Secondary | ICD-10-CM | POA: Diagnosis not present

## 2018-07-01 DIAGNOSIS — E1169 Type 2 diabetes mellitus with other specified complication: Secondary | ICD-10-CM | POA: Diagnosis not present

## 2018-07-01 NOTE — Telephone Encounter (Signed)
Please assist. Melina Copa PA-C

## 2018-07-02 ENCOUNTER — Ambulatory Visit: Payer: Medicare HMO | Admitting: Neurology

## 2018-07-03 ENCOUNTER — Encounter (HOSPITAL_COMMUNITY): Payer: Self-pay

## 2018-07-03 NOTE — Progress Notes (Signed)
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 male , born in [place] and seen here as in a referral/ revisit  from Dr. [name] for a new sleep evaluation . Wife has reported irregular breathing at night, gasping and mild snoring. He underwent a 2007 sleep study at [place], with an AHI of 1.0/hr. Bradycardia. No CPAP was necessary.  He has been treated for RLS since 1999, on sinemet , Klonopin, Requip and now Mirapex ,now at 2 mg . He later developed heart block and in 2012 a cardiac pacemaker was implanted.He now gets more restless during the night. He has no insomnia, falls asleep easily but has 1-2 bathroom breaks. He talks in his sleep. He has kicked a ball during one of his dreams and fell out of bed. He has neuropathy, too.   Sleep habits are as follows: his bedtime is usually between 11 pm and 1 AM, he likes to work on his computer late.  He falls asleep promptly, in his cool, dark and quiet environment. He has to urinate once at 4 AM, and goes back to sleep. He sleeps until 5 AM, and stays in bed a little longer , rising at 6.30 AM. He feels refreshed , he can concentrate, focus.  He does have vivid dreams sometimes, but does not always remember them. He often sleeps supine, and in that position produces most apnea and irregular breathing. His wife has confirmed that the supine sleep position seems to predispose him to apnea. He sleeps on 2 pillows. He is restless- he likes a hard, firm surface. He likes a weighted blanket Result of ASV titration. Central Sleep Apnea, emerging under CPAP and BiPAP and finally controlled under ASV after other modalities failed.   PLANS/RECOMMENDATIONS:  We will place this patient on ASV- 15 maximum pressure support, 7 minimum pressure support and 4 cm EEP. Heated humidity and a  Respironics Dream wear FFM in size small were added to the order. [name], M.D.    05 09-2017   12-31-2017, RV with David [name] present. Had abnormal creatinine levels, has complete heart block, pacemaker since 2012 ( after bradycardia), later had a CVA, recently  had severe water retention. Gained 20 pounds water weight while in [place] this summer when he didn't take his diuretics.  He uses ASV with success and noticed more alertness. This after failing first CPAP and then BiPAP.  He is chronically anticoagulated, has a pacemaker, and used to have more restless leg syndrome with neuropathy.  Controlled however on the Mirapex.  The patient did the best on an ASV machine with 15 cm maximum pressure support, 7 cm with a minimum pressure support and 4 cm expiratory range.  Heated humidity and a Respironics DreamWear full facemask was suggested. He continues to follow with Dr. Lovena Le for electro- physiology. [name] also reminded me that he has been taking Mirapex for about 17 years ever since it came to market for the treatment of restless legs.  His restless legs follow the development of neuropathy and he was interested to see if he still needs Mirapex, if the dose could be reduced or if other medications could supplement its effect.  We discussed Neurontin and for this reason the generic form of gabapentin I looked at his metabolic panels which revealed for February of this year a  decrease in creatinine clearance indicative of CKD stage II.  UPDATE 2/10/2020CM David [name], 78 year old male returns for follow-up for ASV compliance.  He has not had any difficulty.  He is due for cardioversion February 26.  Data dated 06/03/2018-07/02/2018 shows compliance greater than 4 hours at 77% for 23 days.  Less than 4 hours 6 days 20% for total compliance of 97%.  Average use 6 hours 23 minutes.  Pressure 7 to 15 cm.  Leak 95th percentile 9.6.  AHI 18.4.  He returns for reevaluation REVIEW OF SYSTEMS: Full 14 system  review of systems performed and notable only for those listed, all others are neg:  Constitutional: neg  Cardiovascular: neg Ear/Nose/Throat: neg  Skin: neg Eyes: neg Respiratory: neg Gastroitestinal: neg  Hematology/Lymphatic: Easy bruising Endocrine: Intolerance to cold Musculoskeletal: Walking difficulty Allergy/Immunology: neg Neurological: neg Psychiatric: neg Sleep : ASV    ALLERGIES: Allergies  Allergen Reactions  . Benadryl [Diphenhydramine Hcl] Other (See Comments)    Adverse reaction, has very strange behavior. Also hyperactive.  [name] Antihistamines, Chlorpheniramine-Type Palpitations  . Restoril [Temazepam] Other (See Comments) and Palpitations    Hyperactive    HOME MEDICATIONS: Outpatient Medications Prior to Visit  Medication Sig Dispense Refill  . acetaminophen (TYLENOL) 500 MG tablet Take 500 mg by mouth every 6 (six) hours as needed for mild pain.    [name] apixaban (ELIQUIS) 5 MG TABS tablet Take 2.5 mg by mouth 2 (two) times daily.    . cholecalciferol (VITAMIN D) 1000 UNITS tablet Take 1,000 Units by mouth daily.    . Coenzyme Q10 (COQ10) 100 MG CAPS Take 200 mg by mouth daily.     . Cyanocobalamin (VITAMIN B-12) 2500 MCG SUBL Place 1 tablet under the tongue daily.    . fluticasone (FLONASE) 50 MCG/ACT nasal spray Place 1 spray into both nostrils daily as needed for allergies or rhinitis.     [name] lisinopril (PRINIVIL,ZESTRIL) 5 MG tablet Take 5 mg by mouth daily.    [name] loratadine (CLARITIN) 10 MG tablet Take 10 mg by mouth daily as needed for allergies.    . metoprolol tartrate (LOPRESSOR) 50 MG tablet Take 0.5 tablets (25 mg total) by mouth 2 (two) times daily. 60 tablet 0  . Multiple Vitamin (MULTIVITAMIN) capsule TAKE 1 CAPSULE BY MOUTH THREE TIMES A WEEK    . nitroGLYCERIN (NITROSTAT) 0.4 MG SL tablet Place 1 tablet (0.4 mg total) under the tongue every 5 (five) minutes as needed. For chest pain 25 tablet 5  . Omega-3 Fatty Acids (FISH OIL PO) Take 1,000 Units by  mouth daily. 1 capsule with a meal    . pramipexole (MIRAPEX) 1 MG tablet Take 2 mg by mouth at bedtime.      . simvastatin (ZOCOR) 10 MG tablet Take 10 mg by mouth daily.      [name] torsemide (DEMADEX) 20 MG tablet Take 20 mg by mouth. Take 2 tablets (40 mg total) in the AM and 1 tablet (20 mg total) in the PM    . vitamin C (ASCORBIC ACID) 500 MG tablet Take 500 mg by mouth daily.     No facility-administered medications prior to visit.     PAST MEDICAL HISTORY: Past Medical History:  Diagnosis Date  . Aortic stenosis    a.mild-mod by echo 06/2017.  . Ascending aorta dilatation (HCC)    a. mildly dilated aortic root and mod dilated ascending aorta by echo 06/2017.  [name] Chronic diastolic CHF (congestive heart failure) ([place])   . CKD (  chronic kidney disease), stage III ([place])   . Complete heart block (HCC)    a. s/p STJ pacemaker in 02/2011 - Dr. [name]  . Coronary artery disease    a. LAD atherectomy 1999.  . Gilbert's syndrome   . Hypertension   . Insomnia, unspecified   . Pacemaker-St Judes 07/16/2011  . PAF (paroxysmal atrial fibrillation) ([place])    a. identified on pacemaker interrogation; on Eliquis.  . Peripheral neuropathy   . Pure hypercholesterolemia   . Restless leg syndrome   . Spermatocele    near left testicle 2 cm  . Spinal stenosis, lumbar   . Thrombocytopenia ([place])    Averaging 140,000 since 2008  . Tubular adenoma 09/2012   Dr. Watt Climes repeat 4 years  . Type II or unspecified type diabetes mellitus without mention of complication, not stated as uncontrolled   . Unspecified sleep apnea     PAST SURGICAL HISTORY: Past Surgical History:  Procedure Laterality Date  . APPENDECTOMY  1957  . CHOLECYSTECTOMY  1988  . CORONARY STENT PLACEMENT    . HEMORROIDECTOMY  1980  . HERNIA REPAIR Bilateral   . pacermaker  07/16/11   Dr. [name]  . TEE WITHOUT CARDIOVERSION N/A 06/14/2014   Procedure: TRANSESOPHAGEAL ECHOCARDIOGRAM (TEE);  Surgeon: [name], MD;  Location[facility]  ENDOSCOPY;  Service: Cardiovascular;  Laterality: N/A;    FAMILY HISTORY: Family History  Problem Relation Age of Onset  . Cancer Mother        Small cell lung cancer  . Diabetes Father        DM  . CAD Father   . Hypertension Father   . Heart disease Father   . Heart attack Father   . Hypertension Sister     SOCIAL HISTORY: Social History   Socioeconomic History  . Marital status: Married    Spouse name: Not on file  . Number of children: Not on file  . Years of education: Not on file  . Highest education level: Not on file  Occupational History  . Not on file  Social Needs  . Financial resource strain: Not on file  . Food insecurity:    Worry: Not on file    Inability: Not on file  . Transportation needs:    Medical: Not on file    Non-medical: Not on file  Tobacco Use  . Smoking status: Former Research scientist (life sciences)  . Smokeless tobacco: Never Used  Substance and Sexual Activity  . Alcohol use: Yes    Comment: occasional glass of wine  . Drug use: No  . Sexual activity: Not on file  Lifestyle  . Physical activity:    Days per week: Not on file    Minutes per session: Not on file  . Stress: Not on file  Relationships  . Social connections:    Talks on phone: Not on file    Gets together: Not on file    Attends religious service: Not on file    Active member of club or organization: Not on file    Attends meetings of clubs or organizations: Not on file    Relationship status: Not on file  . Intimate partner violence:    Fear of current or ex partner: Not on file    Emotionally abused: Not on file    Physically abused: Not on file    Forced sexual activity: Not on file  Other Topics Concern  . Not on file  Social History Narrative  . Not on  file     PHYSICAL EXAM  Vitals:   07/06/18 1101 07/06/18 1115  BP: (!) 84/50 (!) 93/56  Pulse: 69 70  SpO2: 96%   Weight: 184 lb 12.8 oz (83.8 kg)   Height: 6' (1.829 m)    Body mass index is 25.06  kg/m.  Generalized: Well developed, in no acute distress  Head: normocephalic and atraumatic,. Oropharynx benign mallopatti 2 Neck: Supple, circumference 15.5 Musculoskeletal: No deformity   Neurological examination   Mentation: Alert oriented to time, place, history taking. Attention span and concentration appropriate. Recent and remote memory intact.  Follows all commands speech and language fluent.   Cranial nerve II-XII: .Pupils were equal round reactive to light extraocular movements were full, visual field were full on confrontational test. Facial sensation and strength were normal. hearing was intact to finger rubbing bilaterally. Uvula tongue midline. head turning and shoulder shrug were normal and symmetric.Tongue protrusion into cheek strength was normal. Motor: normal bulk and tone, full strength in the BUE, BLE,  Sensory: normal and symmetric to light touch, Coordination: finger-nose-finger, heel-to-shin bilaterally, no dysmetria Gait and Station: Rising up from seated position without assistance, normal stance,  moderate stride, good arm swing, smooth turning, able to perform tiptoe, and heel walking without difficulty. Tandem gait is mildly unsteady  DIAGNOSTIC DATA (LABS, IMAGING, TESTING) - I reviewed patient records, labs, notes, testing and imaging myself where available.  Lab Results  Component Value Date   WBC 7.0 04/27/2018   HGB 12.9 (L) 04/27/2018   HCT 38.2 04/27/2018   MCV 95 04/27/2018   PLT 146 (L) 04/27/2018      Component Value Date/Time   NA 137 06/03/2018 0956   K 3.8 06/03/2018 0956   CL 96 06/03/2018 0956   CO2 26 06/03/2018 0956   GLUCOSE 116 (H) 06/03/2018 0956   GLUCOSE 99 01/31/2016 1545   BUN 43 (H) 06/03/2018 0956   CREATININE 1.57 (H) 06/03/2018 0956   CREATININE 1.30 (H) 01/31/2016 1545   CALCIUM 9.6 06/03/2018 0956   PROT 6.7 05/04/2015 0818   ALBUMIN 3.6 05/04/2015 0818   AST 29 05/04/2015 0818   ALT 35 05/04/2015 0818   ALKPHOS  84 05/04/2015 0818   BILITOT 2.3 (H) 05/04/2015 0818   GFRNONAA 41 (L) 06/03/2018 0956   GFRAA 47 (L) 06/03/2018 0956   Lab Results  Component Value Date   CHOL 119 (L) 05/04/2015   HDL 58 05/04/2015   LDLCALC 49 05/04/2015   TRIG 62 05/04/2015   CHOLHDL 2.1 05/04/2015    Lab Results  Component Value Date   TSH 3.010 04/16/2018      ASSESSMENT AND PLAN  78 year old male with central apnea on ASV.Data dated 06/03/2018-07/02/2018 shows compliance greater than 4 hours at 77% for 23 days.  Less than 4 hours 6 days 20% for total compliance of 97%.  Average use 6 hours 23 minutes.  Pressure 7 to 15 cm.  Leak 95th percentile 9.6.  AHI 18.4. Patient is due for cardioversion February 26.  Restless leg syndrome on Mirapex      Plan:   Keep on ASV ,  Increase max pressure to 16 -AHI high at 18.6 Continue other settings Follow-up in 4 months Dennie Bible, [facility], [facility], [facility] [address] [place], [place] [address] [phone]

## 2018-07-06 ENCOUNTER — Ambulatory Visit: Payer: Medicare HMO | Admitting: Nurse Practitioner

## 2018-07-06 ENCOUNTER — Encounter (HOSPITAL_COMMUNITY): Payer: Self-pay

## 2018-07-06 ENCOUNTER — Encounter: Payer: Self-pay | Admitting: Nurse Practitioner

## 2018-07-06 VITALS — BP 93/56 | HR 70 | Ht 72.0 in | Wt 184.8 lb

## 2018-07-06 DIAGNOSIS — G4733 Obstructive sleep apnea (adult) (pediatric): Secondary | ICD-10-CM | POA: Diagnosis not present

## 2018-07-06 NOTE — Patient Instructions (Addendum)
Increase max pressure to 16 -AHI high at 18.6 Continue other settings Follow-up in 4 months

## 2018-07-08 ENCOUNTER — Encounter (HOSPITAL_COMMUNITY): Payer: Self-pay

## 2018-07-10 ENCOUNTER — Encounter (HOSPITAL_COMMUNITY): Payer: Self-pay

## 2018-07-13 ENCOUNTER — Telehealth (HOSPITAL_COMMUNITY): Payer: Self-pay | Admitting: *Deleted

## 2018-07-13 ENCOUNTER — Encounter (HOSPITAL_COMMUNITY): Payer: Self-pay

## 2018-07-13 NOTE — Telephone Encounter (Signed)
Patient called to discharge from the program due to upcoming cardioversion. Pt plans to continue exercise on his own. Patient verbalizes understanding of monthly charge through February.

## 2018-07-15 ENCOUNTER — Encounter (HOSPITAL_COMMUNITY): Payer: Self-pay

## 2018-07-17 ENCOUNTER — Encounter (HOSPITAL_COMMUNITY): Payer: Self-pay

## 2018-07-17 ENCOUNTER — Encounter: Payer: Medicare HMO | Admitting: Internal Medicine

## 2018-07-20 ENCOUNTER — Encounter (HOSPITAL_COMMUNITY): Payer: Self-pay

## 2018-07-22 ENCOUNTER — Other Ambulatory Visit: Payer: Self-pay

## 2018-07-22 ENCOUNTER — Ambulatory Visit (HOSPITAL_COMMUNITY)
Admission: RE | Admit: 2018-07-22 | Discharge: 2018-07-22 | Disposition: A | Discharge status: Home / Self Care | Payer: Medicare HMO | Attending: Internal Medicine | Admitting: Internal Medicine

## 2018-07-22 ENCOUNTER — Ambulatory Visit (HOSPITAL_COMMUNITY): Payer: Medicare HMO | Admitting: Registered Nurse

## 2018-07-22 ENCOUNTER — Encounter (HOSPITAL_COMMUNITY): Payer: Self-pay | Admitting: *Deleted

## 2018-07-22 ENCOUNTER — Encounter (HOSPITAL_COMMUNITY)
Admission: RE | Disposition: A | Discharge status: Home / Self Care | Payer: Self-pay | Source: Home / Self Care | Attending: Internal Medicine

## 2018-07-22 ENCOUNTER — Encounter (HOSPITAL_COMMUNITY): Payer: Self-pay

## 2018-07-22 DIAGNOSIS — Z538 Procedure and treatment not carried out for other reasons: Secondary | ICD-10-CM | POA: Insufficient documentation

## 2018-07-22 DIAGNOSIS — I4819 Other persistent atrial fibrillation: Secondary | ICD-10-CM

## 2018-07-22 DIAGNOSIS — Z950 Presence of cardiac pacemaker: Secondary | ICD-10-CM | POA: Diagnosis not present

## 2018-07-22 DIAGNOSIS — I480 Paroxysmal atrial fibrillation: Secondary | ICD-10-CM | POA: Diagnosis present

## 2018-07-22 LAB — POCT I-STAT 4, (NA,K, GLUC, HGB,HCT)
Glucose, Bld: 116 mg/dL — ABNORMAL HIGH (ref 70–99)
HEMATOCRIT: 38 % — AB (ref 39.0–52.0)
Hemoglobin: 12.9 g/dL — ABNORMAL LOW (ref 13.0–17.0)
Potassium: 3.6 mmol/L (ref 3.5–5.1)
Sodium: 139 mmol/L (ref 135–145)

## 2018-07-22 SURGERY — CANCELLED PROCEDURE

## 2018-07-22 NOTE — H&P (Signed)
  EP Attending  David [name] presents today for DCCV. He spontaneously went back to NSR on 2/18. DCCV has been cancelled and he will be discharged home.  [name].D.

## 2018-07-22 NOTE — Progress Notes (Signed)
Patient arrived to Endoscopy with St. Jude pacemaker. Patient in NSR per device rep. MD made aware. Patient's procedure cancelled prior to entering procedure room.

## 2018-07-22 NOTE — Interval H&P Note (Signed)
History and Physical Interval Note:  07/22/2018 11:34 AM  David [name]  has presented today for surgery, with the diagnosis of a fib  The various methods of treatment have been discussed with the patient and family. After consideration of risks, benefits and other options for treatment, the patient has consented to  Procedure(s): CARDIOVERSION (N/A) as a surgical intervention .  The patient's history has been reviewed, patient examined, no change in status, stable for surgery.  I have reviewed the patient's chart and labs.  Questions were answered to the patient's satisfaction.     [name]

## 2018-07-24 ENCOUNTER — Encounter (HOSPITAL_COMMUNITY): Payer: Self-pay

## 2018-07-25 DIAGNOSIS — G4731 Primary central sleep apnea: Secondary | ICD-10-CM | POA: Diagnosis not present

## 2018-07-27 ENCOUNTER — Encounter (HOSPITAL_COMMUNITY): Payer: Self-pay

## 2018-07-28 DIAGNOSIS — I129 Hypertensive chronic kidney disease with stage 1 through stage 4 chronic kidney disease, or unspecified chronic kidney disease: Secondary | ICD-10-CM | POA: Diagnosis not present

## 2018-07-28 DIAGNOSIS — Z79899 Other long term (current) drug therapy: Secondary | ICD-10-CM | POA: Diagnosis not present

## 2018-07-28 DIAGNOSIS — I480 Paroxysmal atrial fibrillation: Secondary | ICD-10-CM | POA: Diagnosis not present

## 2018-07-28 DIAGNOSIS — N183 Chronic kidney disease, stage 3 (moderate): Secondary | ICD-10-CM | POA: Diagnosis not present

## 2018-07-29 ENCOUNTER — Encounter (HOSPITAL_COMMUNITY): Payer: Self-pay

## 2018-07-31 ENCOUNTER — Encounter (HOSPITAL_COMMUNITY): Payer: Self-pay

## 2018-08-03 ENCOUNTER — Encounter (HOSPITAL_COMMUNITY): Payer: Self-pay

## 2018-08-05 ENCOUNTER — Encounter (HOSPITAL_COMMUNITY): Payer: Self-pay

## 2018-08-06 ENCOUNTER — Other Ambulatory Visit: Payer: Self-pay

## 2018-08-06 MED ORDER — METOPROLOL TARTRATE 50 MG PO TABS: 25.0000 mg | ORAL_TABLET | Freq: Two times a day (BID) | ORAL | 3 refills | Status: DC

## 2018-08-06 NOTE — Progress Notes (Signed)
Cardiology Office Note   Date:  08/07/2018   ID:  David [name], DOB 02/11/1941, MRN [id number]  PCP:  Lajean Manes, MD    No chief complaint on file.  CAD  Wt Readings from Last 3 Encounters:  08/07/18 182 lb (82.6 kg)  07/06/18 184 lb 12.8 oz (83.8 kg)  06/30/18 180 lb (81.6 kg)       History of Present Illness: David [name] is a 78 y.o. male   with history of CHB s/p pacemaker 2012, CAD (LAD atherectomy 1999), paroxysmal atrial fib (initially identified by pacemaker), mild-moderate AS, chronic diastolic CHF, ascending aortic and aortic root dilation, Gilbert's syndrome, HTN, peripheral neuropathy/RLS, HLD (managed by PCP), spinal stenosis, thrombocytopenia, DM, OSA, CKD III who presents for CHF follow-up.  Last ischemic assessment was in 2012 with PET nuclear test with moderately reduced blood flow but no indication for angiography [facility]). He is originally from [place] but has lived in [place] for many years.  In 06/2017, he was being managed for acute on chronic diastolic CHF in the setting of increased frequency of eating out while traveling. 2D echo 07/08/17 showed EF 50-55%, grade 2 DD, mild-moderate AS, mildly dilated aortic root and moderately dilated ascending aorta, mildly elevated PASP 62mmHg - study recommended to consider TEE if clinically indicated.   He was given flexibility dosing his diuretic.  He was seen by Melina Copa.   He developed AFlutter and cardioversion was planned.  He was in NSR when he went to the procedure.   He has had more swelling in his legs.    He has developed proteinuria, lisinopril was started but caused hypotension.  He has some fatigue with walking.  He does some senior exercises. He did cardiac rehab.  He quit since he thought he could do it on his own.   Denies : Chest pain. Dizziness. Leg edema. Nitroglycerin use. Orthopnea. Palpitations. Paroxysmal nocturnal dyspnea.  Syncope.    He again asked about StemEnhance Ultra to help his  heart.  Will check with PharmD.    No bleeding problems recently.   Past Medical History:  Diagnosis Date  . Aortic stenosis    a.mild-mod by echo 06/2017.  . Ascending aorta dilatation (HCC)    a. mildly dilated aortic root and mod dilated ascending aorta by echo 06/2017.  [name] Chronic diastolic CHF (congestive heart failure) ([place])   . CKD (chronic kidney disease), stage III ([place])   . Complete heart block (HCC)    a. s/p STJ pacemaker in 02/2011 - Dr. [name]  . Coronary artery disease    a. LAD atherectomy 1999.  . Gilbert's syndrome   . Hypertension   . Insomnia, unspecified   . Pacemaker-St Judes 07/16/2011  . PAF (paroxysmal atrial fibrillation) ([place])    a. identified on pacemaker interrogation; on Eliquis.  . Peripheral neuropathy   . Pure hypercholesterolemia   . Restless leg syndrome   . Spermatocele    near left testicle 2 cm  . Spinal stenosis, lumbar   . Thrombocytopenia ([place])    Averaging 140,000 since 2008  . Tubular adenoma 09/2012   Dr. Watt Climes repeat 4 years  . Type II or unspecified type diabetes mellitus without mention of complication, not stated as uncontrolled   . Unspecified sleep apnea     Past Surgical History:  Procedure Laterality Date  . APPENDECTOMY  1957  . CHOLECYSTECTOMY  1988  . CORONARY STENT PLACEMENT    . HEMORROIDECTOMY  1980  . HERNIA REPAIR  Bilateral   . pacermaker  07/16/11   Dr. [name]  . TEE WITHOUT CARDIOVERSION N/A 06/14/2014   Procedure: TRANSESOPHAGEAL ECHOCARDIOGRAM (TEE);  Surgeon: [name], MD;  Location: [facility] ENDOSCOPY;  Service: Cardiovascular;  Laterality: N/A;     Current Outpatient Medications  Medication Sig Dispense Refill  . acetaminophen (TYLENOL) 500 MG tablet Take 1,000 mg by mouth every 6 (six) hours as needed for mild pain.     [name] apixaban (ELIQUIS) 5 MG TABS tablet Take 2.5 mg by mouth 2 (two) times daily.    . cholecalciferol (VITAMIN D) 1000 UNITS tablet Take 1,000 Units by mouth daily.    . Coenzyme Q10 (COQ10)  200 MG CAPS Take 200 mg by mouth daily.    . Cyanocobalamin (VITAMIN B-12) 2500 MCG SUBL Place 2,500 mcg under the tongue daily.     . famotidine (PEPCID) 10 MG tablet Take 10 mg by mouth daily as needed for heartburn or indigestion.    . metoprolol tartrate (LOPRESSOR) 50 MG tablet Take 0.5 tablets (25 mg total) by mouth 2 (two) times daily. 90 tablet 3  . Multiple Vitamin (MULTIVITAMIN) capsule Take 1 capsule by mouth 3 (three) times a week.     . nitroGLYCERIN (NITROSTAT) 0.4 MG SL tablet Place 1 tablet (0.4 mg total) under the tongue every 5 (five) minutes as needed. For chest pain (Patient taking differently: Place 0.4 mg under the tongue every 5 (five) minutes as needed for chest pain. ) 25 tablet 5  . Omega-3 Fatty Acids (FISH OIL) 1000 MG CAPS Take 1,000 mg by mouth daily.    . Polyvinyl Alcohol-Povidone (REFRESH OP) Place 1 drop into both eyes daily as needed (dry eyes).    . potassium chloride SA (K-DUR,KLOR-CON) 20 MEQ tablet Take 20 mEq by mouth daily.    . pramipexole (MIRAPEX) 1 MG tablet Take 2 mg by mouth at bedtime.      . simvastatin (ZOCOR) 10 MG tablet Take 10 mg by mouth every evening.     . torsemide (DEMADEX) 20 MG tablet Take 20 mg by mouth See admin instructions. Take 20 mg by mouth 2 to 3 times daily    . vitamin C (ASCORBIC ACID) 500 MG tablet Take 500 mg by mouth daily.     No current facility-administered medications for this visit.     Allergies:   Benadryl [diphenhydramine hcl]; Antihistamines, chlorpheniramine-type; and Restoril [temazepam]    Social History:  The patient  reports that he has quit smoking. He has never used smokeless tobacco. He reports current alcohol use. He reports that he does not use drugs.   Family History:  The patient's family history includes CAD in his father; Cancer in his mother; Diabetes in his father; Heart attack in his father; Heart disease in his father; Hypertension in his father and sister.    ROS:  Please see the history of  present illness.   Otherwise, review of systems are positive for generalized fatigue.   All other systems are reviewed and negative.    PHYSICAL EXAM: VS:  BP 110/70   Pulse 83   Ht 6' (1.829 m)   Wt 182 lb (82.6 kg)   SpO2 97%   BMI 24.68 kg/m  , BMI Body mass index is 24.68 kg/m. GEN: Well nourished, well developed, in no acute distress  HEENT: normal  Neck: no JVD, carotid bruits, or masses Cardiac: RRR; 2/6 systolic murmur, rubs, or gallops,no edema  Respiratory:  clear to auscultation bilaterally, normal work  of breathing GI: soft, nontender, nondistended, + BS MS: no deformity or atrophy  Skin: warm and dry, no rash Neuro:  Strength and sensation are intact Psych: euthymic mood, full affect    Recent Labs: 04/16/2018: TSH 3.010 04/27/2018: NT-Pro BNP 4,001; Platelets 146 06/03/2018: BUN 43; Creatinine, Ser 1.57 07/22/2018: Hemoglobin 12.9; Potassium 3.6; Sodium 139   Lipid Panel    Component Value Date/Time   CHOL 119 (L) 05/04/2015 0818   TRIG 62 05/04/2015 0818   HDL 58 05/04/2015 0818   CHOLHDL 2.1 05/04/2015 0818   VLDL 12 05/04/2015 0818   LDLCALC 49 05/04/2015 0818     Other studies Reviewed: Additional studies/ records that were reviewed today with results demonstrating: Prior echo reviewed.  I personally reviewed his last echo and felt ejection fraction was in the 50 to 55% range.   ASSESSMENT AND PLAN:  1. Chronic diastolic heart failure: Appears stable.  This was likely worsened by his several weeks in atrial flutter.  He was back in sinus rhythm at last check.  Continue torsemide.  He does have chronic renal insufficiency and electrolytes will have to be followed as well.  Would also look for excess fluid detection in his chest cavity by his pacemaker.  Those measurements would be helpful. 2. DOE: Stable.  Continue regular activity to maintain stamina. 3. Aortic stenosis: Moderate stenosis noted in the past.  It was not noted on the most recent echo in  terms of velocities.  Visually, it did appear that some of the valve leaflets did not open as well. 4. CAD: No angina at this time.  Continue aggressive medical therapy.  LDL 76. 5. AFlutter: Eliquis for stroke prevention.Seems his only symptom of the atrial flutter was increased fatigue.  Continue to monitor.  I also encouraged him to try to get proper sleep as poor sleep has been linked to atrial arrhythmias. 6. Will check with Pharm.D. regarding his stem cell supplement. 7. Leg edema: Encouraged him to elevate his legs and use compression stockings.  I think his leg edema is more from venous insufficiency.  No evidence of right-sided heart failure on prior echocardiogram.   Current medicines are reviewed at length with the patient today.  The patient concerns regarding his medicines were addressed.  The following changes have been made:  No change  Labs/ tests ordered today include:  No orders of the defined types were placed in this encounter.   Recommend 150 minutes/week of aerobic exercise Low fat, low carb, high fiber diet recommended  Disposition:   FU in after he sees Dr. Lovena Le   Signed, Larae Grooms, MD  08/07/2018 8:50 AM    [address] [place], [place], [place]  [address] Phone: [phone]; Fax: [phone]

## 2018-08-07 ENCOUNTER — Encounter (HOSPITAL_COMMUNITY): Payer: Self-pay

## 2018-08-07 ENCOUNTER — Other Ambulatory Visit: Payer: Self-pay

## 2018-08-07 ENCOUNTER — Ambulatory Visit: Payer: Medicare HMO | Admitting: Interventional Cardiology

## 2018-08-07 ENCOUNTER — Encounter: Payer: Self-pay | Admitting: Interventional Cardiology

## 2018-08-07 VITALS — BP 110/70 | HR 83 | Ht 72.0 in | Wt 182.0 lb

## 2018-08-07 DIAGNOSIS — R0609 Other forms of dyspnea: Secondary | ICD-10-CM | POA: Diagnosis not present

## 2018-08-07 DIAGNOSIS — I2510 Atherosclerotic heart disease of native coronary artery without angina pectoris: Secondary | ICD-10-CM | POA: Diagnosis not present

## 2018-08-07 DIAGNOSIS — N189 Chronic kidney disease, unspecified: Secondary | ICD-10-CM | POA: Diagnosis not present

## 2018-08-07 DIAGNOSIS — I5032 Chronic diastolic (congestive) heart failure: Secondary | ICD-10-CM | POA: Diagnosis not present

## 2018-08-07 DIAGNOSIS — I350 Nonrheumatic aortic (valve) stenosis: Secondary | ICD-10-CM | POA: Diagnosis not present

## 2018-08-07 DIAGNOSIS — R600 Localized edema: Secondary | ICD-10-CM | POA: Diagnosis not present

## 2018-08-07 DIAGNOSIS — R0600 Dyspnea, unspecified: Secondary | ICD-10-CM

## 2018-08-07 DIAGNOSIS — I4892 Unspecified atrial flutter: Secondary | ICD-10-CM

## 2018-08-07 NOTE — Patient Instructions (Signed)
Medication Instructions:  Your physician recommends that you continue on your current medications as directed. Please refer to the Current Medication list given to you today.  If you need a refill on your cardiac medications before your next appointment, please call your pharmacy.   Lab work: None Ordered  If you have labs (blood work) drawn today and your tests are completely normal, you will receive your results only by: [name] MyChart Message (if you have MyChart) OR . A paper copy in the mail If you have any lab test that is abnormal or we need to change your treatment, we will call you to review the results.  Testing/Procedures: None ordered  Follow-Up: At [facility], you and your health needs are our priority.  As part of our continuing mission to provide you with exceptional heart care, we have created designated Provider Care Teams.  These Care Teams include your primary Cardiologist (physician) and Advanced Practice Providers (APPs -  Physician Assistants and Nurse Practitioners) who all work together to provide you with the care you need, when you need it. . You will need a follow up appointment in November 2020.  Please call our office 2 months in advance to schedule this appointment.  You may see Casandra Doffing, MD or one of the following Advanced Practice Providers on your designated Care Team:   . Lyda Jester, PA-C . Dayna Dunn, PA-C . Ermalinda Barrios, PA-C  Any Other Special Instructions Will Be Listed Below (If Applicable).

## 2018-08-07 NOTE — Progress Notes (Signed)
Detailed message left stating patient can try stem cell supplement at a low dose and instructed him to watch for S/Sx of bleeding. Instructed patient to call back with any questions.

## 2018-08-10 ENCOUNTER — Encounter (HOSPITAL_COMMUNITY): Payer: Self-pay

## 2018-08-12 ENCOUNTER — Encounter (HOSPITAL_COMMUNITY): Payer: Self-pay

## 2018-08-14 ENCOUNTER — Encounter (HOSPITAL_COMMUNITY): Payer: Self-pay

## 2018-08-17 ENCOUNTER — Encounter (HOSPITAL_COMMUNITY): Payer: Self-pay

## 2018-08-19 ENCOUNTER — Encounter (HOSPITAL_COMMUNITY): Payer: Self-pay

## 2018-08-20 ENCOUNTER — Other Ambulatory Visit: Payer: Self-pay

## 2018-08-20 ENCOUNTER — Ambulatory Visit (INDEPENDENT_AMBULATORY_CARE_PROVIDER_SITE_OTHER): Payer: Medicare HMO | Admitting: *Deleted

## 2018-08-20 DIAGNOSIS — I442 Atrioventricular block, complete: Secondary | ICD-10-CM

## 2018-08-20 LAB — CUP PACEART REMOTE DEVICE CHECK
Battery Remaining Longevity: 16 mo
Battery Remaining Percentage: 15 %
Brady Statistic AP VP Percent: 97 %
Brady Statistic AP VS Percent: 1 %
Brady Statistic AS VP Percent: 2.6 %
Brady Statistic AS VS Percent: 1 %
Brady Statistic RA Percent Paced: 71 %
Brady Statistic RV Percent Paced: 99 %
Date Time Interrogation Session: 20200326062313
Implantable Lead Implant Date: 20121031
Implantable Lead Location: 753859
Implantable Lead Location: 753860
Implantable Pulse Generator Implant Date: 20121031
Lead Channel Impedance Value: 380 Ohm
Lead Channel Impedance Value: 400 Ohm
Lead Channel Pacing Threshold Amplitude: 1 V
Lead Channel Pacing Threshold Pulse Width: 0.5 ms
Lead Channel Pacing Threshold Pulse Width: 0.5 ms
Lead Channel Sensing Intrinsic Amplitude: 0.9 mV
Lead Channel Sensing Intrinsic Amplitude: 12 mV
Lead Channel Setting Pacing Amplitude: 1.25 V
Lead Channel Setting Pacing Amplitude: 1.5 V
Lead Channel Setting Pacing Pulse Width: 0.5 ms
Lead Channel Setting Sensing Sensitivity: 4 mV
MDC IDC LEAD IMPLANT DT: 20121031
MDC IDC MSMT BATTERY VOLTAGE: 2.74 V
MDC IDC MSMT LEADCHNL RA PACING THRESHOLD AMPLITUDE: 0.5 V
Pulse Gen Model: 2210
Pulse Gen Serial Number: 7280216

## 2018-08-21 ENCOUNTER — Encounter (HOSPITAL_COMMUNITY): Payer: Self-pay

## 2018-08-24 ENCOUNTER — Encounter (HOSPITAL_COMMUNITY): Payer: Self-pay

## 2018-08-25 ENCOUNTER — Encounter: Payer: Medicare HMO | Admitting: Internal Medicine

## 2018-08-25 DIAGNOSIS — Z79899 Other long term (current) drug therapy: Secondary | ICD-10-CM | POA: Diagnosis not present

## 2018-08-25 DIAGNOSIS — N183 Chronic kidney disease, stage 3 (moderate): Secondary | ICD-10-CM | POA: Diagnosis not present

## 2018-08-26 ENCOUNTER — Encounter (HOSPITAL_COMMUNITY): Payer: Self-pay

## 2018-08-27 ENCOUNTER — Encounter: Payer: Self-pay | Admitting: Cardiology

## 2018-08-27 DIAGNOSIS — G4731 Primary central sleep apnea: Secondary | ICD-10-CM | POA: Diagnosis not present

## 2018-08-27 NOTE — Progress Notes (Signed)
Remote pacemaker transmission.   

## 2018-08-28 ENCOUNTER — Encounter (HOSPITAL_COMMUNITY): Payer: Self-pay

## 2018-08-31 ENCOUNTER — Encounter (HOSPITAL_COMMUNITY): Payer: Self-pay

## 2018-09-02 ENCOUNTER — Encounter (HOSPITAL_COMMUNITY): Payer: Self-pay

## 2018-09-04 ENCOUNTER — Encounter (HOSPITAL_COMMUNITY): Payer: Self-pay

## 2018-09-07 ENCOUNTER — Encounter (HOSPITAL_COMMUNITY): Payer: Self-pay

## 2018-09-09 ENCOUNTER — Encounter (HOSPITAL_COMMUNITY): Payer: Self-pay

## 2018-09-11 ENCOUNTER — Encounter (HOSPITAL_COMMUNITY): Payer: Self-pay

## 2018-09-14 ENCOUNTER — Encounter (HOSPITAL_COMMUNITY): Payer: Self-pay

## 2018-09-16 ENCOUNTER — Encounter (HOSPITAL_COMMUNITY): Payer: Self-pay

## 2018-09-18 ENCOUNTER — Encounter (HOSPITAL_COMMUNITY): Payer: Self-pay

## 2018-09-21 ENCOUNTER — Encounter (HOSPITAL_COMMUNITY): Payer: Self-pay

## 2018-09-23 ENCOUNTER — Telehealth: Payer: Self-pay | Admitting: Internal Medicine

## 2018-09-23 ENCOUNTER — Encounter (HOSPITAL_COMMUNITY): Payer: Self-pay

## 2018-09-23 NOTE — Telephone Encounter (Signed)
New message     Spoke with pt about appt on 05.05.20 with Dr. Lovena Le. Pt will do a doxemity video call, pt smart phone number is listed in appt notes.      Virtual Visit Pre-Appointment Phone Call  "(Name), I am calling you today to discuss your upcoming appointment. We are currently trying to limit exposure to the virus that causes COVID-19 by seeing patients at home rather than in the office."  1. "What is the BEST phone number to call the day of the visit?" - include this in appointment notes  2. Do you have or have access to (through a family member/friend) a smartphone with video capability that we can use for your visit?" a. If yes - list this number in appt notes as cell (if different from BEST phone #) and list the appointment type as a VIDEO visit in appointment notes b. If no - list the appointment type as a PHONE visit in appointment notes  3. Confirm consent - "In the setting of the current Covid19 crisis, you are scheduled for a (phone or video) visit with your provider on (date) at (time).  Just as we do with many in-office visits, in order for you to participate in this visit, we must obtain consent.  If you'd like, I can send this to your mychart (if signed up) or email for you to review.  Otherwise, I can obtain your verbal consent now.  All virtual visits are billed to your insurance company just like a normal visit would be.  By agreeing to a virtual visit, we'd like you to understand that the technology does not allow for your provider to perform an examination, and thus may limit your provider's ability to fully assess your condition. If your provider identifies any concerns that need to be evaluated in person, we will make arrangements to do so.  Finally, though the technology is pretty good, we cannot assure that it will always work on either your or our end, and in the setting of a video visit, we may have to convert it to a phone-only visit.  In either situation, we  cannot ensure that we have a secure connection.  Are you willing to proceed?" STAFF: Did the patient verbally acknowledge consent to telehealth visit? Document YES/NO here: yes  4. Advise patient to be prepared - "Two hours prior to your appointment, go ahead and check your blood pressure, pulse, oxygen saturation, and your weight (if you have the equipment to check those) and write them all down. When your visit starts, your provider will ask you for this information. If you have an Apple Watch or Kardia device, please plan to have heart rate information ready on the day of your appointment. Please have a pen and paper handy nearby the day of the visit as well."  5. Give patient instructions for MyChart download to smartphone OR Doximity/Doxy.me as below if video visit (depending on what platform provider is using)  6. Inform patient they will receive a phone call 15 minutes prior to their appointment time (may be from unknown caller ID) so they should be prepared to answer    TELEPHONE CALL NOTE  David [name] has been deemed a candidate for a follow-up tele-health visit to limit community exposure during the Covid-19 pandemic. I spoke with the patient via phone to ensure availability of phone/video source, confirm preferred email & phone number, and discuss instructions and expectations.  I reminded David [name] to be prepared with  any vital sign and/or heart rhythm information that could potentially be obtained via home monitoring, at the time of his visit. I reminded David [name] to expect a phone call prior to his visit.  [name] 09/23/2018 10:27 AM   INSTRUCTIONS FOR DOWNLOADING THE MYCHART APP TO SMARTPHONE  - The patient must first make sure to have activated MyChart and know their login information - If Apple, go to [employer] and type in MyChart in the search bar and download the app. If Android, ask patient to go to [employer] and type in Raven in the search  bar and download the app. The app is free but as with any other app downloads, their phone may require them to verify saved payment information or Apple/Android password.  - The patient will need to then log into the app with their MyChart username and password, and select [facility] as their healthcare provider to link the account. When it is time for your visit, go to the MyChart app, find appointments, and click Begin Video Visit. Be sure to Select Allow for your device to access the Microphone and Camera for your visit. You will then be connected, and your provider will be with you shortly.  **If they have any issues connecting, or need assistance please contact MyChart service desk [phone])**  **If using a computer, in order to ensure the best quality for their visit they will need to use either of the following Internet Browsers: [employer], or Google Chrome**  IF USING DOXIMITY or DOXY.ME - The patient will receive a link just prior to their visit by text.     FULL LENGTH CONSENT FOR TELE-HEALTH VISIT   I hereby voluntarily request, consent and authorize [facility] and its employed or contracted physicians, physician assistants, nurse practitioners or other licensed health care professionals (the Practitioner), to provide me with telemedicine health care services (the Services") as deemed necessary by the treating Practitioner. I acknowledge and consent to receive the Services by the Practitioner via telemedicine. I understand that the telemedicine visit will involve communicating with the Practitioner through live audiovisual communication technology and the disclosure of certain medical information by electronic transmission. I acknowledge that I have been given the opportunity to request an in-person assessment or other available alternative prior to the telemedicine visit and am voluntarily participating in the telemedicine visit.  I understand that I have the  right to withhold or withdraw my consent to the use of telemedicine in the course of my care at any time, without affecting my right to future care or treatment, and that the Practitioner or I may terminate the telemedicine visit at any time. I understand that I have the right to inspect all information obtained and/or recorded in the course of the telemedicine visit and may receive copies of available information for a reasonable fee.  I understand that some of the potential risks of receiving the Services via telemedicine include:   Delay or interruption in medical evaluation due to technological equipment failure or disruption;  Information transmitted may not be sufficient (e.g. poor resolution of images) to allow for appropriate medical decision making by the Practitioner; and/or   In rare instances, security protocols could fail, causing a breach of personal health information.  Furthermore, I acknowledge that it is my responsibility to provide information about my medical history, conditions and care that is complete and accurate to the best of my ability. I acknowledge that Practitioner's advice, recommendations, and/or decision  may be based on factors not within their control, such as incomplete or inaccurate data provided by me or distortions of diagnostic images or specimens that may result from electronic transmissions. I understand that the practice of medicine is not an exact science and that Practitioner makes no warranties or guarantees regarding treatment outcomes. I acknowledge that I will receive a copy of this consent concurrently upon execution via email to the email address I last provided but may also request a printed copy by calling the office of [facility].    I understand that my insurance will be billed for this visit.   I have read or had this consent read to me.  I understand the contents of this consent, which adequately explains the benefits and risks of the Services  being provided via telemedicine.   I have been provided ample opportunity to ask questions regarding this consent and the Services and have had my questions answered to my satisfaction.  I give my informed consent for the services to be provided through the use of telemedicine in my medical care  By participating in this telemedicine visit I agree to the above.

## 2018-09-29 ENCOUNTER — Other Ambulatory Visit: Payer: Self-pay

## 2018-09-29 ENCOUNTER — Telehealth (INDEPENDENT_AMBULATORY_CARE_PROVIDER_SITE_OTHER): Payer: Medicare HMO | Admitting: Internal Medicine

## 2018-09-29 DIAGNOSIS — Z950 Presence of cardiac pacemaker: Secondary | ICD-10-CM | POA: Diagnosis not present

## 2018-09-29 DIAGNOSIS — I4891 Unspecified atrial fibrillation: Secondary | ICD-10-CM

## 2018-09-29 NOTE — Progress Notes (Signed)
Electrophysiology TeleHealth Note   Due to national recommendations of social distancing due to COVID 19, an audio/video telehealth visit is felt to be most appropriate for this patient at this time.  See MyChart message from today for the patient's consent to telehealth for [facility].   Date:  09/29/2018   ID:  David [name], DOB [birth year], MRN [id number]  Location: patient's home  Provider location: [address] [place]  Evaluation Performed: Follow-up visit  PCP:  David Manes, MD  Cardiologist:  David Grooms, MD Electrophysiologist:  Dr David [name]  Chief Complaint:  "I am feeling ok for the most part".   History of Present Illness:    David [name] is a 78 y.o. male who presents via audio/video conferencing for a telehealth visit today. He is a pleasant 78 yo man with CAD, s/p stenting, PAF, s/p DCCV, CHB, s/p PPM insertion, HTN, and peripheral vascular disease. He continues to have problems with peripheral edema. He is taking torsemide and potassium daily.  The patient is otherwise without complaint today. The patient denies symptoms of fevers, chills, cough, or new SOB worrisome for COVID 19.  Past Medical History:  Diagnosis Date  . Aortic stenosis    a.mild-mod by echo 06/2017.  . Ascending aorta dilatation (HCC)    a. mildly dilated aortic root and mod dilated ascending aorta by echo 06/2017.  [name] Chronic diastolic CHF (congestive heart failure) ([facility])   . CKD (chronic kidney disease), stage III ([facility])   . Complete heart block (HCC)    a. s/p STJ pacemaker in 02/2011 - Dr. [name]  . Coronary artery disease    a. LAD atherectomy 1999.  . Gilbert's syndrome   . Hypertension   . Insomnia, unspecified   . Pacemaker-St Judes 07/16/2011  . PAF (paroxysmal atrial fibrillation) ([name])    a. identified on pacemaker interrogation; on Eliquis.  . Peripheral neuropathy   . Pure hypercholesterolemia   . Restless leg syndrome   . Spermatocele    near left  testicle 2 cm  . Spinal stenosis, lumbar   . Thrombocytopenia ([facility])    Averaging 140,000 since 2008  . Tubular adenoma 09/2012   Dr. Watt Climes repeat 4 years  . Type II or unspecified type diabetes mellitus without mention of complication, not stated as uncontrolled   . Unspecified sleep apnea     Past Surgical History:  Procedure Laterality Date  . APPENDECTOMY  1957  . CHOLECYSTECTOMY  1988  . CORONARY STENT PLACEMENT    . HEMORROIDECTOMY  1980  . HERNIA REPAIR Bilateral   . pacermaker  07/16/11   Dr. [name]  . TEE WITHOUT CARDIOVERSION N/A 06/14/2014   Procedure: TRANSESOPHAGEAL ECHOCARDIOGRAM (TEE);  Surgeon: [name], MD;  Location: [facility] ENDOSCOPY;  Service: Cardiovascular;  Laterality: N/A;    Current Outpatient Medications  Medication Sig Dispense Refill  . acetaminophen (TYLENOL) 500 MG tablet Take 1,000 mg by mouth every 6 (six) hours as needed for mild pain.     [name] apixaban (ELIQUIS) 5 MG TABS tablet Take 2.5 mg by mouth 2 (two) times daily.    . cholecalciferol (VITAMIN D) 1000 UNITS tablet Take 1,000 Units by mouth daily.    . Coenzyme Q10 (COQ10) 200 MG CAPS Take 200 mg by mouth daily.    . Cyanocobalamin (VITAMIN B-12) 2500 MCG SUBL Place 2,500 mcg under the tongue daily.     . famotidine (PEPCID) 10 MG tablet Take 10 mg by mouth daily as needed for  heartburn or indigestion.    . metoprolol tartrate (LOPRESSOR) 50 MG tablet Take 0.5 tablets (25 mg total) by mouth 2 (two) times daily. 90 tablet 3  . Multiple Vitamin (MULTIVITAMIN) capsule Take 1 capsule by mouth 3 (three) times a week.     . nitroGLYCERIN (NITROSTAT) 0.4 MG SL tablet Place 1 tablet (0.4 mg total) under the tongue every 5 (five) minutes as needed. For chest pain (Patient taking differently: Place 0.4 mg under the tongue every 5 (five) minutes as needed for chest pain. ) 25 tablet 5  . Omega-3 Fatty Acids (FISH OIL) 1000 MG CAPS Take 1,000 mg by mouth daily.    . Polyvinyl Alcohol-Povidone (REFRESH OP) Place 1  drop into both eyes daily as needed (dry eyes).    . potassium chloride SA (K-DUR,KLOR-CON) 20 MEQ tablet Take 20 mEq by mouth daily.    . pramipexole (MIRAPEX) 1 MG tablet Take 2 mg by mouth at bedtime.      . simvastatin (ZOCOR) 10 MG tablet Take 10 mg by mouth every evening.     . torsemide (DEMADEX) 20 MG tablet Take 20 mg by mouth See admin instructions. Take 20 mg by mouth 2 to 3 times daily    . vitamin C (ASCORBIC ACID) 500 MG tablet Take 500 mg by mouth daily.     No current facility-administered medications for this visit.     Allergies:   Benadryl [diphenhydramine hcl]; Antihistamines, chlorpheniramine-type; and Restoril [temazepam]   Social History:  The patient  reports that he has quit smoking. He has never used smokeless tobacco. He reports current alcohol use. He reports that he does not use drugs.   Family History:  The patient's  family history includes CAD in his father; Cancer in his mother; Diabetes in his father; Heart attack in his father; Heart disease in his father; Hypertension in his father and sister.   ROS:  Please see the history of present illness.   All other systems are personally reviewed and negative.    Exam:    Vital Signs:    Well appearing, alert and conversant, regular work of breathing,  good skin color Eyes- anicteric, neuro- grossly intact, skin- no apparent rash or lesions or cyanosis, mouth- oral mucosa is pink   Labs/Other Tests and Data Reviewed:    Recent Labs: 04/16/2018: TSH 3.010 04/27/2018: NT-Pro BNP 4,001; Platelets 146 06/03/2018: BUN 43; Creatinine, Ser 1.57 07/22/2018: Hemoglobin 12.9; Potassium 3.6; Sodium 139   Wt Readings from Last 3 Encounters:  08/07/18 182 lb (82.6 kg)  07/06/18 184 lb 12.8 oz (83.8 kg)  06/30/18 180 lb (81.6 kg)     Other studies personally reviewed: Additional studies/ records that were reviewed today include:   Last device remote is reviewed from [place] PDF dated 08/20/18 which reveals normal  device function, no arrhythmias    ASSESSMENT & PLAN:    1.  Persistent atrial fib - he appears to be maintaining NSR. He will continue his current meds. 2. PPM - his St. Jude DDD PM is workng normally. He will continue his current meds. 3. CAD - he denies anginal symptoms.  4. COVID 19 screen The patient denies symptoms of COVID 19 at this time.  The importance of social distancing was discussed today.  Follow-up:  6 months with me Next remote: 6/20  Current medicines are reviewed at length with the patient today.   The patient does not have concerns regarding his medicines.  The following changes were made  today:  none  Labs/ tests ordered today include: none No orders of the defined types were placed in this encounter.    Patient Risk:  after full review of this patients clinical status, I feel that they are at moderate risk at this time.  Today, I have spent 25 minutes with the patient with telehealth technology discussing all of the above .    Signed, [name], MD  09/29/2018 10:30 AM     [facility] [address] [place] [address] [phone] (office) [phone] (fax)

## 2018-10-06 DIAGNOSIS — H52203 Unspecified astigmatism, bilateral: Secondary | ICD-10-CM | POA: Diagnosis not present

## 2018-10-06 DIAGNOSIS — H353131 Nonexudative age-related macular degeneration, bilateral, early dry stage: Secondary | ICD-10-CM | POA: Diagnosis not present

## 2018-10-06 DIAGNOSIS — H43813 Vitreous degeneration, bilateral: Secondary | ICD-10-CM | POA: Diagnosis not present

## 2018-10-06 DIAGNOSIS — H04123 Dry eye syndrome of bilateral lacrimal glands: Secondary | ICD-10-CM | POA: Diagnosis not present

## 2018-11-09 ENCOUNTER — Telehealth: Payer: Self-pay | Admitting: Neurology

## 2018-11-09 NOTE — Telephone Encounter (Signed)
Due to current COVID 19 pandemic, our office is severely reducing in office visits until further notice, in order to minimize the risk to our patients and healthcare providers.   Called patient to discuss a virtual visit for his 6/22 appointment. No answer, but LVM with my contact information and requested patient call back. Patient can be offered a virtual visit only at this time, or will need to be rescheduled to July when we fully re-open.

## 2018-11-09 NOTE — Telephone Encounter (Signed)
Patient returned my call and accepted a virtual visit through [facility] for his 6/22 appointment. I have sent patient the information through his MyChart account. Patient verbalized understanding of the process.  Pt understands that although there may be some limitations with this type of visit, we will take all precautions to reduce any security or privacy concerns.  Pt understands that this will be treated like an in office visit and we will file with pt's insurance, and there may be a patient responsible charge related to this service.

## 2018-11-11 ENCOUNTER — Encounter: Payer: Self-pay | Admitting: Neurology

## 2018-11-11 NOTE — Addendum Note (Signed)
Addended by: Darleen Crocker on: 11/11/2018 10:24 AM   Modules accepted: Orders

## 2018-11-11 NOTE — Telephone Encounter (Signed)
Called the patient to review their chart and made sure that everything was up to date. Patient informed they received the e-mail/text message for the visit. Instructed to make sure they hold on to the e-mail/text for the upcoming appointment as it is necessary to access their appointment. Instructed the patient that apx 30 min prior to the appointment the front staff will contact them to make sure they are ready to go for their appointment in case there is any need for troubleshooting it can be completed prior to the appointment time. Reminded the patient once more that this is treated as a Office visit and the patient must be prepared for the visit and ready at the time of their appointment preferably in a well lit area where they have good connection for the visit. Pt verbalized understanding.  I reviewed the patient about the mychart video process.

## 2018-11-13 ENCOUNTER — Other Ambulatory Visit: Payer: Self-pay | Admitting: Interventional Cardiology

## 2018-11-16 ENCOUNTER — Telehealth (INDEPENDENT_AMBULATORY_CARE_PROVIDER_SITE_OTHER): Payer: Medicare HMO | Admitting: Neurology

## 2018-11-16 ENCOUNTER — Encounter: Payer: Self-pay | Admitting: Neurology

## 2018-11-16 DIAGNOSIS — I5030 Unspecified diastolic (congestive) heart failure: Secondary | ICD-10-CM | POA: Diagnosis not present

## 2018-11-16 DIAGNOSIS — Z950 Presence of cardiac pacemaker: Secondary | ICD-10-CM | POA: Diagnosis not present

## 2018-11-16 DIAGNOSIS — G4731 Primary central sleep apnea: Secondary | ICD-10-CM | POA: Diagnosis not present

## 2018-11-16 DIAGNOSIS — G2581 Restless legs syndrome: Secondary | ICD-10-CM

## 2018-11-16 DIAGNOSIS — Z7189 Other specified counseling: Secondary | ICD-10-CM | POA: Insufficient documentation

## 2018-11-16 DIAGNOSIS — I442 Atrioventricular block, complete: Secondary | ICD-10-CM | POA: Diagnosis not present

## 2018-11-16 NOTE — Progress Notes (Signed)
SLEEP MEDICINE CLINIC   Provider:  [name], M D  Primary Care Physician:  Lajean Manes, MD   Referring Provider: Lajean Manes, MD   Virtual Visit via Video Note  I connected with [name] on 11/16/18 at  1:30 PM EDT by a video enabled telemedicine application and verified that I am speaking with the correct person using two identifiers.  Location: Patient: at home  Provider: at [facility]    I discussed the limitations of evaluation and management by telemedicine and the availability of in person appointments. The patient expressed understanding and agreed to proceed.   Interval history : has to see a nephrologist now with CKD 3 , low BP usually- was asked to d/c Neurontin/ gabapentin. CHF with pacemaker - Echocardiogram will need repeated, he has a leaking heart valve- aortic -may need repair. He gets easily short of breath. Not lightheaded, but fatigued.   \  History of Present Illness: 12-31-2017, RV with Mrs Boody present. Had abnormal creatinine levels, has complete heart block, pacemaker since 2012 ( after bradycardia), later had a CVA, recently  had severe water retention. Gained 20 pounds water weight while in [place] this summer when he didn't take his diuretics.    He uses ASV with success and noticed more alertness. This after failing first CPAP and then BiPAP.  He is chronically anticoagulated, has a pacemaker, and used to have more restless leg syndrome with neuropathy.  Controlled however on the Mirapex.  The patient did the best on an ASV machine with 15 cm maximum pressure support, 7 cm with a minimum pressure support and 4 cm expiratory range.  Heated humidity and a Respironics DreamWear full facemask was suggested.   He continues to follow with Dr. Lovena Le for electro- physiology. Mr. Ono also reminded me that he has been taking Mirapex for about 17 years ever since it came to market for the treatment of restless legs.  His restless legs follow the  development of neuropathy and he was interested to see if he still needs Mirapex, if the dose could be reduced or if other medications could supplement its effect.  We discussed Neurontin and for this reason the generic form of gabapentin I looked at his metabolic panels which revealed for February of this year a decrease in creatinine clearance indicative of CKD stage II.    Observations/Objective:  ASV reviewed - average compliance is 28/30 days but only 3 hours. He has anemia.  AHI 11.8?h but minimal air leaks on ASV.   How likely are you to doze in the following situations: 0 = not likely, 1 = slight chance, 2 = moderate chance, 3 = high chance  Sitting and Reading? 1 Watching Television? 1 Sitting inactive in a public place (theater or meeting)? Lying down in the afternoon when circumstances permit? 2 Sitting and talking to someone? Sitting quietly after lunch without alcohol? 1 In a car, while stopped for a few minutes in traffic? As a passenger in a car for an hour without a break?  Total = 5, FSS 47 /63 points.      Assessment and Plan: He reports feeling very cold - recheck for anemia. Pramipexole works well for his RLS and may be the prenatal vitamins are helping.  He feels his sleep is less restorative than 6-12 month ago. Sleep is not of the same quality.     Follow Up Instructions: I like to follow up in 6 month with new compliance data. The residual AHI  can be high due to CHF.     I discussed the assessment and treatment plan with the patient. The patient was provided an opportunity to ask questions and all were answered. The patient agreed with the plan and demonstrated an understanding of the instructions.   The patient was advised to call back or seek an in-person evaluation if the symptoms worsen or if the condition fails to improve as anticipated.  I provided 16 minutes of non-face-to-face time during this encounter.   [name], MD    HPI:  Dr.   Mendy Lapinsky is a 78 y.o. male , born in [place] and seen here as in a referral/ revisit  from Dr. [name] for a new sleep evaluation . Wife has reported irregular breathing at night, gasping and mild snoring. He underwent a 2007 sleep study at [facility], with an AHI of 1.0/hr. Bradycardia. No CPAP was necessary.  He has been treated for RLS since 1999, on sinemet , Klonopin, Requip and now Mirapex ,now at 2 mg . He later developed heart block and in 2012 a cardiac pacemaker was implanted.He now gets more restless during the night. He has no insomnia, falls asleep easily but has 1-2 bathroom breaks. He talks in his sleep. He has kicked a ball during one of his dreams and fell out of bed. He has neuropathy, too.   Sleep habits are as follows: his bedtime is usually between 11 pm and 1 AM, he likes to work on his computer late.  He falls asleep promptly, in his cool, dark and quiet environment. He has to urinate once at 4 AM, and goes back to sleep. He sleeps until 5 AM, and stays in bed a little longer , rising at 6.30 AM. He feels refreshed , he can concentrate, focus.  He does have vivid dreams sometimes, but does not always remember them. He often sleeps supine, and in that position produces most apnea and irregular breathing. His wife has confirmed that the supine sleep position seems to predispose him to apnea. He sleeps on 2 pillows. He is restless- he likes a hard, firm surface. He likes a weighted blanket.   Sleep medical history and family sleep history:   Parents had no sleep disorders. He developed with RLS in 1994, in [place], and Bradycardia in 1999 in [place] [address], [place].  Social history: father of Dr. Kris Hartmann, engineering degree PhD from [employer]. No shift work history. No tobacco use, caffeine use : 1 in AM, 1 in PM. Alcohol- one a week.     08-12-2017, Dr. Kris Hartmann reports he slept well during both sleep studies.  He underwent a baseline polysomnogram on 31 March 2017, at the time he  was diagnosed with a moderate severe sleep apnea his AHI was 32/h without REM sleep accentuation but worse while in supine sleep position with an AHI of 44.5.  The time spent below 89% oxygen saturation equal 28 minutes he had multiple respiratory arousals but the overall sleep efficiency was still very good at 87.2% of the recorded time.  Study was followed by a CPAP titration which reduced his AHI under a pressure of 13 cmH2O to 2.9 he did not sleep better as we tried another slightly higher pressure of 14 cmH2O there were no apneas seen but the patient only slept about 50% of the recorded time.  He did not have nocturia, he did have multiple spontaneous arousals.  He was fitted with a full facemask after he could not get well  used to a nasal interface.  I ordered an auto titration CPAP with a limited pressure range between 8 and 14 cmH2O and have today the first opportunity to look at a download.  He is 100% compliant with CPAP over the last 30 days, his residual AHI however is 13.3/h 4.6 of the central and 6.6 of these apneas obstructive in origin.  95th percentile pressure was 14 cmH2O, air leaks have been significantly reduced since he is using a full facemask.  We are meeting today as the patient and his wife have noticed his sleep to be more sound, less fragmented and overall much less restless.  Mrs. Chavira has not noted him to stop breathing and also has observed that the snoring is controlled which affects her sleep as well. However I would like to invite Dr. Kris Hartmann back for a BiPAP or ASV titration.  My concern is at higher CPAP pressures will make central apneas emerged more and at lower CPAP pressures do not treat the obstructive apneas enough.  I have already spoken to the sleep lab manager and we will be able to do a titration study within the next 14 days.  Result of ASV titration. Central Sleep Apnea, emerging under CPAP and BiPAP and finally controlled under ASV after other modalities failed.     PLANS/RECOMMENDATIONS:  We will place this patient on ASV- 15 maximum pressure support, 7 minimum pressure support and 4 cm EEP. Heated humidity and a Respironics Dream wear FFM in size small were added to the order. [name], M.D.    05 09-2017   12-31-2017, RV with Mrs Hensch present. Had abnormal creatinine levels, has complete heart block, pacemaker since 2012 ( after bradycardia), later had a CVA, recently  had severe water retention. Gained 20 pounds water weight while in [place] this summer when he didn't take his diuretics.    He uses ASV with success and noticed more alertness. This after failing first CPAP and then BiPAP.  He is chronically anticoagulated, has a pacemaker, and used to have more restless leg syndrome with neuropathy.  Controlled however on the Mirapex.  The patient did the best on an ASV machine with 15 cm maximum pressure support, 7 cm with a minimum pressure support and 4 cm expiratory range.  Heated humidity and a Respironics DreamWear full facemask was suggested.   He continues to follow with Dr. Lovena Le for electro- physiology. Mr. Bordonaro also reminded me that he has been taking Mirapex for about 17 years ever since it came to market for the treatment of restless legs.  His restless legs follow the development of neuropathy and he was interested to see if he still needs Mirapex, if the dose could be reduced or if other medications could supplement its effect.  We discussed Neurontin and for this reason the generic form of gabapentin I looked at his metabolic panels which revealed for February of this year a decrease in creatinine clearance indicative of CKD stage II.     Review of Systems: Out of a complete 14 system review, the patient complains of only the following symptoms, and all other reviewed systems are negative.   Epworth score  7 since ASV from 14 , Fatigue severity score on ASV - 36 from 48  , response to ASV is positive.   Social History    Socioeconomic History   Marital status: Married    Spouse name: Not on file   Number of children: Not on file   Years  of education: Not on file   Highest education level: Not on file  Occupational History   Not on file  Social Needs   Financial resource strain: Not on file   Food insecurity    Worry: Not on file    Inability: Not on file   Transportation needs    Medical: Not on file    Non-medical: Not on file  Tobacco Use   Smoking status: Former Smoker   Smokeless tobacco: Never Used  Substance and Sexual Activity   Alcohol use: Yes    Comment: occasional glass of wine   Drug use: No   Sexual activity: Not on file  Lifestyle   Physical activity    Days per week: Not on file    Minutes per session: Not on file   Stress: Not on file  Relationships   Social connections    Talks on phone: Not on file    Gets together: Not on file    Attends religious service: Not on file    Active member of club or organization: Not on file    Attends meetings of clubs or organizations: Not on file    Relationship status: Not on file   Intimate partner violence    Fear of current or ex partner: Not on file    Emotionally abused: Not on file    Physically abused: Not on file    Forced sexual activity: Not on file  Other Topics Concern   Not on file  Social History Narrative   Not on file    Family History  Problem Relation Age of Onset   Cancer Mother        Small cell lung cancer   Diabetes Father        DM   CAD Father    Hypertension Father    Heart disease Father    Heart attack Father    Hypertension Sister     Past Medical History:  Diagnosis Date   Aortic stenosis    a.mild-mod by echo 06/2017.   Ascending aorta dilatation (HCC)    a. mildly dilated aortic root and mod dilated ascending aorta by echo 06/2017.   Chronic diastolic CHF (congestive heart failure) (HCC)    CKD (chronic kidney disease), stage III (HCC)    Complete  heart block (HCC)    a. s/p STJ pacemaker in 02/2011 - Dr. [name]   Coronary artery disease    a. LAD atherectomy 1999.   Gilbert's syndrome    Hypertension    Insomnia, unspecified    Pacemaker-[employer] [facility] 07/16/2011   PAF (paroxysmal atrial fibrillation) ([place])    a. identified on pacemaker interrogation; on Eliquis.   Peripheral neuropathy    Pure hypercholesterolemia    Restless leg syndrome    Spermatocele    near left testicle 2 cm   Spinal stenosis, lumbar    Thrombocytopenia ([place])    Averaging 140,000 since 2008   Tubular adenoma 09/2012   Dr. Watt Climes repeat 4 years   Type II or unspecified type diabetes mellitus without mention of complication, not stated as uncontrolled    Unspecified sleep apnea     Past Surgical History:  Procedure Laterality Date   [place] Bilateral    pacermaker  07/16/11   Dr. [name]   TEE WITHOUT CARDIOVERSION N/A 06/14/2014   Procedure: TRANSESOPHAGEAL ECHOCARDIOGRAM (  TEE);  Surgeon: [name], MD;  Location: [facility] ENDOSCOPY;  Service: Cardiovascular;  Laterality: N/A;    Current Outpatient Medications  Medication Sig Dispense Refill   acetaminophen (TYLENOL) 500 MG tablet Take 1,000 mg by mouth every 6 (six) hours as needed for mild pain.      apixaban (ELIQUIS) 5 MG TABS tablet Take 2.5 mg by mouth 2 (two) times daily.     cholecalciferol (VITAMIN D) 1000 UNITS tablet Take 1,000 Units by mouth daily.     Coenzyme Q10 (COQ10) 200 MG CAPS Take 200 mg by mouth daily.     Cyanocobalamin (VITAMIN B-12) 2500 MCG SUBL Place 2,500 mcg under the tongue daily.      metoprolol tartrate (LOPRESSOR) 50 MG tablet Take 0.5 tablets (25 mg total) by mouth 2 (two) times daily. 90 tablet 3   Multiple Vitamin (MULTIVITAMIN) capsule Take 1 capsule by mouth 3 (three) times a week.      nitroGLYCERIN (NITROSTAT) 0.4 MG SL tablet Place 1  tablet (0.4 mg total) under the tongue every 5 (five) minutes as needed. For chest pain (Patient taking differently: Place 0.4 mg under the tongue every 5 (five) minutes as needed for chest pain. ) 25 tablet 5   Omega-3 Fatty Acids (FISH OIL) 1000 MG CAPS Take 1,000 mg by mouth daily.     Polyvinyl Alcohol-Povidone (REFRESH OP) Place 1 drop into both eyes daily as needed (dry eyes).     potassium chloride SA (K-DUR,KLOR-CON) 20 MEQ tablet Take 20 mEq by mouth daily as needed.      pramipexole (MIRAPEX) 1 MG tablet Take 2 mg by mouth at bedtime.       simvastatin (ZOCOR) 10 MG tablet Take 10 mg by mouth every evening.      torsemide (DEMADEX) 20 MG tablet Take 20 mg by mouth See admin instructions. Take 20 mg by mouth 2 to 3 times daily     vitamin C (ASCORBIC ACID) 500 MG tablet Take 500 mg by mouth daily.     No current facility-administered medications for this visit.     Allergies as of 11/16/2018 - Review Complete 11/11/2018  Allergen Reaction Noted   Benadryl [diphenhydramine hcl] Other (See Comments) 04/18/2011   Lisinopril  11/11/2018   Antihistamines, chlorpheniramine-type Palpitations 08/15/2014   Restoril [temazepam] Other (See Comments) 08/09/2013    Vitals: There were no vitals taken for this visit. Last Weight:  Wt Readings from Last 1 Encounters:  08/07/18 182 lb (82.6 kg)   KGY:JEHUD is no height or weight on file to calculate BMI.     Last Height:   Ht Readings from Last 1 Encounters:  08/07/18 6' (1.829 m)   Last visit with me.   General: The patient is awake, alert and appears not in acute distress. The patient is well groomed. Head: Normocephalic, atraumatic. Neck is supple. Mallampati 2,  neck circumference: 15.25. Nasal airflow patent ,  Cardiovascular:  irregular rate and rhythm,  Skipped beats -without carotid bruit, and without distended neck veins. Respiratory: Lungs are clear to auscultation. Skin:  Without evidence of edema, or rash Trunk:  BMI is 24. The patient's posture is erect   Neurologic exam : The patient is awake and alert, oriented to place and time.   Memory subjective described as intact.  Speech is fluent,  without  dysarthria, dysphonia or aphasia.  Mood and affect are appropriate.  Cranial nerves: Pupils are equal and briskly reactive to light. Funduscopic exam deferred. Extraocular movements  in  vertical and horizontal planes intact and without nystagmus.  Visual fields by finger perimetry are intact.  Facial sensation intact to fine touch. Facial motor strength is symmetric and tongue and uvula move midline. Shoulder shrug was symmetrical.  Equal. symmetric motor tone and bulk.     1) atrial fibrillation, pacemaker patient - on Eloquis-  presenting with EDS and reduced nocturnal sleep time. Advancing his bed time and eliminating screen light in the hour prior. Central apnea - on ASV with good success. atria fib related central apnea.   2) neuropathy and RLS , has CKD 2-  Therefore not adding Neurontin, and on to Mirapex.   The patient was advised of the nature of the diagnosed disorder , the treatment options and the  risks for general health and wellness arising from not treating the condition.   I spent more than 30 minutes of face to face time with the patient. Greater than 50% of time was spent in counseling and coordination of care. We have discussed the diagnosis and differential and I answered the patient's questions.    Plan:  Treatment plan and additional workup :  Keep on ASV ,16 max / 7 minimum pressure support and 4 cm EEP , FFM- residual AHI 11.8/h. Compliance is 33% . Rv in 6 Month and then yearly.    [name], MD 0/99/8338, 2:50 PM  Certified in Neurology by ABPN Certified in [place] by [facility] [address] [place], [place] [address]

## 2018-11-19 ENCOUNTER — Ambulatory Visit (INDEPENDENT_AMBULATORY_CARE_PROVIDER_SITE_OTHER): Payer: Medicare HMO | Admitting: *Deleted

## 2018-11-19 DIAGNOSIS — I442 Atrioventricular block, complete: Secondary | ICD-10-CM | POA: Diagnosis not present

## 2018-11-19 LAB — CUP PACEART REMOTE DEVICE CHECK
Battery Remaining Longevity: 9 mo
Battery Remaining Percentage: 8 %
Battery Voltage: 2.69 V
Brady Statistic AP VP Percent: 98 %
Brady Statistic AP VS Percent: 1 %
Brady Statistic AS VP Percent: 2.2 %
Brady Statistic AS VS Percent: 1 %
Brady Statistic RA Percent Paced: 88 %
Brady Statistic RV Percent Paced: 99 %
Date Time Interrogation Session: 20200625085400
Implantable Lead Implant Date: 20121031
Implantable Lead Implant Date: 20121031
Implantable Lead Location: 753859
Implantable Lead Location: 753860
Implantable Pulse Generator Implant Date: 20121031
Lead Channel Impedance Value: 360 Ohm
Lead Channel Impedance Value: 400 Ohm
Lead Channel Pacing Threshold Amplitude: 0.625 V
Lead Channel Pacing Threshold Amplitude: 1 V
Lead Channel Pacing Threshold Pulse Width: 0.5 ms
Lead Channel Pacing Threshold Pulse Width: 0.5 ms
Lead Channel Sensing Intrinsic Amplitude: 0.9 mV
Lead Channel Setting Pacing Amplitude: 1.25 V
Lead Channel Setting Pacing Amplitude: 1.625
Lead Channel Setting Pacing Pulse Width: 0.5 ms
Lead Channel Setting Sensing Sensitivity: 4 mV
Pulse Gen Model: 2210
Pulse Gen Serial Number: 7280216

## 2018-11-20 ENCOUNTER — Telehealth: Payer: Self-pay | Admitting: *Deleted

## 2018-11-20 DIAGNOSIS — I129 Hypertensive chronic kidney disease with stage 1 through stage 4 chronic kidney disease, or unspecified chronic kidney disease: Secondary | ICD-10-CM | POA: Diagnosis not present

## 2018-11-20 DIAGNOSIS — N183 Chronic kidney disease, stage 3 (moderate): Secondary | ICD-10-CM | POA: Diagnosis not present

## 2018-11-20 DIAGNOSIS — R809 Proteinuria, unspecified: Secondary | ICD-10-CM | POA: Diagnosis not present

## 2018-11-20 DIAGNOSIS — I509 Heart failure, unspecified: Secondary | ICD-10-CM | POA: Diagnosis not present

## 2018-11-20 NOTE — Telephone Encounter (Signed)
Spoke with patient to advise that per 11/19/18 remote PPM transmission, device battery has 8.7 months until ERI. Explained I will schedule automatic monthly battery checks through his home monitor. Pt is agreeable to this plan.  Pt asked about AF episodes. Explained AT/AF burden is 9.8%, longest episode 2hr 50min. Burden decreased since last transmission (26% at that time). Pt verbalizes understanding.  Pt had an episode of weakness last night. BP 83/47, sat on couch and put his feet up, SBP increased to >100, felt better. Reports he stays hydrated, hadn't taken his evening meds, including metoprolol, yet. Encouraged pt to keep a log of these episodes and his BP at the time. He is in agreement and will plan to call back if further episodes. Routed to Dr. Irish Lack as Juluis Rainier.

## 2018-11-30 ENCOUNTER — Encounter: Payer: Self-pay | Admitting: Cardiology

## 2018-11-30 DIAGNOSIS — H9201 Otalgia, right ear: Secondary | ICD-10-CM | POA: Diagnosis not present

## 2018-11-30 DIAGNOSIS — L989 Disorder of the skin and subcutaneous tissue, unspecified: Secondary | ICD-10-CM | POA: Diagnosis not present

## 2018-11-30 DIAGNOSIS — I480 Paroxysmal atrial fibrillation: Secondary | ICD-10-CM | POA: Diagnosis not present

## 2018-11-30 DIAGNOSIS — I129 Hypertensive chronic kidney disease with stage 1 through stage 4 chronic kidney disease, or unspecified chronic kidney disease: Secondary | ICD-10-CM | POA: Diagnosis not present

## 2018-11-30 DIAGNOSIS — E1169 Type 2 diabetes mellitus with other specified complication: Secondary | ICD-10-CM | POA: Diagnosis not present

## 2018-11-30 DIAGNOSIS — D696 Thrombocytopenia, unspecified: Secondary | ICD-10-CM | POA: Diagnosis not present

## 2018-11-30 DIAGNOSIS — I77810 Thoracic aortic ectasia: Secondary | ICD-10-CM | POA: Diagnosis not present

## 2018-11-30 DIAGNOSIS — I5032 Chronic diastolic (congestive) heart failure: Secondary | ICD-10-CM | POA: Diagnosis not present

## 2018-11-30 DIAGNOSIS — E1121 Type 2 diabetes mellitus with diabetic nephropathy: Secondary | ICD-10-CM | POA: Diagnosis not present

## 2018-11-30 DIAGNOSIS — N183 Chronic kidney disease, stage 3 (moderate): Secondary | ICD-10-CM | POA: Diagnosis not present

## 2018-11-30 NOTE — Progress Notes (Signed)
Remote pacemaker transmission.   

## 2018-12-02 ENCOUNTER — Other Ambulatory Visit: Payer: Self-pay | Admitting: Nephrology

## 2018-12-02 DIAGNOSIS — N183 Chronic kidney disease, stage 3 unspecified: Secondary | ICD-10-CM

## 2018-12-07 ENCOUNTER — Ambulatory Visit
Admission: RE | Admit: 2018-12-07 | Discharge: 2018-12-07 | Disposition: A | Discharge status: Home / Self Care | Payer: Medicare HMO | Source: Ambulatory Visit | Attending: Nephrology | Admitting: Nephrology

## 2018-12-07 ENCOUNTER — Other Ambulatory Visit: Payer: Self-pay

## 2018-12-07 DIAGNOSIS — N183 Chronic kidney disease, stage 3 unspecified: Secondary | ICD-10-CM

## 2018-12-07 DIAGNOSIS — N281 Cyst of kidney, acquired: Secondary | ICD-10-CM | POA: Diagnosis not present

## 2018-12-10 DIAGNOSIS — H9201 Otalgia, right ear: Secondary | ICD-10-CM | POA: Diagnosis not present

## 2018-12-10 DIAGNOSIS — H6123 Impacted cerumen, bilateral: Secondary | ICD-10-CM | POA: Diagnosis not present

## 2018-12-10 DIAGNOSIS — M26621 Arthralgia of right temporomandibular joint: Secondary | ICD-10-CM | POA: Diagnosis not present

## 2018-12-21 ENCOUNTER — Ambulatory Visit (INDEPENDENT_AMBULATORY_CARE_PROVIDER_SITE_OTHER): Payer: Medicare HMO | Admitting: *Deleted

## 2018-12-21 DIAGNOSIS — I442 Atrioventricular block, complete: Secondary | ICD-10-CM

## 2018-12-22 LAB — CUP PACEART REMOTE DEVICE CHECK
Battery Remaining Longevity: 9 mo
Battery Remaining Percentage: 8 %
Battery Voltage: 2.69 V
Brady Statistic AP VP Percent: 98 %
Brady Statistic AP VS Percent: 1 %
Brady Statistic AS VP Percent: 1.9 %
Brady Statistic AS VS Percent: 1 %
Brady Statistic RA Percent Paced: 90 %
Brady Statistic RV Percent Paced: 99 %
Date Time Interrogation Session: 20200727060013
Implantable Lead Implant Date: 20121031
Implantable Lead Implant Date: 20121031
Implantable Lead Location: 753859
Implantable Lead Location: 753860
Implantable Pulse Generator Implant Date: 20121031
Lead Channel Impedance Value: 380 Ohm
Lead Channel Impedance Value: 400 Ohm
Lead Channel Pacing Threshold Amplitude: 0.625 V
Lead Channel Pacing Threshold Amplitude: 1 V
Lead Channel Pacing Threshold Pulse Width: 0.5 ms
Lead Channel Pacing Threshold Pulse Width: 0.5 ms
Lead Channel Sensing Intrinsic Amplitude: 0.9 mV
Lead Channel Sensing Intrinsic Amplitude: 12 mV
Lead Channel Setting Pacing Amplitude: 1.25 V
Lead Channel Setting Pacing Amplitude: 1.625
Lead Channel Setting Pacing Pulse Width: 0.5 ms
Lead Channel Setting Sensing Sensitivity: 4 mV
Pulse Gen Model: 2210
Pulse Gen Serial Number: 7280216

## 2019-01-04 NOTE — Progress Notes (Signed)
Left message for patient to call back  

## 2019-01-04 NOTE — Progress Notes (Signed)
I don't know if this was addressed, but showed up in my inbox, despite this note being from March..  WOuld check if he has started or had any bleeding issues.

## 2019-01-07 ENCOUNTER — Telehealth: Payer: Self-pay | Admitting: Interventional Cardiology

## 2019-01-07 NOTE — Telephone Encounter (Signed)
Advised pt that I will forward his message to [place] or her review 01/08/19.[name] he is out of town and would like for her to call him back on his cell.. he reports that she had called him Monday 01/04/19.

## 2019-01-07 NOTE — Telephone Encounter (Signed)
New Message   Patient calling in stating that he is returning a call from Monday 01/04/19. States that vm was left and he was told to return the call, I didn't see any notes, but patient stated that it was Dr. Hassell Done Nurse that left the voicemail. Please give patient a call back.

## 2019-01-08 ENCOUNTER — Encounter: Payer: Self-pay | Admitting: Cardiology

## 2019-01-08 NOTE — Progress Notes (Signed)
Patient denies S/Sx of bleeding

## 2019-01-08 NOTE — Progress Notes (Signed)
Remote pacemaker transmission.   

## 2019-01-08 NOTE — Addendum Note (Signed)
Addended by: Tiajuana Amass on: 01/08/2019 12:58 PM   Modules accepted: Level of Service

## 2019-01-08 NOTE — Telephone Encounter (Signed)
To: Jettie Booze, MD, Cleon Gustin, RN   Two of the ingredients in it may interact with anticoagulants, which may put him at an increased risk of bleeding since he also takes apixaban. Probably would be okay to take as long as he looks out for signs/symptoms of bleeding.   Thanks, Jinny Blossom  ----- Message -----  From: Jettie Booze, MD   To: Megan E Supple, RPH, Cleon Gustin, RN   Any thoughts on the Stem cell supplement noted? Thanks.

## 2019-01-08 NOTE — Telephone Encounter (Signed)
Spoke with patient and he denies S/Sx of bleeding.

## 2019-01-21 ENCOUNTER — Ambulatory Visit: Payer: Medicare HMO | Admitting: *Deleted

## 2019-01-21 DIAGNOSIS — I442 Atrioventricular block, complete: Secondary | ICD-10-CM

## 2019-01-21 LAB — CUP PACEART REMOTE DEVICE CHECK
Battery Remaining Longevity: 5 mo
Battery Remaining Percentage: 5 %
Battery Voltage: 2.66 V
Brady Statistic AP VP Percent: 98 %
Brady Statistic AP VS Percent: 1 %
Brady Statistic AS VP Percent: 1.7 %
Brady Statistic AS VS Percent: 1 %
Brady Statistic RA Percent Paced: 92 %
Brady Statistic RV Percent Paced: 99 %
Date Time Interrogation Session: 20200827071757
Implantable Lead Implant Date: 20121031
Implantable Lead Implant Date: 20121031
Implantable Lead Location: 753859
Implantable Lead Location: 753860
Implantable Pulse Generator Implant Date: 20121031
Lead Channel Impedance Value: 380 Ohm
Lead Channel Impedance Value: 400 Ohm
Lead Channel Pacing Threshold Amplitude: 0.625 V
Lead Channel Pacing Threshold Amplitude: 0.875 V
Lead Channel Pacing Threshold Pulse Width: 0.5 ms
Lead Channel Pacing Threshold Pulse Width: 0.5 ms
Lead Channel Sensing Intrinsic Amplitude: 0.9 mV
Lead Channel Setting Pacing Amplitude: 1.125
Lead Channel Setting Pacing Amplitude: 1.625
Lead Channel Setting Pacing Pulse Width: 0.5 ms
Lead Channel Setting Sensing Sensitivity: 4 mV
Pulse Gen Model: 2210
Pulse Gen Serial Number: 7280216

## 2019-01-28 NOTE — Progress Notes (Signed)
Remote pacemaker transmission.   

## 2019-02-03 ENCOUNTER — Other Ambulatory Visit: Payer: Self-pay | Admitting: Interventional Cardiology

## 2019-02-03 MED ORDER — APIXABAN 2.5 MG PO TABS: 2.5000 mg | ORAL_TABLET | Freq: Two times a day (BID) | ORAL | 1 refills | Status: DC

## 2019-02-03 NOTE — Telephone Encounter (Signed)
Pt last saw Dr Lovena Le 09/29/18 telemedicine Covid-19, last labs 06/03/18 Creat 1.57, age 78, weight 82.6kg, based on specified criteria pt should be on Eliquis 2.5mg  BID.  Per lab result note from 06/03/18: Please let patient know labs are fairly stable compared to prior. Now that he is 78 years old (happy birthday!) and Cr is persistently greater than 1.5, he requires the lower dose of Eliquis. Please change to 2.5mg  BID. Needs f/u ~07/2018 with Dr. Octaviano Glow Dunn PA-C  [facility] 2.5mg  tablet BID.Called spoke with pt aware of tablet change.

## 2019-02-09 DIAGNOSIS — L570 Actinic keratosis: Secondary | ICD-10-CM | POA: Diagnosis not present

## 2019-02-09 DIAGNOSIS — D229 Melanocytic nevi, unspecified: Secondary | ICD-10-CM | POA: Diagnosis not present

## 2019-02-09 DIAGNOSIS — L821 Other seborrheic keratosis: Secondary | ICD-10-CM | POA: Diagnosis not present

## 2019-02-11 DIAGNOSIS — R809 Proteinuria, unspecified: Secondary | ICD-10-CM | POA: Diagnosis not present

## 2019-02-11 DIAGNOSIS — E559 Vitamin D deficiency, unspecified: Secondary | ICD-10-CM | POA: Diagnosis not present

## 2019-02-11 DIAGNOSIS — N183 Chronic kidney disease, stage 3 (moderate): Secondary | ICD-10-CM | POA: Diagnosis not present

## 2019-02-11 DIAGNOSIS — I129 Hypertensive chronic kidney disease with stage 1 through stage 4 chronic kidney disease, or unspecified chronic kidney disease: Secondary | ICD-10-CM | POA: Diagnosis not present

## 2019-02-11 DIAGNOSIS — I509 Heart failure, unspecified: Secondary | ICD-10-CM | POA: Diagnosis not present

## 2019-02-22 ENCOUNTER — Ambulatory Visit (INDEPENDENT_AMBULATORY_CARE_PROVIDER_SITE_OTHER): Payer: Medicare HMO | Admitting: *Deleted

## 2019-02-22 DIAGNOSIS — I480 Paroxysmal atrial fibrillation: Secondary | ICD-10-CM

## 2019-02-22 DIAGNOSIS — I5032 Chronic diastolic (congestive) heart failure: Secondary | ICD-10-CM | POA: Diagnosis not present

## 2019-02-22 LAB — CUP PACEART REMOTE DEVICE CHECK
Battery Remaining Longevity: 4 mo
Battery Remaining Percentage: 3 %
Battery Voltage: 2.65 V
Brady Statistic AP VP Percent: 98 %
Brady Statistic AP VS Percent: 1 %
Brady Statistic AS VP Percent: 1.6 %
Brady Statistic AS VS Percent: 1 %
Brady Statistic RA Percent Paced: 93 %
Brady Statistic RV Percent Paced: 99 %
Date Time Interrogation Session: 20200927060007
Implantable Lead Implant Date: 20121031
Implantable Lead Implant Date: 20121031
Implantable Lead Location: 753859
Implantable Lead Location: 753860
Implantable Pulse Generator Implant Date: 20121031
Lead Channel Impedance Value: 380 Ohm
Lead Channel Impedance Value: 440 Ohm
Lead Channel Pacing Threshold Amplitude: 0.75 V
Lead Channel Pacing Threshold Amplitude: 1 V
Lead Channel Pacing Threshold Pulse Width: 0.5 ms
Lead Channel Pacing Threshold Pulse Width: 0.5 ms
Lead Channel Sensing Intrinsic Amplitude: 0.9 mV
Lead Channel Sensing Intrinsic Amplitude: 12 mV
Lead Channel Setting Pacing Amplitude: 1.25 V
Lead Channel Setting Pacing Amplitude: 1.75 V
Lead Channel Setting Pacing Pulse Width: 0.5 ms
Lead Channel Setting Sensing Sensitivity: 4 mV
Pulse Gen Model: 2210
Pulse Gen Serial Number: 7280216

## 2019-03-03 NOTE — Progress Notes (Signed)
Remote pacemaker transmission.   

## 2019-03-17 DIAGNOSIS — Z23 Encounter for immunization: Secondary | ICD-10-CM | POA: Diagnosis not present

## 2019-03-25 ENCOUNTER — Ambulatory Visit (INDEPENDENT_AMBULATORY_CARE_PROVIDER_SITE_OTHER): Payer: Medicare HMO | Admitting: *Deleted

## 2019-03-25 DIAGNOSIS — I5032 Chronic diastolic (congestive) heart failure: Secondary | ICD-10-CM

## 2019-03-25 DIAGNOSIS — I480 Paroxysmal atrial fibrillation: Secondary | ICD-10-CM

## 2019-03-25 LAB — CUP PACEART REMOTE DEVICE CHECK
Battery Remaining Longevity: 1 mo
Battery Remaining Percentage: 1 %
Battery Voltage: 2.63 V
Brady Statistic AP VP Percent: 98 %
Brady Statistic AP VS Percent: 1 %
Brady Statistic AS VP Percent: 1.6 %
Brady Statistic AS VS Percent: 1 %
Brady Statistic RA Percent Paced: 93 %
Brady Statistic RV Percent Paced: 99 %
Date Time Interrogation Session: 20201028213828
Implantable Lead Implant Date: 20121031
Implantable Lead Implant Date: 20121031
Implantable Lead Location: 753859
Implantable Lead Location: 753860
Implantable Pulse Generator Implant Date: 20121031
Lead Channel Impedance Value: 350 Ohm
Lead Channel Impedance Value: 390 Ohm
Lead Channel Pacing Threshold Amplitude: 0.625 V
Lead Channel Pacing Threshold Amplitude: 1 V
Lead Channel Pacing Threshold Pulse Width: 0.5 ms
Lead Channel Pacing Threshold Pulse Width: 0.5 ms
Lead Channel Sensing Intrinsic Amplitude: 0.8 mV
Lead Channel Sensing Intrinsic Amplitude: 12 mV
Lead Channel Setting Pacing Amplitude: 1.25 V
Lead Channel Setting Pacing Amplitude: 1.625
Lead Channel Setting Pacing Pulse Width: 0.5 ms
Lead Channel Setting Sensing Sensitivity: 4 mV
Pulse Gen Model: 2210
Pulse Gen Serial Number: 7280216

## 2019-03-28 NOTE — Progress Notes (Signed)
Cardiology Office Note   Date:  03/29/2019   ID:  [name], DOB September 22, 1940, MRN [id number]  PCP:  Lajean Manes, MD    No chief complaint on file.  CAD  Wt Readings from Last 3 Encounters:  03/29/19 179 lb (81.2 kg)  08/07/18 182 lb (82.6 kg)  07/06/18 184 lb 12.8 oz (83.8 kg)       History of Present Illness: David [name] is a 78 y.o. male  with history ofCHB s/p pacemaker 2012, CAD (LAD atherectomy 1999- angina then was a "sick feeling in his chest" ), paroxysmal atrial fib (initially identified by pacemaker), mild-moderate AS, chronic diastolic CHF, ascending aortic and aortic root dilation, Gilbert's syndrome, HTN, peripheral neuropathy/RLS, HLD (managed by PCP), spinal stenosis, thrombocytopenia, DM, OSA, CKD III who presents for CHF follow-up.  Last ischemic assessment was in 2012 with PET nuclear test with moderately reduced blood flow but no indication for angiography [facility]). He is originally from [place] but has lived in [place] for many years.  In 06/2017, he was being managed for acute on chronic diastolic CHF in the setting of increased frequency of eating out while traveling. 2D echo 07/08/17 showed EF 50-55%, grade 2 DD, mild-moderate AS, mildly dilated aortic root and moderately dilated ascending aorta, mildly elevated PASP 68mmHg- study recommended to consider TEE if clinically indicated.  He was given flexibility dosing his diuretic.  He was seen by Melina Copa.   He developed AFlutter and cardioversion was planned.  He was in NSR when he went to the procedure.   He has had more swelling in his legs.    He has developed proteinuria, lisinopril was started but caused hypotension.  He asked about StemEnhance Ultra to help his heart.  Will check with PharmD.    Since the last visit, he has had some legand mid-section weakness when he tries to walk.    Denies : Chest pain. Dizziness. Nitroglycerin use. Orthopnea. Palpitations. Paroxysmal nocturnal  dyspnea. Shortness of breath. Syncope.   Using CPAP.  Notes some leg edema. Uses compression stockings.   No sx like the angina he had back in 1999.    On occasion, his BP has been low, in the 90/50 range. Typical BP at home is 110/70.   Torsemide was prescribed by his nephrologist.  He takes 2 pills in the AM and 1 in the afternoon.     he states that urine color is more amber than clear.    Past Medical History:  Diagnosis Date  . Aortic stenosis    a.mild-mod by echo 06/2017.  . Ascending aorta dilatation (HCC)    a. mildly dilated aortic root and mod dilated ascending aorta by echo 06/2017.  [name] Chronic diastolic CHF (congestive heart failure) ([place])   . CKD (chronic kidney disease), stage III   . Complete heart block (HCC)    a. s/p STJ pacemaker in 02/2011 - Dr. [name]  . Coronary artery disease    a. LAD atherectomy 1999.  . Gilbert's syndrome   . Hypertension   . Insomnia, unspecified   . Pacemaker-St Judes 07/16/2011  . PAF (paroxysmal atrial fibrillation) ([place])    a. identified on pacemaker interrogation; on Eliquis.  . Peripheral neuropathy   . Pure hypercholesterolemia   . Restless leg syndrome   . Spermatocele    near left testicle 2 cm  . Spinal stenosis, lumbar   . Thrombocytopenia ([place])    Averaging 140,000 since 2008  . Tubular adenoma 09/2012  Dr. Watt Climes repeat 4 years  . Type II or unspecified type diabetes mellitus without mention of complication, not stated as uncontrolled   . Unspecified sleep apnea     Past Surgical History:  Procedure Laterality Date  . APPENDECTOMY  1957  . CHOLECYSTECTOMY  1988  . CORONARY STENT PLACEMENT    . HEMORROIDECTOMY  1980  . HERNIA REPAIR Bilateral   . pacermaker  07/16/11   Dr. [name]  . TEE WITHOUT CARDIOVERSION N/A 06/14/2014   Procedure: TRANSESOPHAGEAL ECHOCARDIOGRAM (TEE);  Surgeon: [name], MD;  Location: [facility] ENDOSCOPY;  Service: Cardiovascular;  Laterality: N/A;     Current Outpatient Medications   Medication Sig Dispense Refill  . acetaminophen (TYLENOL) 500 MG tablet Take 1,000 mg by mouth every 6 (six) hours as needed for mild pain.     [name] apixaban (ELIQUIS) 2.5 MG TABS tablet Take 1 tablet (2.5 mg total) by mouth 2 (two) times daily. 180 tablet 1  . cholecalciferol (VITAMIN D) 1000 UNITS tablet Take 1,000 Units by mouth daily.    . Coenzyme Q10 (COQ10) 200 MG CAPS Take 200 mg by mouth daily.    . Cyanocobalamin (VITAMIN B-12) 2500 MCG SUBL Place 2,500 mcg under the tongue daily.     [name] KLOR-CON M20 20 MEQ tablet TAKE 1 TABLET DAILY 90 tablet 3  . metoprolol tartrate (LOPRESSOR) 50 MG tablet Take 0.5 tablets (25 mg total) by mouth 2 (two) times daily. 90 tablet 3  . Multiple Vitamin (MULTIVITAMIN) capsule Take 1 capsule by mouth 3 (three) times a week.     . nitroGLYCERIN (NITROSTAT) 0.4 MG SL tablet Place 1 tablet (0.4 mg total) under the tongue every 5 (five) minutes as needed. For chest pain (Patient taking differently: Place 0.4 mg under the tongue every 5 (five) minutes as needed for chest pain. ) 25 tablet 5  . Omega-3 Fatty Acids (FISH OIL) 1000 MG CAPS Take 1,000 mg by mouth daily.    . Polyvinyl Alcohol-Povidone (REFRESH OP) Place 1 drop into both eyes daily as needed (dry eyes).    . pramipexole (MIRAPEX) 1 MG tablet Take 2 mg by mouth at bedtime.      . simvastatin (ZOCOR) 10 MG tablet Take 10 mg by mouth every evening.     . torsemide (DEMADEX) 20 MG tablet Take 20 mg by mouth See admin instructions. Take 20 mg by mouth 2 to 3 times daily    . vitamin C (ASCORBIC ACID) 500 MG tablet Take 500 mg by mouth daily.     No current facility-administered medications for this visit.     Allergies:   Benadryl [diphenhydramine hcl]; Diphenhydramine hcl; Lisinopril; Antihistamines, chlorpheniramine-type; Other; and Restoril [temazepam]    Social History:  The patient  reports that he has quit smoking. He has never used smokeless tobacco. He reports current alcohol use. He reports that  he does not use drugs.   Family History:  The patient's family history includes CAD in his father; Cancer in his mother; Diabetes in his father; Heart attack in his father; Heart disease in his father; Hypertension in his father and sister.    ROS:  Please see the history of present illness.   Otherwise, review of systems are positive for occasional fatigue. Balance is somewhat reduced.  All other systems are reviewed and negative.    PHYSICAL EXAM: VS:  BP 110/72   Pulse 76   Ht 6' (1.829 m)   Wt 179 lb (81.2 kg)   SpO2 100%  BMI 24.28 kg/m  , BMI Body mass index is 24.28 kg/m. GEN: Well nourished, well developed, in no acute distress  HEENT: normal  Neck: no JVD, carotid bruits, or masses Cardiac: RRR; no murmurs, rubs, or gallops,no edema  Respiratory:  clear to auscultation bilaterally, normal work of breathing GI: soft, nontender, nondistended, + BS MS: no deformity or atrophy  Skin: warm and dry, no rash Neuro:  Strength and sensation are intact Psych: euthymic mood, full affect   EKG:   The ekg ordered 2/20 demonstrates paced rhythm   Recent Labs: 04/16/2018: TSH 3.010 04/27/2018: NT-Pro BNP 4,001; Platelets 146 06/03/2018: BUN 43; Creatinine, Ser 1.57 07/22/2018: Hemoglobin 12.9; Potassium 3.6; Sodium 139   Lipid Panel    Component Value Date/Time   CHOL 119 (L) 05/04/2015 0818   TRIG 62 05/04/2015 0818   HDL 58 05/04/2015 0818   CHOLHDL 2.1 05/04/2015 0818   VLDL 12 05/04/2015 0818   LDLCALC 49 05/04/2015 0818     Other studies Reviewed: Additional studies/ records that were reviewed today with results demonstrating: labs reviewed, most recently checked at nephrologist.   ASSESSMENT AND PLAN:  1. Chronic diastolic heart failure: He has some swelling at times. Some low BP readings, so will decrease torsemide to 20 mg BID.   2. DOE: stable.  Likely multifactorial.  No sx of angina like he had in the past.  3. Aortic stenosis: moderate in the past. Plan  echo in 04/2019.  Avoid dehydration given AS as well.  If AS is now severe, may need to consider angiography and TAVR w/u if his DOE persists.  Avoid hypotension. 4. Atrial flutter: noted in the past.  On anticoagulation or stroke prevention.  5. Leg edema:  Elevate legs.  Use compression stockings.  Avoid falling.  6. CKD: Stable.  Avoid nephrotoxins.  7. Gen change anticipated in early 2021.   Current medicines are reviewed at length with the patient today.  The patient concerns regarding his medicines were addressed.  The following changes have been made:  No change  Labs/ tests ordered today include:  No orders of the defined types were placed in this encounter.   Recommend 150 minutes/week of aerobic exercise Low fat, low carb, high fiber diet recommended  Disposition:   FU in 1 months for echo,  6 months for visit   Signed, Larae Grooms, MD  03/29/2019 11:11 AM    [address], [place], [place]  [address] Phone: [phone]; Fax: [phone]

## 2019-03-29 ENCOUNTER — Encounter: Payer: Self-pay | Admitting: Interventional Cardiology

## 2019-03-29 ENCOUNTER — Ambulatory Visit: Payer: Medicare HMO | Admitting: Interventional Cardiology

## 2019-03-29 ENCOUNTER — Other Ambulatory Visit: Payer: Self-pay

## 2019-03-29 VITALS — BP 110/72 | HR 76 | Ht 72.0 in | Wt 179.0 lb

## 2019-03-29 DIAGNOSIS — I5032 Chronic diastolic (congestive) heart failure: Secondary | ICD-10-CM | POA: Diagnosis not present

## 2019-03-29 DIAGNOSIS — I480 Paroxysmal atrial fibrillation: Secondary | ICD-10-CM | POA: Diagnosis not present

## 2019-03-29 DIAGNOSIS — R0600 Dyspnea, unspecified: Secondary | ICD-10-CM

## 2019-03-29 DIAGNOSIS — I350 Nonrheumatic aortic (valve) stenosis: Secondary | ICD-10-CM | POA: Diagnosis not present

## 2019-03-29 DIAGNOSIS — N189 Chronic kidney disease, unspecified: Secondary | ICD-10-CM | POA: Diagnosis not present

## 2019-03-29 DIAGNOSIS — R0609 Other forms of dyspnea: Secondary | ICD-10-CM

## 2019-03-29 MED ORDER — TORSEMIDE 20 MG PO TABS: 20.0000 mg | ORAL_TABLET | Freq: Two times a day (BID) | ORAL | 3 refills | Status: DC

## 2019-03-29 NOTE — Patient Instructions (Addendum)
Medication Instructions:  Your physician has recommended you make the following change in your medication:   DECREASE: torsemide to 20 mg twice a day  Lab Work: None ordered  If you have labs (blood work) drawn today and your tests are completely normal, you will receive your results only by: [name] MyChart Message (if you have MyChart) OR . A paper copy in the mail If you have any lab test that is abnormal or we need to change your treatment, we will call you to review the results.  Testing/Procedures: Your physician has requested that you have an echocardiogram in [place]. Echocardiography is a painless test that uses sound waves to create images of your heart. It provides your doctor with information about the size and shape of your heart and how well your heart's chambers and valves are working. This procedure takes approximately one hour. There are no restrictions for this procedure.  Follow-Up: At [facility], you and your health needs are our priority.  As part of our continuing mission to provide you with exceptional heart care, we have created designated Provider Care Teams.  These Care Teams include your primary Cardiologist (physician) and Advanced Practice Providers (APPs -  Physician Assistants and Nurse Practitioners) who all work together to provide you with the care you need, when you need it.  Your next appointment:   6 months  The format for your next appointment:   Either In Person or Virtual  Provider:   You may see Larae Grooms, MD or one of the following Advanced Practice Providers on your designated Care Team:    Melina Copa, PA-C  Ermalinda Barrios, PA-C   Other Instructions

## 2019-04-05 DIAGNOSIS — Z950 Presence of cardiac pacemaker: Secondary | ICD-10-CM | POA: Diagnosis not present

## 2019-04-05 DIAGNOSIS — I480 Paroxysmal atrial fibrillation: Secondary | ICD-10-CM | POA: Diagnosis not present

## 2019-04-05 DIAGNOSIS — I5032 Chronic diastolic (congestive) heart failure: Secondary | ICD-10-CM | POA: Diagnosis not present

## 2019-04-05 DIAGNOSIS — E1121 Type 2 diabetes mellitus with diabetic nephropathy: Secondary | ICD-10-CM | POA: Diagnosis not present

## 2019-04-05 DIAGNOSIS — E7800 Pure hypercholesterolemia, unspecified: Secondary | ICD-10-CM | POA: Diagnosis not present

## 2019-04-05 DIAGNOSIS — E1169 Type 2 diabetes mellitus with other specified complication: Secondary | ICD-10-CM | POA: Diagnosis not present

## 2019-04-05 DIAGNOSIS — R42 Dizziness and giddiness: Secondary | ICD-10-CM | POA: Diagnosis not present

## 2019-04-05 DIAGNOSIS — I129 Hypertensive chronic kidney disease with stage 1 through stage 4 chronic kidney disease, or unspecified chronic kidney disease: Secondary | ICD-10-CM | POA: Diagnosis not present

## 2019-04-17 NOTE — Progress Notes (Signed)
Remote pacemaker transmission.   

## 2019-04-21 DIAGNOSIS — G4731 Primary central sleep apnea: Secondary | ICD-10-CM | POA: Diagnosis not present

## 2019-04-25 LAB — CUP PACEART REMOTE DEVICE CHECK
Battery Remaining Longevity: 1 mo
Battery Remaining Percentage: 0.5 %
Battery Voltage: 2.6 V
Brady Statistic AP VP Percent: 98 %
Brady Statistic AP VS Percent: 1 %
Brady Statistic AS VP Percent: 1.6 %
Brady Statistic AS VS Percent: 1 %
Brady Statistic RA Percent Paced: 94 %
Brady Statistic RV Percent Paced: 99 %
Date Time Interrogation Session: 20201128024723
Implantable Lead Implant Date: 20121031
Implantable Lead Implant Date: 20121031
Implantable Lead Location: 753859
Implantable Lead Location: 753860
Implantable Pulse Generator Implant Date: 20121031
Lead Channel Impedance Value: 330 Ohm
Lead Channel Impedance Value: 390 Ohm
Lead Channel Pacing Threshold Amplitude: 0.625 V
Lead Channel Pacing Threshold Amplitude: 1.125 V
Lead Channel Pacing Threshold Pulse Width: 0.5 ms
Lead Channel Pacing Threshold Pulse Width: 0.5 ms
Lead Channel Sensing Intrinsic Amplitude: 12 mV
Lead Channel Sensing Intrinsic Amplitude: 2 mV
Lead Channel Setting Pacing Amplitude: 1.375
Lead Channel Setting Pacing Amplitude: 1.625
Lead Channel Setting Pacing Pulse Width: 0.5 ms
Lead Channel Setting Sensing Sensitivity: 4 mV
Pulse Gen Model: 2210
Pulse Gen Serial Number: 7280216

## 2019-04-26 ENCOUNTER — Ambulatory Visit (INDEPENDENT_AMBULATORY_CARE_PROVIDER_SITE_OTHER): Payer: Medicare HMO | Admitting: Internal Medicine

## 2019-04-26 ENCOUNTER — Other Ambulatory Visit: Payer: Self-pay

## 2019-04-26 ENCOUNTER — Encounter: Payer: Self-pay | Admitting: Internal Medicine

## 2019-04-26 VITALS — BP 110/60 | HR 72 | Ht 72.0 in | Wt 187.0 lb

## 2019-04-26 DIAGNOSIS — I480 Paroxysmal atrial fibrillation: Secondary | ICD-10-CM | POA: Diagnosis not present

## 2019-04-26 DIAGNOSIS — I442 Atrioventricular block, complete: Secondary | ICD-10-CM | POA: Diagnosis not present

## 2019-04-26 DIAGNOSIS — Z950 Presence of cardiac pacemaker: Secondary | ICD-10-CM

## 2019-04-26 DIAGNOSIS — I5032 Chronic diastolic (congestive) heart failure: Secondary | ICD-10-CM | POA: Diagnosis not present

## 2019-04-26 LAB — CUP PACEART INCLINIC DEVICE CHECK
Battery Remaining Longevity: 0 mo
Battery Voltage: 2.6 V
Brady Statistic RA Percent Paced: 94 %
Brady Statistic RV Percent Paced: 99.98 %
Date Time Interrogation Session: 20201130145700
Implantable Lead Implant Date: 20121031
Implantable Lead Implant Date: 20121031
Implantable Lead Location: 753859
Implantable Lead Location: 753860
Implantable Pulse Generator Implant Date: 20121031
Lead Channel Impedance Value: 337.5 Ohm
Lead Channel Impedance Value: 425 Ohm
Lead Channel Pacing Threshold Amplitude: 0.625 V
Lead Channel Pacing Threshold Amplitude: 1.25 V
Lead Channel Pacing Threshold Pulse Width: 0.5 ms
Lead Channel Pacing Threshold Pulse Width: 0.5 ms
Lead Channel Sensing Intrinsic Amplitude: 1 mV
Lead Channel Setting Pacing Amplitude: 1.5 V
Lead Channel Setting Pacing Amplitude: 1.625
Lead Channel Setting Pacing Pulse Width: 0.5 ms
Lead Channel Setting Sensing Sensitivity: 4 mV
Pulse Gen Model: 2210
Pulse Gen Serial Number: 7280216

## 2019-04-26 NOTE — H&P (View-Only) (Signed)
HPI [name] returns today for followup. He has a h/o CHB, s/p PPM insertion. He has reached ERI. He has mild LV dysfunction previously and a 2D echo is pending. The patient has not had syncope. He has chronic peripheral edema and chronic renal insufficiency stage 3. He has class 2 CHF symptoms. He is pending a repeat echo to assess his LV function. Allergies  Allergen Reactions  . Benadryl [Diphenhydramine Hcl] Other (See Comments)    Adverse reaction, has very strange behavior. Also hyperactive.  . Diphenhydramine Hcl Other (See Comments)    Adverse reaction, has very strange behavior. Also hyperactive.  . Lisinopril Other (See Comments)    hypotensive hypotensive  . Antihistamines, Chlorpheniramine-Type Palpitations  . Other Palpitations  . Restoril [Temazepam] Other (See Comments)    Hyperactive     Current Outpatient Medications  Medication Sig Dispense Refill  . acetaminophen (TYLENOL) 500 MG tablet Take 1,000 mg by mouth every 6 (six) hours as needed for mild pain.     [name] apixaban (ELIQUIS) 2.5 MG TABS tablet Take 1 tablet (2.5 mg total) by mouth 2 (two) times daily. 180 tablet 1  . cholecalciferol (VITAMIN D) 1000 UNITS tablet Take 1,000 Units by mouth daily.    . Coenzyme Q10 (COQ10) 100 MG CAPS Take 100 mg by mouth daily.     . Cyanocobalamin (VITAMIN B-12) 2500 MCG SUBL Place 2,500 mcg under the tongue daily.     [name] KLOR-CON M20 20 MEQ tablet TAKE 1 TABLET DAILY 90 tablet 3  . magnesium oxide (MAG-OX) 400 MG tablet Take 400 mg by mouth daily.    . metoprolol tartrate (LOPRESSOR) 50 MG tablet Take 0.5 tablets (25 mg total) by mouth 2 (two) times daily. 90 tablet 3  . Multiple Vitamin (MULTIVITAMIN) capsule Take 1 capsule by mouth 3 (three) times a week.     . nitroGLYCERIN (NITROSTAT) 0.4 MG SL tablet Place 1 tablet (0.4 mg total) under the tongue every 5 (five) minutes as needed. For chest pain (Patient taking differently: Place 0.4 mg under the tongue every 5 (five)  minutes as needed for chest pain. ) 25 tablet 5  . Omega-3 Fatty Acids (FISH OIL) 1000 MG CAPS Take 1,000 mg by mouth daily.    . Polyvinyl Alcohol-Povidone (REFRESH OP) Place 1 drop into both eyes daily as needed (dry eyes).    . pramipexole (MIRAPEX) 1 MG tablet Take 2 mg by mouth at bedtime.      . simvastatin (ZOCOR) 10 MG tablet Take 10 mg by mouth every evening.     . torsemide (DEMADEX) 20 MG tablet Take 1 tablet (20 mg total) by mouth 2 (two) times daily. 180 tablet 3  . vitamin C (ASCORBIC ACID) 500 MG tablet Take 500 mg by mouth daily.     No current facility-administered medications for this visit.      Past Medical History:  Diagnosis Date  . Aortic stenosis    a.mild-mod by echo 06/2017.  . Ascending aorta dilatation (HCC)    a. mildly dilated aortic root and mod dilated ascending aorta by echo 06/2017.  [name] Chronic diastolic CHF (congestive heart failure) ([place])   . CKD (chronic kidney disease), stage III   . Complete heart block (HCC)    a. s/p STJ pacemaker in 02/2011 - Dr. [name]  . Coronary artery disease    a. LAD atherectomy 1999.  . Gilbert's syndrome   . Hypertension   . Insomnia, unspecified   .  Pacemaker-[employer] Judes 07/16/2011  . PAF (paroxysmal atrial fibrillation) ([place])    a. identified on pacemaker interrogation; on Eliquis.  . Peripheral neuropathy   . Pure hypercholesterolemia   . Restless leg syndrome   . Spermatocele    near left testicle 2 cm  . Spinal stenosis, lumbar   . Thrombocytopenia ([place])    Averaging 140,000 since 2008  . Tubular adenoma 09/2012   Dr. Watt Climes repeat 4 years  . Type II or unspecified type diabetes mellitus without mention of complication, not stated as uncontrolled   . Unspecified sleep apnea     ROS:   All systems reviewed and negative except as noted in the HPI.   Past Surgical History:  Procedure Laterality Date  . APPENDECTOMY  1957  . CHOLECYSTECTOMY  1988  . CORONARY STENT PLACEMENT    . HEMORROIDECTOMY  1980  .  HERNIA REPAIR Bilateral   . pacermaker  07/16/11   Dr. [name]  . TEE WITHOUT CARDIOVERSION N/A 06/14/2014   Procedure: TRANSESOPHAGEAL ECHOCARDIOGRAM (TEE);  Surgeon: [name], MD;  Location: [facility] ENDOSCOPY;  Service: Cardiovascular;  Laterality: N/A;     Family History  Problem Relation Age of Onset  . Cancer Mother        Small cell lung cancer  . Diabetes Father        DM  . CAD Father   . Hypertension Father   . Heart disease Father   . Heart attack Father   . Hypertension Sister      Social History   Socioeconomic History  . Marital status: Married    Spouse name: Not on file  . Number of children: Not on file  . Years of education: Not on file  . Highest education level: Not on file  Occupational History  . Not on file  Social Needs  . Financial resource strain: Not on file  . Food insecurity    Worry: Not on file    Inability: Not on file  . Transportation needs    Medical: Not on file    Non-medical: Not on file  Tobacco Use  . Smoking status: Former Research scientist (life sciences)  . Smokeless tobacco: Never Used  Substance and Sexual Activity  . Alcohol use: Yes    Comment: occasional glass of wine  . Drug use: No  . Sexual activity: Not on file  Lifestyle  . Physical activity    Days per week: Not on file    Minutes per session: Not on file  . Stress: Not on file  Relationships  . Social Herbalist on phone: Not on file    Gets together: Not on file    Attends religious service: Not on file    Active member of club or organization: Not on file    Attends meetings of clubs or organizations: Not on file    Relationship status: Not on file  . Intimate partner violence    Fear of current or ex partner: Not on file    Emotionally abused: Not on file    Physically abused: Not on file    Forced sexual activity: Not on file  Other Topics Concern  . Not on file  Social History Narrative  . Not on file     BP 110/60   Pulse 72   Ht 6' (1.829 m)   Wt 187 lb  (84.8 kg)   SpO2 96%   BMI 25.36 kg/m   Physical Exam:  Well appearing NAD  HEENT: Unremarkable Neck:  No JVD, no thyromegally Lymphatics:  No adenopathy Back:  No CVA tenderness Lungs:  Clear with no wheezes HEART:  Regular rate rhythm, no murmurs, no rubs, no clicks Abd:  soft, positive bowel sounds, no organomegally, no rebound, no guarding Ext:  2 plus pulses, no edema, no cyanosis, no clubbing Skin:  No rashes no nodules Neuro:  CN II through XII intact, motor grossly intact  EKG - nsr with pacing induced RBBB  DEVICE  Normal device function.  See PaceArt for details. ERI.   Assess/Plan: 1. CHB - he is asymptomatic s/p PPM insertion. 2. PPM - his PPM is at ERI. I have asked him to undergo gen change out in the next few weeks. 3. PAF - he is out of rhythm about 5% of the time. He will continue his current meds. 4. HTN - his SBP is well controlled. No changed.  David [name].D

## 2019-04-26 NOTE — Progress Notes (Signed)
HPI Mr. Winchell returns today for followup. He has a h/o CHB, s/p PPM insertion. He has reached ERI. He has mild LV dysfunction previously and a 2D echo is pending. The patient has not had syncope. He has chronic peripheral edema and chronic renal insufficiency stage 3. He has class 2 CHF symptoms. He is pending a repeat echo to assess his LV function. Allergies  Allergen Reactions  . Benadryl [Diphenhydramine Hcl] Other (See Comments)    Adverse reaction, has very strange behavior. Also hyperactive.  . Diphenhydramine Hcl Other (See Comments)    Adverse reaction, has very strange behavior. Also hyperactive.  . Lisinopril Other (See Comments)    hypotensive hypotensive  . Antihistamines, Chlorpheniramine-Type Palpitations  . Other Palpitations  . Restoril [Temazepam] Other (See Comments)    Hyperactive     Current Outpatient Medications  Medication Sig Dispense Refill  . acetaminophen (TYLENOL) 500 MG tablet Take 1,000 mg by mouth every 6 (six) hours as needed for mild pain.     [name] apixaban (ELIQUIS) 2.5 MG TABS tablet Take 1 tablet (2.5 mg total) by mouth 2 (two) times daily. 180 tablet 1  . cholecalciferol (VITAMIN D) 1000 UNITS tablet Take 1,000 Units by mouth daily.    . Coenzyme Q10 (COQ10) 100 MG CAPS Take 100 mg by mouth daily.     . Cyanocobalamin (VITAMIN B-12) 2500 MCG SUBL Place 2,500 mcg under the tongue daily.     [name] KLOR-CON M20 20 MEQ tablet TAKE 1 TABLET DAILY 90 tablet 3  . magnesium oxide (MAG-OX) 400 MG tablet Take 400 mg by mouth daily.    . metoprolol tartrate (LOPRESSOR) 50 MG tablet Take 0.5 tablets (25 mg total) by mouth 2 (two) times daily. 90 tablet 3  . Multiple Vitamin (MULTIVITAMIN) capsule Take 1 capsule by mouth 3 (three) times a week.     . nitroGLYCERIN (NITROSTAT) 0.4 MG SL tablet Place 1 tablet (0.4 mg total) under the tongue every 5 (five) minutes as needed. For chest pain (Patient taking differently: Place 0.4 mg under the tongue every 5 (five)  minutes as needed for chest pain. ) 25 tablet 5  . Omega-3 Fatty Acids (FISH OIL) 1000 MG CAPS Take 1,000 mg by mouth daily.    . Polyvinyl Alcohol-Povidone (REFRESH OP) Place 1 drop into both eyes daily as needed (dry eyes).    . pramipexole (MIRAPEX) 1 MG tablet Take 2 mg by mouth at bedtime.      . simvastatin (ZOCOR) 10 MG tablet Take 10 mg by mouth every evening.     . torsemide (DEMADEX) 20 MG tablet Take 1 tablet (20 mg total) by mouth 2 (two) times daily. 180 tablet 3  . vitamin C (ASCORBIC ACID) 500 MG tablet Take 500 mg by mouth daily.     No current facility-administered medications for this visit.      Past Medical History:  Diagnosis Date  . Aortic stenosis    a.mild-mod by echo 06/2017.  . Ascending aorta dilatation (HCC)    a. mildly dilated aortic root and mod dilated ascending aorta by echo 06/2017.  [name] Chronic diastolic CHF (congestive heart failure) ([place])   . CKD (chronic kidney disease), stage III   . Complete heart block (HCC)    a. s/p STJ pacemaker in 02/2011 - Dr. [name]  . Coronary artery disease    a. LAD atherectomy 1999.  . Gilbert's syndrome   . Hypertension   . Insomnia, unspecified   .  Pacemaker-[place] 07/16/2011  . PAF (paroxysmal atrial fibrillation) ([place])    a. identified on pacemaker interrogation; on Eliquis.  . Peripheral neuropathy   . Pure hypercholesterolemia   . Restless leg syndrome   . Spermatocele    near left testicle 2 cm  . Spinal stenosis, lumbar   . Thrombocytopenia ([place])    Averaging 140,000 since 2008  . Tubular adenoma 09/2012   Dr. Watt Climes repeat 4 years  . Type II or unspecified type diabetes mellitus without mention of complication, not stated as uncontrolled   . Unspecified sleep apnea     ROS:   All systems reviewed and negative except as noted in the HPI.   Past Surgical History:  Procedure Laterality Date  . APPENDECTOMY  1957  . CHOLECYSTECTOMY  1988  . CORONARY STENT PLACEMENT    . HEMORROIDECTOMY  1980  .  HERNIA REPAIR Bilateral   . pacermaker  07/16/11   Dr. [name]  . TEE WITHOUT CARDIOVERSION N/A 06/14/2014   Procedure: TRANSESOPHAGEAL ECHOCARDIOGRAM (TEE);  Surgeon: [name], MD;  Location: [facility] ENDOSCOPY;  Service: Cardiovascular;  Laterality: N/A;     Family History  Problem Relation Age of Onset  . Cancer Mother        Small cell lung cancer  . Diabetes Father        DM  . CAD Father   . Hypertension Father   . Heart disease Father   . Heart attack Father   . Hypertension Sister      Social History   Socioeconomic History  . Marital status: Married    Spouse name: Not on file  . Number of children: Not on file  . Years of education: Not on file  . Highest education level: Not on file  Occupational History  . Not on file  Social Needs  . Financial resource strain: Not on file  . Food insecurity    Worry: Not on file    Inability: Not on file  . Transportation needs    Medical: Not on file    Non-medical: Not on file  Tobacco Use  . Smoking status: Former Research scientist (life sciences)  . Smokeless tobacco: Never Used  Substance and Sexual Activity  . Alcohol use: Yes    Comment: occasional glass of wine  . Drug use: No  . Sexual activity: Not on file  Lifestyle  . Physical activity    Days per week: Not on file    Minutes per session: Not on file  . Stress: Not on file  Relationships  . Social Herbalist on phone: Not on file    Gets together: Not on file    Attends religious service: Not on file    Active member of club or organization: Not on file    Attends meetings of clubs or organizations: Not on file    Relationship status: Not on file  . Intimate partner violence    Fear of current or ex partner: Not on file    Emotionally abused: Not on file    Physically abused: Not on file    Forced sexual activity: Not on file  Other Topics Concern  . Not on file  Social History Narrative  . Not on file     BP 110/60   Pulse 72   Ht 6' (1.829 m)   Wt 187 lb  (84.8 kg)   SpO2 96%   BMI 25.36 kg/m   Physical Exam:  Well appearing NAD  HEENT: Unremarkable Neck:  No JVD, no thyromegally Lymphatics:  No adenopathy Back:  No CVA tenderness Lungs:  Clear with no wheezes HEART:  Regular rate rhythm, no murmurs, no rubs, no clicks Abd:  soft, positive bowel sounds, no organomegally, no rebound, no guarding Ext:  2 plus pulses, no edema, no cyanosis, no clubbing Skin:  No rashes no nodules Neuro:  CN II through XII intact, motor grossly intact  EKG - nsr with pacing induced RBBB  DEVICE  Normal device function.  See PaceArt for details. ERI.   Assess/Plan: 1. CHB - he is asymptomatic s/p PPM insertion. 2. PPM - his PPM is at ERI. I have asked him to undergo gen change out in the next few weeks. 3. PAF - he is out of rhythm about 5% of the time. He will continue his current meds. 4. HTN - his SBP is well controlled. No changed.  [name].D

## 2019-04-26 NOTE — Patient Instructions (Addendum)
Medication Instructions:  Your physician recommends that you continue on your current medications as directed. Please refer to the Current Medication list given to you today.  Labwork: You will get lab work today:  BMP and CBC  Testing/Procedures: None ordered.  Follow-Up:  SEE instruction letter  Any Other Special Instructions Will Be Listed Below (If Applicable).  If you need a refill on your cardiac medications before your next appointment, please call your pharmacy.

## 2019-04-27 LAB — CBC WITH DIFFERENTIAL/PLATELET
Basophils Absolute: 0.1 10*3/uL (ref 0.0–0.2)
Basos: 1 %
EOS (ABSOLUTE): 0.1 10*3/uL (ref 0.0–0.4)
Eos: 2 %
Hematocrit: 36.7 % — ABNORMAL LOW (ref 37.5–51.0)
Hemoglobin: 12.2 g/dL — ABNORMAL LOW (ref 13.0–17.7)
Immature Grans (Abs): 0 10*3/uL (ref 0.0–0.1)
Immature Granulocytes: 0 %
Lymphocytes Absolute: 1.7 10*3/uL (ref 0.7–3.1)
Lymphs: 21 %
MCH: 32 pg (ref 26.6–33.0)
MCHC: 33.2 g/dL (ref 31.5–35.7)
MCV: 96 fL (ref 79–97)
Monocytes Absolute: 0.7 10*3/uL (ref 0.1–0.9)
Monocytes: 8 %
Neutrophils Absolute: 5.6 10*3/uL (ref 1.4–7.0)
Neutrophils: 68 %
Platelets: 133 10*3/uL — ABNORMAL LOW (ref 150–450)
RBC: 3.81 x10E6/uL — ABNORMAL LOW (ref 4.14–5.80)
RDW: 12.6 % (ref 11.6–15.4)
WBC: 8.1 10*3/uL (ref 3.4–10.8)

## 2019-04-27 LAB — BASIC METABOLIC PANEL
BUN/Creatinine Ratio: 23 (ref 10–24)
BUN: 37 mg/dL — ABNORMAL HIGH (ref 8–27)
CO2: 27 mmol/L (ref 20–29)
Calcium: 9.4 mg/dL (ref 8.6–10.2)
Chloride: 103 mmol/L (ref 96–106)
Creatinine, Ser: 1.62 mg/dL — ABNORMAL HIGH (ref 0.76–1.27)
GFR calc Af Amer: 46 mL/min/{1.73_m2} — ABNORMAL LOW (ref >59)
GFR calc non Af Amer: 39 mL/min/{1.73_m2} — ABNORMAL LOW (ref >59)
Glucose: 123 mg/dL — ABNORMAL HIGH (ref 65–99)
Potassium: 4.7 mmol/L (ref 3.5–5.2)
Sodium: 143 mmol/L (ref 134–144)

## 2019-04-29 ENCOUNTER — Telehealth: Payer: Self-pay | Admitting: Internal Medicine

## 2019-04-29 NOTE — Telephone Encounter (Signed)
New Message: David [name], wife of the patient called. The patient is scheduled to get a COVID test on Saturday. The wife wants to know if she will be able to get one that day as well. She is not established with our practice, but she does see Dr. [name] like her Husband.

## 2019-05-01 ENCOUNTER — Other Ambulatory Visit (HOSPITAL_COMMUNITY)
Admission: RE | Admit: 2019-05-01 | Discharge: 2019-05-01 | Disposition: A | Discharge status: Home / Self Care | Payer: Medicare HMO | Source: Ambulatory Visit | Attending: Internal Medicine | Admitting: Internal Medicine

## 2019-05-01 DIAGNOSIS — Z01812 Encounter for preprocedural laboratory examination: Secondary | ICD-10-CM | POA: Insufficient documentation

## 2019-05-01 DIAGNOSIS — Z20828 Contact with and (suspected) exposure to other viral communicable diseases: Secondary | ICD-10-CM | POA: Insufficient documentation

## 2019-05-03 ENCOUNTER — Ambulatory Visit (HOSPITAL_COMMUNITY): Payer: Medicare HMO | Attending: Cardiology

## 2019-05-03 ENCOUNTER — Other Ambulatory Visit: Payer: Self-pay

## 2019-05-03 DIAGNOSIS — I350 Nonrheumatic aortic (valve) stenosis: Secondary | ICD-10-CM | POA: Insufficient documentation

## 2019-05-03 LAB — NOVEL CORONAVIRUS, NAA (HOSP ORDER, SEND-OUT TO REF LAB; TAT 18-24 HRS): SARS-CoV-2, NAA: NOT DETECTED

## 2019-05-03 NOTE — Telephone Encounter (Signed)
[place] returned call.  Advised wife she could get community test if she wanted to, but not from our practice.

## 2019-05-05 ENCOUNTER — Ambulatory Visit (HOSPITAL_COMMUNITY)
Admission: RE | Admit: 2019-05-05 | Discharge: 2019-05-05 | Disposition: A | Discharge status: Home / Self Care | Payer: Medicare HMO | Attending: Internal Medicine | Admitting: Internal Medicine

## 2019-05-05 ENCOUNTER — Other Ambulatory Visit: Payer: Self-pay

## 2019-05-05 ENCOUNTER — Ambulatory Visit (HOSPITAL_COMMUNITY)
Admission: RE | Disposition: A | Discharge status: Home / Self Care | Payer: Self-pay | Source: Home / Self Care | Attending: Internal Medicine

## 2019-05-05 ENCOUNTER — Ambulatory Visit (HOSPITAL_COMMUNITY): Payer: Medicare HMO

## 2019-05-05 DIAGNOSIS — Z8249 Family history of ischemic heart disease and other diseases of the circulatory system: Secondary | ICD-10-CM | POA: Diagnosis not present

## 2019-05-05 DIAGNOSIS — Z45010 Encounter for checking and testing of cardiac pacemaker pulse generator [battery]: Secondary | ICD-10-CM | POA: Insufficient documentation

## 2019-05-05 DIAGNOSIS — Z950 Presence of cardiac pacemaker: Secondary | ICD-10-CM | POA: Diagnosis not present

## 2019-05-05 DIAGNOSIS — I130 Hypertensive heart and chronic kidney disease with heart failure and stage 1 through stage 4 chronic kidney disease, or unspecified chronic kidney disease: Secondary | ICD-10-CM | POA: Diagnosis not present

## 2019-05-05 DIAGNOSIS — I350 Nonrheumatic aortic (valve) stenosis: Secondary | ICD-10-CM | POA: Diagnosis not present

## 2019-05-05 DIAGNOSIS — N183 Chronic kidney disease, stage 3 unspecified: Secondary | ICD-10-CM | POA: Diagnosis not present

## 2019-05-05 DIAGNOSIS — Z45018 Encounter for adjustment and management of other part of cardiac pacemaker: Secondary | ICD-10-CM | POA: Diagnosis not present

## 2019-05-05 DIAGNOSIS — Z87891 Personal history of nicotine dependence: Secondary | ICD-10-CM | POA: Insufficient documentation

## 2019-05-05 DIAGNOSIS — I2510 Atherosclerotic heart disease of native coronary artery without angina pectoris: Secondary | ICD-10-CM | POA: Diagnosis not present

## 2019-05-05 DIAGNOSIS — Z888 Allergy status to other drugs, medicaments and biological substances status: Secondary | ICD-10-CM | POA: Diagnosis not present

## 2019-05-05 DIAGNOSIS — Z955 Presence of coronary angioplasty implant and graft: Secondary | ICD-10-CM | POA: Insufficient documentation

## 2019-05-05 DIAGNOSIS — I442 Atrioventricular block, complete: Secondary | ICD-10-CM | POA: Diagnosis present

## 2019-05-05 DIAGNOSIS — E1122 Type 2 diabetes mellitus with diabetic chronic kidney disease: Secondary | ICD-10-CM | POA: Insufficient documentation

## 2019-05-05 DIAGNOSIS — G2581 Restless legs syndrome: Secondary | ICD-10-CM | POA: Diagnosis not present

## 2019-05-05 DIAGNOSIS — Z79899 Other long term (current) drug therapy: Secondary | ICD-10-CM | POA: Insufficient documentation

## 2019-05-05 DIAGNOSIS — E7800 Pure hypercholesterolemia, unspecified: Secondary | ICD-10-CM | POA: Diagnosis not present

## 2019-05-05 DIAGNOSIS — I5022 Chronic systolic (congestive) heart failure: Secondary | ICD-10-CM | POA: Diagnosis present

## 2019-05-05 DIAGNOSIS — I480 Paroxysmal atrial fibrillation: Secondary | ICD-10-CM | POA: Diagnosis not present

## 2019-05-05 DIAGNOSIS — I5032 Chronic diastolic (congestive) heart failure: Secondary | ICD-10-CM | POA: Diagnosis not present

## 2019-05-05 DIAGNOSIS — Z7901 Long term (current) use of anticoagulants: Secondary | ICD-10-CM | POA: Diagnosis not present

## 2019-05-05 HISTORY — PX: PPM GENERATOR CHANGEOUT: EP1233

## 2019-05-05 HISTORY — PX: LEAD INSERTION: EP1212

## 2019-05-05 LAB — GLUCOSE, CAPILLARY: Glucose-Capillary: 114 mg/dL — ABNORMAL HIGH (ref 70–99)

## 2019-05-05 SURGERY — PPM GENERATOR CHANGEOUT

## 2019-05-05 MED ORDER — SODIUM CHLORIDE 0.9 % IV SOLN
INTRAVENOUS | Status: DC
  Administered 2019-05-05: 10:00:00 via INTRAVENOUS

## 2019-05-05 MED ORDER — ONDANSETRON HCL 4 MG/2ML IJ SOLN: 4.0000 mg | Freq: Four times a day (QID) | INTRAMUSCULAR | Status: DC | PRN

## 2019-05-05 MED ORDER — ACETAMINOPHEN 325 MG PO TABS: 325.0000 mg | ORAL_TABLET | ORAL | Status: DC | PRN

## 2019-05-05 MED ORDER — MIDAZOLAM HCL 5 MG/5ML IJ SOLN
INTRAMUSCULAR | Status: AC
  Filled 2019-05-05: qty 5

## 2019-05-05 MED ORDER — CHLORHEXIDINE GLUCONATE 4 % EX LIQD: 4.0000 "application " | Freq: Once | CUTANEOUS | Status: DC

## 2019-05-05 MED ORDER — SODIUM CHLORIDE 0.9 % IV SOLN
80.0000 mg | INTRAVENOUS | Status: AC
  Administered 2019-05-05: 80 mg

## 2019-05-05 MED ORDER — HEPARIN (PORCINE) IN NACL 1000-0.9 UT/500ML-% IV SOLN
INTRAVENOUS | Status: DC | PRN
  Administered 2019-05-05: 500 mL

## 2019-05-05 MED ORDER — CEFAZOLIN SODIUM-DEXTROSE 2-4 GM/100ML-% IV SOLN
INTRAVENOUS | Status: AC
  Filled 2019-05-05: qty 100

## 2019-05-05 MED ORDER — SODIUM CHLORIDE 0.9 % IV SOLN
INTRAVENOUS | Status: AC
  Filled 2019-05-05: qty 2

## 2019-05-05 MED ORDER — LIDOCAINE HCL (PF) 1 % IJ SOLN
INTRAMUSCULAR | Status: DC | PRN
  Administered 2019-05-05: 50 mL

## 2019-05-05 MED ORDER — IOHEXOL 350 MG/ML SOLN
INTRAVENOUS | Status: DC | PRN
  Administered 2019-05-05: 15 mL

## 2019-05-05 MED ORDER — LIDOCAINE HCL (PF) 1 % IJ SOLN
INTRAMUSCULAR | Status: AC
  Filled 2019-05-05: qty 60

## 2019-05-05 MED ORDER — CEFAZOLIN SODIUM-DEXTROSE 1-4 GM/50ML-% IV SOLN: 1.0000 g | Freq: Four times a day (QID) | INTRAVENOUS | Status: DC

## 2019-05-05 MED ORDER — CEFAZOLIN SODIUM-DEXTROSE 2-4 GM/100ML-% IV SOLN
2.0000 g | INTRAVENOUS | Status: AC
  Administered 2019-05-05: 2 g via INTRAVENOUS

## 2019-05-05 MED ORDER — FENTANYL CITRATE (PF) 100 MCG/2ML IJ SOLN
INTRAMUSCULAR | Status: AC
  Filled 2019-05-05: qty 2

## 2019-05-05 SURGICAL SUPPLY — 17 items
CABLE SURGICAL S-101-97-12 (CABLE) ×2 IMPLANT
CATH CPS DIRECT 135 DS2C020 (CATHETERS) ×2 IMPLANT
CATH CPS QUART SUB DS2N026-59 (CATHETERS) ×2 IMPLANT
CATH HEX JOS 2-5-2 65CM 6F REP (CATHETERS) ×2 IMPLANT
CATH RIGHTSITE C315HIS02 (CATHETERS) ×2 IMPLANT
CPS IMPLANT KIT 410190 (MISCELLANEOUS) ×2 IMPLANT
LEAD SELECT SECURE 3830 383069 (Lead) ×1 IMPLANT
PACEMAKER ALLR CRT-P RF PM3222 (Pacemaker) ×1 IMPLANT
PAD PRO RADIOLUCENT 2001M-C (PAD) ×2 IMPLANT
PPM ALLURE CRT-P RF PM3222 (Pacemaker) ×2 IMPLANT
SELECT SECURE 3830 383069 (Lead) ×2 IMPLANT
SHEATH 9FR PRELUDE SNAP 13 (SHEATH) ×2 IMPLANT
SHEATH WORLEY 9FR 62CM (SHEATH) ×2 IMPLANT
SLITTER 6232ADJ (MISCELLANEOUS) ×2 IMPLANT
TRAY PACEMAKER INSERTION (PACKS) ×2 IMPLANT
WIRE ACUITY WHISPER EDS 4648 (WIRE) ×2 IMPLANT
WIRE MAILMAN 182CM (WIRE) ×2 IMPLANT

## 2019-05-05 NOTE — Interval H&P Note (Signed)
History and Physical Interval Note:  05/05/2019 10:24 AM  David [name]  has presented today for surgery, with the diagnosis of eri.  The various methods of treatment have been discussed with the patient and family. After consideration of risks, benefits and other options for treatment, the patient has consented to  Procedure(s): PPM GENERATOR CHANGEOUT (N/A) BIV UPGRADE (N/A) as a surgical intervention.  The patient's history has been reviewed, patient examined, no change in status, stable for surgery.  I have reviewed the patient's chart and labs.  Questions were answered to the patient's satisfaction.    EP Attending The patient has class 2 CHF, no chest pain but has had a fall of his LV function down to 35-40% from 50%. I have recommended upgrade to a biv PPM as he has pacing induced LBBB.  [name]

## 2019-05-05 NOTE — Discharge Instructions (Signed)
05/06/2019 (tomorrow) PLEASE SEND A REMOTE DEVICE TRANSMISSION        Supplemental Discharge Instructions for  Pacemaker/Defibrillator Patients  Activity No heavy lifting or vigorous activity with your left/right arm for 6 to 8 weeks.  Do not raise your left arm above your head for one week.  Gradually raise your affected arm as drawn below.             05/09/2019               05/10/2019              05/11/2019             05/12/2019 __  NO DRIVING for  1 week   ; you may begin driving on  [id number]  .  WOUND CARE - Keep the wound area clean and dry.  Do not get this area wet for 10 days. You may shower on  05/16/2019 . - You can remove the arm sling tomorrow 05/06/2019 - Please send a device transmission tomorrow 05/06/2019 - You can remove the outer plastic dressing tomorrow 05/06/2019, LEAVE the steri strips/paper tapes underneath in place - The tape/steri-strips on your wound will fall off; do not pull them off.  No bandage is needed on the site.  DO  NOT apply any creams, oils, or ointments to the wound area. - If you notice any drainage or discharge from the wound, any swelling or bruising at the site, or you develop a fever > 101? F after you are discharged home, call the office at once.  Special Instructions - You are still able to use cellular telephones; use the ear opposite the side where you have your pacemaker/defibrillator.  Avoid carrying your cellular phone near your device. - When traveling through airports, show security personnel your identification card to avoid being screened in the metal detectors.  Ask the security personnel to use the hand wand. - Avoid arc welding equipment, MRI testing (magnetic resonance imaging), TENS units (transcutaneous nerve stimulators).  Call the office for questions about other devices. - Avoid electrical appliances that are in poor condition or are not properly grounded. - Microwave ovens are safe to be near or to operate.

## 2019-05-05 NOTE — Progress Notes (Signed)
Jennings [employer] PA called and stated Dr Lovena Le had reviewed pt's EKG and pt was cleared for discharge

## 2019-05-05 NOTE — Progress Notes (Signed)
Wound care and activity restrictions were reviewed with the patient Planned for remote transmission tomorrow, I have made our device clinic aware  Tommye Standard, PA-C

## 2019-05-06 ENCOUNTER — Telehealth: Payer: Self-pay

## 2019-05-06 DIAGNOSIS — R931 Abnormal findings on diagnostic imaging of heart and coronary circulation: Secondary | ICD-10-CM

## 2019-05-06 MED FILL — Fentanyl Citrate Preservative Free (PF) Inj 100 MCG/2ML: INTRAMUSCULAR | Qty: 2 | Status: AC

## 2019-05-06 MED FILL — Midazolam HCl Inj 5 MG/5ML (Base Equivalent): INTRAMUSCULAR | Qty: 5 | Status: AC

## 2019-05-06 MED FILL — Gentamicin Sulfate Inj 40 MG/ML: INTRAMUSCULAR | Qty: 80 | Status: AC

## 2019-05-06 NOTE — Telephone Encounter (Signed)
-----   Message from [place], [place] sent at 05/05/2019  2:31 PM EST ----- Same day discharge, please follow up on next day transmission New LV lead only Thanks renee

## 2019-05-06 NOTE — Telephone Encounter (Signed)
The patient has been notified of the result and recommendations. Patient verbalized understanding. Orders placed for PYP scan, myeloma panel, and urine immunofixation. Patient aware that he will be contacted by [facility] team to schedule. All questions (if any) were answered. Cleon Gustin, RN 05/06/2019 5:12 PM

## 2019-05-06 NOTE — Telephone Encounter (Signed)
-----   Message from Jettie Booze, MD sent at 05/06/2019 12:07 PM EST ----- On echo, ehart muscle is a little weaker.  Will get a different type of scan of his heart to look for a cause.  Will also check blood test and urine test also.  Please see below.  Thanks.  JV ----- Message ----- From: Larey Dresser, MD Sent: 05/05/2019  12:54 PM EST To: Jettie Booze, MD  For amyloid workup, would get PYP scan as well as myeloma panel and urine immunofixation.

## 2019-05-06 NOTE — Telephone Encounter (Signed)
I spoke with the pt and had him send a manual transmission. Transmission received.

## 2019-05-10 ENCOUNTER — Ambulatory Visit (HOSPITAL_COMMUNITY): Payer: Medicare HMO | Attending: Cardiology

## 2019-05-10 ENCOUNTER — Other Ambulatory Visit: Payer: Medicare HMO | Admitting: *Deleted

## 2019-05-10 ENCOUNTER — Other Ambulatory Visit: Payer: Self-pay

## 2019-05-10 DIAGNOSIS — R931 Abnormal findings on diagnostic imaging of heart and coronary circulation: Secondary | ICD-10-CM

## 2019-05-10 MED ORDER — TECHNETIUM TC 99M PYROPHOSPHATE
21.6000 | Freq: Once | INTRAVENOUS | Status: AC
  Administered 2019-05-10: 21.6 via INTRAVENOUS

## 2019-05-11 LAB — IMMUNOFIXATION, URINE

## 2019-05-13 ENCOUNTER — Telehealth: Payer: Self-pay | Admitting: Interventional Cardiology

## 2019-05-13 NOTE — Telephone Encounter (Signed)
Patient states that [place], Dr. Hassell Done nurse, was to call him back yesterday but he never heard anything. Calling back to follow up.

## 2019-05-13 NOTE — Telephone Encounter (Signed)
I spoke to the patient and mentioned that he should be hearing from our schedulers to arrange next appointments for tests/labs, etc.  He verbalized understanding.

## 2019-05-14 LAB — MULTIPLE MYELOMA PANEL, SERUM
Albumin SerPl Elph-Mcnc: 3.7 g/dL (ref 2.9–4.4)
Albumin/Glob SerPl: 1.3 (ref 0.7–1.7)
Alpha 1: 0.2 g/dL (ref 0.0–0.4)
Alpha2 Glob SerPl Elph-Mcnc: 0.9 g/dL (ref 0.4–1.0)
B-Globulin SerPl Elph-Mcnc: 1 g/dL (ref 0.7–1.3)
Gamma Glob SerPl Elph-Mcnc: 0.9 g/dL (ref 0.4–1.8)
Globulin, Total: 3 g/dL (ref 2.2–3.9)
IgA/Immunoglobulin A, Serum: 182 mg/dL (ref 61–437)
IgG (Immunoglobin G), Serum: 963 mg/dL (ref 603–1613)
IgM (Immunoglobulin M), Srm: 93 mg/dL (ref 15–143)
Total Protein: 6.7 g/dL (ref 6.0–8.5)

## 2019-05-18 ENCOUNTER — Other Ambulatory Visit: Payer: Self-pay

## 2019-05-18 ENCOUNTER — Ambulatory Visit (INDEPENDENT_AMBULATORY_CARE_PROVIDER_SITE_OTHER): Payer: Medicare HMO | Admitting: Student

## 2019-05-18 DIAGNOSIS — R931 Abnormal findings on diagnostic imaging of heart and coronary circulation: Secondary | ICD-10-CM

## 2019-05-18 DIAGNOSIS — I442 Atrioventricular block, complete: Secondary | ICD-10-CM

## 2019-05-18 LAB — CUP PACEART INCLINIC DEVICE CHECK
Battery Remaining Longevity: 32 mo
Battery Voltage: 2.98 V
Brady Statistic RA Percent Paced: 99 %
Brady Statistic RV Percent Paced: 99.99 %
Date Time Interrogation Session: 20201222161502
Implantable Lead Implant Date: 20121031
Implantable Lead Implant Date: 20121031
Implantable Lead Implant Date: 20201209
Implantable Lead Implant Date: 20201209
Implantable Lead Location: 753858
Implantable Lead Location: 753859
Implantable Lead Location: 753860
Implantable Lead Location: 753862
Implantable Lead Model: 3830
Implantable Pulse Generator Implant Date: 20201209
Lead Channel Impedance Value: 312.5 Ohm
Lead Channel Impedance Value: 387.5 Ohm
Lead Channel Impedance Value: 425 Ohm
Lead Channel Pacing Threshold Amplitude: 0.5 V
Lead Channel Pacing Threshold Amplitude: 0.5 V
Lead Channel Pacing Threshold Amplitude: 0.75 V
Lead Channel Pacing Threshold Amplitude: 0.75 V
Lead Channel Pacing Threshold Amplitude: 1 V
Lead Channel Pacing Threshold Amplitude: 1 V
Lead Channel Pacing Threshold Pulse Width: 0.5 ms
Lead Channel Pacing Threshold Pulse Width: 0.5 ms
Lead Channel Pacing Threshold Pulse Width: 0.8 ms
Lead Channel Pacing Threshold Pulse Width: 0.8 ms
Lead Channel Pacing Threshold Pulse Width: 1 ms
Lead Channel Pacing Threshold Pulse Width: 1 ms
Lead Channel Sensing Intrinsic Amplitude: 2.1 mV
Lead Channel Setting Pacing Amplitude: 2 V
Lead Channel Setting Pacing Amplitude: 2.5 V
Lead Channel Setting Pacing Amplitude: 3.5 V
Lead Channel Setting Pacing Pulse Width: 0.8 ms
Lead Channel Setting Pacing Pulse Width: 1 ms
Lead Channel Setting Sensing Sensitivity: 4 mV
Pulse Gen Model: 3222
Pulse Gen Serial Number: 9138824

## 2019-05-18 NOTE — Progress Notes (Signed)
Wound check appointment. Steri-strips removed. Wound without redness or edema. Incision edges approximated, wound well healed. Normal device function. Thresholds, sensing, and impedances consistent with implant measurements for chronic leads. New LV lead programmed at 3.5V/auto capture programmed on for extra safety margin until 3 month visit. Histogram distribution appropriate for patient and level of activity. No mode switches or high ventricular rates noted. Patient educated about wound care, arm mobility, lifting restrictions. ROV in 3 months with Dr. Lovena Le

## 2019-06-01 NOTE — Telephone Encounter (Signed)
Called and spoke to patient regarding [employer]. Made patient aware that we are working on gathering his records and images for his echo and amyloid scan. Patient is requesting that his records from both Dr. Lovena Le and Dr. Irish Lack (office notes, labs, testing, etc.) be sent to the [facility]. Made him aware that he will need to reach out to Dr. Arman Filter and Dr. Carlyle Lipa office for their records to be sent. Made patient aware that we are not able to "push" the images to the [facility], but they are being put on a disc and Medical Records is planning on sending all records together through the mail. Made patient aware that I will contact him and let him know once we gather everything and let him know once we send it. Made patient aware that I have made Medical Records and the Imaging supervisors aware that these records need to be at the [facility] before his appt on January  18th. Patient verbalized understanding and thanked me for the call.

## 2019-06-10 ENCOUNTER — Telehealth (HOSPITAL_COMMUNITY): Payer: Self-pay

## 2019-06-10 ENCOUNTER — Ambulatory Visit: Payer: Medicare Other | Attending: Internal Medicine

## 2019-06-10 DIAGNOSIS — Z23 Encounter for immunization: Secondary | ICD-10-CM | POA: Insufficient documentation

## 2019-06-10 NOTE — Progress Notes (Signed)
   Covid-19 Vaccination Clinic  Name:  David [name]    MRN: [id number] DOB: 01-19-1941  06/10/2019  David [name] was observed post Covid-19 immunization for 30 minutes based on pre-vaccination screening without incidence. He was provided with Vaccine Information Sheet and instruction to access the V-Safe system.   David [name] was instructed to call 911 with any severe reactions post vaccine: [name] Difficulty breathing  . Swelling of your face and throat  . A fast heartbeat  . A bad rash all over your body  . Dizziness and weakness    Immunizations Administered    Name Date Dose VIS Date Route   Pfizer COVID-19 Vaccine 06/10/2019 11:28 AM 0.3 mL 05/07/2019 Intramuscular   Manufacturer: [place]   Lot: [id number]   [place]: [id number]

## 2019-06-10 NOTE — Telephone Encounter (Signed)
COVID-19 pre-appointment screening questions:   Do you have a history of COVID-19 or a positive test result in the past 7-10 days?  To the best of your knowledge, have you been in close contact with anyone with a confirmed diagnosis of COVID 19?  Have you had any one or more of the following: Fever, chills, cough, shortness of breath (out of the normal for you) or any flu-like symptoms?  Are you experiencing any of the following symptoms that is new or out of usual for you:  . Ear, nose or throat discomfort . Sore throat . Headache . Muscle Pain . Diarrhea . Loss of taste or smell   Reviewed all the following with patient: . Use of hand sanitizer when entering the building . Everyone is required to wear a mask in the building, if you do not have a mask we are happy to provide you with one when you arrive . NO Visitor guidelines   If patient answers YES to any of questions they must change to a virtual visit and place note in comments about symptoms

## 2019-06-10 NOTE — Telephone Encounter (Signed)

## 2019-06-11 ENCOUNTER — Encounter (HOSPITAL_COMMUNITY): Payer: Self-pay | Admitting: Cardiology

## 2019-06-11 ENCOUNTER — Ambulatory Visit (HOSPITAL_COMMUNITY)
Admission: RE | Admit: 2019-06-11 | Discharge: 2019-06-11 | Disposition: A | Discharge status: Home / Self Care | Payer: Medicare HMO | Source: Ambulatory Visit | Attending: Cardiology | Admitting: Cardiology

## 2019-06-11 ENCOUNTER — Other Ambulatory Visit: Payer: Self-pay

## 2019-06-11 VITALS — BP 121/59 | HR 70 | Wt 192.2 lb

## 2019-06-11 DIAGNOSIS — Z7901 Long term (current) use of anticoagulants: Secondary | ICD-10-CM | POA: Diagnosis not present

## 2019-06-11 DIAGNOSIS — Z79899 Other long term (current) drug therapy: Secondary | ICD-10-CM | POA: Insufficient documentation

## 2019-06-11 DIAGNOSIS — N189 Chronic kidney disease, unspecified: Secondary | ICD-10-CM | POA: Diagnosis not present

## 2019-06-11 DIAGNOSIS — R0600 Dyspnea, unspecified: Secondary | ICD-10-CM

## 2019-06-11 DIAGNOSIS — R0609 Other forms of dyspnea: Secondary | ICD-10-CM

## 2019-06-11 DIAGNOSIS — Z801 Family history of malignant neoplasm of trachea, bronchus and lung: Secondary | ICD-10-CM | POA: Diagnosis not present

## 2019-06-11 DIAGNOSIS — Z87891 Personal history of nicotine dependence: Secondary | ICD-10-CM | POA: Insufficient documentation

## 2019-06-11 DIAGNOSIS — G4733 Obstructive sleep apnea (adult) (pediatric): Secondary | ICD-10-CM | POA: Insufficient documentation

## 2019-06-11 DIAGNOSIS — Z950 Presence of cardiac pacemaker: Secondary | ICD-10-CM | POA: Diagnosis not present

## 2019-06-11 DIAGNOSIS — I130 Hypertensive heart and chronic kidney disease with heart failure and stage 1 through stage 4 chronic kidney disease, or unspecified chronic kidney disease: Secondary | ICD-10-CM | POA: Insufficient documentation

## 2019-06-11 DIAGNOSIS — I480 Paroxysmal atrial fibrillation: Secondary | ICD-10-CM | POA: Diagnosis not present

## 2019-06-11 DIAGNOSIS — E1122 Type 2 diabetes mellitus with diabetic chronic kidney disease: Secondary | ICD-10-CM | POA: Insufficient documentation

## 2019-06-11 DIAGNOSIS — I5022 Chronic systolic (congestive) heart failure: Secondary | ICD-10-CM | POA: Diagnosis not present

## 2019-06-11 DIAGNOSIS — Z8249 Family history of ischemic heart disease and other diseases of the circulatory system: Secondary | ICD-10-CM | POA: Diagnosis not present

## 2019-06-11 DIAGNOSIS — I5032 Chronic diastolic (congestive) heart failure: Secondary | ICD-10-CM

## 2019-06-11 DIAGNOSIS — E1142 Type 2 diabetes mellitus with diabetic polyneuropathy: Secondary | ICD-10-CM | POA: Insufficient documentation

## 2019-06-11 DIAGNOSIS — Z833 Family history of diabetes mellitus: Secondary | ICD-10-CM | POA: Diagnosis not present

## 2019-06-11 DIAGNOSIS — I442 Atrioventricular block, complete: Secondary | ICD-10-CM | POA: Insufficient documentation

## 2019-06-11 DIAGNOSIS — I2510 Atherosclerotic heart disease of native coronary artery without angina pectoris: Secondary | ICD-10-CM | POA: Insufficient documentation

## 2019-06-11 DIAGNOSIS — I350 Nonrheumatic aortic (valve) stenosis: Secondary | ICD-10-CM | POA: Insufficient documentation

## 2019-06-11 DIAGNOSIS — N183 Chronic kidney disease, stage 3 unspecified: Secondary | ICD-10-CM | POA: Diagnosis not present

## 2019-06-11 LAB — BASIC METABOLIC PANEL
Anion gap: 12 (ref 5–15)
BUN: 36 mg/dL — ABNORMAL HIGH (ref 8–23)
CO2: 27 mmol/L (ref 22–32)
Calcium: 9.5 mg/dL (ref 8.9–10.3)
Chloride: 102 mmol/L (ref 98–111)
Creatinine, Ser: 1.63 mg/dL — ABNORMAL HIGH (ref 0.61–1.24)
GFR calc Af Amer: 45 mL/min — ABNORMAL LOW (ref >60)
GFR calc non Af Amer: 39 mL/min — ABNORMAL LOW (ref >60)
Glucose, Bld: 128 mg/dL — ABNORMAL HIGH (ref 70–99)
Potassium: 3.9 mmol/L (ref 3.5–5.1)
Sodium: 141 mmol/L (ref 135–145)

## 2019-06-11 MED ORDER — TAFAMIDIS 61 MG PO CAPS: 61.0000 | ORAL_CAPSULE | Freq: Every day | ORAL | 11 refills | Status: DC

## 2019-06-11 MED ORDER — TORSEMIDE 20 MG PO TABS: ORAL_TABLET | ORAL | 5 refills | Status: DC

## 2019-06-11 MED ORDER — METOPROLOL SUCCINATE ER 25 MG PO TB24: 25.0000 mg | ORAL_TABLET | Freq: Every day | ORAL | 5 refills | Status: DC

## 2019-06-11 NOTE — Progress Notes (Signed)
Blood collected for TTR genetic testing per Dr Mclean.  Order form completed and both shipped by FedEx to [facility]. 

## 2019-06-11 NOTE — Patient Instructions (Addendum)
STOP Metoprolol tartrate   START Toprol XL 25mg  ( 1 tab) daily   INCREASE Torsemide to 40mg  (2 tabs) in the morning and 20mg  (1 tab) in the evening   START Tafamidis 61mg  (1 capsule) daily (when approved by insurance)   Labs today and repeat in 10 days We will only contact you if something comes back abnormal or we need to make some changes. Otherwise no news is good news!   Genetic test has been done, this has to be sent to [place] to be processed and can take 1-2 weeks to get results back.  We will let you know the results.   Your physician recommends that you schedule a follow-up appointment in: 3 weeks with Dr Aundra Dubin   Please call office at [phone] option [phone] if you have any questions or concerns.     At the [facility], you and your health needs are our priority. As part of our continuing mission to provide you with exceptional heart care, we have created designated Provider Care Teams. These Care Teams include your primary Cardiologist (physician) and Advanced Practice Providers (APPs- Physician Assistants and Nurse Practitioners) who all work together to provide you with the care you need, when you need it.   You may see any of the following providers on your designated Care Team at your next follow up: [name] Dr Glori Bickers . Dr Loralie Champagne . Darrick Grinder, NP . Lyda Jester, PA . Audry Riles, PharmD   Please be sure to bring in all your medications bottles to every appointment.

## 2019-06-12 NOTE — Progress Notes (Signed)
PCP: Lajean Manes, MD Cardiology: Dr. Irish Lack HF Cardiology: Dr. Aundra Dubin  79 y.o. with history of complete heart block, CKD stage 3, paroxysmal atrial fibrillation, CAD, and chronic systolic CHF was referred by Dr. Irish Lack for evaluation of possible cardiac amyloidosis.  In 1999, he had LAD atherectomy.  Patient developed complete heart block in 2012 with PPM placement.  He had a stress PET done at that time that was not suggestive of ischemia.   Paroxysmal atrial fibrillation was noted during device interrogation and apixaban was started.  Echo in 12/19 was noted to show EF 45-50% with severe LVH.    About 3 months ago, he began to develop significant exertional dyspnea.  He is now short of breath walking up stairs or walking around the block.  No orthopnea/PND.  No chest pain.  He has tingling and numbness in his feet bilaterally. No lightheadedness/orthostatic symptoms. He has had significant peripheral edema.   Echo was done again in 12/20, showing EF 35-40%, severe LVH with concern for amyloidosis, moderate RV dysfunction, low gradient moderate AS, IVC dilated.  Given concern for cardiac amyloidosis, urine immunofixation was done and was negative, and serum myeloma panel was done and was negative.  PYP scan was done and was suggestive of cardiac amyloidosis.   Labs (11/20): K 4.7, creatinine 1.12 Labs (12/20): Urine immunofixation and serum myeloma panel negative.   ECG: A-V dual pacing   St Jude device interrogation: >99% BiV pacing, no AF/VT, Corvue not yet set (recent implant).   PMH: 1. Complete heart block: PPM placed 11/12.  2. CKD: Stage 3.  3. Type 2 diabetes  4. OSA 5. H/o spinal stenosis 6. HTN 7. Atrial fibrillation: Paroxysmal.  8. Gibert's disease 9. Aortic stenosis: Echo in 12/20 with low gradient moderate AS. .  10. CAD: LAD atherectomy in 1999.  11. Chronic systolic CHF: St Jude CRT-P device upgrade.  - Echo (12/20): EF 35-40%, severe LVH, looks like amyloidosis,  moderately decreased RV systolic function, mild MR, low gradient moderate AS (10 mmHg, 1.1 cm^2), IVC dilated.  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 Urine immunofixation and serum myeloma panel negative.    Social History   Socioeconomic History  . Marital status: Married    Spouse name: Not on file  . Number of children: Not on file  . Years of education: Not on file  . Highest education level: Not on file  Occupational History  . Not on file  Tobacco Use  . Smoking status: Former Research scientist (life sciences)  . Smokeless tobacco: Never Used  Substance and Sexual Activity  . Alcohol use: Yes    Comment: occasional glass of wine  . Drug use: No  . Sexual activity: Not on file  Other Topics Concern  . Not on file  Social History Narrative  . Not on file   Social Determinants of Health   Financial Resource Strain:   . Difficulty of Paying Living Expenses: Not on file  Food Insecurity:   . Worried About Charity fundraiser in the Last Year: Not on file  . Ran Out of Food in the Last Year: Not on file  Transportation Needs:   . Lack of Transportation (Medical): Not on file  . Lack of Transportation (Non-Medical): Not on file  Physical Activity:   . Days of Exercise per Week: Not on file  . Minutes of Exercise per Session: Not on file  Stress:   .  Feeling of Stress : Not on file  Social Connections:   . Frequency of Communication with Friends and Family: Not on file  . Frequency of Social Gatherings with Friends and Family: Not on file  . Attends Religious Services: Not on file  . Active Member of Clubs or Organizations: Not on file  . Attends Archivist Meetings: Not on file  . Marital Status: Not on file  Intimate Partner Violence:   . Fear of Current or Ex-Partner: Not on file  . Emotionally Abused: Not on file  . Physically Abused: Not on file  . Sexually Abused: Not on file   Family  History  Problem Relation Age of Onset  . Cancer Mother        Small cell lung cancer  . Diabetes Father        DM  . CAD Father   . Hypertension Father   . Heart disease Father   . Heart attack Father   . Hypertension Sister    ROS: All systems reviewed and negative except as per HPI.   BP (!) 121/59   Pulse 70   Wt 87.2 kg (192 lb 3.2 oz)   SpO2 99%   BMI 26.07 kg/m  General: NAD Neck: JVP 10-12 cm, no thyromegaly or thyroid nodule.  Lungs: Clear to auscultation bilaterally with normal respiratory effort. CV: Nondisplaced PMI.  Heart regular S1/S2, no S3/S4, no murmur. 2+ edema to knees bilaterally.   Abdomen: Soft, nontender, no hepatosplenomegaly, no distention.  Skin: Intact without lesions or rashes.  Neurologic: Alert and oriented x 3.  Psych: Normal affect. Extremities: No clubbing or cyanosis.  HEENT: Normal.   Assessment/Plan: 1. Chronic systolic CHF: Echo from 123456 was reviewed, showed EF around 35-40% with severe LVH with appearance concerning for cardiac amyloidosis, moderate RV dysfunction, low gradient moderate AS.  He has renal dysfunction and symptoms consistent with peripheral neuropathy.  He has also had cardiac conduction abnormalities.  Presentation is concerning for cardiac amyloidosis, with slow progressive more suggestive of ATTR amyloidosis.  Myeloma panel and urine immunofixation negative so AL amyloidosis unlikely.  PYP scan was reviewed and markedly positive, suggesting ATTR amyloidosis.  He has history of CAD but no recent chest pain, cannot fully rule out ischemic cardiomyopathy but unlikely.  NYHA class III symptoms with volume overload on exam.  - Strong suspicion for ATTR amyloidosis => will send gene testing for ATTR amyloidosis today and will work on getting him started on tafamidis.  - Given volume overload, increase torsemide to 40 qam/20 qpm.  BMET today and again in 10 days.   - Wear graded compression stockings.  - Stop metoprolol tartrate  and start Toprol XL 25 mg daily.  - If creatinine stable, would start Entresto next appt.  - I recommended Lexiscan Cardiolite to rule out ischemia (think CAD progression unlikely).  - I recommended RHC to assess filling pressures and cardiac output.  - He has a virtual appt with [facility] on Monday regarding amyloidosis.  Will check in with him after this to set up cath and Cardiolite.  2. CAD: H/o LAD atherectomy in 1999.  No chest pain. I think progressive CAD is unlikely as cause of worsening LV function.  - He is on Eliquis so no ASA.  - Continue statin.  3. Atrial fibrillation: Paroxysmal. NSR today.  - Continue apixaban, using 2.5 mg bid dosing with creatinine > 1.5 and age > 68.  4. CKD: Stage 3.  BMET today.  5. Aortic stenosis: Low gradient moderate AS on 12/20 echo.  6. Peripheral neuropathy: Possibly amyloid-related.  He has diet controlled diabetes so unlikely diabetic neuropathy.  7. Complete heart block: St Jude CRT-P system.   Followup 3 wks with me.  Loralie Champagne 06/12/2019

## 2019-06-14 ENCOUNTER — Other Ambulatory Visit (HOSPITAL_COMMUNITY): Payer: Self-pay

## 2019-06-14 DIAGNOSIS — I5032 Chronic diastolic (congestive) heart failure: Secondary | ICD-10-CM

## 2019-06-14 DIAGNOSIS — E859 Amyloidosis, unspecified: Secondary | ICD-10-CM | POA: Diagnosis not present

## 2019-06-14 NOTE — Progress Notes (Signed)
Per Dr Aundra Dubin orders for serum light chains and 24 hr urine for light chains placed. Pt due for lab visit on 1/25.

## 2019-06-15 ENCOUNTER — Telehealth (HOSPITAL_COMMUNITY): Payer: Self-pay | Admitting: Pharmacist

## 2019-06-15 NOTE — Telephone Encounter (Signed)
Sent in [employer] application to [employer] for [employer]).   Fax: [phone]  Application pending, will continue to follow.  Audry Riles, PharmD, BCPS, BCCP, CPP Heart Failure Clinic Pharmacist [phone]

## 2019-06-15 NOTE — Telephone Encounter (Signed)
Advanced Heart Failure Patient Advocate Encounter  Prior Authorization for Vyndamax (tafamidis) has been approved.    PA# [id number] Effective dates: 05/16/2019 through 06/14/2020  Patients co-pay is $243.09  Audry Riles, PharmD, BCPS, BCCP, CPP Heart Failure Clinic Pharmacist [phone]

## 2019-06-21 ENCOUNTER — Other Ambulatory Visit: Payer: Self-pay

## 2019-06-21 ENCOUNTER — Ambulatory Visit (HOSPITAL_COMMUNITY)
Admission: RE | Admit: 2019-06-21 | Discharge: 2019-06-21 | Disposition: A | Discharge status: Home / Self Care | Payer: Medicare HMO | Source: Ambulatory Visit | Attending: Internal Medicine | Admitting: Internal Medicine

## 2019-06-21 ENCOUNTER — Other Ambulatory Visit (HOSPITAL_COMMUNITY): Payer: Self-pay

## 2019-06-21 DIAGNOSIS — I5032 Chronic diastolic (congestive) heart failure: Secondary | ICD-10-CM | POA: Diagnosis present

## 2019-06-21 LAB — BASIC METABOLIC PANEL
Anion gap: 11 (ref 5–15)
BUN: 31 mg/dL — ABNORMAL HIGH (ref 8–23)
CO2: 30 mmol/L (ref 22–32)
Calcium: 9.4 mg/dL (ref 8.9–10.3)
Chloride: 99 mmol/L (ref 98–111)
Creatinine, Ser: 1.65 mg/dL — ABNORMAL HIGH (ref 0.61–1.24)
GFR calc Af Amer: 44 mL/min — ABNORMAL LOW (ref >60)
GFR calc non Af Amer: 38 mL/min — ABNORMAL LOW (ref >60)
Glucose, Bld: 161 mg/dL — ABNORMAL HIGH (ref 70–99)
Potassium: 3.6 mmol/L (ref 3.5–5.1)
Sodium: 140 mmol/L (ref 135–145)

## 2019-06-22 ENCOUNTER — Encounter (HOSPITAL_COMMUNITY): Payer: Self-pay

## 2019-06-23 ENCOUNTER — Other Ambulatory Visit (HOSPITAL_COMMUNITY): Payer: Medicare Other

## 2019-06-23 ENCOUNTER — Other Ambulatory Visit (HOSPITAL_COMMUNITY): Payer: Self-pay

## 2019-06-23 DIAGNOSIS — I5032 Chronic diastolic (congestive) heart failure: Secondary | ICD-10-CM

## 2019-06-24 ENCOUNTER — Ambulatory Visit (HOSPITAL_COMMUNITY)
Admission: RE | Admit: 2019-06-24 | Discharge: 2019-06-24 | Disposition: A | Discharge status: Home / Self Care | Payer: Medicare HMO | Source: Ambulatory Visit | Attending: Cardiology | Admitting: Cardiology

## 2019-06-24 ENCOUNTER — Other Ambulatory Visit: Payer: Self-pay

## 2019-06-24 DIAGNOSIS — I5032 Chronic diastolic (congestive) heart failure: Secondary | ICD-10-CM

## 2019-06-25 LAB — KAPPA/LAMBDA LIGHT CHAINS
Kappa free light chain: 46.8 mg/L — ABNORMAL HIGH (ref 3.3–19.4)
Kappa, lambda light chain ratio: 1.89 — ABNORMAL HIGH (ref 0.26–1.65)
Lambda free light chains: 24.8 mg/L (ref 5.7–26.3)

## 2019-06-26 LAB — KAPPA/LAMBDA LIGHT CHAINS, FREE, WITH RATIO, 24HR. URINE
FR KAPPA LT CH,24HR: 34.35 mg/24 hr
FR LAMBDA LT CH,24HR: <1.92 mg/24 hr
Free Kappa Lt Chains,Ur: 11.45 mg/L (ref 0.63–113.79)
Free Kappa/Lambda Ratio: >17.89 (ref 1.03–31.76)
Free Lambda Lt Chains,Ur: <0.64 mg/L (ref 0.47–11.77)
Total Volume: 3000

## 2019-06-27 ENCOUNTER — Encounter (HOSPITAL_COMMUNITY): Payer: Self-pay

## 2019-06-28 ENCOUNTER — Ambulatory Visit (HOSPITAL_COMMUNITY)
Admission: RE | Admit: 2019-06-28 | Discharge: 2019-06-28 | Disposition: A | Discharge status: Home / Self Care | Payer: Medicare HMO | Source: Ambulatory Visit | Attending: Cardiology | Admitting: Cardiology

## 2019-06-28 ENCOUNTER — Other Ambulatory Visit: Payer: Self-pay

## 2019-06-28 ENCOUNTER — Encounter (HOSPITAL_COMMUNITY): Payer: Self-pay | Admitting: Cardiology

## 2019-06-28 VITALS — BP 102/63 | HR 70 | Wt 186.1 lb

## 2019-06-28 DIAGNOSIS — I130 Hypertensive heart and chronic kidney disease with heart failure and stage 1 through stage 4 chronic kidney disease, or unspecified chronic kidney disease: Secondary | ICD-10-CM | POA: Insufficient documentation

## 2019-06-28 DIAGNOSIS — N183 Chronic kidney disease, stage 3 unspecified: Secondary | ICD-10-CM | POA: Diagnosis not present

## 2019-06-28 DIAGNOSIS — E854 Organ-limited amyloidosis: Secondary | ICD-10-CM

## 2019-06-28 DIAGNOSIS — Z87891 Personal history of nicotine dependence: Secondary | ICD-10-CM | POA: Diagnosis not present

## 2019-06-28 DIAGNOSIS — E1142 Type 2 diabetes mellitus with diabetic polyneuropathy: Secondary | ICD-10-CM | POA: Diagnosis not present

## 2019-06-28 DIAGNOSIS — I442 Atrioventricular block, complete: Secondary | ICD-10-CM | POA: Diagnosis not present

## 2019-06-28 DIAGNOSIS — I480 Paroxysmal atrial fibrillation: Secondary | ICD-10-CM

## 2019-06-28 DIAGNOSIS — Z950 Presence of cardiac pacemaker: Secondary | ICD-10-CM | POA: Insufficient documentation

## 2019-06-28 DIAGNOSIS — I5022 Chronic systolic (congestive) heart failure: Secondary | ICD-10-CM

## 2019-06-28 DIAGNOSIS — I350 Nonrheumatic aortic (valve) stenosis: Secondary | ICD-10-CM

## 2019-06-28 DIAGNOSIS — Z833 Family history of diabetes mellitus: Secondary | ICD-10-CM | POA: Diagnosis not present

## 2019-06-28 DIAGNOSIS — E1122 Type 2 diabetes mellitus with diabetic chronic kidney disease: Secondary | ICD-10-CM | POA: Insufficient documentation

## 2019-06-28 DIAGNOSIS — Z7901 Long term (current) use of anticoagulants: Secondary | ICD-10-CM | POA: Insufficient documentation

## 2019-06-28 DIAGNOSIS — I2510 Atherosclerotic heart disease of native coronary artery without angina pectoris: Secondary | ICD-10-CM | POA: Diagnosis not present

## 2019-06-28 DIAGNOSIS — N189 Chronic kidney disease, unspecified: Secondary | ICD-10-CM | POA: Diagnosis not present

## 2019-06-28 DIAGNOSIS — I509 Heart failure, unspecified: Secondary | ICD-10-CM

## 2019-06-28 DIAGNOSIS — Z8249 Family history of ischemic heart disease and other diseases of the circulatory system: Secondary | ICD-10-CM | POA: Insufficient documentation

## 2019-06-28 DIAGNOSIS — I43 Cardiomyopathy in diseases classified elsewhere: Secondary | ICD-10-CM

## 2019-06-28 LAB — BASIC METABOLIC PANEL
Anion gap: 14 (ref 5–15)
BUN: 38 mg/dL — ABNORMAL HIGH (ref 8–23)
CO2: 28 mmol/L (ref 22–32)
Calcium: 9.4 mg/dL (ref 8.9–10.3)
Chloride: 97 mmol/L — ABNORMAL LOW (ref 98–111)
Creatinine, Ser: 1.59 mg/dL — ABNORMAL HIGH (ref 0.61–1.24)
GFR calc Af Amer: 46 mL/min — ABNORMAL LOW (ref >60)
GFR calc non Af Amer: 40 mL/min — ABNORMAL LOW (ref >60)
Glucose, Bld: 113 mg/dL — ABNORMAL HIGH (ref 70–99)
Potassium: 4.1 mmol/L (ref 3.5–5.1)
Sodium: 139 mmol/L (ref 135–145)

## 2019-06-28 LAB — CBC
HCT: 37 % — ABNORMAL LOW (ref 39.0–52.0)
Hemoglobin: 11.9 g/dL — ABNORMAL LOW (ref 13.0–17.0)
MCH: 31.6 pg (ref 26.0–34.0)
MCHC: 32.2 g/dL (ref 30.0–36.0)
MCV: 98.1 fL (ref 80.0–100.0)
Platelets: 136 10*3/uL — ABNORMAL LOW (ref 150–400)
RBC: 3.77 MIL/uL — ABNORMAL LOW (ref 4.22–5.81)
RDW: 13.7 % (ref 11.5–15.5)
WBC: 7.1 10*3/uL (ref 4.0–10.5)
nRBC: 0 % (ref 0.0–0.2)

## 2019-06-28 LAB — TROPONIN I (HIGH SENSITIVITY): Troponin I (High Sensitivity): 76 ng/L — ABNORMAL HIGH (ref <18)

## 2019-06-28 LAB — TSH: TSH: 4.555 u[IU]/mL — ABNORMAL HIGH (ref 0.350–4.500)

## 2019-06-28 LAB — BRAIN NATRIURETIC PEPTIDE: B Natriuretic Peptide: 464 pg/mL — ABNORMAL HIGH (ref 0.0–100.0)

## 2019-06-28 MED ORDER — SPIRONOLACTONE 25 MG PO TABS: 12.5000 mg | ORAL_TABLET | Freq: Every day | ORAL | 3 refills | Status: DC

## 2019-06-28 MED ORDER — METOPROLOL SUCCINATE ER 25 MG PO TB24: 25.0000 mg | ORAL_TABLET | Freq: Every day | ORAL | 3 refills | Status: DC

## 2019-06-28 MED ORDER — TORSEMIDE 20 MG PO TABS: 40.0000 mg | ORAL_TABLET | Freq: Two times a day (BID) | ORAL | 5 refills | Status: DC

## 2019-06-28 NOTE — Progress Notes (Signed)
PCP: Lajean Manes, MD Cardiology: Dr. Irish Lack HF Cardiology: Dr. Aundra Dubin  79 y.o. with history of complete heart block, CKD stage 3, paroxysmal atrial fibrillation, CAD, and chronic systolic CHF was referred by Dr. Irish Lack for evaluation of possible cardiac amyloidosis.  In 1999, he had LAD atherectomy.  Patient developed complete heart block in 2012 with PPM placement.  He had a stress PET done at that time that was not suggestive of ischemia.   Paroxysmal atrial fibrillation was noted during device interrogation and apixaban was started.  Echo in 12/19 was noted to show EF 45-50% with severe LVH.    Echo was done again in 12/20, showing EF 35-40%, severe LVH with concern for amyloidosis, moderate RV dysfunction, low gradient moderate AS, IVC dilated.  Given concern for cardiac amyloidosis, urine immunofixation was done and was negative, and serum myeloma panel was done and was negative.  PYP scan was done and was suggestive of cardiac amyloidosis.  TTR amyloidosis genetic testing was negative, suggesting wild type disease.   He returns for followup of CHF.  For the last few months, he has had significant dyspnea.  He is now short of breath walking up stairs or walking around the block (more than about 100 feet).  No orthopnea/PND.  No chest pain.  He has tingling and numbness in his feet bilaterally.  He has had significant peripheral edema.  SBP drops down to 80s-90s at times and he will feel fatigued but denies lightheadedness or falls.  Weight is down 6 lbs since torsemide was increased last appointment.   Labs (11/20): K 4.7, creatinine 1.12 Labs (12/20): Urine immunofixation and serum myeloma panel negative.  Labs (1/21): K 3.6, creatinine 1.65, mildly increased Kappa/Lamba free light chain ratio  PMH: 1. Complete heart block: PPM placed 11/12.  2. CKD: Stage 3.  3. Type 2 diabetes  4. OSA 5. H/o spinal stenosis 6. HTN 7. Atrial fibrillation: Paroxysmal.  8. Gibert's disease 9.  Aortic stenosis: Echo in 12/20 with low gradient moderate AS. .  10. CAD: LAD atherectomy in 1999.  11. Chronic systolic CHF: St Jude CRT-P device upgrade.  - Echo (12/20): EF 35-40%, severe LVH, looks like amyloidosis, moderately decreased RV systolic function, mild MR, low gradient moderate AS (10 mmHg, 1.1 cm^2), IVC dilated.  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 Urine immunofixation and serum myeloma panel negative.  Gene testing for TTR was negative suggesting wild type disease.   Social History   Socioeconomic History  . Marital status: Married    Spouse name: Not on file  . Number of children: Not on file  . Years of education: Not on file  . Highest education level: Not on file  Occupational History  . Not on file  Tobacco Use  . Smoking status: Former Research scientist (life sciences)  . Smokeless tobacco: Never Used  Substance and Sexual Activity  . Alcohol use: Yes    Comment: occasional glass of wine  . Drug use: No  . Sexual activity: Not on file  Other Topics Concern  . Not on file  Social History Narrative  . Not on file   Social Determinants of Health   Financial Resource Strain:   . Difficulty of Paying Living Expenses: Not on file  Food Insecurity:   . Worried About Charity fundraiser in the Last Year: Not on file  . Ran Out of Food in the Last Year: Not on  file  Transportation Needs:   . Film/video editor (Medical): Not on file  . Lack of Transportation (Non-Medical): Not on file  Physical Activity:   . Days of Exercise per Week: Not on file  . Minutes of Exercise per Session: Not on file  Stress:   . Feeling of Stress : Not on file  Social Connections:   . Frequency of Communication with Friends and Family: Not on file  . Frequency of Social Gatherings with Friends and Family: Not on file  . Attends Religious Services: Not on file  . Active Member of Clubs or Organizations: Not on  file  . Attends Archivist Meetings: Not on file  . Marital Status: Not on file  Intimate Partner Violence:   . Fear of Current or Ex-Partner: Not on file  . Emotionally Abused: Not on file  . Physically Abused: Not on file  . Sexually Abused: Not on file   Family History  Problem Relation Age of Onset  . Cancer Mother        Small cell lung cancer  . Diabetes Father        DM  . CAD Father   . Hypertension Father   . Heart disease Father   . Heart attack Father   . Hypertension Sister    ROS: All systems reviewed and negative except as per HPI.   BP 102/63   Pulse 70   Wt 84.4 kg (186 lb 2 oz)   SpO2 98%   BMI 25.24 kg/m  General: NAD Neck: JVP 10 cm, no thyromegaly or thyroid nodule.  Lungs: Clear to auscultation bilaterally with normal respiratory effort. CV: Nondisplaced PMI.  Heart regular S1/S2, no S3/S4, 2/6 SEM RUSB with clear S2.  1+ edema 3/4 to knees bilaterally.  No carotid bruit.  Normal pedal pulses.  Abdomen: Soft, nontender, no hepatosplenomegaly, no distention.  Skin: Intact without lesions or rashes.  Neurologic: Alert and oriented x 3.  Psych: Normal affect. Extremities: No clubbing or cyanosis.  HEENT: Normal.   Assessment/Plan: 1. Chronic systolic CHF: Echo from 123456 was reviewed, showed EF around 35-40% with severe LVH with appearance concerning for cardiac amyloidosis, moderate RV dysfunction, low gradient moderate AS.  He has renal dysfunction and symptoms consistent with peripheral neuropathy.  He has also had cardiac conduction abnormalities.  Presentation is concerning for cardiac amyloidosis, with slow progressive more suggestive of ATTR amyloidosis.  Myeloma panel and urine immunofixation negative so AL amyloidosis unlikely. The Kappa/Lambda light chain ratio was mildly elevated, but reviewed this with Dr Irene Limbo and suspect slightly abnormal serum K/L free light ratios likely from his renal insufficiency with differential light chain  excretion. PYP scan was reviewed and markedly positive, suggesting ATTR amyloidosis. Negative ATTR gene testing suggests wild type disease ATTR amyloidosis. He has history of CAD but no recent chest pain, cannot fully rule out ischemic cardiomyopathy but unlikely.  NYHA class III symptoms still with volume overload on exam though weight is down on increased torsemide.  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  Will send final AL amyloidosis screening test, 24 hour urine immunofixation, though other studies are NOT suggestive of AL amyloidosis.  - Check BNP and Hs-troponin for prognostic information.  - Given ongoing volume overload, increase torsemide to 40 mg bid.  BMET today and again in 10 days.   - Wear graded compression stockings => will give prescription.  - Continue Toprol  XL 25 mg daily.  - Add spironolactone 12.5 mg daily, BP too soft right now for Entresto.   - I will schedule Lexiscan Cardiolite to rule out ischemia given fall in EF though think CAD progression unlikely, avoid coronary angiography for now with low suspicion and elevated creatinine.  - I will arrange for RHC to assess filling pressures and cardiac output.  If Lexiscan Cardiolite is abnormal, will plan for left and right heart cath.  2. CAD: H/o LAD atherectomy in 1999.  No chest pain. I think progressive CAD is unlikely as cause of worsening LV function.  - He is on Eliquis so no ASA.  - Continue statin.  - Lexiscan Cardiolite as above.  3. Atrial fibrillation: Paroxysmal. NSR today.  - Continue apixaban, using 2.5 mg bid dosing with creatinine > 1.5 and age > 25.  4. CKD: Stage 3.  BMET today.  5. Aortic stenosis: Low gradient moderate AS on 12/20 echo.  6. Peripheral neuropathy: Possibly amyloid-related.  He has diet controlled diabetes so unlikely diabetic neuropathy.  7. Complete heart block: St Jude CRT-P system.   Followup 1 month  Loralie Champagne 06/28/2019

## 2019-06-28 NOTE — Patient Instructions (Signed)
INCREASE Torsemide to 40mg  twice daily  START Spironolactone 12,5mg  daily  Routine lab work today. Will notify you of abnormal results   Repeat labs in 10 days  Your provider requests you have a 24 urine collection.  Your provider requests you have a lexiscan. (they will contact you to schedule appointment)  Please wear compression stockings.  Follow up in 1 month.

## 2019-06-29 ENCOUNTER — Ambulatory Visit: Payer: Medicare HMO | Attending: Internal Medicine

## 2019-06-29 ENCOUNTER — Other Ambulatory Visit: Payer: Self-pay

## 2019-06-29 DIAGNOSIS — Z23 Encounter for immunization: Secondary | ICD-10-CM | POA: Insufficient documentation

## 2019-06-29 NOTE — Progress Notes (Signed)
   Covid-19 Vaccination Clinic  Name:  David [name]    MRN: [id number] DOB: 06/17/1940  06/29/2019  [name] was observed post Covid-19 immunization for 30 minutes based on pre-vaccination screening without incidence. He was provided with Vaccine Information Sheet and instruction to access the V-Safe system.   [name] was instructed to call 911 with any severe reactions post vaccine: [name] Difficulty breathing  . Swelling of your face and throat  . A fast heartbeat  . A bad rash all over your body  . Dizziness and weakness    Immunizations Administered    Name Date Dose VIS Date Route   Pfizer COVID-19 Vaccine 06/29/2019 11:36 AM 0.3 mL 05/07/2019 Intramuscular   Manufacturer: [place]   Lot: [id number]   [place]: [id number]

## 2019-06-30 ENCOUNTER — Telehealth (HOSPITAL_COMMUNITY): Payer: Self-pay

## 2019-06-30 NOTE — Telephone Encounter (Signed)
Encounter complete. 

## 2019-07-01 ENCOUNTER — Other Ambulatory Visit (HOSPITAL_COMMUNITY): Payer: Self-pay | Admitting: *Deleted

## 2019-07-01 ENCOUNTER — Telehealth (HOSPITAL_COMMUNITY): Payer: Self-pay | Admitting: Pharmacy Technician

## 2019-07-01 ENCOUNTER — Other Ambulatory Visit: Payer: Self-pay

## 2019-07-01 ENCOUNTER — Ambulatory Visit (HOSPITAL_COMMUNITY)
Admission: RE | Admit: 2019-07-01 | Discharge: 2019-07-01 | Disposition: A | Discharge status: Home / Self Care | Payer: Medicare HMO | Source: Ambulatory Visit | Attending: Cardiology | Admitting: Cardiology

## 2019-07-01 DIAGNOSIS — Z955 Presence of coronary angioplasty implant and graft: Secondary | ICD-10-CM | POA: Diagnosis not present

## 2019-07-01 DIAGNOSIS — Z8249 Family history of ischemic heart disease and other diseases of the circulatory system: Secondary | ICD-10-CM | POA: Diagnosis not present

## 2019-07-01 DIAGNOSIS — I2510 Atherosclerotic heart disease of native coronary artery without angina pectoris: Secondary | ICD-10-CM | POA: Insufficient documentation

## 2019-07-01 DIAGNOSIS — Z87891 Personal history of nicotine dependence: Secondary | ICD-10-CM | POA: Insufficient documentation

## 2019-07-01 DIAGNOSIS — R9439 Abnormal result of other cardiovascular function study: Secondary | ICD-10-CM | POA: Diagnosis not present

## 2019-07-01 DIAGNOSIS — I509 Heart failure, unspecified: Secondary | ICD-10-CM

## 2019-07-01 DIAGNOSIS — N183 Chronic kidney disease, stage 3 unspecified: Secondary | ICD-10-CM | POA: Diagnosis not present

## 2019-07-01 DIAGNOSIS — I4891 Unspecified atrial fibrillation: Secondary | ICD-10-CM | POA: Diagnosis not present

## 2019-07-01 DIAGNOSIS — R0609 Other forms of dyspnea: Secondary | ICD-10-CM | POA: Insufficient documentation

## 2019-07-01 DIAGNOSIS — R0602 Shortness of breath: Secondary | ICD-10-CM | POA: Insufficient documentation

## 2019-07-01 DIAGNOSIS — I130 Hypertensive heart and chronic kidney disease with heart failure and stage 1 through stage 4 chronic kidney disease, or unspecified chronic kidney disease: Secondary | ICD-10-CM | POA: Insufficient documentation

## 2019-07-01 NOTE — Telephone Encounter (Signed)
Requested [employer] send over a letter of financial hardship for patient to fill in for patient assistance.   Called patient who would for [place] to send him the document as a PDF once received.  Will follow up.  Charlann Boxer, CPhT

## 2019-07-02 ENCOUNTER — Ambulatory Visit (HOSPITAL_BASED_OUTPATIENT_CLINIC_OR_DEPARTMENT_OTHER)
Admission: RE | Admit: 2019-07-02 | Discharge: 2019-07-02 | Disposition: A | Discharge status: Home / Self Care | Payer: Medicare HMO | Source: Ambulatory Visit | Attending: Cardiovascular Disease | Admitting: Cardiovascular Disease

## 2019-07-02 DIAGNOSIS — I130 Hypertensive heart and chronic kidney disease with heart failure and stage 1 through stage 4 chronic kidney disease, or unspecified chronic kidney disease: Secondary | ICD-10-CM | POA: Diagnosis not present

## 2019-07-02 DIAGNOSIS — I509 Heart failure, unspecified: Secondary | ICD-10-CM

## 2019-07-02 LAB — MYOCARDIAL PERFUSION IMAGING
LV dias vol: 159 mL (ref 62–150)
LV sys vol: 95 mL
Peak HR: 76 {beats}/min
Rest HR: 70 {beats}/min
SDS: 5
SRS: 0
SSS: 5

## 2019-07-02 LAB — IMMUNOFIXATION, URINE

## 2019-07-02 MED ORDER — TECHNETIUM TC 99M TETROFOSMIN IV KIT
10.6000 | PACK | Freq: Once | INTRAVENOUS | Status: AC | PRN
  Administered 2019-07-02: 10.6 via INTRAVENOUS
  Filled 2019-07-02: qty 11

## 2019-07-02 MED ORDER — AMINOPHYLLINE 25 MG/ML IV SOLN
75.0000 mg | Freq: Once | INTRAVENOUS | Status: AC
  Administered 2019-07-02: 75 mg via INTRAVENOUS

## 2019-07-02 MED ORDER — REGADENOSON 0.4 MG/5ML IV SOLN
0.4000 mg | Freq: Once | INTRAVENOUS | Status: AC
  Administered 2019-07-02: 0.4 mg via INTRAVENOUS

## 2019-07-02 MED ORDER — TECHNETIUM TC 99M TETROFOSMIN IV KIT
32.6000 | PACK | Freq: Once | INTRAVENOUS | Status: AC | PRN
  Administered 2019-07-02: 32.6 via INTRAVENOUS
  Filled 2019-07-02: qty 33

## 2019-07-06 NOTE — Telephone Encounter (Signed)
Have not received letter from [facility]. Called and they are supposed to refax and email the letter over.  Will follow up.  Charlann Boxer, CPhT

## 2019-07-07 ENCOUNTER — Ambulatory Visit (HOSPITAL_COMMUNITY)
Admission: RE | Admit: 2019-07-07 | Discharge: 2019-07-07 | Disposition: A | Discharge status: Home / Self Care | Payer: Medicare HMO | Source: Ambulatory Visit | Attending: Internal Medicine | Admitting: Internal Medicine

## 2019-07-07 ENCOUNTER — Other Ambulatory Visit: Payer: Self-pay

## 2019-07-07 ENCOUNTER — Telehealth (HOSPITAL_COMMUNITY): Payer: Self-pay

## 2019-07-07 DIAGNOSIS — I5032 Chronic diastolic (congestive) heart failure: Secondary | ICD-10-CM | POA: Diagnosis present

## 2019-07-07 DIAGNOSIS — I509 Heart failure, unspecified: Secondary | ICD-10-CM

## 2019-07-07 LAB — BASIC METABOLIC PANEL
Anion gap: 13 (ref 5–15)
BUN: 45 mg/dL — ABNORMAL HIGH (ref 8–23)
CO2: 25 mmol/L (ref 22–32)
Calcium: 9.4 mg/dL (ref 8.9–10.3)
Chloride: 98 mmol/L (ref 98–111)
Creatinine, Ser: 1.82 mg/dL — ABNORMAL HIGH (ref 0.61–1.24)
GFR calc Af Amer: 39 mL/min — ABNORMAL LOW (ref >60)
GFR calc non Af Amer: 34 mL/min — ABNORMAL LOW (ref >60)
Glucose, Bld: 98 mg/dL (ref 70–99)
Potassium: 4.2 mmol/L (ref 3.5–5.1)
Sodium: 136 mmol/L (ref 135–145)

## 2019-07-07 MED ORDER — TORSEMIDE 20 MG PO TABS: ORAL_TABLET | ORAL | 5 refills | Status: DC

## 2019-07-07 NOTE — Addendum Note (Signed)
Encounter addended by: Kerry Dory, CMA on: 07/07/2019 11:35 AM  Actions taken: Visit diagnoses modified, Order list changed, Diagnosis association updated

## 2019-07-07 NOTE — Telephone Encounter (Signed)
-----   Message from Larey Dresser, MD sent at 07/07/2019  2:36 PM EST ----- Creatinine is a little higher, decrease torsemide to 40 qam/20 qpm for now.

## 2019-07-09 LAB — PROTEIN ELECTRO, RANDOM URINE
Albumin ELP, Urine: 100 %
Alpha-1-Globulin, U: 0 %
Alpha-2-Globulin, U: 0 %
Beta Globulin, U: 0 %
Gamma Globulin, U: 0 %
Total Protein, Urine: 12.1 mg/dL

## 2019-07-09 NOTE — Telephone Encounter (Signed)
Received appeal and sent to patient. Called and left voicemail to see if he needed any help in filling it out.  Will follow up.  Charlann Boxer, CPhT

## 2019-07-12 ENCOUNTER — Encounter (HOSPITAL_COMMUNITY): Payer: Self-pay

## 2019-07-12 ENCOUNTER — Telehealth (HOSPITAL_COMMUNITY): Payer: Self-pay | Admitting: Pharmacist

## 2019-07-12 MED ORDER — VYNDAMAX 61 MG PO CAPS: 61.0000 mg | ORAL_CAPSULE | Freq: Every day | ORAL | 11 refills | Status: DC

## 2019-07-12 MED FILL — VYNDAMAX 61 MG CAPS: 61 | 30 days supply | Qty: 30 | Fill #0

## 2019-07-12 NOTE — Telephone Encounter (Signed)
Sent prescription for Vyndamax 61 mg daily to [facility].  Audry Riles, PharmD, BCPS, BCCP, CPP Heart Failure Clinic Pharmacist [phone]

## 2019-07-12 NOTE — Telephone Encounter (Signed)
Patient spoke with insurance and found out Vyndamax would be more affordable now than before. I was able to check and his co-pay of Vyndamax at this time would be $162.94. We do believe that the cost will drop some after his deductible has been met.   Set patient up with [facility] who will mail [facility] Tuesday so he can start by Thursday. Patient is aware that the specialty pharmacy will call him once a month to set up delivery of his medication.   Advised patient to call with any issues.   Charlann Boxer, CPhT

## 2019-07-12 NOTE — Telephone Encounter (Signed)
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 However, copay through Medicare is $67.00/month. Per patient, this is an acceptable copay. Will start medication today. Will obtain through [facility].   Audry Riles, PharmD, BCPS, BCCP, CPP Heart Failure Clinic Pharmacist [phone]

## 2019-07-13 ENCOUNTER — Telehealth (HOSPITAL_COMMUNITY): Payer: Self-pay

## 2019-07-13 DIAGNOSIS — I5032 Chronic diastolic (congestive) heart failure: Secondary | ICD-10-CM

## 2019-07-13 NOTE — Telephone Encounter (Signed)
-----   Message from Larey Dresser, MD sent at 07/12/2019  1:55 PM EST ----- Please obtain BMET this week.

## 2019-07-13 NOTE — Telephone Encounter (Signed)
Pt scheduled for lab appt on 2/18. Pt amenable to date and time.

## 2019-07-15 ENCOUNTER — Other Ambulatory Visit (HOSPITAL_COMMUNITY): Payer: Medicare HMO

## 2019-07-19 ENCOUNTER — Other Ambulatory Visit: Payer: Self-pay

## 2019-07-19 ENCOUNTER — Ambulatory Visit (HOSPITAL_COMMUNITY)
Admission: RE | Admit: 2019-07-19 | Discharge: 2019-07-19 | Disposition: A | Discharge status: Home / Self Care | Payer: Medicare HMO | Source: Ambulatory Visit | Attending: Cardiology | Admitting: Cardiology

## 2019-07-19 DIAGNOSIS — I5032 Chronic diastolic (congestive) heart failure: Secondary | ICD-10-CM | POA: Diagnosis not present

## 2019-07-19 LAB — BASIC METABOLIC PANEL
Anion gap: 13 (ref 5–15)
BUN: 44 mg/dL — ABNORMAL HIGH (ref 8–23)
CO2: 27 mmol/L (ref 22–32)
Calcium: 9.4 mg/dL (ref 8.9–10.3)
Chloride: 97 mmol/L — ABNORMAL LOW (ref 98–111)
Creatinine, Ser: 1.89 mg/dL — ABNORMAL HIGH (ref 0.61–1.24)
GFR calc Af Amer: 38 mL/min — ABNORMAL LOW (ref >60)
GFR calc non Af Amer: 33 mL/min — ABNORMAL LOW (ref >60)
Glucose, Bld: 101 mg/dL — ABNORMAL HIGH (ref 70–99)
Potassium: 4.1 mmol/L (ref 3.5–5.1)
Sodium: 137 mmol/L (ref 135–145)

## 2019-07-27 ENCOUNTER — Telehealth (HOSPITAL_COMMUNITY): Payer: Self-pay | Admitting: Cardiology

## 2019-07-27 NOTE — Telephone Encounter (Signed)
Received vm with questions regarding otc meds  lmom

## 2019-07-28 ENCOUNTER — Encounter (HOSPITAL_COMMUNITY): Payer: Self-pay | Admitting: Cardiology

## 2019-07-28 ENCOUNTER — Other Ambulatory Visit: Payer: Self-pay

## 2019-07-28 ENCOUNTER — Ambulatory Visit (HOSPITAL_COMMUNITY)
Admission: RE | Admit: 2019-07-28 | Discharge: 2019-07-28 | Disposition: A | Discharge status: Home / Self Care | Payer: Medicare HMO | Source: Ambulatory Visit | Attending: Cardiology | Admitting: Cardiology

## 2019-07-28 VITALS — BP 110/72 | HR 70 | Wt 170.0 lb

## 2019-07-28 DIAGNOSIS — I350 Nonrheumatic aortic (valve) stenosis: Secondary | ICD-10-CM | POA: Insufficient documentation

## 2019-07-28 DIAGNOSIS — G4733 Obstructive sleep apnea (adult) (pediatric): Secondary | ICD-10-CM | POA: Insufficient documentation

## 2019-07-28 DIAGNOSIS — E1142 Type 2 diabetes mellitus with diabetic polyneuropathy: Secondary | ICD-10-CM | POA: Diagnosis not present

## 2019-07-28 DIAGNOSIS — Z833 Family history of diabetes mellitus: Secondary | ICD-10-CM | POA: Insufficient documentation

## 2019-07-28 DIAGNOSIS — Z8249 Family history of ischemic heart disease and other diseases of the circulatory system: Secondary | ICD-10-CM | POA: Insufficient documentation

## 2019-07-28 DIAGNOSIS — I480 Paroxysmal atrial fibrillation: Secondary | ICD-10-CM | POA: Insufficient documentation

## 2019-07-28 DIAGNOSIS — I130 Hypertensive heart and chronic kidney disease with heart failure and stage 1 through stage 4 chronic kidney disease, or unspecified chronic kidney disease: Secondary | ICD-10-CM | POA: Diagnosis not present

## 2019-07-28 DIAGNOSIS — Z87891 Personal history of nicotine dependence: Secondary | ICD-10-CM | POA: Diagnosis not present

## 2019-07-28 DIAGNOSIS — Z7901 Long term (current) use of anticoagulants: Secondary | ICD-10-CM | POA: Diagnosis not present

## 2019-07-28 DIAGNOSIS — I43 Cardiomyopathy in diseases classified elsewhere: Secondary | ICD-10-CM

## 2019-07-28 DIAGNOSIS — I2510 Atherosclerotic heart disease of native coronary artery without angina pectoris: Secondary | ICD-10-CM | POA: Insufficient documentation

## 2019-07-28 DIAGNOSIS — N183 Chronic kidney disease, stage 3 unspecified: Secondary | ICD-10-CM | POA: Diagnosis not present

## 2019-07-28 DIAGNOSIS — I442 Atrioventricular block, complete: Secondary | ICD-10-CM | POA: Diagnosis not present

## 2019-07-28 DIAGNOSIS — E1122 Type 2 diabetes mellitus with diabetic chronic kidney disease: Secondary | ICD-10-CM | POA: Diagnosis not present

## 2019-07-28 DIAGNOSIS — Z950 Presence of cardiac pacemaker: Secondary | ICD-10-CM | POA: Insufficient documentation

## 2019-07-28 DIAGNOSIS — I5022 Chronic systolic (congestive) heart failure: Secondary | ICD-10-CM | POA: Insufficient documentation

## 2019-07-28 DIAGNOSIS — I5032 Chronic diastolic (congestive) heart failure: Secondary | ICD-10-CM | POA: Diagnosis not present

## 2019-07-28 DIAGNOSIS — E854 Organ-limited amyloidosis: Secondary | ICD-10-CM

## 2019-07-28 DIAGNOSIS — Z801 Family history of malignant neoplasm of trachea, bronchus and lung: Secondary | ICD-10-CM | POA: Insufficient documentation

## 2019-07-28 LAB — BASIC METABOLIC PANEL
Anion gap: 12 (ref 5–15)
BUN: 53 mg/dL — ABNORMAL HIGH (ref 8–23)
CO2: 24 mmol/L (ref 22–32)
Calcium: 9.6 mg/dL (ref 8.9–10.3)
Chloride: 99 mmol/L (ref 98–111)
Creatinine, Ser: 1.81 mg/dL — ABNORMAL HIGH (ref 0.61–1.24)
GFR calc Af Amer: 40 mL/min — ABNORMAL LOW (ref >60)
GFR calc non Af Amer: 34 mL/min — ABNORMAL LOW (ref >60)
Glucose, Bld: 113 mg/dL — ABNORMAL HIGH (ref 70–99)
Potassium: 4 mmol/L (ref 3.5–5.1)
Sodium: 135 mmol/L (ref 135–145)

## 2019-07-28 LAB — VITAMIN D 25 HYDROXY (VIT D DEFICIENCY, FRACTURES): Vit D, 25-Hydroxy: 38.07 ng/mL (ref 30–100)

## 2019-07-28 MED ORDER — SPIRONOLACTONE 25 MG PO TABS: 25.0000 mg | ORAL_TABLET | Freq: Every day | ORAL | 3 refills | Status: DC

## 2019-07-28 NOTE — Patient Instructions (Signed)
START taking Toprol XL 25mg  (1 tab) daily   INCREASE Spironolactone to 25mg  (1 tab) daily    Labs today We will only contact you if something comes back abnormal or we need to make some changes. Otherwise no news is good news!   You have been referred to Cardiac Rehab.  They will call you to schedule an appointment.    Your physician recommends that you schedule a follow-up appointment in: 6 weeks with Dr Aundra Dubin    Please call office at [phone] option [phone] if you have any questions or concerns.    At the [facility], you and your health needs are our priority. As part of our continuing mission to provide you with exceptional heart care, we have created designated Provider Care Teams. These Care Teams include your primary Cardiologist (physician) and Advanced Practice Providers (APPs- Physician Assistants and Nurse Practitioners) who all work together to provide you with the care you need, when you need it.   You may see any of the following providers on your designated Care Team at your next follow up: [name] Dr Glori Bickers . Dr Loralie Champagne . Darrick Grinder, NP . Lyda Jester, PA . Audry Riles, PharmD   Please be sure to bring in all your medications bottles to every appointment.

## 2019-07-29 ENCOUNTER — Ambulatory Visit (INDEPENDENT_AMBULATORY_CARE_PROVIDER_SITE_OTHER): Payer: Medicare HMO | Admitting: *Deleted

## 2019-07-29 DIAGNOSIS — I5022 Chronic systolic (congestive) heart failure: Secondary | ICD-10-CM | POA: Diagnosis not present

## 2019-07-29 NOTE — Progress Notes (Signed)
PCP: Lajean Manes, MD Cardiology: Dr. Irish Lack HF Cardiology: Dr. Aundra Dubin  79 y.o. with history of complete heart block, CKD stage 3, paroxysmal atrial fibrillation, CAD, and chronic systolic CHF was referred by Dr. Irish Lack for evaluation of possible cardiac amyloidosis.  In 1999, he had LAD atherectomy.  Patient developed complete heart block in 2012 with PPM placement.  He had a stress PET done at that time that was not suggestive of ischemia.   Paroxysmal atrial fibrillation was noted during device interrogation and apixaban was started.  Echo in 12/19 was noted to show EF 45-50% with severe LVH.    Echo was done again in 12/20, showing EF 35-40%, severe LVH with concern for amyloidosis, moderate RV dysfunction, low gradient moderate AS, IVC dilated.  Given concern for cardiac amyloidosis, urine immunofixation was done and was negative, and serum myeloma panel was done and was negative.  PYP scan was done and was suggestive of cardiac amyloidosis.  TTR amyloidosis genetic testing was negative, suggesting wild type disease.   Cardiolite in 2/21 showed EF 40%, no ischemia or infarction.   He returns for followup of CHF.  Weight is now down 16 lbs.  Breathing is overall better.  He is short of breath walking up hills but not short of breath walking on flat ground.  He walks around a large block on most days.  Uses exercise bike for 15-20 minutes at a time.  No lightheadedness.  No chest pain.     Labs (11/20): K 4.7, creatinine 1.12 Labs (12/20): Urine immunofixation and serum myeloma panel negative.  Labs (1/21): K 3.6, creatinine 1.65, mildly increased Kappa/[place] free light chain ratio Labs (2/21): K 4.1, creatinine 1.89, LDL 63, HDL 52  PMH: 1. Complete heart block: PPM placed 11/12.  2. CKD: Stage 3.  3. Type 2 diabetes  4. OSA 5. H/o spinal stenosis 6. HTN 7. Atrial fibrillation: Paroxysmal.  8. Gibert's disease 9. Aortic stenosis: Echo in 12/20 with low gradient moderate AS. .   10. CAD: LAD atherectomy in 1999.  - Cardiolite (2/21): EF 40%, no ischemia or infarction.  11. Chronic systolic CHF: St Jude CRT-P device upgrade.  - Echo (12/20): EF 35-40%, severe LVH, looks like amyloidosis, moderately decreased RV systolic function, mild MR, low gradient moderate AS (10 mmHg, 1.1 cm^2), IVC dilated.  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 Urine immunofixation and serum myeloma panel negative.  Gene testing for TTR was negative suggesting wild type disease.   Social History   Socioeconomic History  . Marital status: Married    Spouse name: Not on file  . Number of children: Not on file  . Years of education: Not on file  . Highest education level: Not on file  Occupational History  . Not on file  Tobacco Use  . Smoking status: Former Research scientist (life sciences)  . Smokeless tobacco: Never Used  Substance and Sexual Activity  . Alcohol use: Yes    Comment: occasional glass of wine  . Drug use: No  . Sexual activity: Not on file  Other Topics Concern  . Not on file  Social History Narrative  . Not on file   Social Determinants of Health   Financial Resource Strain:   . Difficulty of Paying Living Expenses: Not on file  Food Insecurity:   . Worried About Charity fundraiser in the Last Year: Not on file  . Ran Out of Food  in the Last Year: Not on file  Transportation Needs:   . Lack of Transportation (Medical): Not on file  . Lack of Transportation (Non-Medical): Not on file  Physical Activity:   . Days of Exercise per Week: Not on file  . Minutes of Exercise per Session: Not on file  Stress:   . Feeling of Stress : Not on file  Social Connections:   . Frequency of Communication with Friends and Family: Not on file  . Frequency of Social Gatherings with Friends and Family: Not on file  . Attends Religious Services: Not on file  . Active Member of Clubs or Organizations: Not on file  .  Attends Archivist Meetings: Not on file  . Marital Status: Not on file  Intimate Partner Violence:   . Fear of Current or Ex-Partner: Not on file  . Emotionally Abused: Not on file  . Physically Abused: Not on file  . Sexually Abused: Not on file   Family History  Problem Relation Age of Onset  . Cancer Mother        Small cell lung cancer  . Diabetes Father        DM  . CAD Father   . Hypertension Father   . Heart disease Father   . Heart attack Father   . Hypertension Sister    ROS: All systems reviewed and negative except as per HPI.   BP 110/72   Pulse 70   Wt 77.1 kg (170 lb)   SpO2 100%   BMI 23.06 kg/m  General: NAD Neck: No JVD, no thyromegaly or thyroid nodule.  Lungs: Clear to auscultation bilaterally with normal respiratory effort. CV: Nondisplaced PMI.  Heart regular S1/S2, no S3/S4, no murmur.  No peripheral edema.  No carotid bruit.  Normal pedal pulses.  Abdomen: Soft, nontender, no hepatosplenomegaly, no distention.  Skin: Intact without lesions or rashes.  Neurologic: Alert and oriented x 3.  Psych: Normal affect. Extremities: No clubbing or cyanosis.  HEENT: Normal.   Assessment/Plan: 1. Chronic systolic CHF: Echo from [id number] was reviewed, showed EF around 35-40% with severe LVH with appearance concerning for cardiac amyloidosis, moderate RV dysfunction, low gradient moderate AS.  He has renal dysfunction and symptoms consistent with peripheral neuropathy.  He has also had cardiac conduction abnormalities.  Presentation is concerning for cardiac amyloidosis, with slow progressive more suggestive of ATTR amyloidosis.  Myeloma panel and urine immunofixation negative so AL amyloidosis unlikely. The Kappa/Lambda light chain ratio was mildly elevated, but reviewed this with Dr Irene Limbo and suspect slightly abnormal serum K/L free light ratios likely from his renal insufficiency with differential light chain excretion.  PYP scan was reviewed and markedly  positive, suggesting ATTR amyloidosis. Negative ATTR gene testing suggests wild type disease ATTR amyloidosis. He has history of CAD but no recent chest pain, Cardiolite in 2/21 was negative.  NYHA class II symptoms now, improved with weight down significantly.   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  - Decrease torsemide to 40 mg qam, and 20 mg every other afternoon.     - Continue Toprol XL 25 mg daily.  - Increase spironolactone to 25 mg daily.  BMET today and in 10 days.  - Hold off on Entresto for now with soft BP and creatinine rise.   2. CAD: H/o LAD atherectomy in 1999.  No chest pain. I think progressive CAD is unlikely as cause of worsening LV function, Cardiolite in 2/21 was  normal.  - He is on Eliquis so no ASA.  - Continue statin.  3. Atrial fibrillation: Paroxysmal. NSR today.  - Continue apixaban, using 2.5 mg bid dosing with creatinine > 1.5 and age > 67.  4. CKD: Stage 3.  BMET today.  5. Aortic stenosis: Low gradient moderate AS on 12/20 echo.  6. Peripheral neuropathy: Possibly amyloid-related.  He has diet controlled diabetes so unlikely diabetic neuropathy.  7. Complete heart block: St Jude CRT-P system.   Loralie Champagne 07/29/2019

## 2019-07-30 LAB — CUP PACEART REMOTE DEVICE CHECK
Battery Remaining Longevity: 32 mo
Battery Remaining Percentage: 94 %
Battery Voltage: 2.96 V
Brady Statistic AP VP Percent: 97 %
Brady Statistic AP VS Percent: 1 %
Brady Statistic AS VP Percent: 3.2 %
Brady Statistic AS VS Percent: 1 %
Brady Statistic RA Percent Paced: 97 %
Date Time Interrogation Session: 20210304020014
Implantable Lead Implant Date: 20121031
Implantable Lead Implant Date: 20121031
Implantable Lead Implant Date: 20201209
Implantable Lead Implant Date: 20201209
Implantable Lead Location: 753858
Implantable Lead Location: 753859
Implantable Lead Location: 753860
Implantable Lead Location: 753862
Implantable Lead Model: 3830
Implantable Pulse Generator Implant Date: 20201209
Lead Channel Impedance Value: 310 Ohm
Lead Channel Impedance Value: 380 Ohm
Lead Channel Impedance Value: 430 Ohm
Lead Channel Pacing Threshold Amplitude: 0.5 V
Lead Channel Pacing Threshold Amplitude: 0.75 V
Lead Channel Pacing Threshold Amplitude: 1 V
Lead Channel Pacing Threshold Pulse Width: 0.5 ms
Lead Channel Pacing Threshold Pulse Width: 0.8 ms
Lead Channel Pacing Threshold Pulse Width: 1 ms
Lead Channel Sensing Intrinsic Amplitude: 2.5 mV
Lead Channel Setting Pacing Amplitude: 2 V
Lead Channel Setting Pacing Amplitude: 2.5 V
Lead Channel Setting Pacing Amplitude: 3.5 V
Lead Channel Setting Pacing Pulse Width: 0.8 ms
Lead Channel Setting Pacing Pulse Width: 1 ms
Lead Channel Setting Sensing Sensitivity: 4 mV
Pulse Gen Model: 3222
Pulse Gen Serial Number: 9138824

## 2019-08-06 ENCOUNTER — Other Ambulatory Visit (HOSPITAL_COMMUNITY): Payer: Self-pay | Admitting: Cardiology

## 2019-08-06 MED ORDER — VYNDAMAX 61 MG PO CAPS: 61.0000 mg | ORAL_CAPSULE | Freq: Every day | ORAL | 3 refills | Status: DC

## 2019-08-06 NOTE — Telephone Encounter (Signed)
Pt reports medications addressed at [facility] 07/28/19

## 2019-08-06 NOTE — Telephone Encounter (Signed)
Pt reports he will need a new script for vyndamax sent to [facility] Reports the co pay will be $100 cheaper at [facility] As requested meds sent

## 2019-08-10 ENCOUNTER — Ambulatory Visit: Payer: Medicare HMO | Admitting: Internal Medicine

## 2019-08-10 ENCOUNTER — Encounter: Payer: Self-pay | Admitting: Internal Medicine

## 2019-08-10 ENCOUNTER — Other Ambulatory Visit: Payer: Self-pay

## 2019-08-10 VITALS — BP 98/58 | HR 71 | Ht 72.0 in | Wt 167.0 lb

## 2019-08-10 DIAGNOSIS — Z950 Presence of cardiac pacemaker: Secondary | ICD-10-CM

## 2019-08-10 DIAGNOSIS — I5032 Chronic diastolic (congestive) heart failure: Secondary | ICD-10-CM | POA: Diagnosis not present

## 2019-08-10 DIAGNOSIS — I480 Paroxysmal atrial fibrillation: Secondary | ICD-10-CM

## 2019-08-10 DIAGNOSIS — I442 Atrioventricular block, complete: Secondary | ICD-10-CM

## 2019-08-10 LAB — CUP PACEART INCLINIC DEVICE CHECK
Date Time Interrogation Session: 20210316135447
Implantable Lead Implant Date: 20121031
Implantable Lead Implant Date: 20121031
Implantable Lead Implant Date: 20201209
Implantable Lead Implant Date: 20201209
Implantable Lead Location: 753858
Implantable Lead Location: 753859
Implantable Lead Location: 753860
Implantable Lead Location: 753862
Implantable Lead Model: 3830
Implantable Pulse Generator Implant Date: 20201209
Pulse Gen Model: 3222
Pulse Gen Serial Number: 9138824

## 2019-08-10 MED ORDER — TORSEMIDE 20 MG PO TABS: ORAL_TABLET | ORAL | 3 refills | Status: DC

## 2019-08-10 NOTE — Patient Instructions (Addendum)
Medication Instructions:  Your physician has recommended you make the following change in your medication:   1.  Reduce your torsemide 20 mg-  Take 2 tablets by mouth daily except on Wednesday.  On Wednesday take one tablet by mouth.  Labwork: None ordered.  Testing/Procedures: None ordered.  Follow-Up: Your physician wants you to follow-up in: one year with Dr. Lovena Le.   You will receive a reminder letter in the mail two months in advance. If you don't receive a letter, please call our office to schedule the follow-up appointment.  Remote monitoring is used to monitor your Pacemaker from home.   The device clinic will reschedule your remotes for every 91 days.  Any Other Special Instructions Will Be Listed Below (If Applicable).  If you need a refill on your cardiac medications before your next appointment, please call your pharmacy.

## 2019-08-10 NOTE — Progress Notes (Signed)
HPI David [name] returns today for followup. He is a pleasant 79 yo man with a h/o CHB and CAD, s/p PPM. He developed worsening LV dysfunction and underwent upgrade to a biv device about 3 months ago. He had a very tortuous venous anatomy and a valve precluding the placement of an LV lead. However a LB area pacing lead was successfully placed. In the interim, he feels well with a nice diuresis over the past 3 months. He was on 60 of torsemide and this is down to 40 and he has lost over 15 lbs of fluid. His dyspnea is improved. Allergies  Allergen Reactions  . Benadryl [Diphenhydramine Hcl] Other (See Comments)    Adverse reaction, has very strange behavior. Also hyperactive.  . Lisinopril Other (See Comments)    hypotensive  . Restoril [Temazepam] Other (See Comments)    Hyperactive     Current Outpatient Medications  Medication Sig Dispense Refill  . acetaminophen (TYLENOL) 500 MG tablet Take 1,000 mg by mouth every 6 (six) hours as needed for mild pain.     [name] apixaban (ELIQUIS) 2.5 MG TABS tablet Take 1 tablet (2.5 mg total) by mouth 2 (two) times daily. 180 tablet 1  . Cholecalciferol (VITAMIN D) 50 MCG (2000 UT) tablet Take 2,000 Units by mouth daily.    . Coenzyme Q10 (COQ10) 100 MG CAPS Take 100 mg by mouth daily.     . Cyanocobalamin (VITAMIN B-12) 2500 MCG SUBL Place 2,500 mcg under the tongue daily.     . ferrous sulfate 325 (65 FE) MG tablet Take 325 mg by mouth 3 (three) times a week.    . fluticasone (FLONASE) 50 MCG/ACT nasal spray Place 1 spray into both nostrils daily as needed for allergies or rhinitis.    [name] KLOR-CON M20 20 MEQ tablet TAKE 1 TABLET DAILY 90 tablet 3  . LORazepam (ATIVAN) 0.5 MG tablet Take 0.5 mg by mouth at bedtime as needed for sleep.    . magnesium oxide (MAG-OX) 400 MG tablet Take 400 mg by mouth daily.    . metoprolol succinate (TOPROL XL) 25 MG 24 hr tablet Take 1 tablet (25 mg total) by mouth daily. 30 tablet 3  . Multiple Vitamins-Minerals  (PRESERVISION AREDS 2) CAPS Take 1 capsule by mouth 2 (two) times daily.    . mupirocin ointment (BACTROBAN) 2 % Place 1 application into the nose daily as needed (wound care).    . nitroGLYCERIN (NITROSTAT) 0.4 MG SL tablet Place 1 tablet (0.4 mg total) under the tongue every 5 (five) minutes as needed. For chest pain 25 tablet 5  . Omega-3 Fatty Acids (FISH OIL) 1000 MG CAPS Take 1,000 mg by mouth daily.    . Polyvinyl Alcohol-Povidone (REFRESH OP) Place 1 drop into both eyes daily as needed (dry eyes).    . pramipexole (MIRAPEX) 1 MG tablet Take 2 mg by mouth at bedtime.      . simvastatin (ZOCOR) 10 MG tablet Take 10 mg by mouth every evening.     [name] spironolactone (ALDACTONE) 25 MG tablet Take 1 tablet (25 mg total) by mouth daily. 90 tablet 3  . Tafamidis (VYNDAMAX) 61 MG CAPS Take 61 mg by mouth daily. 90 capsule 3  . triamcinolone cream (KENALOG) 0.1 % Apply 1 application topically 2 (two) times daily as needed (itching).    . vitamin C (ASCORBIC ACID) 500 MG tablet Take 500 mg by mouth daily.    [name] torsemide (DEMADEX) 20 MG  tablet Take 2 tablets by mouth daily except Wednesday.  On Wednesday take one tablet by mouth. 180 tablet 3   No current facility-administered medications for this visit.     Past Medical History:  Diagnosis Date  . Aortic stenosis    a.mild-mod by echo 06/2017.  . Ascending aorta dilatation (HCC)    a. mildly dilated aortic root and mod dilated ascending aorta by echo 06/2017.  [name] Chronic diastolic CHF (congestive heart failure) ([place])   . CKD (chronic kidney disease), stage III   . Complete heart block (HCC)    a. s/p STJ pacemaker in 02/2011 - Dr. [name]  . Coronary artery disease    a. LAD atherectomy 1999.  . Gilbert's syndrome   . Hypertension   . Insomnia, unspecified   . Pacemaker-St Judes 07/16/2011  . PAF (paroxysmal atrial fibrillation) ([place])    a. identified on pacemaker interrogation; on Eliquis.  . Peripheral neuropathy   . Pure  hypercholesterolemia   . Restless leg syndrome   . Spermatocele    near left testicle 2 cm  . Spinal stenosis, lumbar   . Thrombocytopenia ([place])    Averaging 140,000 since 2008  . Tubular adenoma 09/2012   Dr. Watt Climes repeat 4 years  . Type II or unspecified type diabetes mellitus without mention of complication, not stated as uncontrolled   . Unspecified sleep apnea     ROS:   All systems reviewed and negative except as noted in the HPI.   Past Surgical History:  Procedure Laterality Date  . APPENDECTOMY  1957  . CHOLECYSTECTOMY  1988  . CORONARY STENT PLACEMENT    . HEMORROIDECTOMY  1980  . HERNIA REPAIR Bilateral   . LEAD INSERTION N/A 05/05/2019   Procedure: LEAD INSERTION;  Surgeon: Evans Lance, MD;  Location: [facility];  Service: Cardiovascular;  Laterality: N/A;  . pacermaker  07/16/11   Dr. [name]  . PPM GENERATOR CHANGEOUT N/A 05/05/2019   Procedure: PPM GENERATOR CHANGEOUT;  Surgeon: Evans Lance, MD;  Location: [facility];  Service: Cardiovascular;  Laterality: N/A;  . TEE WITHOUT CARDIOVERSION N/A 06/14/2014   Procedure: TRANSESOPHAGEAL ECHOCARDIOGRAM (TEE);  Surgeon: [name], MD;  Location: [facility] ENDOSCOPY;  Service: Cardiovascular;  Laterality: N/A;     Family History  Problem Relation Age of Onset  . Cancer Mother        Small cell lung cancer  . Diabetes Father        DM  . CAD Father   . Hypertension Father   . Heart disease Father   . Heart attack Father   . Hypertension Sister      Social History   Socioeconomic History  . Marital status: Married    Spouse name: Not on file  . Number of children: Not on file  . Years of education: Not on file  . Highest education level: Not on file  Occupational History  . Not on file  Tobacco Use  . Smoking status: Former Research scientist (life sciences)  . Smokeless tobacco: Never Used  Substance and Sexual Activity  . Alcohol use: Yes    Comment: occasional glass of wine  . Drug use: No  . Sexual  activity: Not on file  Other Topics Concern  . Not on file  Social History Narrative  . Not on file   Social Determinants of Health   Financial Resource Strain:   . Difficulty of Paying Living Expenses:   Food Insecurity:   .  Worried About Charity fundraiser in the Last Year:   . Arboriculturist in the Last Year:   Transportation Needs:   . Film/video editor (Medical):   [name] Lack of Transportation (Non-Medical):   Physical Activity:   . Days of Exercise per Week:   . Minutes of Exercise per Session:   Stress:   . Feeling of Stress :   Social Connections:   . Frequency of Communication with Friends and Family:   . Frequency of Social Gatherings with Friends and Family:   . Attends Religious Services:   . Active Member of Clubs or Organizations:   . Attends Archivist Meetings:   [name] Marital Status:   Intimate Partner Violence:   . Fear of Current or Ex-Partner:   . Emotionally Abused:   [name] Physically Abused:   . Sexually Abused:      BP (!) 98/58   Pulse 71   Ht 6' (1.829 m)   Wt 167 lb (75.8 kg)   SpO2 99%   BMI 22.65 kg/m   Physical Exam:  Well appearing NAD HEENT: Unremarkable Neck:  No JVD, no thyromegally Lymphatics:  No adenopathy Back:  No CVA tenderness Lungs:  Clear HEART:  Regular rate rhythm, no murmurs, no rubs, no clicks Abd:  soft, positive bowel sounds, no organomegally, no rebound, no guarding Ext:  2 plus pulses, no edema, no cyanosis, no clubbing Skin:  No rashes no nodules Neuro:  CN II through XII intact, motor grossly intact  EKG - nsr with ventricular pacing  DEVICE  Normal device function.  See PaceArt for details.   Assess/Plan: 1. CHB - he is asymptomatic s/p PPM insertion. 2. PPM - his St. Jude DDD PM is working normally.  3. Chronic systolic /diastolic heart failure - his symptoms are class 2 down from 3. He is encouraged to avoid salty foods and to take his current medical therapy. I asked him to reduce his dose  of torsemide. 4. PAF - he has maintained NSR very nicely. He will continue his anti-coagulation.  David [name].D.

## 2019-09-14 ENCOUNTER — Other Ambulatory Visit: Payer: Self-pay

## 2019-09-14 ENCOUNTER — Ambulatory Visit (HOSPITAL_COMMUNITY)
Admission: RE | Admit: 2019-09-14 | Discharge: 2019-09-14 | Disposition: A | Discharge status: Home / Self Care | Payer: Medicare HMO | Source: Ambulatory Visit | Attending: Cardiology | Admitting: Cardiology

## 2019-09-14 ENCOUNTER — Encounter (HOSPITAL_COMMUNITY): Payer: Self-pay | Admitting: Cardiology

## 2019-09-14 VITALS — BP 100/60 | HR 79 | Wt 168.8 lb

## 2019-09-14 DIAGNOSIS — E1122 Type 2 diabetes mellitus with diabetic chronic kidney disease: Secondary | ICD-10-CM | POA: Diagnosis not present

## 2019-09-14 DIAGNOSIS — I5022 Chronic systolic (congestive) heart failure: Secondary | ICD-10-CM | POA: Diagnosis not present

## 2019-09-14 DIAGNOSIS — Z7901 Long term (current) use of anticoagulants: Secondary | ICD-10-CM | POA: Insufficient documentation

## 2019-09-14 DIAGNOSIS — I43 Cardiomyopathy in diseases classified elsewhere: Secondary | ICD-10-CM

## 2019-09-14 DIAGNOSIS — I2510 Atherosclerotic heart disease of native coronary artery without angina pectoris: Secondary | ICD-10-CM | POA: Insufficient documentation

## 2019-09-14 DIAGNOSIS — Z8249 Family history of ischemic heart disease and other diseases of the circulatory system: Secondary | ICD-10-CM | POA: Diagnosis not present

## 2019-09-14 DIAGNOSIS — I130 Hypertensive heart and chronic kidney disease with heart failure and stage 1 through stage 4 chronic kidney disease, or unspecified chronic kidney disease: Secondary | ICD-10-CM | POA: Diagnosis not present

## 2019-09-14 DIAGNOSIS — E854 Organ-limited amyloidosis: Secondary | ICD-10-CM | POA: Diagnosis not present

## 2019-09-14 DIAGNOSIS — I480 Paroxysmal atrial fibrillation: Secondary | ICD-10-CM | POA: Diagnosis not present

## 2019-09-14 DIAGNOSIS — I350 Nonrheumatic aortic (valve) stenosis: Secondary | ICD-10-CM | POA: Insufficient documentation

## 2019-09-14 DIAGNOSIS — E1142 Type 2 diabetes mellitus with diabetic polyneuropathy: Secondary | ICD-10-CM | POA: Insufficient documentation

## 2019-09-14 DIAGNOSIS — R0602 Shortness of breath: Secondary | ICD-10-CM | POA: Diagnosis present

## 2019-09-14 DIAGNOSIS — Z87891 Personal history of nicotine dependence: Secondary | ICD-10-CM | POA: Diagnosis not present

## 2019-09-14 DIAGNOSIS — N183 Chronic kidney disease, stage 3 unspecified: Secondary | ICD-10-CM | POA: Diagnosis not present

## 2019-09-14 LAB — BASIC METABOLIC PANEL
Anion gap: 13 (ref 5–15)
BUN: 40 mg/dL — ABNORMAL HIGH (ref 8–23)
CO2: 25 mmol/L (ref 22–32)
Calcium: 9.4 mg/dL (ref 8.9–10.3)
Chloride: 99 mmol/L (ref 98–111)
Creatinine, Ser: 1.53 mg/dL — ABNORMAL HIGH (ref 0.61–1.24)
GFR calc Af Amer: 49 mL/min — ABNORMAL LOW (ref >60)
GFR calc non Af Amer: 42 mL/min — ABNORMAL LOW (ref >60)
Glucose, Bld: 103 mg/dL — ABNORMAL HIGH (ref 70–99)
Potassium: 4.7 mmol/L (ref 3.5–5.1)
Sodium: 137 mmol/L (ref 135–145)

## 2019-09-14 NOTE — Patient Instructions (Addendum)
Take Torsemide to 40mg  EVERY DAY   Labs today We will only contact you if something comes back abnormal or we need to make some changes. Otherwise no news is good news!   We contacted the device clinic to enroll you in the monitoring program. They will contact you with further details of this.    You have been referred to the PREP class at the [facility].  You will be contacted to discuss this class.    Your physician recommends that you schedule a follow-up appointment in: 3 months with Dr Aundra Dubin  Next appointment: Tuesday, Juky 20th, 2021 at 12pm Garage code [id number]   Please call office at [phone] option [phone] if you have any questions or concerns.   At the [facility], you and your health needs are our priority. As part of our continuing mission to provide you with exceptional heart care, we have created designated Provider Care Teams. These Care Teams include your primary Cardiologist (physician) and Advanced Practice Providers (APPs- Physician Assistants and Nurse Practitioners) who all work together to provide you with the care you need, when you need it.   You may see any of the following providers on your designated Care Team at your next follow up: [name] Dr Glori Bickers . Dr Loralie Champagne . Darrick Grinder, NP . Lyda Jester, PA . Audry Riles, PharmD   Please be sure to bring in all your medications bottles to every appointment.

## 2019-09-14 NOTE — Progress Notes (Signed)
Correspondence with Sharman Cheek, she will call patient to discuss ICM monitoring

## 2019-09-14 NOTE — Progress Notes (Signed)
PCP: Lajean Manes, MD Cardiology: Dr. Irish Lack HF Cardiology: Dr. Aundra Dubin  79 y.o. with history of complete heart block, CKD stage 3, paroxysmal atrial fibrillation, CAD, and chronic systolic CHF was referred by Dr. Irish Lack for evaluation of possible cardiac amyloidosis.  In 1999, he had LAD atherectomy.  Patient developed complete heart block in 2012 with PPM placement.  He had a stress PET done at that time that was not suggestive of ischemia.   Paroxysmal atrial fibrillation was noted during device interrogation and apixaban was started.  Echo in 12/19 was noted to show EF 45-50% with severe LVH.    Echo was done again in 12/20, showing EF 35-40%, severe LVH with concern for amyloidosis, moderate RV dysfunction, low gradient moderate AS, IVC dilated.  Given concern for cardiac amyloidosis, urine immunofixation was done and was negative, and serum myeloma panel was done and was negative.  PYP scan was done and was suggestive of cardiac amyloidosis.  TTR amyloidosis genetic testing was negative, suggesting wild type disease.   Cardiolite in 2/21 showed EF 40%, no ischemia or infarction.   He returns for followup of CHF.  Weight is down 2 lbs.  He is now taking torsemide 20 mg bid on most days, sometimes misses the afternoon dose.  Recent volume overload in earlier April based on device interrogation.  This corresponded to a trip he and his wife took to [place].  He thinks that the sodium was high in his diet during that time.  Currently feels back to normal.  He gets tired on walks.  He is short of breath walking up hills but can walk up a flight of stairs without difficulty.  He has trouble with balance but no lightheadedness.  No chest pain.  No orthopnea/PND.    St Jude device interrogation: Thoracic impedance currently at baseline but was low earlier this month corresponding to a trip to [place].  No AF, 99% BiV pacing.     Labs (11/20): K 4.7, creatinine 1.12 Labs (12/20): Urine  immunofixation and serum myeloma panel negative.  Labs (1/21): K 3.6, creatinine 1.65, mildly increased Kappa/Lamba free light chain ratio Labs (2/21): K 4.1, creatinine 1.89, LDL 63, HDL 52 Labs (3/21): K 4, creatinine 1.81  PMH: 1. Complete heart block: PPM placed 11/12.  2. CKD: Stage 3.  3. Type 2 diabetes  4. OSA 5. H/o spinal stenosis 6. HTN 7. Atrial fibrillation: Paroxysmal.  8. Gibert's disease 9. Aortic stenosis: Echo in 12/20 with low gradient moderate AS. .  10. CAD: LAD atherectomy in 1999.  - Cardiolite (2/21): EF 40%, no ischemia or infarction.  11. Chronic systolic CHF: St Jude CRT-P device upgrade.  - Echo (12/20): EF 35-40%, severe LVH, looks like amyloidosis, moderately decreased RV systolic function, mild MR, low gradient moderate AS (10 mmHg, 1.1 cm^2), IVC dilated.  - TTR amyloidosis: PYP scan 12/20 with intense uptake in myocardium, H/CL ratio 1.67 with grade 3 uptake visually. Urine immunofixation and serum myeloma panel negative.  Gene testing for TTR was negative suggesting wild type disease.   Social History   Socioeconomic History  . Marital status: Married    Spouse name: Not on file  . Number of children: Not on file  . Years of education: Not on file  . Highest education level: Not on file  Occupational History  . Not on file  Tobacco Use  . Smoking status: Former Research scientist (life sciences)  . Smokeless tobacco: Never Used  Substance and Sexual Activity  . Alcohol use:  Yes    Comment: occasional glass of wine  . Drug use: No  . Sexual activity: Not on file  Other Topics Concern  . Not on file  Social History Narrative  . Not on file   Social Determinants of Health   Financial Resource Strain:   . Difficulty of Paying Living Expenses:   Food Insecurity:   . Worried About Charity fundraiser in the Last Year:   . Arboriculturist in the Last Year:   Transportation Needs:   . Film/video editor (Medical):   [name] Lack of Transportation (Non-Medical):    Physical Activity:   . Days of Exercise per Week:   . Minutes of Exercise per Session:   Stress:   . Feeling of Stress :   Social Connections:   . Frequency of Communication with Friends and Family:   . Frequency of Social Gatherings with Friends and Family:   . Attends Religious Services:   . Active Member of Clubs or Organizations:   . Attends Archivist Meetings:   [name] Marital Status:   Intimate Partner Violence:   . Fear of Current or Ex-Partner:   . Emotionally Abused:   [name] Physically Abused:   . Sexually Abused:    Family History  Problem Relation Age of Onset  . Cancer Mother        Small cell lung cancer  . Diabetes Father        DM  . CAD Father   . Hypertension Father   . Heart disease Father   . Heart attack Father   . Hypertension Sister    ROS: All systems reviewed and negative except as per HPI.   BP 100/60   Pulse 79   Wt 76.6 kg (168 lb 12.8 oz)   SpO2 100%   BMI 22.89 kg/m  General: NAD Neck: No JVD, no thyromegaly or thyroid nodule.  Lungs: Clear to auscultation bilaterally with normal respiratory effort. CV: Nondisplaced PMI.  Heart regular S1/S2, no S3/S4, 2/6 early SEM RUSB.  Trace ankle edema.  No carotid bruit.  Normal pedal pulses.  Abdomen: Soft, nontender, no hepatosplenomegaly, no distention.  Skin: Intact without lesions or rashes.  Neurologic: Alert and oriented x 3.  Psych: Normal affect. Extremities: No clubbing or cyanosis.  HEENT: Normal.   Assessment/Plan: 1. Chronic systolic CHF: Echo from [id number] was reviewed, showed EF around 35-40% with severe LVH with appearance concerning for cardiac amyloidosis, moderate RV dysfunction, low gradient moderate AS.  He has renal dysfunction and symptoms consistent with peripheral neuropathy.  He has also had cardiac conduction abnormalities.  Presentation is concerning for cardiac amyloidosis, with slow progressive more suggestive of ATTR amyloidosis.  Myeloma panel and urine  immunofixation negative so AL amyloidosis unlikely. The Kappa/Lambda light chain ratio was mildly elevated, but reviewed this with Dr Irene Limbo and suspect slightly abnormal serum K/L free light ratios likely from his renal insufficiency with differential light chain excretion.  PYP scan was reviewed and markedly positive, suggesting ATTR amyloidosis. Negative ATTR gene testing suggests wild type disease ATTR amyloidosis. He has history of CAD but no recent chest pain, Cardiolite in 2/21 was negative.  NYHA class II-III symptoms now, volume status currently ok on exam and by Corvue, but by Corvue was noted to be volume overloaded a week or so ago.  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  - Change torsemide to 40 mg daily.  BMET today.      -  Continue Toprol XL 25 mg daily.  - Continue spironolactone 25 mg daily.   - Hold off on Entresto for now with soft BP and creatinine rise.   - I will refer him for ICM monitoring with Sharman Cheek.  - Refer to [facility] PREP class for exercise training.  2. CAD: H/o LAD atherectomy in 1999.  No chest pain. I think progressive CAD is unlikely as cause of worsening LV function, Cardiolite in 2/21 was normal.  - He is on Eliquis so no ASA.  - Continue statin.  3. Atrial fibrillation: Paroxysmal. NSR today.  - Continue apixaban, using 2.5 mg bid dosing with creatinine > 1.5 and age > 34.  4. CKD: Stage 3.  BMET today.  5. Aortic stenosis: Low gradient moderate AS on 12/20 echo.  6. Peripheral neuropathy: Possibly amyloid-related, prominent balance difficulty.  He has diet controlled diabetes so unlikely diabetic neuropathy.  7. Complete heart block: St Jude CRT-P system.   Followup in 3 months.   David [name] 09/14/2019

## 2019-09-15 ENCOUNTER — Telehealth: Payer: Self-pay

## 2019-09-15 LAB — PARATHYROID HORMONE, INTACT (NO CA): PTH: 56 pg/mL (ref 15–65)

## 2019-09-15 NOTE — Telephone Encounter (Signed)
-----   Message from Valeda Malm, RN sent at 09/14/2019 12:22 PM EDT ----- David [name],   Dr Aundra Dubin would like this patient to start the ICM monitoring program.  Thanks,  Linus Orn

## 2019-09-15 NOTE — Telephone Encounter (Addendum)
Patient referred to [facility] clinic by Dr Aundra Dubin.  Spoke with patient and ICM intro given.  Patient agreed to monthly ICM follow up.  He says he is feeling okay at this time and weighs daily.  Current weight 161.2 lbs. Patient reports he takes Torsemide 40 mg daily as instructed by Dr Aundra Dubin at 4/20 office visit. Explained remote transmission will be scheduled and automatically sent between 12 midnight and 6 AM on scheduled date.  He verbalized understanding.  Provided ICM direct number and encouraged to call if experiencing any fluid accumulation symptoms.

## 2019-09-15 NOTE — Telephone Encounter (Signed)
Talked with wife Santiago Glad today about PREP-talked about program, cost, location, next class start date of 5/3.  Confirmed pt is interested. Will call back on Friday with intake appt date.

## 2019-09-15 NOTE — Telephone Encounter (Signed)
Call placed to patient reference referral to PREP.  LVMT to inquire about starting on 5/3 request call back to discuss intake appt if agreeable.

## 2019-09-16 ENCOUNTER — Telehealth (HOSPITAL_COMMUNITY): Payer: Self-pay

## 2019-09-16 MED ORDER — TORSEMIDE 20 MG PO TABS: 40.0000 mg | ORAL_TABLET | Freq: Every day | ORAL | 3 refills | Status: DC

## 2019-09-16 NOTE — Telephone Encounter (Signed)
-----   Message from [name], RN sent at 09/15/2019  4:51 PM EDT ----- Barkley Bruns, I noticed Dr Aundra Dubin wants him to take Torsemide 40 mg daily but that does not match epic.  Can you update that?  Dr Lovena Le had him on a different dosage but I read [facility]'s notes to change to daily.  I spoke with patient today and have him scheduled.  Thanks for your help.  ----- Message ----- From: Valeda Malm, RN Sent: 09/14/2019  12:22 PM EDT To: [name], RN  Leonie Man,   Dr Aundra Dubin would like this patient to start the ICM monitoring program.  Thanks,  Linus Orn

## 2019-09-16 NOTE — Telephone Encounter (Signed)
Script updated for Torsemide. Pt was seen in clinic and advised to take 40mg  daily however script was not updated at the time. Script now changed to correct dose per MD recommendations at visit.

## 2019-09-17 ENCOUNTER — Telehealth: Payer: Self-pay

## 2019-09-17 NOTE — Telephone Encounter (Signed)
Call placed to patient to schedule PREP intake appt for 4/28 at 11am.

## 2019-09-22 ENCOUNTER — Other Ambulatory Visit: Payer: Self-pay | Admitting: Interventional Cardiology

## 2019-09-23 NOTE — Progress Notes (Signed)
[facility]   Patient Details  Name: David [name] MRN: [id number] Date of Birth: 04/17/1941 Age: 79 y.o. PCP: Lajean Manes, MD  Vitals:   09/23/19 1016  BP: 112/68  Pulse: 72  SpO2: 99%  Weight: 168 lb 9.6 oz (76.5 kg)  Height: 6' (1.829 m)     [facility] Eval - 09/23/19 1000      Referral    Referring Provider  Aundra Dubin    Reason for referral  Inactivity;Heart Failure    Program Start Date  09/27/19      Measurement   Neck measurement  15 Inches    Waist Circumference  40 inches      Information for Trainer   Goals  exercise more consistently    Current Exercise  walks occasionally    Orthopedic Concerns  R shoulder limited ROM    Pertinent Medical History  HF, PreDM, kidney issues, pacer, OSA     Current Barriers  walking long distances    Restrictions/Precautions  Fall risk    Medications that affect exercise  Beta blocker;Medication causing dizziness/drowsiness   pt says no to medication side effects but comments BP is low     Timed Up and Go (TUGS)   Timed Up and Go  High risk >13 seconds      Mobility and Daily Activities   I find it easy to walk up or down two or more flights of stairs.  2    I have no trouble taking out the trash.  4    I do housework such as vacuuming and dusting on my own without difficulty.  3    I can easily lift a gallon of milk (8lbs).  4    I can easily walk a mile.  3    I have no trouble reaching into high cupboards or reaching down to pick up something from the floor.  4    I do not have trouble doing out-door work such as Armed forces logistics/support/administrative officer, raking leaves, or gardening.  2      Mobility and Daily Activities   I feel younger than my age.  4    I feel independent.  4    I feel energetic.  3    I live an active life.   3    I feel strong.  3    I feel healthy.  3    I feel active as other people my age.  3      How fit and strong are you.   Fit and Strong Total Score  45      Past Medical History:   Diagnosis Date  . Aortic stenosis    a.mild-mod by echo 06/2017.  . Ascending aorta dilatation (HCC)    a. mildly dilated aortic root and mod dilated ascending aorta by echo 06/2017.  [name] Chronic diastolic CHF (congestive heart failure) ([place])   . CKD (chronic kidney disease), stage III   . Complete heart block (HCC)    a. s/p STJ pacemaker in 02/2011 - Dr. [name]  . Coronary artery disease    a. LAD atherectomy 1999.  . Gilbert's syndrome   . Hypertension   . Insomnia, unspecified   . Pacemaker-St Judes 07/16/2011  . PAF (paroxysmal atrial fibrillation) ([place])    a. identified on pacemaker interrogation; on Eliquis.  . Peripheral neuropathy   . Pure hypercholesterolemia   . Restless leg syndrome   . Spermatocele  near left testicle 2 cm  . Spinal stenosis, lumbar   . Thrombocytopenia ([place])    Averaging 140,000 since 2008  . Tubular adenoma 09/2012   Dr. Watt Climes repeat 4 years  . Type II or unspecified type diabetes mellitus without mention of complication, not stated as uncontrolled   . Unspecified sleep apnea    Past Surgical History:  Procedure Laterality Date  . APPENDECTOMY  1957  . CHOLECYSTECTOMY  1988  . CORONARY STENT PLACEMENT    . HEMORROIDECTOMY  1980  . HERNIA REPAIR Bilateral   . LEAD INSERTION N/A 05/05/2019   Procedure: LEAD INSERTION;  Surgeon: Evans Lance, MD;  Location: [place] [facility];  Service: Cardiovascular;  Laterality: N/A;  . pacermaker  07/16/11   Dr. [name]  . PPM GENERATOR CHANGEOUT N/A 05/05/2019   Procedure: PPM GENERATOR CHANGEOUT;  Surgeon: Evans Lance, MD;  Location: [place] [facility];  Service: Cardiovascular;  Laterality: N/A;  . TEE WITHOUT CARDIOVERSION N/A 06/14/2014   Procedure: TRANSESOPHAGEAL ECHOCARDIOGRAM (TEE);  Surgeon: [name], MD;  Location: [facility] ENDOSCOPY;  Service: Cardiovascular;  Laterality: N/A;   Social History   Tobacco Use  Smoking Status Former Smoker  Smokeless Tobacco Never Used   Will start PREP on  09/27/19 at 230p-345p on M&W x 12 wks.      [name] 09/23/2019, 10:21 AM

## 2019-09-23 NOTE — Telephone Encounter (Signed)
Prescription refill request for Eliquis received.  Last office visit: [place], 09/14/2019 Scr: 1.53, 09/14/2019 Age: 79 y.o. Weight: 76.6 kg   Prescription refill sent.

## 2019-09-27 NOTE — Progress Notes (Signed)
[facility] PREP Weekly Session   Patient Details  Name: David [name] MRN: [id number] Date of Birth: 1941/03/20 Age: 79 y.o. PCP: Lajean Manes, MD  There were no vitals filed for this visit.  [employer] Weekly seesion - 09/27/19 1600      Weekly Session   Topic Discussed  Goal setting and welcome to the program    Classes attended to date  1      Handouts: scale of perceived exertion, weight training record   [name] 09/27/2019, 4:29 PM

## 2019-10-04 ENCOUNTER — Ambulatory Visit (INDEPENDENT_AMBULATORY_CARE_PROVIDER_SITE_OTHER): Payer: Medicare HMO

## 2019-10-04 DIAGNOSIS — Z950 Presence of cardiac pacemaker: Secondary | ICD-10-CM

## 2019-10-04 DIAGNOSIS — I5022 Chronic systolic (congestive) heart failure: Secondary | ICD-10-CM | POA: Diagnosis not present

## 2019-10-04 NOTE — Progress Notes (Signed)
[facility] PREP Weekly Session   Patient Details  Name: David [name] MRN: [id number] Date of Birth: 1940-12-11 Age: 79 y.o. PCP: Lajean Manes, MD  Vitals:   10/04/19 1624  BP: 112/60  Pulse: 70  Weight: 166 lb (75.3 kg)    [employer] Weekly seesion - 10/04/19 1600      Weekly Session   Topic Discussed  Importance of resistance training;Other ways to be active   initial sodium talk   Minutes exercised this week  95 minutes    Classes attended to date  3      Fun things since last meeting: had friends over for lunch Grateful for: our community Nutrition celebration: had good salads and vegetables Barriers/struggles: lack of sleep  [name] 10/04/2019, 4:25 PM

## 2019-10-05 NOTE — Progress Notes (Signed)
EPIC Encounter for ICM Monitoring  Patient Name: David [name] is a 79 y.o. male Date: 10/05/2019 Primary Care Physican: Lajean Manes, MD Primary Cardiologist:  Aundra Dubin Electrophysiologist: Santina Evans Pacing: >99%  10/05/2019 Weight: 162.8 lbs  (baseline of 161-162 lbs)  AT/AF Burden: 0% (taking Eliquis)       1st ICM remote transmission. Heart Failure questions reviewed.  Pt asymptomatic for fluid symptoms.  He reports eating restaurant foods such as chinese food in last week.   Corvue Thoracic impedance normal but was suggesting possible fluid accumulation from 09/27/19 - 10/03/19.   Prescribed:   Torsemide 40 mg take 1 tablet daily. Klor Con 20 mEq take 1 tablet daily Spironolactone 25 mg take 1 tablet daily  Recommendations: Reinforced limiting salt intake to < 2000 mg daily and avoiding restaurant foods if possible due to high salt content.  Encouraged to call if experiencing fluid symptoms.  Follow-up plan: ICM clinic phone appointment on 11/08/2019.   91 day device clinic remote transmission 11/01/2019.      Copy of ICM check sent to Dr. Lovena Le.   3 month ICM trend: 10/04/2019    1 Year ICM trend:       [name], RN 10/05/2019 3:01 PM

## 2019-11-01 ENCOUNTER — Ambulatory Visit (INDEPENDENT_AMBULATORY_CARE_PROVIDER_SITE_OTHER): Payer: Medicare HMO | Admitting: *Deleted

## 2019-11-01 DIAGNOSIS — I442 Atrioventricular block, complete: Secondary | ICD-10-CM | POA: Diagnosis not present

## 2019-11-01 LAB — CUP PACEART REMOTE DEVICE CHECK
Battery Remaining Longevity: 58 mo
Battery Remaining Percentage: 95.5 %
Battery Voltage: 2.98 V
Brady Statistic AP VP Percent: 86 %
Brady Statistic AP VS Percent: 1 %
Brady Statistic AS VP Percent: 14 %
Brady Statistic AS VS Percent: 1 %
Brady Statistic RA Percent Paced: 86 %
Date Time Interrogation Session: 20210607020015
Implantable Lead Implant Date: 20121031
Implantable Lead Implant Date: 20121031
Implantable Lead Implant Date: 20201209
Implantable Lead Implant Date: 20201209
Implantable Lead Location: 753858
Implantable Lead Location: 753859
Implantable Lead Location: 753860
Implantable Lead Location: 753862
Implantable Lead Model: 3830
Implantable Pulse Generator Implant Date: 20201209
Lead Channel Impedance Value: 300 Ohm
Lead Channel Impedance Value: 350 Ohm
Lead Channel Impedance Value: 430 Ohm
Lead Channel Pacing Threshold Amplitude: 0.75 V
Lead Channel Pacing Threshold Amplitude: 0.75 V
Lead Channel Pacing Threshold Amplitude: 0.75 V
Lead Channel Pacing Threshold Pulse Width: 0.5 ms
Lead Channel Pacing Threshold Pulse Width: 0.8 ms
Lead Channel Pacing Threshold Pulse Width: 1 ms
Lead Channel Sensing Intrinsic Amplitude: 2.1 mV
Lead Channel Setting Pacing Amplitude: 2 V
Lead Channel Setting Pacing Amplitude: 2 V
Lead Channel Setting Pacing Amplitude: 2.5 V
Lead Channel Setting Pacing Pulse Width: 0.8 ms
Lead Channel Setting Pacing Pulse Width: 1 ms
Lead Channel Setting Sensing Sensitivity: 4 mV
Pulse Gen Model: 3222
Pulse Gen Serial Number: 9138824

## 2019-11-03 NOTE — Progress Notes (Signed)
Remote pacemaker transmission.   

## 2019-11-08 ENCOUNTER — Telehealth: Payer: Self-pay

## 2019-11-08 ENCOUNTER — Ambulatory Visit (INDEPENDENT_AMBULATORY_CARE_PROVIDER_SITE_OTHER): Payer: Medicare HMO

## 2019-11-08 DIAGNOSIS — Z950 Presence of cardiac pacemaker: Secondary | ICD-10-CM

## 2019-11-08 DIAGNOSIS — I5022 Chronic systolic (congestive) heart failure: Secondary | ICD-10-CM | POA: Diagnosis not present

## 2019-11-08 NOTE — Telephone Encounter (Signed)
Remote ICM transmission received.  Attempted call to patient regarding ICM remote transmission and left detailed message per DPR.  Advised to return call for any fluid symptoms or questions. Next ICM remote transmission scheduled 12/13/2019.     

## 2019-11-08 NOTE — Progress Notes (Signed)
EPIC Encounter for ICM Monitoring  Patient Name: David [name] is a 79 y.o. male Date: 11/08/2019 Primary Care Physican: Lajean Manes, MD Primary Cardiologist:  Aundra Dubin Electrophysiologist: Santina Evans Pacing: >99%          10/05/2019 Weight: 162.8 lbs  (baseline of 161-162 lbs)  AT/AF Burden: 0% (taking Eliquis)                                                           Attempted call to patient and unable to reach.  Left detailed message per DPR regarding transmission. Transmission reviewed.    Corvue Thoracic impedance normal.   Prescribed:   Torsemide 40 mg take 1 tablet daily. Klor Con 20 mEq take 1 tablet daily Spironolactone 25 mg take 1 tablet daily  Recommendations:  Left voice mail with ICM number and encouraged to call if experiencing any fluid symptoms.  Follow-up plan: ICM clinic phone appointment on 12/13/2019.   91 day device clinic remote transmission 02/01/2020.    Office visit with Dr Aundra Dubin on 12/14/2019.  Copy of ICM check sent to Dr. Lovena Le.   3 month ICM trend: 11/08/2019    1 Year ICM trend:       [name], RN 11/08/2019 1:09 PM

## 2019-12-08 ENCOUNTER — Encounter (HOSPITAL_COMMUNITY): Payer: Self-pay

## 2019-12-13 NOTE — Progress Notes (Signed)
[facility] Report   Patient Details  Name: David [name] MRN: [id number] Date of Birth: 1940-12-02 Age: 79 y.o. PCP: Lajean Manes, MD  Vitals:   12/13/19 1556  BP: 92/68  Pulse: 83  SpO2: 99%  Weight: 153 lb (69.4 kg)  Height: 6' (1.829 m)      [name] Eval - 12/13/19 1500      Referral    Referring Provider [facility] Start Date 12/13/19   Finished      Measurement   Neck measurement 15 Inches    Waist Circumference 37.5 inches      Mobility and Daily Activities   I find it easy to walk up or down two or more flights of stairs. 3    I have no trouble taking out the trash. 4    I do housework such as vacuuming and dusting on my own without difficulty. 4    I can easily lift a gallon of milk (8lbs). 4    I can easily walk a mile. 2    I have no trouble reaching into high cupboards or reaching down to pick up something from the floor. 4    I do not have trouble doing out-door work such as Armed forces logistics/support/administrative officer, raking leaves, or gardening. 2      Mobility and Daily Activities   I feel younger than my age. 4    I feel independent. 4    I feel energetic. 3    I live an active life.  4    I feel strong. 3    I feel healthy. 3    I feel active as other people my age. 4      How fit and strong are you.   Fit and Strong Total Score 48          Past Medical History:  Diagnosis Date  . Aortic stenosis    a.mild-mod by echo 06/2017.  . Ascending aorta dilatation (HCC)    a. mildly dilated aortic root and mod dilated ascending aorta by echo 06/2017.  [name] Chronic diastolic CHF (congestive heart failure) ([place])   . CKD (chronic kidney disease), stage III   . Complete heart block (HCC)    a. s/p STJ pacemaker in 02/2011 - Dr. [name]  . Coronary artery disease    a. LAD atherectomy 1999.  . Gilbert's syndrome   . Hypertension   . Insomnia, unspecified   . Pacemaker-St Judes 07/16/2011  . PAF (paroxysmal atrial fibrillation) ([place])    a. identified on  pacemaker interrogation; on Eliquis.  . Peripheral neuropathy   . Pure hypercholesterolemia   . Restless leg syndrome   . Spermatocele    near left testicle 2 cm  . Spinal stenosis, lumbar   . Thrombocytopenia ([place])    Averaging 140,000 since 2008  . Tubular adenoma 09/2012   Dr. Watt Climes repeat 4 years  . Type II or unspecified type diabetes mellitus without mention of complication, not stated as uncontrolled   . Unspecified sleep apnea    Past Surgical History:  Procedure Laterality Date  . APPENDECTOMY  1957  . CHOLECYSTECTOMY  1988  . CORONARY STENT PLACEMENT    . HEMORROIDECTOMY  1980  . HERNIA REPAIR Bilateral   . LEAD INSERTION N/A 05/05/2019   Procedure: LEAD INSERTION;  Surgeon: Evans Lance, MD;  Location: [place] [facility];  Service: Cardiovascular;  Laterality: N/A;  . pacermaker  07/16/11  Dr. [name]  . PPM GENERATOR CHANGEOUT N/A 05/05/2019   Procedure: PPM GENERATOR CHANGEOUT;  Surgeon: Evans Lance, MD;  Location: [place] [facility];  Service: Cardiovascular;  Laterality: N/A;  . TEE WITHOUT CARDIOVERSION N/A 06/14/2014   Procedure: TRANSESOPHAGEAL ECHOCARDIOGRAM (TEE);  Surgeon: [name], MD;  Location: [facility] ENDOSCOPY;  Service: Cardiovascular;  Laterality: N/A;   Social History   Tobacco Use  Smoking Status Former Smoker  Smokeless Tobacco Never Used   Improvements in fitness assessments: Cardio march test improved from 175 to 284 Sit to stand improved from 12 to 20 Bicep curls improved from 17 to 23 Balance also improved in Tandem category  Weekly exercise   cardio 150 min (goal met) Strength 30 min Flexibility 15 min Attended all sessions of class and exercise offered  Encouraged to continue exercise regimen moving forward Will review goals with him next week.          [name] 12/13/2019, 4:00 PM

## 2019-12-14 ENCOUNTER — Ambulatory Visit (HOSPITAL_COMMUNITY)
Admission: RE | Admit: 2019-12-14 | Discharge: 2019-12-14 | Disposition: A | Discharge status: Home / Self Care | Payer: Medicare HMO | Source: Ambulatory Visit | Attending: Cardiology | Admitting: Cardiology

## 2019-12-14 ENCOUNTER — Other Ambulatory Visit: Payer: Self-pay

## 2019-12-14 ENCOUNTER — Encounter (HOSPITAL_COMMUNITY): Payer: Self-pay | Admitting: Cardiology

## 2019-12-14 VITALS — BP 98/60 | HR 72 | Wt 157.0 lb

## 2019-12-14 DIAGNOSIS — G4733 Obstructive sleep apnea (adult) (pediatric): Secondary | ICD-10-CM | POA: Diagnosis not present

## 2019-12-14 DIAGNOSIS — Z8249 Family history of ischemic heart disease and other diseases of the circulatory system: Secondary | ICD-10-CM | POA: Insufficient documentation

## 2019-12-14 DIAGNOSIS — E1142 Type 2 diabetes mellitus with diabetic polyneuropathy: Secondary | ICD-10-CM | POA: Insufficient documentation

## 2019-12-14 DIAGNOSIS — I2510 Atherosclerotic heart disease of native coronary artery without angina pectoris: Secondary | ICD-10-CM | POA: Diagnosis not present

## 2019-12-14 DIAGNOSIS — I480 Paroxysmal atrial fibrillation: Secondary | ICD-10-CM

## 2019-12-14 DIAGNOSIS — I5022 Chronic systolic (congestive) heart failure: Secondary | ICD-10-CM

## 2019-12-14 DIAGNOSIS — E854 Organ-limited amyloidosis: Secondary | ICD-10-CM

## 2019-12-14 DIAGNOSIS — Z950 Presence of cardiac pacemaker: Secondary | ICD-10-CM | POA: Diagnosis not present

## 2019-12-14 DIAGNOSIS — Z833 Family history of diabetes mellitus: Secondary | ICD-10-CM | POA: Diagnosis not present

## 2019-12-14 DIAGNOSIS — Z87891 Personal history of nicotine dependence: Secondary | ICD-10-CM | POA: Insufficient documentation

## 2019-12-14 DIAGNOSIS — I130 Hypertensive heart and chronic kidney disease with heart failure and stage 1 through stage 4 chronic kidney disease, or unspecified chronic kidney disease: Secondary | ICD-10-CM | POA: Insufficient documentation

## 2019-12-14 DIAGNOSIS — N183 Chronic kidney disease, stage 3 unspecified: Secondary | ICD-10-CM | POA: Insufficient documentation

## 2019-12-14 DIAGNOSIS — I5032 Chronic diastolic (congestive) heart failure: Secondary | ICD-10-CM

## 2019-12-14 DIAGNOSIS — Z7901 Long term (current) use of anticoagulants: Secondary | ICD-10-CM | POA: Diagnosis not present

## 2019-12-14 DIAGNOSIS — E1122 Type 2 diabetes mellitus with diabetic chronic kidney disease: Secondary | ICD-10-CM | POA: Diagnosis not present

## 2019-12-14 DIAGNOSIS — I43 Cardiomyopathy in diseases classified elsewhere: Secondary | ICD-10-CM

## 2019-12-14 DIAGNOSIS — I350 Nonrheumatic aortic (valve) stenosis: Secondary | ICD-10-CM | POA: Insufficient documentation

## 2019-12-14 LAB — BASIC METABOLIC PANEL
Anion gap: 9 (ref 5–15)
BUN: 42 mg/dL — ABNORMAL HIGH (ref 8–23)
CO2: 28 mmol/L (ref 22–32)
Calcium: 9.2 mg/dL (ref 8.9–10.3)
Chloride: 99 mmol/L (ref 98–111)
Creatinine, Ser: 1.58 mg/dL — ABNORMAL HIGH (ref 0.61–1.24)
GFR calc Af Amer: 47 mL/min — ABNORMAL LOW (ref >60)
GFR calc non Af Amer: 40 mL/min — ABNORMAL LOW (ref >60)
Glucose, Bld: 116 mg/dL — ABNORMAL HIGH (ref 70–99)
Potassium: 4.4 mmol/L (ref 3.5–5.1)
Sodium: 136 mmol/L (ref 135–145)

## 2019-12-14 LAB — CBC
HCT: 41.1 % (ref 39.0–52.0)
Hemoglobin: 13.3 g/dL (ref 13.0–17.0)
MCH: 31.5 pg (ref 26.0–34.0)
MCHC: 32.4 g/dL (ref 30.0–36.0)
MCV: 97.4 fL (ref 80.0–100.0)
Platelets: 148 10*3/uL — ABNORMAL LOW (ref 150–400)
RBC: 4.22 MIL/uL (ref 4.22–5.81)
RDW: 13.4 % (ref 11.5–15.5)
WBC: 6.7 10*3/uL (ref 4.0–10.5)
nRBC: 0 % (ref 0.0–0.2)

## 2019-12-14 LAB — TSH: TSH: 5.138 u[IU]/mL — ABNORMAL HIGH (ref 0.350–4.500)

## 2019-12-14 MED ORDER — SPIRONOLACTONE 25 MG PO TABS: 12.5000 mg | ORAL_TABLET | Freq: Every day | ORAL | 3 refills | Status: DC

## 2019-12-14 MED ORDER — METOPROLOL SUCCINATE ER 25 MG PO TB24: 12.5000 mg | ORAL_TABLET | Freq: Every day | ORAL | Status: DC

## 2019-12-14 MED ORDER — DAPAGLIFLOZIN PROPANEDIOL 10 MG PO TABS: 10.0000 mg | ORAL_TABLET | Freq: Every day | ORAL | 6 refills | Status: DC

## 2019-12-14 MED ORDER — POTASSIUM CHLORIDE CRYS ER 20 MEQ PO TBCR: 10.0000 meq | EXTENDED_RELEASE_TABLET | Freq: Every day | ORAL | 3 refills | Status: DC

## 2019-12-14 MED ORDER — TORSEMIDE 20 MG PO TABS: 20.0000 mg | ORAL_TABLET | Freq: Every day | ORAL | 3 refills | Status: DC

## 2019-12-14 NOTE — Patient Instructions (Signed)
Decrease Torsemide to 20 mg Daily  Decrease Potassium to 10 meq (1/2 tab) Daily  Start Farxiga 10 mg Daily  Take Spironolactone 12.5 mg (1/2 tab) DAILY AT BEDTIME  Take Metoprolol 12.5 mg (1/2 tab) DAILY AT BEDTIME  Take Ensure twice a day  Labs done today, your results will be available in MyChart, we will contact you for abnormal readings.  Labs needed in 10 days  Your physician has requested that you have an echocardiogram. Echocardiography is a painless test that uses sound waves to create images of your heart. It provides your doctor with information about the size and shape of your heart and how well your heart's chambers and valves are working. This procedure takes approximately one hour. There are no restrictions for this procedure.  Your physician recommends that you schedule a follow-up appointment in: 3 months  If you have any questions or concerns before your next appointment please send [place] a message through [place] or call our office at [phone].    TO LEAVE A MESSAGE FOR THE NURSE SELECT OPTION 2, PLEASE LEAVE A MESSAGE INCLUDING: . YOUR NAME . DATE OF BIRTH . CALL BACK NUMBER . REASON FOR CALL**this is important as we prioritize the call backs  [place] AS LONG AS YOU CALL BEFORE 4:00 PM  At the [facility], you and your health needs are our priority. As part of our continuing mission to provide you with exceptional heart care, we have created designated Provider Care Teams. These Care Teams include your primary Cardiologist (physician) and Advanced Practice Providers (APPs- Physician Assistants and Nurse Practitioners) who all work together to provide you with the care you need, when you need it.   You may see any of the following providers on your designated Care Team at your next follow up: [name] Dr Glori Bickers . Dr Loralie Champagne . Darrick Grinder, NP . Lyda Jester, PA . Audry Riles, PharmD   Please be  sure to bring in all your medications bottles to every appointment.

## 2019-12-14 NOTE — Progress Notes (Signed)
PCP: Lajean Manes, MD Cardiology: Dr. Irish Lack HF Cardiology: Dr. Aundra Dubin  79 y.o. with history of complete heart block, CKD stage 3, paroxysmal atrial fibrillation, CAD, and chronic systolic CHF was referred by Dr. Irish Lack for evaluation of possible cardiac amyloidosis.  In 1999, he had LAD atherectomy.  Patient developed complete heart block in 2012 with PPM placement.  He had a stress PET done at that time that was not suggestive of ischemia.   Paroxysmal atrial fibrillation was noted during device interrogation and apixaban was started.  Echo in 12/19 was noted to show EF 45-50% with severe LVH.    Echo was done again in 12/20, showing EF 35-40%, severe LVH with concern for amyloidosis, moderate RV dysfunction, low gradient moderate AS, IVC dilated.  Given concern for cardiac amyloidosis, urine immunofixation was done and was negative, and serum myeloma panel was done and was negative.  PYP scan was done and was suggestive of cardiac amyloidosis.  TTR amyloidosis genetic testing was negative, suggesting wild type disease.   Cardiolite in 2/21 showed EF 40%, no ischemia or infarction.   He returns for followup of CHF.  BP has been running lower recently, I decreased his Toprol XL and spironolactone.  He has mild lightheadedness with standing occasionally, no falls.  He feels like his exercise tolerance is worse.  He is out of breath walking around the block or walking up stairs. No BRBPR/melena.  Weight is down. Appetite is relatively poor.  No chest pain.    St Jude device interrogation: Thoracic impedance currently at baseline but was low earlier this month.  No AF, >99% BiV pacing.     Labs (11/20): K 4.7, creatinine 1.12 Labs (12/20): Urine immunofixation and serum myeloma panel negative.  Labs (1/21): K 3.6, creatinine 1.65, mildly increased Kappa/Lamba free light chain ratio Labs (2/21): K 4.1, creatinine 1.89, LDL 63, HDL 52 Labs (3/21): K 4, creatinine 1.81 Labs (4/21): K 4.7,  creatinine 1.53  PMH: 1. Complete heart block: PPM placed 11/12.  2. CKD: Stage 3.  3. Type 2 diabetes  4. OSA 5. H/o spinal stenosis 6. HTN 7. Atrial fibrillation: Paroxysmal.  8. Gibert's disease 9. Aortic stenosis: Echo in 12/20 with low gradient moderate AS. .  10. CAD: LAD atherectomy in 1999.  - Cardiolite (2/21): EF 40%, no ischemia or infarction.  11. Chronic systolic CHF: St Jude CRT-P device upgrade.  - Echo (12/20): EF 35-40%, severe LVH, looks like amyloidosis, moderately decreased RV systolic function, mild MR, low gradient moderate AS (10 mmHg, 1.1 cm^2), IVC dilated.  - TTR amyloidosis: PYP scan 12/20 with intense uptake in myocardium, H/CL ratio 1.67 with grade 3 uptake visually. Urine immunofixation and serum myeloma panel negative.  Gene testing for TTR was negative suggesting wild type disease.   Social History   Socioeconomic History  . Marital status: Married    Spouse name: Not on file  . Number of children: Not on file  . Years of education: Not on file  . Highest education level: Not on file  Occupational History  . Not on file  Tobacco Use  . Smoking status: Former Research scientist (life sciences)  . Smokeless tobacco: Never Used  Vaping Use  . Vaping Use: Never used  Substance and Sexual Activity  . Alcohol use: Yes    Comment: occasional glass of wine  . Drug use: No  . Sexual activity: Not on file  Other Topics Concern  . Not on file  Social History Narrative  . Not on  file   Social Determinants of Health   Financial Resource Strain:   . Difficulty of Paying Living Expenses:   Food Insecurity:   . Worried About Charity fundraiser in the Last Year:   . Arboriculturist in the Last Year:   Transportation Needs:   . Film/video editor (Medical):   [name] Lack of Transportation (Non-Medical):   Physical Activity:   . Days of Exercise per Week:   . Minutes of Exercise per Session:   Stress:   . Feeling of Stress :   Social Connections:   . Frequency of  Communication with Friends and Family:   . Frequency of Social Gatherings with Friends and Family:   . Attends Religious Services:   . Active Member of Clubs or Organizations:   . Attends Archivist Meetings:   [name] Marital Status:   Intimate Partner Violence:   . Fear of Current or Ex-Partner:   . Emotionally Abused:   [name] Physically Abused:   . Sexually Abused:    Family History  Problem Relation Age of Onset  . Cancer Mother        Small cell lung cancer  . Diabetes Father        DM  . CAD Father   . Hypertension Father   . Heart disease Father   . Heart attack Father   . Hypertension Sister    ROS: All systems reviewed and negative except as per HPI.   BP 98/60   Pulse 72   Wt 71.2 kg (157 lb)   SpO2 98%   BMI 21.29 kg/m  General: NAD Neck: No JVD, no thyromegaly or thyroid nodule.  Lungs: Clear to auscultation bilaterally with normal respiratory effort. CV: Nondisplaced PMI.  Heart regular S1/S2, no S3/S4, 1/6 SEM RUSB.  No peripheral edema.  No carotid bruit.  Normal pedal pulses.  Abdomen: Soft, nontender, no hepatosplenomegaly, no distention.  Skin: Intact without lesions or rashes.  Neurologic: Alert and oriented x 3.  Psych: Normal affect. Extremities: No clubbing or cyanosis.  HEENT: Normal.   Assessment/Plan: 1. Chronic systolic CHF: Echo from 99/37 was reviewed, showed EF around 35-40% with severe LVH with appearance concerning for cardiac amyloidosis, moderate RV dysfunction, low gradient moderate AS.  He has renal dysfunction and symptoms consistent with peripheral neuropathy.  He has also had cardiac conduction abnormalities.  Presentation is concerning for cardiac amyloidosis, with slow progressive more suggestive of ATTR amyloidosis.  Myeloma panel and urine immunofixation negative so AL amyloidosis unlikely. The Kappa/Lambda light chain ratio was mildly elevated, but reviewed this with Dr Irene Limbo and suspect slightly abnormal serum K/L free light  ratios likely from his renal insufficiency with differential light chain excretion.  PYP scan was reviewed and markedly positive, suggesting ATTR amyloidosis. Negative ATTR gene testing suggests wild type disease ATTR amyloidosis. He has history of CAD but no recent chest pain, Cardiolite in 2/21 was negative.  He has a Physiological scientist.  NYHA class II-III symptoms now, volume status currently ok on exam and by Corvue. He feels like his exercise tolerance is gradually worsening. - With lower BP, will keep Toprol XL at 12.5 mg daily and spironolactone at 12.5 mg daily and have him take these meds at bedtime.  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  - I will start him on Farxiga 10 mg daily and decrease torsemide to 20 mg daily, KCl to 10 mEq daily.  BMET today and again in 10 days.      - No BP room for Entresto.    - I will arrange for echo to reassess LV function post-CRT.  - Encouraged regular exercise.  2. CAD: H/o LAD atherectomy in 1999.  No chest pain. I think progressive CAD is unlikely as cause of worsening LV function, Cardiolite in 2/21 was normal.  - He is on Eliquis so no ASA.  - Continue statin.  3. Atrial fibrillation: Paroxysmal. NSR today.  - Continue apixaban, using 2.5 mg bid dosing with creatinine > 1.5 and age > 66.  4. CKD: Stage 3.  BMET today.  5. Aortic stenosis: Low gradient moderate AS on 12/20 echo.  6. Peripheral neuropathy: Possibly amyloid-related, prominent balance difficulty.  He has diet controlled diabetes so unlikely diabetic neuropathy.  7. Complete heart block: St Jude CRT-P system.  8. Poor appetite: Encouraged Ensure bid.   Followup in 3 months.   Loralie Champagne 12/14/2019

## 2019-12-15 LAB — PTH, INTACT AND CALCIUM
Calcium, Total (PTH): 9.3 mg/dL (ref 8.6–10.2)
PTH: 50 pg/mL (ref 15–65)

## 2019-12-16 ENCOUNTER — Telehealth (HOSPITAL_COMMUNITY): Payer: Self-pay

## 2019-12-16 DIAGNOSIS — R7989 Other specified abnormal findings of blood chemistry: Secondary | ICD-10-CM

## 2019-12-16 NOTE — Telephone Encounter (Signed)
Patient has pending lab appt,will add t4 and t3 on to be included in those labs.   Orders Placed This Encounter  Procedures  . T3, free    Standing Status:   Future    Standing Expiration Date:   12/15/2020    Order Specific Question:   Release to patient    Answer:   Immediate  . T4, free    Standing Status:   Future    Standing Expiration Date:   12/15/2020    Order Specific Question:   Release to patient    Answer:   Immediate

## 2019-12-16 NOTE — Telephone Encounter (Signed)
-----   Message from Larey Dresser, MD sent at 12/15/2019 10:02 PM EDT ----- TSH mildly elevated.  Would check free T4 and free T3.  Probably no treatment needed.

## 2019-12-21 ENCOUNTER — Ambulatory Visit (INDEPENDENT_AMBULATORY_CARE_PROVIDER_SITE_OTHER): Payer: Medicare HMO

## 2019-12-21 DIAGNOSIS — I5032 Chronic diastolic (congestive) heart failure: Secondary | ICD-10-CM | POA: Diagnosis not present

## 2019-12-21 DIAGNOSIS — Z950 Presence of cardiac pacemaker: Secondary | ICD-10-CM | POA: Diagnosis not present

## 2019-12-21 MED ORDER — TORSEMIDE 20 MG PO TABS: 40.0000 mg | ORAL_TABLET | Freq: Every day | ORAL | 3 refills | Status: DC

## 2019-12-21 NOTE — Progress Notes (Signed)
Spoke with patient.  Advised Dr Aundra Dubin recommended he increase Torsemide back to 40 mg daily and patient verbalized understanding.  He has enough Torsemide on hand and does not a refill at this time.  Recommended BMET in a week but patient is out of town this week and next week.  He is already scheduled for BMET on 8/11 and cannot get one before that date.

## 2019-12-21 NOTE — Progress Notes (Signed)
EPIC Encounter for ICM Monitoring  Patient Name: David [name] is a 79 y.o. male Date: 12/21/2019 Primary Care Physican: Lajean Manes, MD Primary Cardiologist:McLean Electrophysiologist:Taylor Bi-V Pacing:>99% 12/14/2019 Office Weight:157 lbs  AT/AF Burden: 0% (taking Eliquis)   Spoke with patient.  He has been traveling since 12/15/2019 and currently in [place].  He reports legs feel heavy and tight but does not notice any swelling.  Breathing is fine and has not weighed since starting vacation.   CorvueThoracic impedance suggesting possible ongoing fluid accumulation since 12/16/2019 (Torsemide decreased 7/20).   Prescribed:   Torsemide20 mg take 1 tablet daily (decreased from 40 mg on 7/20 and started [place]).  Potassium 20 mEq take 0.5 tablet (98mEq total) once a day  Spironolactone 25 mg take 0.5 tablet (12.5 mg total) daily  Labs: 12/14/2019 Creatinine 1.58, BUN 42, Potassium 4.4, Sodium 136, GFR 40-47 09/14/2019 Creatinine 1.53, BUN 40, Potassium 4.7, Sodium 137, GFR 42-49  07/28/2019 Creatinine 1.81, BUN 53, Potassium 4.0, Sodium 135, GFR 34-40  07/19/2019 Creatinine 1.89, BUN 44, Potassium 4.1, Sodium 137, GFR 33-38  A complete set of results can be found in Results Review.  Recommendations:  Copy sent to Dr Aundra Dubin for review and recommendations.  Follow-up plan: ICM clinic phone appointment on8/06/2019 (manual send) to recheck fluid levels. 91 day device clinic remote transmission 02/01/2020.  EP/Cardiology Office Visits: 03/15/2020 with Dr. Aundra Dubin.  Recall for 08/11/2020 with Dr Lovena Le.    Copy of ICM check sent to Dr. Lovena Le.   3 month ICM trend: 12/21/2019    1 Year ICM trend:       [name], RN 12/21/2019 10:57 AM

## 2019-12-21 NOTE — Progress Notes (Signed)
Increase torsemide back to 40 mg daily, BMET in 1 week.

## 2019-12-22 ENCOUNTER — Other Ambulatory Visit (HOSPITAL_COMMUNITY): Payer: Self-pay | Admitting: Cardiology

## 2019-12-29 ENCOUNTER — Ambulatory Visit (INDEPENDENT_AMBULATORY_CARE_PROVIDER_SITE_OTHER): Payer: Medicare HMO

## 2019-12-29 DIAGNOSIS — I5032 Chronic diastolic (congestive) heart failure: Secondary | ICD-10-CM

## 2019-12-29 DIAGNOSIS — Z950 Presence of cardiac pacemaker: Secondary | ICD-10-CM

## 2019-12-31 ENCOUNTER — Encounter (HOSPITAL_COMMUNITY): Payer: Self-pay

## 2019-12-31 ENCOUNTER — Other Ambulatory Visit (HOSPITAL_COMMUNITY): Payer: Self-pay | Admitting: *Deleted

## 2019-12-31 MED ORDER — METOPROLOL SUCCINATE ER 25 MG PO TB24: 12.5000 mg | ORAL_TABLET | Freq: Every day | ORAL | 3 refills | Status: DC

## 2019-12-31 NOTE — Progress Notes (Signed)
EPIC Encounter for ICM Monitoring  Patient Name: David [name] is a 79 y.o. male Date: 12/31/2019 Primary Care Physican: Lajean Manes, MD Primary Cardiologist:McLean Electrophysiologist:Taylor Bi-V Pacing:>99% 12/14/2019 Office Weight:157 lbs  AT/AF Burden: 0% (taking Eliquis)   Spoke with patient and he is doing well.  He denies any fluid symptoms   CorvueThoracic impedance returned to normal since Torsmide dosage increased back to 40 mg. .    Prescribed:   Torsemide20 mg take 2 tablets (40 mg total) daily   Potassium 20 mEq take 0.5 tablet (60mEq total) once a day  Spironolactone 25 mg take 0.5 tablet (12.5 mg total) daily  Labs: 12/14/2019 Creatinine 1.58, BUN 42, Potassium 4.4, Sodium 136, GFR 40-47 09/14/2019 Creatinine 1.53, BUN 40, Potassium 4.7, Sodium 137, GFR 42-49  07/28/2019 Creatinine 1.81, BUN 53, Potassium 4.0, Sodium 135, GFR 34-40  07/19/2019 Creatinine 1.89, BUN 44, Potassium 4.1, Sodium 137, GFR 33-38  A complete set of results can be found in Results Review.  Recommendations:No changes and encouraged to call if experiencing any fluid symptoms.  Follow-up plan: ICM clinic phone appointment on8/30/2021. 91 day device clinic remote transmission9/11/2019.  EP/Cardiology Office Visits: 03/15/2020 with Dr. Aundra Dubin.  Recall for 08/11/2020 with Dr Lovena Le.    Copy of ICM check sent to Dr. Lovena Le.   3 month ICM trend: 12/29/2019    1 Year ICM trend:       [name], RN 12/31/2019 5:04 PM

## 2020-01-05 ENCOUNTER — Other Ambulatory Visit: Payer: Self-pay

## 2020-01-05 ENCOUNTER — Ambulatory Visit (HOSPITAL_COMMUNITY)
Admission: RE | Admit: 2020-01-05 | Discharge: 2020-01-05 | Disposition: A | Discharge status: Home / Self Care | Payer: Medicare HMO | Source: Ambulatory Visit | Attending: Internal Medicine | Admitting: Internal Medicine

## 2020-01-05 ENCOUNTER — Ambulatory Visit (HOSPITAL_COMMUNITY)
Admission: RE | Admit: 2020-01-05 | Discharge: 2020-01-05 | Disposition: A | Discharge status: Home / Self Care | Payer: Medicare HMO | Source: Ambulatory Visit | Attending: Cardiology | Admitting: Cardiology

## 2020-01-05 DIAGNOSIS — R7989 Other specified abnormal findings of blood chemistry: Secondary | ICD-10-CM

## 2020-01-05 DIAGNOSIS — Z950 Presence of cardiac pacemaker: Secondary | ICD-10-CM | POA: Diagnosis not present

## 2020-01-05 DIAGNOSIS — I5032 Chronic diastolic (congestive) heart failure: Secondary | ICD-10-CM | POA: Insufficient documentation

## 2020-01-05 DIAGNOSIS — I4891 Unspecified atrial fibrillation: Secondary | ICD-10-CM | POA: Diagnosis not present

## 2020-01-05 DIAGNOSIS — I2510 Atherosclerotic heart disease of native coronary artery without angina pectoris: Secondary | ICD-10-CM | POA: Insufficient documentation

## 2020-01-05 DIAGNOSIS — E785 Hyperlipidemia, unspecified: Secondary | ICD-10-CM | POA: Insufficient documentation

## 2020-01-05 DIAGNOSIS — E119 Type 2 diabetes mellitus without complications: Secondary | ICD-10-CM | POA: Diagnosis not present

## 2020-01-05 DIAGNOSIS — I350 Nonrheumatic aortic (valve) stenosis: Secondary | ICD-10-CM | POA: Diagnosis not present

## 2020-01-05 DIAGNOSIS — I712 Thoracic aortic aneurysm, without rupture: Secondary | ICD-10-CM | POA: Diagnosis not present

## 2020-01-05 LAB — ECHOCARDIOGRAM COMPLETE
AR max vel: 1.06 cm2
AV Area VTI: 0.79 cm2
AV Area mean vel: 1.12 cm2
AV Mean grad: 15 mmHg
AV Peak grad: 26.3 mmHg
Ao pk vel: 2.57 m/s
Area-P 1/2: 2.22 cm2
Calc EF: 46.4 %
S' Lateral: 3.4 cm
Single Plane A2C EF: 45.6 %
Single Plane A4C EF: 44.8 %

## 2020-01-05 LAB — BASIC METABOLIC PANEL
Anion gap: 12 (ref 5–15)
BUN: 43 mg/dL — ABNORMAL HIGH (ref 8–23)
CO2: 28 mmol/L (ref 22–32)
Calcium: 9.3 mg/dL (ref 8.9–10.3)
Chloride: 97 mmol/L — ABNORMAL LOW (ref 98–111)
Creatinine, Ser: 1.58 mg/dL — ABNORMAL HIGH (ref 0.61–1.24)
GFR calc Af Amer: 47 mL/min — ABNORMAL LOW (ref >60)
GFR calc non Af Amer: 40 mL/min — ABNORMAL LOW (ref >60)
Glucose, Bld: 103 mg/dL — ABNORMAL HIGH (ref 70–99)
Potassium: 3.9 mmol/L (ref 3.5–5.1)
Sodium: 137 mmol/L (ref 135–145)

## 2020-01-05 LAB — T4, FREE: Free T4: 0.86 ng/dL (ref 0.61–1.12)

## 2020-01-05 NOTE — Progress Notes (Signed)
Echocardiogram 2D Echocardiogram has been performed.  Oneal Deputy Majorie Santee 01/05/2020, 11:51 AM

## 2020-01-06 LAB — T3, FREE: T3, Free: 2.3 pg/mL (ref 2.0–4.4)

## 2020-01-21 ENCOUNTER — Encounter (HOSPITAL_COMMUNITY): Payer: Self-pay

## 2020-01-24 ENCOUNTER — Ambulatory Visit (INDEPENDENT_AMBULATORY_CARE_PROVIDER_SITE_OTHER): Payer: Medicare HMO

## 2020-01-24 DIAGNOSIS — Z950 Presence of cardiac pacemaker: Secondary | ICD-10-CM | POA: Diagnosis not present

## 2020-01-24 DIAGNOSIS — I5032 Chronic diastolic (congestive) heart failure: Secondary | ICD-10-CM | POA: Diagnosis not present

## 2020-01-26 ENCOUNTER — Telehealth: Payer: Self-pay

## 2020-01-26 NOTE — Progress Notes (Signed)
EPIC Encounter for ICM Monitoring  Patient Name: David [name] is a 79 y.o. male Date: 01/26/2020 Primary Care Physican: Lajean Manes, MD Primary Cardiologist:McLean Electrophysiologist:Taylor Bi-V Pacing:>99% 7/20/2021OfficeWeight:157lbs  AT/AF Burden: 0% (taking Eliquis)   Attempted call to patient and unable to reach.  Left detailed message per DPR regarding transmission. Transmission reviewed.   CorvueThoracic impedancesuggesting normal fluid levels.    Prescribed:   Torsemide20mg  take 2 tablets (40 mg total) daily   Potassium 20 mEq take0.5tablet (44mEq total) once a day  Spironolactone 25 mg take0.5tablet (12.5 mg total)daily  Labs: 01/05/2020 Creatinine 1.58, BUN 43, Potassium 3.9, Sodium 137, GFR 40-47 12/14/2019 Creatinine1.58, BUN42, Potassium4.4, Sodium136, [id number] 09/14/2019 Creatinine1.53, BUN40, Potassium4.7, [id number], [id number]  07/28/2019 Creatinine1.81, BUN53, Potassium4.0, Sodium135, GFR34-40  07/19/2019 Creatinine1.89, BUN44, Potassium4.1, [id number], [id number] A complete set of results can be found in Results Review.  Recommendations:Left voice mail with ICM number and encouraged to call if experiencing any fluid symptoms.  Follow-up plan: ICM clinic phone appointment on10/08/2019. 91 day device clinic remote transmission9/11/2019.  EP/Cardiology Office Visits:03/15/2020 with Dr.McLean. Recall for 08/11/2020 with Dr Lovena Le.   Copy of ICM check sent to Dr.Taylor.   3 month ICM trend: 01/24/2020    1 Year ICM trend:       [name], RN 01/26/2020 11:03 AM

## 2020-01-26 NOTE — Telephone Encounter (Signed)
Remote ICM transmission received.  Attempted call to patient regarding ICM remote transmission and left detailed message per DPR.  Advised to return call for any fluid symptoms or questions. Next ICM remote transmission scheduled 02/28/2020.

## 2020-01-31 LAB — CUP PACEART REMOTE DEVICE CHECK
Battery Remaining Longevity: 58 mo
Battery Remaining Percentage: 95.5 %
Battery Voltage: 2.98 V
Brady Statistic AP VP Percent: 88 %
Brady Statistic AP VS Percent: 1 %
Brady Statistic AS VP Percent: 12 %
Brady Statistic AS VS Percent: 1 %
Brady Statistic RA Percent Paced: 88 %
Date Time Interrogation Session: 20210830032028
Implantable Lead Implant Date: 20121031
Implantable Lead Implant Date: 20121031
Implantable Lead Implant Date: 20201209
Implantable Lead Implant Date: 20201209
Implantable Lead Location: 753858
Implantable Lead Location: 753859
Implantable Lead Location: 753860
Implantable Lead Location: 753862
Implantable Lead Model: 3830
Implantable Pulse Generator Implant Date: 20201209
Lead Channel Impedance Value: 290 Ohm
Lead Channel Impedance Value: 390 Ohm
Lead Channel Impedance Value: 440 Ohm
Lead Channel Pacing Threshold Amplitude: 0.75 V
Lead Channel Pacing Threshold Amplitude: 0.75 V
Lead Channel Pacing Threshold Amplitude: 0.75 V
Lead Channel Pacing Threshold Pulse Width: 0.5 ms
Lead Channel Pacing Threshold Pulse Width: 0.8 ms
Lead Channel Pacing Threshold Pulse Width: 1 ms
Lead Channel Sensing Intrinsic Amplitude: 1 mV
Lead Channel Setting Pacing Amplitude: 2 V
Lead Channel Setting Pacing Amplitude: 2 V
Lead Channel Setting Pacing Amplitude: 2.5 V
Lead Channel Setting Pacing Pulse Width: 0.8 ms
Lead Channel Setting Pacing Pulse Width: 1 ms
Lead Channel Setting Sensing Sensitivity: 4 mV
Pulse Gen Model: 3222
Pulse Gen Serial Number: 9138824

## 2020-02-01 ENCOUNTER — Ambulatory Visit (INDEPENDENT_AMBULATORY_CARE_PROVIDER_SITE_OTHER): Payer: Medicare HMO | Admitting: *Deleted

## 2020-02-01 DIAGNOSIS — I442 Atrioventricular block, complete: Secondary | ICD-10-CM

## 2020-02-01 LAB — CUP PACEART REMOTE DEVICE CHECK
Battery Remaining Longevity: 58 mo
Battery Remaining Percentage: 95.5 %
Battery Voltage: 2.98 V
Brady Statistic AP VP Percent: 88 %
Brady Statistic AP VS Percent: 1 %
Brady Statistic AS VP Percent: 12 %
Brady Statistic AS VS Percent: 1 %
Brady Statistic RA Percent Paced: 88 %
Date Time Interrogation Session: 20210906020025
Implantable Lead Implant Date: 20121031
Implantable Lead Implant Date: 20121031
Implantable Lead Implant Date: 20201209
Implantable Lead Implant Date: 20201209
Implantable Lead Location: 753858
Implantable Lead Location: 753859
Implantable Lead Location: 753860
Implantable Lead Location: 753862
Implantable Lead Model: 3830
Implantable Pulse Generator Implant Date: 20201209
Lead Channel Impedance Value: 300 Ohm
Lead Channel Impedance Value: 380 Ohm
Lead Channel Impedance Value: 430 Ohm
Lead Channel Pacing Threshold Amplitude: 0.75 V
Lead Channel Pacing Threshold Amplitude: 0.75 V
Lead Channel Pacing Threshold Amplitude: 0.75 V
Lead Channel Pacing Threshold Pulse Width: 0.5 ms
Lead Channel Pacing Threshold Pulse Width: 0.8 ms
Lead Channel Pacing Threshold Pulse Width: 1 ms
Lead Channel Sensing Intrinsic Amplitude: 1.9 mV
Lead Channel Setting Pacing Amplitude: 2 V
Lead Channel Setting Pacing Amplitude: 2 V
Lead Channel Setting Pacing Amplitude: 2.5 V
Lead Channel Setting Pacing Pulse Width: 0.8 ms
Lead Channel Setting Pacing Pulse Width: 1 ms
Lead Channel Setting Sensing Sensitivity: 4 mV
Pulse Gen Model: 3222
Pulse Gen Serial Number: 9138824

## 2020-02-03 NOTE — Progress Notes (Signed)
Remote pacemaker transmission.   

## 2020-02-06 ENCOUNTER — Ambulatory Visit: Payer: Medicare HMO | Attending: Internal Medicine

## 2020-02-06 DIAGNOSIS — Z23 Encounter for immunization: Secondary | ICD-10-CM

## 2020-02-06 NOTE — Progress Notes (Signed)
   Covid-19 Vaccination Clinic  Name:  Francis Doenges    MRN: [id number] DOB: [birth year]  02/06/2020  Mr. Tillett was observed post Covid-19 immunization for 15 minutes without incident. He was provided with Vaccine Information Sheet and instruction to access the V-Safe system.   Mr. Fornwalt was instructed to call 911 with any severe reactions post vaccine: [name] Difficulty breathing  . Swelling of face and throat  . A fast heartbeat  . A bad rash all over body  . Dizziness and weakness

## 2020-02-28 ENCOUNTER — Ambulatory Visit (INDEPENDENT_AMBULATORY_CARE_PROVIDER_SITE_OTHER): Payer: Medicare HMO

## 2020-02-28 DIAGNOSIS — Z950 Presence of cardiac pacemaker: Secondary | ICD-10-CM

## 2020-02-28 DIAGNOSIS — I5032 Chronic diastolic (congestive) heart failure: Secondary | ICD-10-CM

## 2020-02-29 ENCOUNTER — Telehealth: Payer: Self-pay

## 2020-02-29 NOTE — Progress Notes (Signed)
EPIC Encounter for ICM Monitoring  Patient Name: David [name] is a 79 y.o. male Date: 02/29/2020 Primary Care Physican: Lajean Manes, MD Primary Cardiologist:McLean Electrophysiologist:Taylor Bi-V Pacing:>99% 7/20/2021OfficeWeight:157lbs  AT/AF Burden: <1% (taking Eliquis)   Attempted call to patient and unable to reach.  Left detailed message per DPR regarding transmission. Transmission reviewed.   CorvueThoracic impedancesuggesting normal fluid levels.  Prescribed:   Torsemide20mg  take2tablets (40 mg total)daily   Potassium 20 mEq take0.5tablet (46mEq total) once a day  Spironolactone 25 mg take0.5tablet (12.5 mg total)daily  Labs: 01/05/2020 Creatinine 1.58, BUN 43, Potassium 3.9, Sodium 137, GFR 40-47 12/14/2019 Creatinine1.58, BUN42, Potassium4.4, Sodium136, GFR40-47 09/14/2019 Creatinine1.53, BUN40, Potassium4.7, [id number], [id number]  07/28/2019 Creatinine1.81, BUN53, Potassium4.0, Sodium135, GFR34-40  07/19/2019 Creatinine1.89, BUN44, Potassium4.1, [id number], [id number] A complete set of results can be found in Results Review.  Recommendations:Left voice mail with ICM number and encouraged to call if experiencing any fluid symptoms.  Follow-up plan: ICM clinic phone appointment on11/12/2019. 91 day device clinic remote transmission12/10/2019.  EP/Cardiology Office Visits:03/15/2020 with Dr.McLean. Recall for 08/11/2020 with Dr Lovena Le.   Copy of ICM check sent to Dr.Taylor.   3 month ICM trend: 02/28/2020    1 Year ICM trend:       [name], RN 02/29/2020 1:37 PM

## 2020-02-29 NOTE — Telephone Encounter (Signed)
Remote ICM transmission received.  Attempted call to patient regarding ICM remote transmission and left detailed message per DPR.  Advised to return call for any fluid symptoms or questions. Next ICM remote transmission scheduled 04/03/2020.     

## 2020-03-15 ENCOUNTER — Other Ambulatory Visit: Payer: Self-pay

## 2020-03-15 ENCOUNTER — Ambulatory Visit (HOSPITAL_COMMUNITY)
Admission: RE | Admit: 2020-03-15 | Discharge: 2020-03-15 | Disposition: A | Discharge status: Home / Self Care | Payer: Medicare HMO | Source: Ambulatory Visit | Attending: Cardiology | Admitting: Cardiology

## 2020-03-15 ENCOUNTER — Encounter (HOSPITAL_COMMUNITY): Payer: Self-pay | Admitting: Cardiology

## 2020-03-15 VITALS — BP 90/50 | HR 74 | Wt 152.6 lb

## 2020-03-15 DIAGNOSIS — I43 Cardiomyopathy in diseases classified elsewhere: Secondary | ICD-10-CM

## 2020-03-15 DIAGNOSIS — I130 Hypertensive heart and chronic kidney disease with heart failure and stage 1 through stage 4 chronic kidney disease, or unspecified chronic kidney disease: Secondary | ICD-10-CM | POA: Insufficient documentation

## 2020-03-15 DIAGNOSIS — I480 Paroxysmal atrial fibrillation: Secondary | ICD-10-CM | POA: Insufficient documentation

## 2020-03-15 DIAGNOSIS — E854 Organ-limited amyloidosis: Secondary | ICD-10-CM | POA: Diagnosis not present

## 2020-03-15 DIAGNOSIS — E1142 Type 2 diabetes mellitus with diabetic polyneuropathy: Secondary | ICD-10-CM | POA: Insufficient documentation

## 2020-03-15 DIAGNOSIS — Z7901 Long term (current) use of anticoagulants: Secondary | ICD-10-CM | POA: Diagnosis not present

## 2020-03-15 DIAGNOSIS — E1122 Type 2 diabetes mellitus with diabetic chronic kidney disease: Secondary | ICD-10-CM | POA: Insufficient documentation

## 2020-03-15 DIAGNOSIS — I350 Nonrheumatic aortic (valve) stenosis: Secondary | ICD-10-CM | POA: Diagnosis not present

## 2020-03-15 DIAGNOSIS — R0600 Dyspnea, unspecified: Secondary | ICD-10-CM

## 2020-03-15 DIAGNOSIS — I2510 Atherosclerotic heart disease of native coronary artery without angina pectoris: Secondary | ICD-10-CM | POA: Insufficient documentation

## 2020-03-15 DIAGNOSIS — Z950 Presence of cardiac pacemaker: Secondary | ICD-10-CM | POA: Diagnosis not present

## 2020-03-15 DIAGNOSIS — G4733 Obstructive sleep apnea (adult) (pediatric): Secondary | ICD-10-CM | POA: Insufficient documentation

## 2020-03-15 DIAGNOSIS — N183 Chronic kidney disease, stage 3 unspecified: Secondary | ICD-10-CM | POA: Insufficient documentation

## 2020-03-15 DIAGNOSIS — Z87891 Personal history of nicotine dependence: Secondary | ICD-10-CM | POA: Diagnosis not present

## 2020-03-15 DIAGNOSIS — I5022 Chronic systolic (congestive) heart failure: Secondary | ICD-10-CM | POA: Insufficient documentation

## 2020-03-15 LAB — BASIC METABOLIC PANEL
Anion gap: 13 (ref 5–15)
BUN: 49 mg/dL — ABNORMAL HIGH (ref 8–23)
CO2: 26 mmol/L (ref 22–32)
Calcium: 9.3 mg/dL (ref 8.9–10.3)
Chloride: 97 mmol/L — ABNORMAL LOW (ref 98–111)
Creatinine, Ser: 1.61 mg/dL — ABNORMAL HIGH (ref 0.61–1.24)
GFR, Estimated: 40 mL/min — ABNORMAL LOW (ref >60)
Glucose, Bld: 114 mg/dL — ABNORMAL HIGH (ref 70–99)
Potassium: 4 mmol/L (ref 3.5–5.1)
Sodium: 136 mmol/L (ref 135–145)

## 2020-03-15 LAB — FERRITIN: Ferritin: 82 ng/mL (ref 24–336)

## 2020-03-15 LAB — IRON AND TIBC
Iron: 74 ug/dL (ref 45–182)
Saturation Ratios: 18 % (ref 17.9–39.5)
TIBC: 417 ug/dL (ref 250–450)
UIBC: 343 ug/dL

## 2020-03-15 MED ORDER — TORSEMIDE 20 MG PO TABS
40.0000 mg | ORAL_TABLET | Freq: Every day | ORAL | 3 refills | Status: DC
Start: 2020-03-15 — End: 2020-06-15

## 2020-03-15 NOTE — Progress Notes (Signed)
PCP: Lajean Manes, MD Cardiology: Dr. Irish Lack HF Cardiology: Dr. Aundra Dubin  79 y.o. with history of complete heart block, CKD stage 3, paroxysmal atrial fibrillation, CAD, and chronic systolic CHF was referred by Dr. Irish Lack for evaluation of possible cardiac amyloidosis.  In 1999, he had LAD atherectomy.  Patient developed complete heart block in 2012 with PPM placement.  He had a stress PET done at that time that was not suggestive of ischemia.   Paroxysmal atrial fibrillation was noted during device interrogation and apixaban was started.  Echo in 12/19 was noted to show EF 45-50% with severe LVH.    Echo was done again in 12/20, showing EF 35-40%, severe LVH with concern for amyloidosis, moderate RV dysfunction, low gradient moderate AS, IVC dilated.  Given concern for cardiac amyloidosis, urine immunofixation was done and was negative, and serum myeloma panel was done and was negative.  PYP scan was done and was suggestive of cardiac amyloidosis.  TTR amyloidosis genetic testing was negative, suggesting wild type disease.   Cardiolite in 2/21 showed EF 40%, no ischemia or infarction.  Echo in 8/21 showed EF 35-40%, moderate LVH, normal RV, mild AS.   He returns for followup of CHF.  He has been stable recently.  Weight is down 5 lbs.  Has tingling and numbness in feet consistent with neuropathy. Stable breathing, no dyspnea walking on flat ground.  Would get short of breath walking up a hill.  No orthopnea/PND.  No chest pain.  Occasional mild lightheadedness with standing.  No falls.    ECG (personally reviewed): A-BiV paced   Labs (11/20): K 4.7, creatinine 1.12 Labs (12/20): Urine immunofixation and serum myeloma panel negative.  Labs (1/21): K 3.6, creatinine 1.65, mildly increased Kappa/Lamba free light chain ratio Labs (2/21): K 4.1, creatinine 1.89, LDL 63, HDL 52 Labs (3/21): K 4, creatinine 1.81 Labs (4/21): K 4.7, creatinine 1.53 Labs (8/21): K 3.9, creatinine 1.58, TSH mildly  elevated but free T3 and T4 normal  PMH: 1. Complete heart block: PPM placed 11/12.  2. CKD: Stage 3.  3. Type 2 diabetes  4. OSA 5. H/o spinal stenosis 6. HTN 7. Atrial fibrillation: Paroxysmal.  8. Gibert's disease 9. Aortic stenosis: Echo in 12/20 with low gradient moderate AS. .  10. CAD: LAD atherectomy in 1999.  - Cardiolite (2/21): EF 40%, no ischemia or infarction.  11. Chronic systolic CHF: St Jude CRT-P device upgrade.  - Echo (12/20): EF 35-40%, severe LVH, looks like amyloidosis, moderately decreased RV systolic function, mild MR, low gradient moderate AS (10 mmHg, 1.1 cm^2), IVC dilated.  - TTR amyloidosis: PYP scan 12/20 with intense uptake in myocardium, H/CL ratio 1.67 with grade 3 uptake visually. Urine immunofixation and serum myeloma panel negative.  Gene testing for TTR was negative suggesting wild type disease.  - Echo (8/21): EF 35-40%, moderate LVH, normal RV, severe RAE, moderate LAE, IVC normal, mild AS.   Social History   Socioeconomic History  . Marital status: Married    Spouse name: Not on file  . Number of children: Not on file  . Years of education: Not on file  . Highest education level: Not on file  Occupational History  . Not on file  Tobacco Use  . Smoking status: Former Research scientist (life sciences)  . Smokeless tobacco: Never Used  Vaping Use  . Vaping Use: Never used  Substance and Sexual Activity  . Alcohol use: Yes    Comment: occasional glass of wine  . Drug use: No  .  Sexual activity: Not on file  Other Topics Concern  . Not on file  Social History Narrative  . Not on file   Social Determinants of Health   Financial Resource Strain:   . Difficulty of Paying Living Expenses: Not on file  Food Insecurity:   . Worried About Charity fundraiser in the Last Year: Not on file  . Ran Out of Food in the Last Year: Not on file  Transportation Needs:   . Lack of Transportation (Medical): Not on file  . Lack of Transportation (Non-Medical): Not on file   Physical Activity:   . Days of Exercise per Week: Not on file  . Minutes of Exercise per Session: Not on file  Stress:   . Feeling of Stress : Not on file  Social Connections:   . Frequency of Communication with Friends and Family: Not on file  . Frequency of Social Gatherings with Friends and Family: Not on file  . Attends Religious Services: Not on file  . Active Member of Clubs or Organizations: Not on file  . Attends Archivist Meetings: Not on file  . Marital Status: Not on file  Intimate Partner Violence:   . Fear of Current or Ex-Partner: Not on file  . Emotionally Abused: Not on file  . Physically Abused: Not on file  . Sexually Abused: Not on file   Family History  Problem Relation Age of Onset  . Cancer Mother        Small cell lung cancer  . Diabetes Father        DM  . CAD Father   . Hypertension Father   . Heart disease Father   . Heart attack Father   . Hypertension Sister    ROS: All systems reviewed and negative except as per HPI.   BP (!) 90/50   Pulse 74   Wt 69.2 kg (152 lb 9.6 oz)   SpO2 99%   BMI 20.70 kg/m  General: NAD Neck: No JVD, no thyromegaly or thyroid nodule.  Lungs: Clear to auscultation bilaterally with normal respiratory effort. CV: Nondisplaced PMI.  Heart regular S1/S2, no S3/S4, 2/6 SEM RUSB.  No peripheral edema.  No carotid bruit.  Normal pedal pulses.  Abdomen: Soft, nontender, no hepatosplenomegaly, no distention.  Skin: Intact without lesions or rashes.  Neurologic: Alert and oriented x 3.  Psych: Normal affect. Extremities: No clubbing or cyanosis.  HEENT: Normal.   Assessment/Plan: 1. Chronic systolic CHF: Echo from 29/47 was reviewed, showed EF around 35-40% with severe LVH with appearance concerning for cardiac amyloidosis, moderate RV dysfunction, low gradient moderate AS.  He has renal dysfunction and symptoms consistent with peripheral neuropathy.  He has also had cardiac conduction abnormalities.   Presentation is concerning for cardiac amyloidosis, with slow progressive more suggestive of ATTR amyloidosis.  Myeloma panel and urine immunofixation negative so AL amyloidosis unlikely. The Kappa/Lambda light chain ratio was mildly elevated, but reviewed this with Dr Irene Limbo and suspect slightly abnormal serum K/L free light ratios likely from his renal insufficiency with differential light chain excretion.  PYP scan was reviewed and markedly positive, suggesting ATTR amyloidosis. Negative ATTR gene testing suggests wild type disease ATTR amyloidosis. He has history of CAD but no recent chest pain, Cardiolite in 2/21 was negative.  Echo in 8/21 with EF 35-40%, moderate LVH, normal RV.  He has a Physiological scientist.  NYHA class II symptoms now, volume status currently ok on exam. - With soft  BP, will keep Toprol XL at 12.5 mg daily and spironolactone at 12.5 mg daily and have him take these meds at bedtime.  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  - Continue Farxiga 10 mg daily.  - I think that he can decrease torsemide to 40 mg daily alternating with 20 mg daily. BMET today.     - No BP room for Entresto.    - Encouraged regular exercise.  2. CAD: H/o LAD atherectomy in 1999.  No chest pain. I think progressive CAD is unlikely as cause of worsening LV function, Cardiolite in 2/21 was normal.  - He is on Eliquis so no ASA.  - Continue statin.  3. Atrial fibrillation: Paroxysmal. NSR today.  - Continue apixaban, using 2.5 mg bid dosing with creatinine > 1.5 and age > 54.  4. CKD: Stage 3.  BMET today.  5. Aortic stenosis: Mild on 8/21 echo.  6. Peripheral neuropathy: Suspect amyloid-related, prominent balance difficulty.  He has diet controlled diabetes so unlikely diabetic neuropathy.  7. Complete heart block: St Jude CRT-P system.  8. Weight loss: He would like to see a nutritionist to help set up a dietary plan.   - I will refer to nutrition.   Followup in 3  months.   Loralie Champagne 03/15/2020

## 2020-03-15 NOTE — Patient Instructions (Addendum)
DECREASE Torsemide 40mg  (2 tablets) and 20mg  (1 tablet) Every other day  Labs done today, your results will be available in MyChart, we will contact you for abnormal readings.  You have been referred to Nutrition. They will call you to schedule an appointment  You have been referred to Pulmonary Rehabilitation. They will contact you to schedule an appointment  Your physician recommends that you return for a follow up visit in 3 months   If you have any questions or concerns before your next appointment please send [place] a message through [place] or call our office at [phone].    TO LEAVE A MESSAGE FOR THE NURSE SELECT OPTION 2, PLEASE LEAVE A MESSAGE INCLUDING: . YOUR NAME . DATE OF BIRTH . CALL BACK NUMBER . REASON FOR CALL**this is important as we prioritize the call backs  [place] AS LONG AS YOU CALL BEFORE 4:00 PM  At the [facility], you and your health needs are our priority. As part of our continuing mission to provide you with exceptional heart care, we have created designated Provider Care Teams. These Care Teams include your primary Cardiologist (physician) and Advanced Practice Providers (APPs- Physician Assistants and Nurse Practitioners) who all work together to provide you with the care you need, when you need it.   You may see any of the following providers on your designated Care Team at your next follow up: [name] Dr Glori Bickers . Dr Loralie Champagne . Darrick Grinder, NP . Lyda Jester, PA . Audry Riles, PharmD   Please be sure to bring in all your medications bottles to every appointment.

## 2020-03-16 ENCOUNTER — Encounter (HOSPITAL_COMMUNITY): Payer: Self-pay | Admitting: *Deleted

## 2020-03-16 NOTE — Progress Notes (Signed)
Received referral from Dr. Aundra Dubin for this pt to participate in pulmonary rehab with the the diagnosis of Chronic Systolic Heart Failure. Pt with echo in 2021 which showed EF 35-40%. Clinical review of pt follow up appt on 10/20 heart failure office note.  Pt with Covid Risk Score - 6. Pt appropriate for scheduling for Pulmonary rehab.  Will forward to support staff for scheduling and verification of insurance eligibility/benefits with pt consent. Cherre Huger, BSN Cardiac and Training and development officer

## 2020-04-03 ENCOUNTER — Telehealth: Payer: Self-pay | Admitting: Interventional Cardiology

## 2020-04-03 ENCOUNTER — Ambulatory Visit (INDEPENDENT_AMBULATORY_CARE_PROVIDER_SITE_OTHER): Payer: Medicare HMO

## 2020-04-03 ENCOUNTER — Other Ambulatory Visit: Payer: Self-pay | Admitting: Interventional Cardiology

## 2020-04-03 DIAGNOSIS — Z950 Presence of cardiac pacemaker: Secondary | ICD-10-CM

## 2020-04-03 DIAGNOSIS — I5022 Chronic systolic (congestive) heart failure: Secondary | ICD-10-CM

## 2020-04-03 MED ORDER — APIXABAN 2.5 MG PO TABS: 2.5000 mg | ORAL_TABLET | Freq: Two times a day (BID) | ORAL | 0 refills | Status: AC

## 2020-04-03 NOTE — Telephone Encounter (Signed)
Prescription refill request for Eliquis received.  Last office visit: Mclean, 03/15/2020 Scr: 1.61, 03/15/2020 Age: 79 yo Weight: 69.2 kg   Prescription refill sent to [facility], receipt confirmed by pharmacy.  Pt requesting short term supply be sent to local pharmacy while awaiting mail order rx.

## 2020-04-03 NOTE — Telephone Encounter (Signed)
Prescription refill request for Eliquis received.  Last office visit: Mclean, 03/15/2020 Scr: 1.61, 03/15/2020 Age: 79 yo Weight: 69.2 kg   Prescription refill sent.

## 2020-04-03 NOTE — Telephone Encounter (Signed)
Pt c/o medication issue:  1. Name of Medication: ELIQUIS 2.5 MG TABS tablet  2. How are you currently taking this medication (dosage and times per day)? As directed   3. Are you having a reaction (difficulty breathing--STAT)? No  4. What is your medication issue? Patient states he is completely out of medication and he is requesting to have a temporary supply sent to his local pharmacy ([place], [address] AT [place]) to hold him over until his refill from [facility] arrives. Please assist.

## 2020-04-07 NOTE — Progress Notes (Signed)
EPIC Encounter for ICM Monitoring  Patient Name: David [name] is a 79 y.o. male Date: 04/07/2020 Primary Care Physican: Lajean Manes, MD Primary Cardiologist:[place] Electrophysiologist:Taylor Bi-V Pacing:>99% 11/12/2021Weight:152lbs  AT/AF Burden: <1% (taking Eliquis)   Spoke with patient and reports feeling well at this time.  Denies fluid symptoms.    CorvueThoracic impedancenormal but was suggesting possible fluid accumulation from 10/22-11/4.  Prescribed:   Torsemide20mg  Take 2 tablets (40 mg total) by mouth daily. Alternate with 40mg  (2 tablets) and 20mg  (1 tablet) every other day  Potassium 20 mEq take1tablet (54mEq total) by mouth daily  Spironolactone 25 mg take0.5tablet (12.5 mg total)at bedtime  Labs: 03/21/2020 Creatinine 1.61, BUN 49, Potassium 4.0, Sodium 136, GFR 40 01/05/2020 Creatinine 1.58, BUN 43, Potassium 3.9, Sodium 137, GFR 40-47 12/14/2019 Creatinine1.58, BUN42, Potassium4.4, Sodium136, [id number] 09/14/2019 Creatinine1.53, BUN40, Potassium4.7, [id number], [id number]  07/28/2019 Creatinine1.81, BUN53, Potassium4.0, Sodium135, GFR34-40  07/19/2019 Creatinine1.89, BUN44, Potassium4.1, [id number], [id number] A complete set of results can be found in Results Review.  Recommendations:No changes and encouraged to call if experiencing any fluid symptoms.  Follow-up plan: ICM clinic phone appointment on12/13/2021. 91 day device clinic remote transmission12/10/2019.  EP/Cardiology Office Visits: Recall for 08/11/2020 with Dr Lovena Le.   Copy of ICM check sent to Dr.Taylor.   3 month ICM trend: 04/03/2020    1 Year ICM trend:       [name], RN 04/07/2020 2:41 PM

## 2020-04-11 ENCOUNTER — Encounter: Payer: Medicare HMO | Attending: Cardiology | Admitting: Dietician

## 2020-04-11 ENCOUNTER — Other Ambulatory Visit: Payer: Self-pay

## 2020-04-11 ENCOUNTER — Encounter: Payer: Self-pay | Admitting: Dietician

## 2020-04-11 VITALS — Ht 72.0 in | Wt 156.2 lb

## 2020-04-11 DIAGNOSIS — Z713 Dietary counseling and surveillance: Secondary | ICD-10-CM | POA: Diagnosis not present

## 2020-04-11 DIAGNOSIS — E1122 Type 2 diabetes mellitus with diabetic chronic kidney disease: Secondary | ICD-10-CM | POA: Insufficient documentation

## 2020-04-11 DIAGNOSIS — N183 Chronic kidney disease, stage 3 unspecified: Secondary | ICD-10-CM

## 2020-04-11 DIAGNOSIS — I5022 Chronic systolic (congestive) heart failure: Secondary | ICD-10-CM | POA: Insufficient documentation

## 2020-04-11 DIAGNOSIS — I130 Hypertensive heart and chronic kidney disease with heart failure and stage 1 through stage 4 chronic kidney disease, or unspecified chronic kidney disease: Secondary | ICD-10-CM | POA: Diagnosis present

## 2020-04-11 NOTE — Patient Instructions (Signed)
   Try the tofu recipe with salt free sauce  Nutrition supplement over ice with straw  When having a frozen dinner, check the label for sodium

## 2020-04-11 NOTE — Progress Notes (Signed)
Medical Nutrition Therapy: Visit start time: 1330  end time: 1610  Assessment:  Diagnosis: chronic systolic heart failure  Past medical history: cardiac amyloidosis, CHF, sleep apnea, CKD stage 3 Psychosocial issues/ stress concerns: pt rates stress level as "moderate to low" and feels "ok" about stress management skills   Preferred learning method:   Auditory  Visual  Hands-on  Current weight: 150 lbs  Height: 6' IBW: 170-200 lbs Medications, supplements: reconciled in medical record   Progress and evaluation:   Pt attended today's visit with wife  Pt reports he was diagnosed with a rare heart condition earlier this year and he feels the medication prescribed has decreased his appetite and taste changes   Pt estimates 20 lb weight loss since beginning of the year   Wife of pt confirms PO intake has decreased this year   Wife of pt reports different dietary needs make planning and cooking meals challenging  Pt reports wife does all of the planning, cooking, and purchasing of food   Wife of pt reports desire to cook more beans and soups for husband, but often conflicts with her low FODMAP diet guidelines   Pt states his main goals are to retain weight and preserve kidneys   Pt states MD recommended having 2 Boost nutrition supplements a day to help with adequate energy intake or 1 high protein (30g/container) nutrition supplement like Ensure Max or Premier Protein   Currently following a low sodium diet   Physical activity: 25 min stationary bike or walking, 3-4x/week   Dietary Intake:  Usual eating pattern includes 3 meals and 0-1 snacks per day. Dining out frequency: 2 meals per week.  Breakfast: oatmeal (1-2x/week); eggs with egg whites; toast cheddar cheese and honey with cottage cheese; dry cereal with milk (1x/week); orange Snack: nutrition supplement-Boost  Lunch: sandwich with ham and cheese lettuce, tomato, onion; soups with added no salt veggies, low/no sodium  broth, beans; tuna salad sandwich; chicken salad with pasta  Supper: fish (1-2x/week)/beef (1-2x/week)/pork chops/chicken (most nights) with rice and green salad and cooked veg  Beverages: coffee   Estimated protein needs  .8-1.0g/kg IBW(77.2 kg): 62-77g protein/day for preservation of lean body mass, adequate protein intake, and kidney function preservation.  Estimated energy needs 25-30kcal/kg IBW(77.2 kg) : 1930-2316 kcal/day  Pt may not be meeting energy or protein needs based on diet recall and discussion.    Nutrition Care Education: Basic nutrition: basic food groups, appropriate nutrient balance, appropriate meal and snack schedule, general nutrition guidelines   Advanced nutrition:  recipe modification, cooking techniques, food label reading Renal nutrition:   identifying food sources of potassium, magnesium, and phosphorus; moderate protein intake; adequate fluid intake   Heart Health:  importance of controlling BP, identifying high sodium foods, healthy and unhealthy fats, role of fiber  Nutritional Diagnosis:  NI-1.4 Inadequate energy intake As related to decreased ability to consume sufficient energy.  As evidenced by pt diet recall, report of decreased PO intake, loss/change in taste, unintended weight loss of 20 lbs, and medications that may affect appetite.  Intervention:  Discussion and instruction as noted above.  Additionally, pt mentioned change in taste and sometimes mineral aftertaste in mouth.  Advised pt to follow up MD about zinc supplementation as changes/loss of taste may be a sign of zinc deficiency or side effect of medication.   Reviewed firm tofu may be a great protein source they both can enjoy due to its low FODMAP profile for wife and great plant-based source of protein  for pt.   Emphasized importance of nutrition supplementation d/t decreased appetite and taste changes.  Established goals for additional positive changes.    Maintain adequate fruit and  vegetable intake   Incorporate vegetables at lunch and dinner (fresh, frozen, or "salt free/sodium free" if canned   Try canned fruit NOT in heavy syrup (mandarin oranges, peaches, pears, applesauce) for snacks + protein (cheese, PB)  Smoothies may be easier to consume that chewing F/V   Maintain physical activity    150 minutes per week is recommendation    Increase fiber intake   Eat at least 3 servings of whole grains a day  Maintain fruit and vegetable intake  Adequate fluid intake   Drink at least 64oz water daily, or MD recommendation    Avoid dark colas   Decrease sodium intake   Reduce sodium intake to recommended 1500mg /day   Read nutrition labels on packaged foods to monitor sodium intake   400-600 mg/meal  <200 mg/snack   Avoid processed meats    Adequate protein intake (60-80g protein/day)  Try more plant-based sources of protein- baked tofu recipe  Limit processed meats- deli meats    Switch to low fat dairy products- greek yogurt  Drink nutrition supplement daily as recommended by MD- try over ice and with straw to help with intake   Keep servings of meat to about 3 oz/meal (size of palm)  Education Materials given:   NCM CKD Stages 3-5 Nutrition Therapy  [employer]- balanced meal plan handout   Low Sodium diet handout   Recipes- [email or web address], [email or web address]  Goals/ instructions  Learner/ who was taught:   Patient   Spouse/ partner Santiago Glad)   Level of understanding:  Verbalizes/ demonstrates competency  Demonstrated degree of understanding via:   Teach back Learning barriers:  None  Willingness to learn/ readiness for change:  Eager, change in progress  Monitoring and Evaluation:  Dietary intake, exercise, GFR, and body weight      follow up: january 11th at 11:30am

## 2020-04-18 ENCOUNTER — Telehealth (HOSPITAL_COMMUNITY): Payer: Self-pay | Admitting: Pharmacy Technician

## 2020-04-18 NOTE — Telephone Encounter (Signed)
Placed PAN on David [name] wait list.  ID [id number]  Patient is currently getting the medication from [place]. Does not need assistance at this time.  Charlann Boxer, CPhT

## 2020-04-27 ENCOUNTER — Encounter (HOSPITAL_COMMUNITY): Payer: Self-pay

## 2020-04-27 ENCOUNTER — Telehealth (HOSPITAL_COMMUNITY): Payer: Self-pay | Admitting: *Deleted

## 2020-04-28 NOTE — Telephone Encounter (Signed)
Most recent office notes faxed to [facility] 04/28/20

## 2020-05-01 ENCOUNTER — Encounter (HOSPITAL_COMMUNITY)
Admission: RE | Admit: 2020-05-01 | Discharge: 2020-05-01 | Disposition: A | Discharge status: Home / Self Care | Payer: Medicare HMO | Source: Ambulatory Visit | Attending: Cardiology | Admitting: Cardiology

## 2020-05-01 ENCOUNTER — Ambulatory Visit (INDEPENDENT_AMBULATORY_CARE_PROVIDER_SITE_OTHER): Payer: Medicare HMO

## 2020-05-01 ENCOUNTER — Other Ambulatory Visit: Payer: Self-pay

## 2020-05-01 VITALS — BP 114/62 | HR 75 | Ht 71.5 in | Wt 160.5 lb

## 2020-05-01 DIAGNOSIS — I442 Atrioventricular block, complete: Secondary | ICD-10-CM | POA: Diagnosis not present

## 2020-05-01 DIAGNOSIS — I5022 Chronic systolic (congestive) heart failure: Secondary | ICD-10-CM | POA: Diagnosis present

## 2020-05-01 NOTE — Progress Notes (Signed)
David [name] 79 y.o. male Pulmonary Rehab Orientation Note This patient who was referred to Pulmonary rehab by Dr. Loralie Champagne with the diagnosis of chronic systolic heart failure arrived today in Cardiac and Pulmonary Rehab. He arrived ambulatory with normal gait. He does not carry portable oxygen. Per pt, he uses oxygen never. Color good, skin warm and dry. Patient is oriented to time and place. Patient's medical history, psychosocial health, and medications reviewed. Psychosocial assessment reveals pt lives with their spouse. Pt is currently retired, and has a PHD in Dentist. Pt does not exhibit signs of depression. PHQ2/9 score 0/0. Pt shows good  coping skills with positive outlook . Will continue to monitor and evaluate progress toward psychosocial goal(s) of continued positive outlook while participating in pulmonary rehab. Physical assessment reveals heart rate is normal, breath sounds clear to auscultation, no wheezes, rales, or rhonchi. Grip strength equal, strong. Distal pulses 2+ bilateral posterior tibial pulses present with 2+ bilateral ankle edema with the left ankle larger than the right.  Patient states his ankles are always swollen, but today they may be slightly larger than normal.  He does weigh himself daily and is aware to report to Dr. Aundra Dubin if he gains more than 3 pounds in a day or 5 pounds in a week.  Patient reports he does take medications as prescribed. Patient states he follows a Low Sodium diet. The patient has been trying to gain weight by drinking ensure between meals occassionally to increase his calorie intake... Patient's weight will be monitored closely. Demonstration and practice of PLB using pulse oximeter. Patient able to return demonstration satisfactorily. Safety and hand hygiene in the exercise area reviewed with patient. Patient voices understanding of the information reviewed. Department expectations discussed with patient and achievable goals were set.  The patient shows enthusiasm about attending the program and we look forward to working with this nice gentleman. The patient completed a 6 min walk test today, 05/01/2020 and to begin exercise on 05/09/2020 in the 1100 exercise slot.  [id number]

## 2020-05-01 NOTE — Progress Notes (Signed)
Pulmonary Individual Treatment Plan  Patient Details  Name: David [name] MRN: [id number] Date of Birth: 26-May-1941 Referring Provider:     Pulmonary Rehab Walk Test from 05/01/2020 in [facility]  Referring Provider Loralie Champagne, MD      Initial Encounter Date:    Pulmonary Rehab Walk Test from 05/01/2020 in [facility]  Date 05/08/20      Visit Diagnosis: Heart failure, chronic systolic ([place])  Patient's Home Medications on Admission:   Current Outpatient Medications:  .  acetaminophen (TYLENOL) 500 MG tablet, Take 1,000 mg by mouth every 6 (six) hours as needed for mild pain. , Disp: , Rfl:  .  apixaban (ELIQUIS) 2.5 MG TABS tablet, Take 1 tablet (2.5 mg total) by mouth 2 (two) times daily., Disp: 30 tablet, Rfl: 0 .  ascorbic acid (VITAMIN C) 500 MG tablet, Take 500 mg by mouth daily. Pt states taking 3/week, Disp: , Rfl:  .  Cholecalciferol (VITAMIN D) 50 MCG (2000 UT) tablet, Take 2,000 Units by mouth daily., Disp: , Rfl:  .  Coenzyme Q10 (COQ10) 100 MG CAPS, Take 100 mg by mouth daily. , Disp: , Rfl:  .  Cyanocobalamin (VITAMIN B-12) 2500 MCG SUBL, Place 2,500 mcg under the tongue daily. , Disp: , Rfl:  .  dapagliflozin propanediol (FARXIGA) 10 MG TABS tablet, Take 1 tablet (10 mg total) by mouth daily before breakfast., Disp: 30 tablet, Rfl: 6 .  ferrous sulfate 325 (65 FE) MG tablet, Take 325 mg by mouth 3 (three) times a week., Disp: , Rfl:  .  fluticasone (FLONASE) 50 MCG/ACT nasal spray, Place 1 spray into both nostrils daily as needed for allergies or rhinitis., Disp: , Rfl:  .  magnesium oxide (MAG-OX) 400 MG tablet, Take 400 mg by mouth daily., Disp: , Rfl:  .  metoprolol succinate (TOPROL XL) 25 MG 24 hr tablet, Take 0.5 tablets (12.5 mg total) by mouth at bedtime., Disp: 45 tablet, Rfl: 3 .  Multiple Vitamins-Minerals (PRESERVISION AREDS 2) CAPS, Take 1 capsule by mouth 2 (two) times daily., Disp: , Rfl:  .   nitroGLYCERIN (NITROSTAT) 0.4 MG SL tablet, Place 1 tablet (0.4 mg total) under the tongue every 5 (five) minutes as needed. For chest pain, Disp: 25 tablet, Rfl: 5 .  Polyvinyl Alcohol-Povidone (REFRESH OP), Place 1 drop into both eyes daily as needed (dry eyes)., Disp: , Rfl:  .  potassium chloride SA (KLOR-CON) 20 MEQ tablet, Take 20 mEq by mouth daily., Disp: , Rfl:  .  pramipexole (MIRAPEX) 1 MG tablet, Take 2 mg by mouth at bedtime.  , Disp: , Rfl:  .  simvastatin (ZOCOR) 10 MG tablet, Take 10 mg by mouth every evening. , Disp: , Rfl:  .  spironolactone (ALDACTONE) 25 MG tablet, Take 0.5 tablets (12.5 mg total) by mouth at bedtime., Disp: 90 tablet, Rfl: 3 .  Tafamidis (VYNDAMAX) 61 MG CAPS, Take 61 mg by mouth daily., Disp: 90 capsule, Rfl: 3 .  torsemide (DEMADEX) 20 MG tablet, Take 2 tablets (40 mg total) by mouth daily. Alternate with 40mg  (2 tablets) and 20mg  (1 tablet) every other day, Disp: 60 tablet, Rfl: 3 .  triamcinolone cream (KENALOG) 0.1 %, Apply 1 application topically 2 (two) times daily as needed (itching)., Disp: , Rfl:   Past Medical History: Past Medical History:  Diagnosis Date  . Aortic stenosis    a.mild-mod by echo 06/2017.  . Ascending aorta dilatation (HCC)  a. mildly dilated aortic root and mod dilated ascending aorta by echo 06/2017.  [name] Chronic diastolic CHF (congestive heart failure) ([place])   . CKD (chronic kidney disease), stage III ([place])   . Complete heart block (HCC)    a. s/p STJ pacemaker in 02/2011 - Dr. [name]  . Coronary artery disease    a. LAD atherectomy 1999.  . Gilbert's syndrome   . Hypertension   . Insomnia, unspecified   . Pacemaker-[facility] 07/16/2011  . PAF (paroxysmal atrial fibrillation) ([place])    a. identified on pacemaker interrogation; on Eliquis.  . Peripheral neuropathy   . Pure hypercholesterolemia   . Restless leg syndrome   . Spermatocele    near left testicle 2 cm  . Spinal stenosis, lumbar   . Thrombocytopenia ([place])     Averaging 140,000 since 2008  . Tubular adenoma 09/2012   Dr. Watt Climes repeat 4 years  . Type II or unspecified type diabetes mellitus without mention of complication, not stated as uncontrolled   . 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[employer] [facility] Latest Ref Rng & Units 05/04/2015   Cholestrol 125 - 200 mg/dL 119(L)   LDLCALC <130 mg/dL 49   HDL >=40 mg/dL 58   Trlycerides <150 mg/dL 62      Capillary Blood Glucose: Lab Results  Component Value Date   GLUCAP 114 (H) 05/05/2019   GLUCAP 132 (H) 06/14/2014     Pulmonary Assessment Scores:  Pulmonary Assessment Scores    Row Name 05/01/20 1148         ADL UCSD   ADL Phase Entry     SOB Score total 31       CAT Score   CAT Score 13       mMRC Score   mMRC Score 1           UCSD: Self-administered rating of dyspnea associated with activities of daily living (ADLs) 6-point scale (0 = "not at all" to 5 = "maximal or unable to do because of breathlessness")  Scoring Scores range from 0 to 120.  Minimally important difference is 5 units  CAT: CAT can identify the health impairment of COPD patients and is better correlated with disease progression.  CAT has a scoring range of zero to 40. The CAT score is classified into four groups of low (less than 10), medium (10 - 20), high (21-30) and very high (31-40) based on the impact level of disease on health status. A CAT score over 10 suggests significant symptoms.  A worsening CAT score could be explained by an exacerbation, poor medication adherence, poor inhaler technique, or progression of COPD or comorbid conditions.  CAT MCID is 2 points  mMRC: mMRC (Modified Medical Research Council) Dyspnea Scale is used to assess the degree of baseline functional disability in patients of respiratory disease due to  dyspnea. No minimal important difference is established. A decrease in score of 1 point or greater is considered a positive change.   Pulmonary Function Assessment:  Pulmonary Function Assessment - 05/01/20 1147      Breath   Bilateral Breath Sounds Clear    Shortness of Breath No           Exercise Target Goals: Exercise Program Goal: Individual exercise prescription set using results from  initial 6 min walk test and THRR while considering  patient's activity barriers and safety.   Exercise Prescription Goal: Initial exercise prescription builds to 30-45 minutes a day of aerobic activity, 2-3 days per week.  Home exercise guidelines will be given to patient during program as part of exercise prescription that the participant will acknowledge.  Activity Barriers & Risk Stratification:  Activity Barriers & Cardiac Risk Stratification - 05/01/20 1122      Activity Barriers & Cardiac Risk Stratification   Activity Barriers Arthritis;Deconditioning;Muscular Weakness           6 Minute Walk:  6 Minute Walk    Row Name 05/01/20 1150         6 Minute Walk   Phase Initial     Distance 1215 feet     Walk Time 6 minutes     # of Rest Breaks 0     MPH 2.3     METS 2.6     RPE 14     Perceived Dyspnea  2     VO2 Peak 9     Symptoms Yes (comment)     Comments Pt c/o bilateral leg fatigue, denies pain     Resting HR 74 bpm     Resting BP 114/62     Resting Oxygen Saturation  98 %     Exercise Oxygen Saturation  during 6 min walk 98 %     Max Ex. HR 93 bpm     Max Ex. BP 138/70     2 Minute Post BP 124/70       Interval HR   1 Minute HR 80     2 Minute HR 79     3 Minute HR 86     4 Minute HR 88     5 Minute HR 89     6 Minute HR 93     2 Minute Post HR 71     Interval Heart Rate? Yes       Interval Oxygen   Interval Oxygen? Yes     Baseline Oxygen Saturation % 98 %     1 Minute Oxygen Saturation % 97 %     1 Minute Liters of Oxygen 0 L     2 Minute Oxygen  Saturation % 98 %     2 Minute Liters of Oxygen 0 L     3 Minute Oxygen Saturation % 98 %     3 Minute Liters of Oxygen 0 L     4 Minute Oxygen Saturation % 97 %     4 Minute Liters of Oxygen 0 L     5 Minute Oxygen Saturation % 98 %     5 Minute Liters of Oxygen 0 L     6 Minute Oxygen Saturation % 98 %     6 Minute Liters of Oxygen 0 L     2 Minute Post Oxygen Saturation % 99 %     2 Minute Post Liters of Oxygen 0 L            Oxygen Initial Assessment:  Oxygen Initial Assessment - 05/01/20 1147      Home Oxygen   Home Oxygen Device None    Sleep Oxygen Prescription None    Home Exercise Oxygen Prescription None    Home Resting Oxygen Prescription None      Initial 6 min Walk   Oxygen Used None      Program Oxygen Prescription  Program Oxygen Prescription None      Intervention   Short Term Goals To learn and understand importance of monitoring SPO2 with pulse oximeter and demonstrate accurate use of the pulse oximeter.;To learn and understand importance of maintaining oxygen saturations>88%;To learn and demonstrate proper pursed lip breathing techniques or other breathing techniques.    Long  Term Goals Exhibits compliance with exercise, home and travel O2 prescription;Verbalizes importance of monitoring SPO2 with pulse oximeter and return demonstration;Maintenance of O2 saturations>88%;Exhibits proper breathing techniques, such as pursed lip breathing or other method taught during program session           Oxygen Re-Evaluation:   Oxygen Discharge (Final Oxygen Re-Evaluation):   Initial Exercise Prescription:  Initial Exercise Prescription - 05/01/20 1200      Date of Initial Exercise RX and Referring Provider   Date 05/08/20    Referring Provider Loralie Champagne, MD    Expected Discharge Date 07/07/19      NuStep   Level 2    SPM 75    Minutes 25    METs 2      Prescription Details   Frequency (times per week) 2    Duration Progress to 30 minutes of  continuous aerobic without signs/symptoms of physical distress      Intensity   THRR 40-80% of Max Heartrate 56-111    Ratings of Perceived Exertion 11-13    Perceived Dyspnea 0-4      Progression   Progression Continue progressive overload as per policy without signs/symptoms or physical distress.      Resistance Training   Training Prescription Yes    Weight Orange bands           Perform Capillary Blood Glucose checks as needed.  Exercise Prescription Changes:   Exercise Comments:   Exercise Goals and Review:   Exercise Goals Re-Evaluation :   Discharge Exercise Prescription (Final Exercise Prescription Changes):   Nutrition:  Target Goals: Understanding of nutrition guidelines, daily intake of sodium 1500mg , cholesterol 200mg , calories 30% from fat and 7% or less from saturated fats, daily to have 5 or more servings of fruits and vegetables.  Biometrics:  Pre Biometrics - 05/01/20 1123      Pre Biometrics   Grip Strength 28 kg            Nutrition Therapy Plan and Nutrition Goals:   Nutrition Assessments:  MEDIFICTS Score Key:  ?70 Need to make dietary changes   40-70 Heart Healthy Diet  ? 40 Therapeutic Level Cholesterol Diet   Picture Your Plate Scores:  <35 Unhealthy dietary pattern with much room for improvement.  41-50 Dietary pattern unlikely to meet recommendations for good health and room for improvement.  51-60 More healthful dietary pattern, with some room for improvement.   >60 Healthy dietary pattern, although there may be some specific behaviors that could be improved.    Nutrition Goals Re-Evaluation:   Nutrition Goals Discharge (Final Nutrition Goals Re-Evaluation):   Psychosocial: Target Goals: Acknowledge presence or absence of significant depression and/or stress, maximize coping skills, provide positive support system. Participant is able to verbalize types and ability to use techniques and skills needed for  reducing stress and depression.  Initial Review & Psychosocial Screening:  Initial Psych Review & Screening - 05/01/20 1148      Initial Review   Current issues with None Identified      Family Dynamics   Good Support System? Yes      Barriers   Psychosocial  barriers to participate in program There are no identifiable barriers or psychosocial needs.      Screening Interventions   Interventions Encouraged to exercise           Quality of Life Scores:  Scores of 19 and below usually indicate a poorer quality of life in these areas.  A difference of  2-3 points is a clinically meaningful difference.  A difference of 2-3 points in the total score of the Quality of Life Index has been associated with significant improvement in overall quality of life, self-image, physical symptoms, and general health in studies assessing change in quality of life.  PHQ-9: Recent Review Flowsheet Data    Depression screen [facility] 2/9 05/01/2020 04/11/2020   Decreased Interest 0 0   Down, Depressed, Hopeless 0 0   PHQ - 2 Score 0 0   Altered sleeping 0 -   Tired, decreased energy 0 -   Change in appetite 0 -   Feeling bad or failure about yourself  0 -   Trouble concentrating 0 -   Moving slowly or fidgety/restless 0 -   Suicidal thoughts 0 -   Difficult doing work/chores Not difficult at all -     Interpretation of Total Score  Total Score Depression Severity:  1-4 = Minimal depression, 5-9 = Mild depression, 10-14 = Moderate depression, 15-19 = Moderately severe depression, 20-27 = Severe depression   Psychosocial Evaluation and Intervention:  Psychosocial Evaluation - 05/01/20 1154      Psychosocial Evaluation & Interventions   Interventions Encouraged to exercise with the program and follow exercise prescription    Comments No barriers or psychosocial concerns identified today.    Expected Outcomes For [name] to continue to be free of barriers or psychosocial concerns while participating in  pulmonary rehab.    Continue Psychosocial Services  No Follow up required           Psychosocial Re-Evaluation:   Psychosocial Discharge (Final Psychosocial Re-Evaluation):   Education: Education Goals: Education classes will be provided on a weekly basis, covering required topics. Participant will state understanding/return demonstration of topics presented.  Learning Barriers/Preferences:  Learning Barriers/Preferences - 05/01/20 1156      Learning Barriers/Preferences   Learning Barriers None    Learning Preferences Computer/Internet;Written Material           Education Topics: Risk Factor Reduction:  -Group instruction that is supported by a PowerPoint presentation. Instructor discusses the definition of a risk factor, different risk factors for pulmonary disease, and how the heart and lungs work together.     Nutrition for Pulmonary Patient:  -Group instruction provided by PowerPoint slides, verbal discussion, and written materials to support subject matter. The instructor gives an explanation and review of healthy diet recommendations, which includes a discussion on weight management, recommendations for fruit and vegetable consumption, as well as protein, fluid, caffeine, fiber, sodium, sugar, and alcohol. Tips for eating when patients are short of breath are discussed.   Pursed Lip Breathing:  -Group instruction that is supported by demonstration and informational handouts. Instructor discusses the benefits of pursed lip and diaphragmatic breathing and detailed demonstration on how to preform both.     Oxygen Safety:  -Group instruction provided by PowerPoint, verbal discussion, and written material to support subject matter. There is an overview of "What is Oxygen" and "Why do we need it".  Instructor also reviews how to create a safe environment for oxygen use, the importance of using oxygen as prescribed, and the  risks of noncompliance. There is a brief discussion  on traveling with oxygen and resources the patient may utilize.   Oxygen Equipment:  -Group instruction provided by [facility] Staff utilizing handouts, written materials, and equipment demonstrations.   Signs and Symptoms:  -Group instruction provided by written material and verbal discussion to support subject matter. Warning signs and symptoms of infection, stroke, and heart attack are reviewed and when to call the physician/911 reinforced. Tips for preventing the spread of infection discussed.   Advanced Directives:  -Group instruction provided by verbal instruction and written material to support subject matter. Instructor reviews Advanced Directive laws and proper instruction for filling out document.   Pulmonary Video:  -Group video education that reviews the importance of medication and oxygen compliance, exercise, good nutrition, pulmonary hygiene, and pursed lip and diaphragmatic breathing for the pulmonary patient.   Exercise for the Pulmonary Patient:  -Group instruction that is supported by a PowerPoint presentation. Instructor discusses benefits of exercise, core components of exercise, frequency, duration, and intensity of an exercise routine, importance of utilizing pulse oximetry during exercise, safety while exercising, and options of places to exercise outside of rehab.     Pulmonary Medications:  -Verbally interactive group education provided by instructor with focus on inhaled medications and proper administration.   Anatomy and Physiology of the Respiratory System and Intimacy:  -Group instruction provided by PowerPoint, verbal discussion, and written material to support subject matter. Instructor reviews respiratory cycle and anatomical components of the respiratory system and their functions. Instructor also reviews differences in obstructive and restrictive respiratory diseases with examples of each. Intimacy, Sex, and Sexuality differences are reviewed with a  discussion on how relationships can change when diagnosed with pulmonary disease. Common sexual concerns are reviewed.   MD DAY -A group question and answer session with a medical doctor that allows participants to ask questions that relate to their pulmonary disease state.   OTHER EDUCATION -Group or individual verbal, written, or video instructions that support the educational goals of the pulmonary rehab program.   Holiday Eating Survival Tips:  -Group instruction provided by PowerPoint slides, verbal discussion, and written materials to support subject matter. The instructor gives patients tips, tricks, and techniques to help them not only survive but enjoy the holidays despite the onslaught of food that accompanies the holidays.   Knowledge Questionnaire Score:  Knowledge Questionnaire Score - 05/01/20 1156      Knowledge Questionnaire Score   Pre Score 15/18           Core Components/Risk Factors/Patient Goals at Admission:  Personal Goals and Risk Factors at Admission - 05/01/20 1156      Core Components/Risk Factors/Patient Goals on Admission   Heart Failure Yes    Intervention Provide a combined exercise and nutrition program that is supplemented with education, support and counseling about heart failure. Directed toward relieving symptoms such as shortness of breath, decreased exercise tolerance, and extremity edema.    Expected Outcomes Improve functional capacity of life;Short term: Attendance in program 2-3 days a week with increased exercise capacity. Reported lower sodium intake. Reported increased fruit and vegetable intake. Reports medication compliance.;Short term: Daily weights obtained and reported for increase. Utilizing diuretic protocols set by physician.;Long term: Adoption of self-care skills and reduction of barriers for early signs and symptoms recognition and intervention leading to self-care maintenance.           Core Components/Risk Factors/Patient  Goals Review:   Goals and Risk Factor Review  [place] Name 05/01/20 1157             Core Components/Risk Factors/Patient Goals Review   Personal Goals Review Heart Failure              Core Components/Risk Factors/Patient Goals at Discharge (Final Review):   Goals and Risk Factor Review - 05/01/20 1157      Core Components/Risk Factors/Patient Goals Review   Personal Goals Review Heart Failure           ITP Comments:   Comments:

## 2020-05-03 LAB — CUP PACEART REMOTE DEVICE CHECK
Battery Remaining Longevity: 57 mo
Battery Remaining Percentage: 95.5 %
Battery Voltage: 2.98 V
Brady Statistic AP VP Percent: 89 %
Brady Statistic AP VS Percent: 1 %
Brady Statistic AS VP Percent: 11 %
Brady Statistic AS VS Percent: 1 %
Brady Statistic RA Percent Paced: 90 %
Date Time Interrogation Session: 20211206020013
Implantable Lead Implant Date: 20121031
Implantable Lead Implant Date: 20121031
Implantable Lead Implant Date: 20201209
Implantable Lead Implant Date: 20201209
Implantable Lead Location: 753858
Implantable Lead Location: 753859
Implantable Lead Location: 753860
Implantable Lead Location: 753862
Implantable Lead Model: 3830
Implantable Pulse Generator Implant Date: 20201209
Lead Channel Impedance Value: 290 Ohm
Lead Channel Impedance Value: 350 Ohm
Lead Channel Impedance Value: 430 Ohm
Lead Channel Pacing Threshold Amplitude: 0.75 V
Lead Channel Pacing Threshold Amplitude: 0.75 V
Lead Channel Pacing Threshold Amplitude: 0.75 V
Lead Channel Pacing Threshold Pulse Width: 0.5 ms
Lead Channel Pacing Threshold Pulse Width: 0.8 ms
Lead Channel Pacing Threshold Pulse Width: 1 ms
Lead Channel Sensing Intrinsic Amplitude: 1.6 mV
Lead Channel Setting Pacing Amplitude: 2 V
Lead Channel Setting Pacing Amplitude: 2 V
Lead Channel Setting Pacing Amplitude: 2.5 V
Lead Channel Setting Pacing Pulse Width: 0.8 ms
Lead Channel Setting Pacing Pulse Width: 1 ms
Lead Channel Setting Sensing Sensitivity: 4 mV
Pulse Gen Model: 3222
Pulse Gen Serial Number: 9138824

## 2020-05-08 ENCOUNTER — Ambulatory Visit (INDEPENDENT_AMBULATORY_CARE_PROVIDER_SITE_OTHER): Payer: Medicare HMO

## 2020-05-08 DIAGNOSIS — I5022 Chronic systolic (congestive) heart failure: Secondary | ICD-10-CM

## 2020-05-08 DIAGNOSIS — Z950 Presence of cardiac pacemaker: Secondary | ICD-10-CM | POA: Diagnosis not present

## 2020-05-09 ENCOUNTER — Other Ambulatory Visit: Payer: Self-pay

## 2020-05-09 ENCOUNTER — Encounter (HOSPITAL_COMMUNITY)
Admission: RE | Admit: 2020-05-09 | Discharge: 2020-05-09 | Disposition: A | Discharge status: Home / Self Care | Payer: Medicare HMO | Source: Ambulatory Visit | Attending: Cardiology | Admitting: Cardiology

## 2020-05-09 VITALS — Wt 160.1 lb

## 2020-05-09 DIAGNOSIS — I5022 Chronic systolic (congestive) heart failure: Secondary | ICD-10-CM

## 2020-05-09 NOTE — Progress Notes (Signed)
Pulmonary Individual Treatment Plan  Patient Details  Name: David [name] MRN: [id number] Date of Birth: 06/24/1940 Referring Provider:   April Manson Pulmonary Rehab Walk Test from 05/01/2020 in [facility]  Referring Provider Loralie Champagne, MD      Initial Encounter Date:  Flowsheet Row Pulmonary Rehab Walk Test from 05/01/2020 in [facility]  Date 05/08/20      Visit Diagnosis: Heart failure, chronic systolic ([place])  Patient's Home Medications on Admission:   Current Outpatient Medications:    acetaminophen (TYLENOL) 500 MG tablet, Take 1,000 mg by mouth every 6 (six) hours as needed for mild pain. , Disp: , Rfl:    apixaban (ELIQUIS) 2.5 MG TABS tablet, Take 1 tablet (2.5 mg total) by mouth 2 (two) times daily., Disp: 30 tablet, Rfl: 0   ascorbic acid (VITAMIN C) 500 MG tablet, Take 500 mg by mouth daily. Pt states taking 3/week, Disp: , Rfl:    Cholecalciferol (VITAMIN D) 50 MCG (2000 UT) tablet, Take 2,000 Units by mouth daily., Disp: , Rfl:    Coenzyme Q10 (COQ10) 100 MG CAPS, Take 100 mg by mouth daily. , Disp: , Rfl:    Cyanocobalamin (VITAMIN B-12) 2500 MCG SUBL, Place 2,500 mcg under the tongue daily. , Disp: , Rfl:    dapagliflozin propanediol (FARXIGA) 10 MG TABS tablet, Take 1 tablet (10 mg total) by mouth daily before breakfast., Disp: 30 tablet, Rfl: 6   ferrous sulfate 325 (65 FE) MG tablet, Take 325 mg by mouth 3 (three) times a week., Disp: , Rfl:    fluticasone (FLONASE) 50 MCG/ACT nasal spray, Place 1 spray into both nostrils daily as needed for allergies or rhinitis., Disp: , Rfl:    magnesium oxide (MAG-OX) 400 MG tablet, Take 400 mg by mouth daily., Disp: , Rfl:    metoprolol succinate (TOPROL XL) 25 MG 24 hr tablet, Take 0.5 tablets (12.5 mg total) by mouth at bedtime., Disp: 45 tablet, Rfl: 3   Multiple Vitamins-Minerals (PRESERVISION AREDS 2) CAPS, Take 1 capsule by mouth 2 (two) times  daily., Disp: , Rfl:    nitroGLYCERIN (NITROSTAT) 0.4 MG SL tablet, Place 1 tablet (0.4 mg total) under the tongue every 5 (five) minutes as needed. For chest pain, Disp: 25 tablet, Rfl: 5   Polyvinyl Alcohol-Povidone (REFRESH OP), Place 1 drop into both eyes daily as needed (dry eyes)., Disp: , Rfl:    potassium chloride SA (KLOR-CON) 20 MEQ tablet, Take 20 mEq by mouth daily., Disp: , Rfl:    pramipexole (MIRAPEX) 1 MG tablet, Take 2 mg by mouth at bedtime.  , Disp: , Rfl:    simvastatin (ZOCOR) 10 MG tablet, Take 10 mg by mouth every evening. , Disp: , Rfl:    spironolactone (ALDACTONE) 25 MG tablet, Take 0.5 tablets (12.5 mg total) by mouth at bedtime., Disp: 90 tablet, Rfl: 3   Tafamidis (VYNDAMAX) 61 MG CAPS, Take 61 mg by mouth daily., Disp: 90 capsule, Rfl: 3   torsemide (DEMADEX) 20 MG tablet, Take 2 tablets (40 mg total) by mouth daily. Alternate with 3m (2 tablets) and 276m(1 tablet) every other day, Disp: 60 tablet, Rfl: 3   triamcinolone cream (KENALOG) 0.1 %, Apply 1 application topically 2 (two) times daily as needed (itching)., Disp: , Rfl:   Past Medical History: Past Medical History:  Diagnosis Date   Aortic stenosis    a.mild-mod by echo 06/2017.   Ascending aorta dilatation (HCC)  a. mildly dilated aortic root and mod dilated ascending aorta by echo 06/2017.   Chronic diastolic CHF (congestive heart failure) (HCC)    CKD (chronic kidney disease), stage III (HCC)    Complete heart block (HCC)    a. s/p STJ pacemaker in 02/2011 - Dr. [name]   Coronary artery disease    a. LAD atherectomy 1999.   Gilbert's syndrome    Hypertension    Insomnia, unspecified    Pacemaker-[facility] 07/16/2011   PAF (paroxysmal atrial fibrillation) ([place])    a. identified on pacemaker interrogation; on Eliquis.   Peripheral neuropathy    Pure hypercholesterolemia    Restless leg syndrome    Spermatocele    near left testicle 2 cm   Spinal stenosis, lumbar     Thrombocytopenia ([place])    Averaging 140,000 since 2008   Tubular adenoma 09/2012   Dr. Watt Climes repeat 4 years   Type II or unspecified type diabetes mellitus without mention of complication, not stated as uncontrolled    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[facility] Cardiac and Pulmonary Rehab Latest Ref Rng & Units 05/04/2015   Cholestrol 125 - 200 mg/dL 119(L)   LDLCALC <130 mg/dL 49   HDL >=40 mg/dL 58   Trlycerides <150 mg/dL 62      Capillary Blood Glucose: Lab Results  Component Value Date   GLUCAP 114 (H) 05/05/2019   GLUCAP 132 (H) 06/14/2014     Pulmonary Assessment Scores:  Pulmonary Assessment Scores    Row Name 05/01/20 1148         ADL UCSD   ADL Phase Entry     SOB Score total 31           CAT Score   CAT Score 13           mMRC Score   mMRC Score 1           UCSD: Self-administered rating of dyspnea associated with activities of daily living (ADLs) 6-point scale (0 = "not at all" to 5 = "maximal or unable to do because of breathlessness")  Scoring Scores range from 0 to 120.  Minimally important difference is 5 units  CAT: CAT can identify the health impairment of COPD patients and is better correlated with disease progression.  CAT has a scoring range of zero to 40. The CAT score is classified into four groups of low (less than 10), medium (10 - 20), high (21-30) and very high (31-40) based on the impact level of disease on health status. A CAT score over 10 suggests significant symptoms.  A worsening CAT score could be explained by an exacerbation, poor medication adherence, poor inhaler technique, or progression of COPD or comorbid conditions.  CAT MCID is 2 points  mMRC: mMRC (Modified Medical Research Council) Dyspnea Scale is used to assess the degree of baseline functional disability in patients  of respiratory disease due to dyspnea. No minimal important difference is established. A decrease in score of 1 point or greater is considered a positive change.   Pulmonary Function Assessment:  Pulmonary Function Assessment - 05/01/20 1147      Breath   Bilateral Breath Sounds Clear    Shortness of Breath No           Exercise Target Goals: Exercise Program  Goal: Individual exercise prescription set using results from initial 6 min walk test and THRR while considering  patients activity barriers and safety.   Exercise Prescription Goal: Initial exercise prescription builds to 30-45 minutes a day of aerobic activity, 2-3 days per week.  Home exercise guidelines will be given to patient during program as part of exercise prescription that the participant will acknowledge.  Activity Barriers & Risk Stratification:  Activity Barriers & Cardiac Risk Stratification - 05/01/20 1122      Activity Barriers & Cardiac Risk Stratification   Activity Barriers Arthritis;Deconditioning;Muscular Weakness           6 Minute Walk:  6 Minute Walk    Row Name 05/01/20 1150         6 Minute Walk   Phase Initial     Distance 1215 feet     Walk Time 6 minutes     # of Rest Breaks 0     MPH 2.3     METS 2.6     RPE 14     Perceived Dyspnea  2     VO2 Peak 9     Symptoms Yes (comment)     Comments Pt c/o bilateral leg fatigue, denies pain     Resting HR 74 bpm     Resting BP 114/62     Resting Oxygen Saturation  98 %     Exercise Oxygen Saturation  during 6 min walk 98 %     Max Ex. HR 93 bpm     Max Ex. BP 138/70     2 Minute Post BP 124/70           Interval HR   1 Minute HR 80     2 Minute HR 79     3 Minute HR 86     4 Minute HR 88     5 Minute HR 89     6 Minute HR 93     2 Minute Post HR 71     Interval Heart Rate? Yes           Interval Oxygen   Interval Oxygen? Yes     Baseline Oxygen Saturation % 98 %     1 Minute Oxygen Saturation % 97 %     1 Minute  Liters of Oxygen 0 L     2 Minute Oxygen Saturation % 98 %     2 Minute Liters of Oxygen 0 L     3 Minute Oxygen Saturation % 98 %     3 Minute Liters of Oxygen 0 L     4 Minute Oxygen Saturation % 97 %     4 Minute Liters of Oxygen 0 L     5 Minute Oxygen Saturation % 98 %     5 Minute Liters of Oxygen 0 L     6 Minute Oxygen Saturation % 98 %     6 Minute Liters of Oxygen 0 L     2 Minute Post Oxygen Saturation % 99 %     2 Minute Post Liters of Oxygen 0 L            Oxygen Initial Assessment:  Oxygen Initial Assessment - 05/01/20 1147      Home Oxygen   Home Oxygen Device None    Sleep Oxygen Prescription None    Home Exercise Oxygen Prescription None    Home Resting Oxygen Prescription None      Initial  6 min Walk   Oxygen Used None      Program Oxygen Prescription   Program Oxygen Prescription None      Intervention   Short Term Goals To learn and understand importance of monitoring SPO2 with pulse oximeter and demonstrate accurate use of the pulse oximeter.;To learn and understand importance of maintaining oxygen saturations>88%;To learn and demonstrate proper pursed lip breathing techniques or other breathing techniques.    Long  Term Goals Exhibits compliance with exercise, home and travel O2 prescription;Verbalizes importance of monitoring SPO2 with pulse oximeter and return demonstration;Maintenance of O2 saturations>88%;Exhibits proper breathing techniques, such as pursed lip breathing or other method taught during program session           Oxygen Re-Evaluation:  Oxygen Re-Evaluation    Row Name 05/09/20 0852             Program Oxygen Prescription   Program Oxygen Prescription None               Home Oxygen   Home Oxygen Device None       Sleep Oxygen Prescription None       Home Exercise Oxygen Prescription None       Home Resting Oxygen Prescription None               Goals/Expected Outcomes   Short Term Goals To learn and understand  importance of monitoring SPO2 with pulse oximeter and demonstrate accurate use of the pulse oximeter.;To learn and understand importance of maintaining oxygen saturations>88%;To learn and demonstrate proper pursed lip breathing techniques or other breathing techniques.       Long  Term Goals Exhibits compliance with exercise, home and travel O2 prescription;Verbalizes importance of monitoring SPO2 with pulse oximeter and return demonstration;Maintenance of O2 saturations>88%;Exhibits proper breathing techniques, such as pursed lip breathing or other method taught during program session       Goals/Expected Outcomes Compliance and understanding of oxygen saturation and pursed lip breathing              Oxygen Discharge (Final Oxygen Re-Evaluation):  Oxygen Re-Evaluation - 05/09/20 0852      Program Oxygen Prescription   Program Oxygen Prescription None      Home Oxygen   Home Oxygen Device None    Sleep Oxygen Prescription None    Home Exercise Oxygen Prescription None    Home Resting Oxygen Prescription None      Goals/Expected Outcomes   Short Term Goals To learn and understand importance of monitoring SPO2 with pulse oximeter and demonstrate accurate use of the pulse oximeter.;To learn and understand importance of maintaining oxygen saturations>88%;To learn and demonstrate proper pursed lip breathing techniques or other breathing techniques.    Long  Term Goals Exhibits compliance with exercise, home and travel O2 prescription;Verbalizes importance of monitoring SPO2 with pulse oximeter and return demonstration;Maintenance of O2 saturations>88%;Exhibits proper breathing techniques, such as pursed lip breathing or other method taught during program session    Goals/Expected Outcomes Compliance and understanding of oxygen saturation and pursed lip breathing           Initial Exercise Prescription:  Initial Exercise Prescription - 05/01/20 1200      Date of Initial Exercise RX and  Referring Provider   Date 05/08/20    Referring Provider Loralie Champagne, MD    Expected Discharge Date 07/07/19      NuStep   Level 2    SPM 75    Minutes 25    METs  2      Prescription Details   Frequency (times per week) 2    Duration Progress to 30 minutes of continuous aerobic without signs/symptoms of physical distress      Intensity   THRR 40-80% of Max Heartrate 56-111    Ratings of Perceived Exertion 11-13    Perceived Dyspnea 0-4      Progression   Progression Continue progressive overload as per policy without signs/symptoms or physical distress.      Resistance Training   Training Prescription Yes    Weight Orange bands           Perform Capillary Blood Glucose checks as needed.  Exercise Prescription Changes:   Exercise Comments:   Exercise Goals and Review:   Exercise Goals Re-Evaluation :  Exercise Goals Re-Evaluation    Row Name 05/09/20 0848             Exercise Goal Re-Evaluation   Exercise Goals Review Increase Physical Activity;Increase Strength and Stamina;Able to understand and use rate of perceived exertion (RPE) scale;Able to understand and use Dyspnea scale;Knowledge and understanding of Target Heart Rate Range (THRR);Understanding of Exercise Prescription       Comments Pt is starting rehab today. Too early to making in comments on exercise. Will monitor and progress as able.       Expected Outcomes Through exercise at rehab and home the patient will decrease shortness of breath with daily activities and feel confident in carrying out an exercise regimn at home.              Discharge Exercise Prescription (Final Exercise Prescription Changes):   Nutrition:  Target Goals: Understanding of nutrition guidelines, daily intake of sodium <1557m, cholesterol <2063m calories 30% from fat and 7% or less from saturated fats, daily to have 5 or more servings of fruits and vegetables.  Biometrics:  Pre Biometrics - 05/01/20 1123       Pre Biometrics   Grip Strength 28 kg            Nutrition Therapy Plan and Nutrition Goals:   Nutrition Assessments:  MEDIFICTS Score Key:  ?70 Need to make dietary changes   40-70 Heart Healthy Diet  ? 40 Therapeutic Level Cholesterol Diet   Picture Your Plate Scores:  <4<28nhealthy dietary pattern with much room for improvement.  41-50 Dietary pattern unlikely to meet recommendations for good health and room for improvement.  51-60 More healthful dietary pattern, with some room for improvement.   >60 Healthy dietary pattern, although there may be some specific behaviors that could be improved.    Nutrition Goals Re-Evaluation:   Nutrition Goals Discharge (Final Nutrition Goals Re-Evaluation):   Psychosocial: Target Goals: Acknowledge presence or absence of significant depression and/or stress, maximize coping skills, provide positive support system. Participant is able to verbalize types and ability to use techniques and skills needed for reducing stress and depression.  Initial Review & Psychosocial Screening:  Initial Psych Review & Screening - 05/01/20 1148      Initial Review   Current issues with None Identified      Family Dynamics   Good Support System? Yes      Barriers   Psychosocial barriers to participate in program There are no identifiable barriers or psychosocial needs.      Screening Interventions   Interventions Encouraged to exercise           Quality of Life Scores:  Scores of 19 and below usually indicate a poorer  quality of life in these areas.  A difference of  2-3 points is a clinically meaningful difference.  A difference of 2-3 points in the total score of the Quality of Life Index has been associated with significant improvement in overall quality of life, self-image, physical symptoms, and general health in studies assessing change in quality of life.  PHQ-9: Recent Review Flowsheet Data    Depression screen [facility] 2/9  05/01/2020 04/11/2020   Decreased Interest 0 0   Down, Depressed, Hopeless 0 0   PHQ - 2 Score 0 0   Altered sleeping 0 -   Tired, decreased energy 0 -   Change in appetite 0 -   Feeling bad or failure about yourself  0 -   Trouble concentrating 0 -   Moving slowly or fidgety/restless 0 -   Suicidal thoughts 0 -   Difficult doing work/chores Not difficult at all -     Interpretation of Total Score  Total Score Depression Severity:  1-4 = Minimal depression, 5-9 = Mild depression, 10-14 = Moderate depression, 15-19 = Moderately severe depression, 20-27 = Severe depression   Psychosocial Evaluation and Intervention:  Psychosocial Evaluation - 05/01/20 1154      Psychosocial Evaluation & Interventions   Interventions Encouraged to exercise with the program and follow exercise prescription    Comments No barriers or psychosocial concerns identified today.    Expected Outcomes For David [name] to continue to be free of barriers or psychosocial concerns while participating in pulmonary rehab.    Continue Psychosocial Services  No Follow up required           Psychosocial Re-Evaluation:  Psychosocial Re-Evaluation    [place] Name 05/09/20 0903             Psychosocial Re-Evaluation   Current issues with None Identified       Comments No concerns identified.       Expected Outcomes For David [name] to be free of psychosocial concerns while participating in pulmonary rehab.       Interventions Encouraged to attend Pulmonary Rehabilitation for the exercise       Continue Psychosocial Services  No Follow up required              Psychosocial Discharge (Final Psychosocial Re-Evaluation):  Psychosocial Re-Evaluation - 05/09/20 0903      Psychosocial Re-Evaluation   Current issues with None Identified    Comments No concerns identified.    Expected Outcomes For David [name] to be free of psychosocial concerns while participating in pulmonary rehab.    Interventions Encouraged to attend Pulmonary  Rehabilitation for the exercise    Continue Psychosocial Services  No Follow up required           Education: Education Goals: Education classes will be provided on a weekly basis, covering required topics. Participant will state understanding/return demonstration of topics presented.  Learning Barriers/Preferences:  Learning Barriers/Preferences - 05/01/20 1156      Learning Barriers/Preferences   Learning Barriers None    Learning Preferences Computer/Internet;Written Material           Education Topics: Risk Factor Reduction:  -Group instruction that is supported by a PowerPoint presentation. Instructor discusses the definition of a risk factor, different risk factors for pulmonary disease, and how the heart and lungs work together.     Nutrition for Pulmonary Patient:  -Group instruction provided by PowerPoint slides, verbal discussion, and written materials to support subject matter. The instructor gives an explanation and review of  healthy diet recommendations, which includes a discussion on weight management, recommendations for fruit and vegetable consumption, as well as protein, fluid, caffeine, fiber, sodium, sugar, and alcohol. Tips for eating when patients are short of breath are discussed.   Pursed Lip Breathing:  -Group instruction that is supported by demonstration and informational handouts. Instructor discusses the benefits of pursed lip and diaphragmatic breathing and detailed demonstration on how to preform both.     Oxygen Safety:  -Group instruction provided by PowerPoint, verbal discussion, and written material to support subject matter. There is an overview of What is Oxygen and Why do we need it.  Instructor also reviews how to create a safe environment for oxygen use, the importance of using oxygen as prescribed, and the risks of noncompliance. There is a brief discussion on traveling with oxygen and resources the patient may utilize.   Oxygen  Equipment:  -Group instruction provided by [facility] Staff utilizing handouts, written materials, and equipment demonstrations.   Signs and Symptoms:  -Group instruction provided by written material and verbal discussion to support subject matter. Warning signs and symptoms of infection, stroke, and heart attack are reviewed and when to call the physician/911 reinforced. Tips for preventing the spread of infection discussed.   Advanced Directives:  -Group instruction provided by verbal instruction and written material to support subject matter. Instructor reviews Advanced Directive laws and proper instruction for filling out document.   Pulmonary Video:  -Group video education that reviews the importance of medication and oxygen compliance, exercise, good nutrition, pulmonary hygiene, and pursed lip and diaphragmatic breathing for the pulmonary patient.   Exercise for the Pulmonary Patient:  -Group instruction that is supported by a PowerPoint presentation. Instructor discusses benefits of exercise, core components of exercise, frequency, duration, and intensity of an exercise routine, importance of utilizing pulse oximetry during exercise, safety while exercising, and options of places to exercise outside of rehab.     Pulmonary Medications:  -Verbally interactive group education provided by instructor with focus on inhaled medications and proper administration.   Anatomy and Physiology of the Respiratory System and Intimacy:  -Group instruction provided by PowerPoint, verbal discussion, and written material to support subject matter. Instructor reviews respiratory cycle and anatomical components of the respiratory system and their functions. Instructor also reviews differences in obstructive and restrictive respiratory diseases with examples of each. Intimacy, Sex, and Sexuality differences are reviewed with a discussion on how relationships can change when diagnosed with pulmonary  disease. Common sexual concerns are reviewed.   MD DAY -A group question and answer session with a medical doctor that allows participants to ask questions that relate to their pulmonary disease state.   OTHER EDUCATION -Group or individual verbal, written, or video instructions that support the educational goals of the pulmonary rehab program.   Holiday Eating Survival Tips:  -Group instruction provided by PowerPoint slides, verbal discussion, and written materials to support subject matter. The instructor gives patients tips, tricks, and techniques to help them not only survive but enjoy the holidays despite the onslaught of food that accompanies the holidays.   Knowledge Questionnaire Score:  Knowledge Questionnaire Score - 05/01/20 1156      Knowledge Questionnaire Score   Pre Score 15/18           Core Components/Risk Factors/Patient Goals at Admission:  Personal Goals and Risk Factors at Admission - 05/01/20 1156      Core Components/Risk Factors/Patient Goals on Admission   Heart Failure Yes    Intervention  Provide a combined exercise and nutrition program that is supplemented with education, support and counseling about heart failure. Directed toward relieving symptoms such as shortness of breath, decreased exercise tolerance, and extremity edema.    Expected Outcomes Improve functional capacity of life;Short term: Attendance in program 2-3 days a week with increased exercise capacity. Reported lower sodium intake. Reported increased fruit and vegetable intake. Reports medication compliance.;Short term: Daily weights obtained and reported for increase. Utilizing diuretic protocols set by physician.;Long term: Adoption of self-care skills and reduction of barriers for early signs and symptoms recognition and intervention leading to self-care maintenance.           Core Components/Risk Factors/Patient Goals Review:   Goals and Risk Factor Review    Row Name 05/01/20 1157  05/09/20 0904           Core Components/Risk Factors/Patient Goals Review   Personal Goals Review Heart Failure Heart Failure      Review -- Patient starts exercising today, not able to have met any goals.      Expected Outcomes -- For David [name] to increase his strength, stamina, and endurance in the next full 30 days and maintain his heart failure.             Core Components/Risk Factors/Patient Goals at Discharge (Final Review):   Goals and Risk Factor Review - 05/09/20 0904      Core Components/Risk Factors/Patient Goals Review   Personal Goals Review Heart Failure    Review Patient starts exercising today, not able to have met any goals.    Expected Outcomes For David [name] to increase his strength, stamina, and endurance in the next full 30 days and maintain his heart failure.           ITP Comments:   Comments: ITP REVIEW Pt is making expected progress toward pulmonary rehab goals after completing 0 sessions. Recommend continued exercise, life style modification, education, and utilization of breathing techniques to increase stamina and strength and decrease shortness of breath with exertion.

## 2020-05-09 NOTE — Progress Notes (Signed)
Daily Session Note  Patient Details  Name: David [name] MRN: [id number] Date of Birth: 07-Apr-1941 Referring Provider:   April Manson Pul[facility] Rehab Walk Test from 05/01/2020 in [place]  Referring Provider Loralie Champagne, MD      Encounter Date: 05/09/2020  Check In:  Session Check In - 05/09/20 1128      Check-In   Supervising physician immediately available to respond to emergencies [facility] immediately available    Physician(s) Dr. Cyndia Skeeters    Location [facility]    Staff Present Rosebud Poles, RN, BSN;Lisa Ysidro Evert, RN;Brighton Delio Hassell Done, MS, ACSM-CEP, Exercise Physiologist    Virtual Visit No    Medication changes reported     No    Fall or balance concerns reported    No    Tobacco Cessation No Change    Warm-up and Cool-down Performed on first and last piece of equipment    Resistance Training Performed Yes    VAD Patient? No    PAD/SET Patient? No      Pain Assessment   Currently in Pain? No/denies    Multiple Pain Sites No           Capillary Blood Glucose: No results found for this or any previous visit (from the past 24 hour(s)).   Exercise Prescription Changes - 05/09/20 1100      Response to Exercise   Blood Pressure (Admit) 104/60    Blood Pressure (Exercise) 110/54    Blood Pressure (Exit) 108/60    Heart Rate (Admit) 70 bpm    Heart Rate (Exercise) 94 bpm    Heart Rate (Exit) 70 bpm    Oxygen Saturation (Admit) 100 %    Oxygen Saturation (Exercise) 100 %    Oxygen Saturation (Exit) 98 %    Rating of Perceived Exertion (Exercise) 11    Perceived Dyspnea (Exercise) 1    Duration Progress to 45 minutes of aerobic exercise without signs/symptoms of physical distress    Intensity Other (comment)   40-80% HR Max     Progression   Progression Continue to progress workloads to maintain intensity without signs/symptoms of physical distress.      Resistance Training   Training Prescription Yes     Weight Orange bands    Reps 10-15    Time 10 Minutes      NuStep   Level 3    SPM 80    Minutes 30    METs 2.4           Social History   Tobacco Use  Smoking Status Former Smoker  Smokeless Tobacco Never Used    Goals Met:  Proper associated with RPD/PD & O2 Sat Exercise tolerated well Strength training completed today  Goals Unmet:  Not Applicable  Comments: Service time is from 1045 to 1150 Patient completed first day of exercise with no complaints or concerns.     Dr. Fransico Him is Medical Director for Cardiac Rehab at [facility].

## 2020-05-10 NOTE — Progress Notes (Signed)
EPIC Encounter for ICM Monitoring  Patient Name: David [name] is a 79 y.o. male Date: 05/10/2020 Primary Care Physican: Lajean Manes, MD Primary Cardiologist:[place] Electrophysiologist:Taylor Bi-V Pacing:>99% 12/15/2021Weight:155lbs  AT/AF Burden:<1% (taking Eliquis)   Spoke with patient and reports feeling well at this time.  Denies fluid symptoms.    CorvueThoracic impedancenormal but was suggesting possible fluid accumulation from 11/19 - 12/1.  Prescribed:   Torsemide20mg  Take 2 tablets (40 mg total) by mouth daily. Alternate with 40mg  (2 tablets) and 20mg  (1 tablet) every other day  Potassium 20 mEq take1tablet (25mEq total) by mouth daily  Spironolactone 25 mg take0.5tablet (12.5 mg total)at bedtime  Labs: 03/21/2020 Creatinine 1.61, BUN 49, Potassium 4.0, Sodium 136, GFR 40 01/05/2020 Creatinine 1.58, BUN 43, Potassium 3.9, Sodium 137, GFR 40-47 12/14/2019 Creatinine1.58, BUN42, Potassium4.4, Sodium136, OXB35-32 09/14/2019 Creatinine1.53, BUN40, Potassium4.7, [id number], HDQ22-29  07/28/2019 Creatinine1.81, BUN53, Potassium4.0, Sodium135, GFR34-40  07/19/2019 Creatinine1.89, BUN44, Potassium4.1, [id number], RDE08-14 A complete set of results can be found in Results Review.  Recommendations:No changes and encouraged to call if experiencing any fluid symptoms.  Follow-up plan: ICM clinic phone appointment on2/05/2020. 91 day device clinic remote transmission3/11/2020.  EP/Cardiology Office Visits: Recall for 08/11/2020 with Dr Lovena Le.   Copy of ICM check sent to Dr.Taylor.  3 month ICM trend: 05/08/2020    1 Year ICM trend:       [name], RN 05/10/2020 12:53 PM

## 2020-05-11 ENCOUNTER — Encounter (HOSPITAL_COMMUNITY)
Admission: RE | Admit: 2020-05-11 | Discharge: 2020-05-11 | Disposition: A | Discharge status: Home / Self Care | Payer: Medicare HMO | Source: Ambulatory Visit | Attending: Cardiology | Admitting: Cardiology

## 2020-05-11 ENCOUNTER — Other Ambulatory Visit: Payer: Self-pay

## 2020-05-11 DIAGNOSIS — I5022 Chronic systolic (congestive) heart failure: Secondary | ICD-10-CM

## 2020-05-11 NOTE — Progress Notes (Signed)
Remote pacemaker transmission.   

## 2020-05-11 NOTE — Progress Notes (Signed)
Daily Session Note  Patient Details  Name: David [name] MRN: [id number] Date of Birth: November 30, 1940 Referring Provider:   April Manson Pulmonary Rehab Walk Test from 05/01/2020 in [place]  Referring Provider Loralie Champagne, MD      Encounter Date: 05/11/2020  Check In:  Session Check In - 05/11/20 1108      Check-In   Supervising physician immediately available to respond to emergencies [facility] immediately available    Physician(s) Dr. Algis Liming    [facility]    Staff Present Rosebud Poles, RN, BSN;Lisa Ysidro Evert, RN;Jessica Hassell Done, MS, ACSM-CEP, Exercise Physiologist    Virtual Visit No    Medication changes reported     No    Fall or balance concerns reported    No    Tobacco Cessation No Change    Warm-up and Cool-down Performed on first and last piece of equipment    Resistance Training Performed Yes    VAD Patient? No    PAD/SET Patient? No      Pain Assessment   Currently in Pain? No/denies    Multiple Pain Sites No           Capillary Blood Glucose: No results found for this or any previous visit (from the past 24 hour(s)).    Social History   Tobacco Use  Smoking Status Former Smoker  Smokeless Tobacco Never Used    Goals Met:  Proper associated with RPD/PD & O2 Sat Exercise tolerated well Strength training completed today  Goals Unmet:  Not Applicable  Comments: Service time is from 1045 to 1140    Dr. Fransico Him is Medical Director for Cardiac Rehab at [facility].

## 2020-05-16 ENCOUNTER — Other Ambulatory Visit: Payer: Self-pay

## 2020-05-16 ENCOUNTER — Encounter (HOSPITAL_COMMUNITY)
Admission: RE | Admit: 2020-05-16 | Discharge: 2020-05-16 | Disposition: A | Discharge status: Home / Self Care | Payer: Medicare HMO | Source: Ambulatory Visit | Attending: Cardiology | Admitting: Cardiology

## 2020-05-16 DIAGNOSIS — I5022 Chronic systolic (congestive) heart failure: Secondary | ICD-10-CM | POA: Diagnosis not present

## 2020-05-16 NOTE — Progress Notes (Signed)
Daily Session Note  Patient Details  Name: David [name] MRN: [id number] Date of Birth: 03-10-1941 Referring Provider:   April Manson Pulmonary Rehab Walk Test from 05/01/2020 in [place]  Referring Provider Loralie Champagne, MD      Encounter Date: 05/16/2020  Check In:  Session Check In - 05/16/20 1103      Check-In   Supervising physician immediately available to respond to emergencies [facility] immediately available    Physician(s) Dr. Cruzita Lederer    [facility]    Staff Present Rosebud Poles, RN, BSN;Lisa Ysidro Evert, RN;Jessica Hassell Done, MS, ACSM-CEP, Exercise Physiologist    Virtual Visit No    Medication changes reported     No    Fall or balance concerns reported    No    Tobacco Cessation No Change    Warm-up and Cool-down Performed on first and last piece of equipment    Resistance Training Performed Yes    VAD Patient? No    PAD/SET Patient? No      Pain Assessment   Currently in Pain? No/denies    Multiple Pain Sites No           Capillary Blood Glucose: No results found for this or any previous visit (from the past 24 hour(s)).    Social History   Tobacco Use  Smoking Status Former Smoker  Smokeless Tobacco Never Used    Goals Met:  Proper associated with RPD/PD & O2 Sat Exercise tolerated well Strength training completed today  Goals Unmet:  Not Applicable  Comments: Service time is from 1045 to Loyal    Dr. Fransico Him is Medical Director for Cardiac Rehab at [facility].

## 2020-05-17 ENCOUNTER — Telehealth (HOSPITAL_COMMUNITY): Payer: Self-pay | Admitting: Pharmacy Technician

## 2020-05-17 NOTE — Telephone Encounter (Signed)
Advanced Heart Failure Patient Advocate Encounter  Prior Authorization for David [name] has been approved.    Effective dates: 05/17/20 through 05/17/21  Charlann Boxer, CPhT

## 2020-05-17 NOTE — Telephone Encounter (Signed)
Patient Advocate Encounter   Received notification from [employer] that prior authorization for Vyndamax is required.   PA submitted on CoverMyMeds Key [id number] Status is pending   Will continue to follow.

## 2020-05-18 ENCOUNTER — Encounter (HOSPITAL_COMMUNITY): Payer: Medicare HMO

## 2020-05-23 ENCOUNTER — Encounter (HOSPITAL_COMMUNITY): Payer: Medicare HMO

## 2020-05-25 ENCOUNTER — Encounter (HOSPITAL_COMMUNITY): Payer: Medicare HMO

## 2020-05-29 ENCOUNTER — Telehealth: Payer: Self-pay | Admitting: Neurology

## 2020-05-29 NOTE — Telephone Encounter (Signed)
Called the patient back. He only asked if ok to bring his wife. Advised that was fine and they will be here at 8 am for his 8:30 apt.

## 2020-05-29 NOTE — Telephone Encounter (Signed)
Pt. is asking for RN to give him a call back regarding appt. tomorrow.

## 2020-05-30 ENCOUNTER — Other Ambulatory Visit: Payer: Self-pay

## 2020-05-30 ENCOUNTER — Encounter: Payer: Self-pay | Admitting: Neurology

## 2020-05-30 ENCOUNTER — Ambulatory Visit: Payer: Medicare HMO | Admitting: Neurology

## 2020-05-30 ENCOUNTER — Encounter (HOSPITAL_COMMUNITY)
Admission: RE | Admit: 2020-05-30 | Discharge: 2020-05-30 | Disposition: A | Discharge status: Home / Self Care | Payer: Medicare HMO | Source: Ambulatory Visit | Attending: Cardiology | Admitting: Cardiology

## 2020-05-30 ENCOUNTER — Telehealth: Payer: Self-pay | Admitting: Neurology

## 2020-05-30 VITALS — BP 103/64 | HR 72 | Ht 71.0 in | Wt 157.0 lb

## 2020-05-30 DIAGNOSIS — I43 Cardiomyopathy in diseases classified elsewhere: Secondary | ICD-10-CM

## 2020-05-30 DIAGNOSIS — N1831 Chronic kidney disease, stage 3a: Secondary | ICD-10-CM | POA: Insufficient documentation

## 2020-05-30 DIAGNOSIS — E854 Organ-limited amyloidosis: Secondary | ICD-10-CM | POA: Diagnosis not present

## 2020-05-30 DIAGNOSIS — I129 Hypertensive chronic kidney disease with stage 1 through stage 4 chronic kidney disease, or unspecified chronic kidney disease: Secondary | ICD-10-CM | POA: Insufficient documentation

## 2020-05-30 DIAGNOSIS — R0600 Dyspnea, unspecified: Secondary | ICD-10-CM | POA: Diagnosis not present

## 2020-05-30 DIAGNOSIS — E1169 Type 2 diabetes mellitus with other specified complication: Secondary | ICD-10-CM | POA: Insufficient documentation

## 2020-05-30 DIAGNOSIS — I5022 Chronic systolic (congestive) heart failure: Secondary | ICD-10-CM | POA: Diagnosis not present

## 2020-05-30 DIAGNOSIS — G4731 Primary central sleep apnea: Secondary | ICD-10-CM | POA: Diagnosis not present

## 2020-05-30 NOTE — Patient Instructions (Signed)
VITERA FFM in Medium size is your new mask, medium SL>

## 2020-05-30 NOTE — Telephone Encounter (Signed)
[name] and his spouse are moving to [place], [place] coming [place]. I was searching for a sleep MD in the vicinity and found [facility], [employer]. There is a sleep center at [facility] as well, but I found no Sleep Neurologist. I will contact the [facility] to see who else may be practicing sleep medicine in the area and let [name] know. CD

## 2020-05-30 NOTE — Progress Notes (Signed)
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 Pete [name] for a new sleep evaluation . Wife has reported irregular breathing at night, gasping and mild snoring. He underwent a 2007 sleep study at [facility], with an AHI of 1.0/hr. Bradycardia. No CPAP was necessary.  He has been treated for RLS since 1999, on sinemet , Klonopin, Requip and now Mirapex ,now at 2 mg . He later developed heart block and in 2012 a cardiac pacemaker was implanted.He now gets more restless during the night. He has no insomnia, falls asleep easily but has 1-2 bathroom breaks. He talks in his sleep. He has kicked a ball during one of his dreams and fell out of bed. He has neuropathy, too.     Sleep habits are as follows: his bedtime is usually between 11 pm and 1 AM, he likes to work on his computer late.  He falls asleep promptly, in his cool, dark and quiet environment. He has to urinate once at 4 AM, and goes back to sleep. He sleeps until 5 AM, and stays in bed a little longer , rising at 6.30 AM. He feels refreshed , he can concentrate, focus.  He does have vivid dreams sometimes, but does not always remember them. He often sleeps supine, and in that position produces most apnea and irregular breathing. His wife has confirmed that the supine sleep position seems to predispose him to apnea. He sleeps on 2 pillows. He is restless- he likes a hard, firm surface. He likes a weighted blanket Result of ASV titration. Central Sleep Apnea, emerging under CPAP and BiPAP and finally controlled under ASV after other modalities failed.   PLANS/RECOMMENDATIONS:  We will place this patient on ASV- 15 maximum pressure support, 7 minimum pressure support and 4 cm EEP. Heated humidity and a  Respironics Dream wear FFM in size small were added to the order. David [name], M.D.    05 09-2017   12-31-2017, RV with David [name] present. Had abnormal creatinine levels, has complete heart block, pacemaker since 2012 ( after bradycardia), later had a CVA, recently  had severe water retention. Gained 20 pounds water weight while in [place] this summer when he didn't take his diuretics.  He uses ASV with success and noticed more alertness. This after failing first CPAP and then BiPAP.  He is chronically anticoagulated, has a pacemaker, and used to have more restless leg syndrome with neuropathy.  Controlled however on the Mirapex.  The patient did the best on an ASV machine with 15 cm maximum pressure support, 7 cm with a minimum pressure support and 4 cm expiratory range.  Heated humidity and a Respironics DreamWear full facemask was suggested. He continues to follow with Dr. Ladona [name] for electro- physiology. David [name] also reminded me that he has been taking Mirapex for about 17 years ever since it came to market for the treatment of restless legs.  His restless legs follow the development of neuropathy and he was interested to see if he still needs Mirapex, if the dose could be reduced or if other medications could supplement its effect.  We discussed Neurontin and for this reason the generic form of gabapentin I looked at his metabolic panels which revealed for February of this  year a decrease in creatinine clearance indicative of CKD stage II.   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  His use of the air curve ASV machine has been very sporadic over the last 8 months the last data I have are from early August, the machine is set at 7 cm expiratory pressure assistance with a minimum pressure support of 4 maximum  pressure support of 16.  The device worked only well but David [name] avoid sleeping in supine position which is hard to do with a full facemask.  So his full facemask also leaked used a ResMed F 20 and medium size.  For this reason I have changed him today to a be David [name] and pico mask and medium that feels much better around his nasal bridge and under eye region.  He will give this new set up a trial for the next 30 days and when he has reestablished compliance data I will order the supplies for him in the future at [facility].  He also announced that in about 3 months he and his wife will move to [place], [facility] I am going to look for a local sleep doctor that can follow-up with him, his fatigue severity score was 28 his Epworth sleepiness score was 5, he is not endorsing depression on the geriatric depression scale.  His apnea events were 1.8/h of use he did have several more hypopneas 8.0, he did have a 95th percentile air leak of 12.8 L/min again this seems to be related to supine sleep position.     UPDATE 2/10/2020CM DavidGonzales, 80 year old male returns for follow-up for ASV compliance.  He has not had any difficulty.  He is due for cardioversion February 26.  Data dated 06/03/2018-07/02/2018 shows compliance greater than 4 hours at 77% for 23 days.  Less than 4 hours 6 days 20% for total compliance of 97%.  Average use 6 hours 23 minutes.  Pressure 7 to 15 cm.  Leak 95th percentile 9.6.  AHI 18.4.  He returns for reevaluation REVIEW OF SYSTEMS: Full 14 system review of systems performed and notable only for those listed, all others are neg:  Constitutional: neg  Cardiovascular: neg Ear/Nose/Throat: neg  Skin: neg Eyes: neg Respiratory: neg Gastroitestinal: neg  Hematology/Lymphatic: Easy bruising Endocrine: Intolerance to cold Musculoskeletal: Walking difficulty Allergy/Immunology: neg Neurological: neg Psychiatric: neg Sleep : ASV     ALLERGIES: Allergies  Allergen Reactions  . Benadryl [Diphenhydramine Hcl] Other (See Comments)    Adverse reaction, has very strange behavior. Also hyperactive.  . Lisinopril Other (See Comments)    hypotensive  . Restoril [Temazepam] Other (See Comments)    Hyperactive    HOME MEDICATIONS: Outpatient Medications Prior to Visit  Medication Sig Dispense Refill  . acetaminophen (TYLENOL) 500 MG tablet Take 1,000 mg by mouth every 6 (six) hours as needed for mild pain.     [name] apixaban (ELIQUIS) 2.5 MG TABS tablet Take 1 tablet (2.5 mg total) by mouth 2 (two) times daily. 30 tablet 0  . ascorbic acid (VITAMIN C) 500 MG tablet Take 500 mg by mouth daily. Pt states taking 3/week    . Cholecalciferol (VITAMIN D) 50 MCG (2000 UT) tablet Take 2,000 Units by mouth daily.    . Coenzyme Q10 (COQ10) 100  MG CAPS Take 100 mg by mouth daily.     . Cyanocobalamin (VITAMIN B-12) 2500 MCG SUBL Place 2,500 mcg under the tongue daily.     . dapagliflozin propanediol (FARXIGA) 10 MG TABS tablet Take 1 tablet (10 mg total) by mouth daily before breakfast. 30 tablet 6  . ferrous sulfate 325 (65 FE) MG tablet Take 325 mg by mouth 3 (three) times a week.    . fluticasone (FLONASE) 50 MCG/ACT nasal spray Place 1 spray into both nostrils daily as needed for allergies or rhinitis.    [name] loratadine (CLARITIN) 10 MG tablet 1 tablet    . magnesium oxide (MAG-OX) 400 MG tablet Take 400 mg by mouth daily.    . metoprolol succinate (TOPROL XL) 25 MG 24 hr tablet Take 0.5 tablets (12.5 mg total) by mouth at bedtime. 45 tablet 3  . Multiple Vitamins-Minerals (PRESERVISION AREDS 2) CAPS Take 1 capsule by mouth 2 (two) times daily.    . nitroGLYCERIN (NITROSTAT) 0.4 MG SL tablet Place 1 tablet (0.4 mg total) under the tongue every 5 (five) minutes as needed. For chest pain 25 tablet 5  . Polyvinyl Alcohol-Povidone (REFRESH OP) Place 1 drop into both eyes daily as needed (dry eyes).    . potassium chloride SA (KLOR-CON)  20 MEQ tablet Take 20 mEq by mouth daily.    . pramipexole (MIRAPEX) 1 MG tablet Take 2 mg by mouth at bedtime.    . simvastatin (ZOCOR) 10 MG tablet Take 10 mg by mouth every evening.    [name] spironolactone (ALDACTONE) 25 MG tablet Take 0.5 tablets (12.5 mg total) by mouth at bedtime. 90 tablet 3  . Tafamidis (VYNDAMAX) 61 MG CAPS Take 61 mg by mouth daily. 90 capsule 3  . torsemide (DEMADEX) 20 MG tablet Take 2 tablets (40 mg total) by mouth daily. Alternate with 40mg  (2 tablets) and 20mg  (1 tablet) every other day 60 tablet 3  . triamcinolone cream (KENALOG) 0.1 % Apply 1 application topically 2 (two) times daily as needed (itching).     No facility-administered medications prior to visit.    PAST MEDICAL HISTORY: Past Medical History:  Diagnosis Date  . Aortic stenosis    a.mild-mod by echo 06/2017.  . Ascending aorta dilatation (HCC)    a. mildly dilated aortic root and mod dilated ascending aorta by echo 06/2017.  [name] Chronic diastolic CHF (congestive heart failure) ([place])   . CKD (chronic kidney disease), stage III ([place])   . Complete heart block (HCC)    a. s/p STJ pacemaker in 02/2011 - Dr. [name]  . Coronary artery disease    a. LAD atherectomy 1999.  . Gilbert's syndrome   . Hypertension   . Insomnia, unspecified   . Pacemaker-[place] [facility] 07/16/2011  . PAF (paroxysmal atrial fibrillation) ([place])    a. identified on pacemaker interrogation; on Eliquis.  . Peripheral neuropathy   . Pure hypercholesterolemia   . Restless leg syndrome   . Spermatocele    near left testicle 2 cm  . Spinal stenosis, lumbar   . Thrombocytopenia ([place])    Averaging 140,000 since 2008  . Tubular adenoma 09/2012   Dr. Watt Climes repeat 4 years  . Type II or unspecified type diabetes mellitus without mention of complication, not stated as uncontrolled   . Unspecified sleep apnea     PAST SURGICAL HISTORY: Past Surgical History:  Procedure Laterality Date  . APPENDECTOMY  1957  . CHOLECYSTECTOMY  1988  .  CORONARY STENT PLACEMENT    .  HEMORROIDECTOMY  1980  . HERNIA REPAIR Bilateral   . LEAD INSERTION N/A 05/05/2019   Procedure: LEAD INSERTION;  Surgeon: Evans Lance, MD;  Location: [facility];  Service: Cardiovascular;  Laterality: N/A;  . pacermaker  07/16/11   Dr. [name]  . PPM GENERATOR CHANGEOUT N/A 05/05/2019   Procedure: PPM GENERATOR CHANGEOUT;  Surgeon: Evans Lance, MD;  Location: [facility];  Service: Cardiovascular;  Laterality: N/A;  . TEE WITHOUT CARDIOVERSION N/A 06/14/2014   Procedure: TRANSESOPHAGEAL ECHOCARDIOGRAM (TEE);  Surgeon: [name], MD;  Location: [facility];  Service: Cardiovascular;  Laterality: N/A;    FAMILY HISTORY: Family History  Problem Relation Age of Onset  . Cancer Mother        Small cell lung cancer  . Diabetes Father        DM  . CAD Father   . Hypertension Father   . Heart disease Father   . Heart attack Father   . Hypertension Sister     SOCIAL HISTORY: Social History   Socioeconomic History  . Marital status: Married    Spouse name: Not on file  . Number of children: Not on file  . Years of education: Not on file  . Highest education level: Not on file  Occupational History  . Not on file  Tobacco Use  . Smoking status: Former Research scientist (life sciences)  . Smokeless tobacco: Never Used  Vaping Use  . Vaping Use: Never used  Substance and Sexual Activity  . Alcohol use: Yes    Comment: occasional glass of wine  . Drug use: No  . Sexual activity: Not on file  Other Topics Concern  . Not on file  Social History Narrative  . Not on file   Social Determinants of Health   Financial Resource Strain: Not on file  Food Insecurity: Not on file  Transportation Needs: Not on file  Physical Activity: Not on file  Stress: Not on file  Social Connections: Not on file  Intimate Partner Violence: Not on file     PHYSICAL EXAM  Vitals:   05/30/20 0822  BP: 103/64  Pulse: 72  Weight: 157 lb (71.2 kg)  Height: 5\' 11"   (1.803 m)   Body mass index is 21.9 kg/m.  Generalized: Well developed, in no acute distress  Head: normocephalic and atraumatic,. Oropharynx benign mallopatti 2 Neck: Supple, circumference 15.5 Musculoskeletal: No deformity   Neurological examination   Mentation: Alert oriented to time, place, history taking. Attention span and concentration appropriate. Recent and remote memory intact.  Follows all commands speech and language fluent.   Cranial nerve II-XII: .Pupils were equal round reactive to light extraocular movements were full, visual field were full on confrontational test. Facial sensation and strength were normal. hearing was intact to finger rubbing bilaterally. Uvula tongue midline. head turning and shoulder shrug were normal and symmetric.Tongue protrusion into cheek strength was normal. Motor: normal bulk and tone, full strength in the BUE, BLE,  Sensory: normal and symmetric to light touch, Coordination: finger-nose-finger, heel-to-shin bilaterally, no dysmetria Gait and Station: Rising up from seated position without assistance, normal stance,  moderate stride, good arm swing, smooth turning, able to perform tiptoe, and heel walking without difficulty. Tandem gait is mildly unsteady  DIAGNOSTIC DATA (LABS, IMAGING, TESTING) - I reviewed patient records, labs, notes, testing and imaging myself where available.  Lab Results  Component Value Date   WBC 6.7 12/14/2019   HGB 13.3 12/14/2019   HCT 41.1 12/14/2019  MCV 97.4 12/14/2019   PLT 148 (L) 12/14/2019      Component Value Date/Time   NA 136 03/15/2020 1240   NA 143 04/26/2019 1545   K 4.0 03/15/2020 1240   CL 97 (L) 03/15/2020 1240   CO2 26 03/15/2020 1240   GLUCOSE 114 (H) 03/15/2020 1240   BUN 49 (H) 03/15/2020 1240   BUN 37 (H) 04/26/2019 1545   CREATININE 1.61 (H) 03/15/2020 1240   CREATININE 1.30 (H) 01/31/2016 1545   CALCIUM 9.3 03/15/2020 1240   CALCIUM 9.3 12/14/2019 1305   PROT 6.7 05/10/2019  1313   ALBUMIN 3.6 05/04/2015 0818   AST 29 05/04/2015 0818   ALT 35 05/04/2015 0818   ALKPHOS 84 05/04/2015 0818   BILITOT 2.3 (H) 05/04/2015 0818   GFRNONAA 40 (L) 03/15/2020 1240   GFRAA 47 (L) 01/05/2020 1135   Lab Results  Component Value Date   CHOL 119 (L) 05/04/2015   HDL 58 05/04/2015   LDLCALC 49 05/04/2015   TRIG 62 05/04/2015   CHOLHDL 2.1 05/04/2015    Lab Results  Component Value Date   TSH 5.138 (H) 12/14/2019      ASSESSMENT AND PLAN  80 year old male with central apnea on ASV. Poor compliance.    Pressure EPAP, 7 cm And pressure support between 4 and  16 cm.  Leak 95th percentile 12.8/h.    AHI 9.8/h  Restless leg syndrome on Mirapex      Plan:     Keep on ASV , new mask will be a Vitera medium which fits him much better  Increase max pressure to 16 -AHI was now lowerat   Continue other settings.  Transfer to [place], [address], [place] [address] [phone]

## 2020-05-30 NOTE — Progress Notes (Signed)
Daily Session Note  Patient Details  Name: David [name] MRN: [id number] Date of Birth: 04-30-1941 Referring Provider:   April Manson Pulmonary Rehab Walk Test from 05/01/2020 in [place]  Referring Provider Loralie Champagne, MD      Encounter Date: 05/30/2020  Check In:  Session Check In - 05/30/20 1104      Check-In   Supervising physician immediately available to respond to emergencies [facility] immediately available    Physician(s) Dr. Florene Glen    Location [facility]    Staff Present Rosebud Poles, RN, BSN;Lisa Ysidro Evert, RN;Jessica Hassell Done, MS, ACSM-CEP, Exercise Physiologist    Virtual Visit No    Medication changes reported     No    Fall or balance concerns reported    No    Tobacco Cessation No Change    Warm-up and Cool-down Performed on first and last piece of equipment    Resistance Training Performed Yes    VAD Patient? No    PAD/SET Patient? No      Pain Assessment   Currently in Pain? No/denies    Multiple Pain Sites No           Capillary Blood Glucose: No results found for this or any previous visit (from the past 24 hour(s)).    Social History   Tobacco Use  Smoking Status Former Smoker  Smokeless Tobacco Never Used    Goals Met:  Proper associated with RPD/PD & O2 Sat Exercise tolerated well Strength training completed today  Goals Unmet:  Not Applicable  Comments: Service time is from [place] to 1147.   Dr. Fransico Him is Medical Director for Cardiac Rehab at [facility].

## 2020-05-30 NOTE — Progress Notes (Signed)
David [name] 80 y.o. male Nutrition Note  Visit Diagnosis: Heart failure, chronic systolic [facility])   Past Medical History:  Diagnosis Date  . Aortic stenosis    a.mild-mod by echo 06/2017.  . Ascending aorta dilatation (HCC)    a. mildly dilated aortic root and mod dilated ascending aorta by echo 06/2017.  [name] Chronic diastolic CHF (congestive heart failure) (HCC)   . CKD (chronic kidney disease), stage III (HCC)   . Complete heart block (HCC)    a. s/p STJ pacemaker in 02/2011 - Dr. Graciela Husbands  . Coronary artery disease    a. LAD atherectomy 1999.  . Gilbert's syndrome   . Hypertension   . Insomnia, unspecified   . Pacemaker-[employer] 07/16/2011  . PAF (paroxysmal atrial fibrillation) (HCC)    a. identified on pacemaker interrogation; on Eliquis.  . Peripheral neuropathy   . Pure hypercholesterolemia   . Restless leg syndrome   . Spermatocele    near left testicle 2 cm  . Spinal stenosis, lumbar   . Thrombocytopenia (HCC)    Averaging 140,000 since 2008  . Tubular adenoma 09/2012   Dr. Ewing Schlein repeat 4 years  . Type II or unspecified type diabetes mellitus without mention of complication, not stated as uncontrolled   . Unspecified sleep apnea      Medications reviewed. Noted torsemide, farxiga. Reviewed supplements.    Current Outpatient Medications:  .  acetaminophen (TYLENOL) 500 MG tablet, Take 1,000 mg by mouth every 6 (six) hours as needed for mild pain. , Disp: , Rfl:  .  apixaban (ELIQUIS) 2.5 MG TABS tablet, Take 1 tablet (2.5 mg total) by mouth 2 (two) times daily., Disp: 30 tablet, Rfl: 0 .  ascorbic acid (VITAMIN C) 500 MG tablet, Take 500 mg by mouth daily. Pt states taking 3/week, Disp: , Rfl:  .  Cholecalciferol (VITAMIN D) 50 MCG (2000 UT) tablet, Take 2,000 Units by mouth daily., Disp: , Rfl:  .  Coenzyme Q10 (COQ10) 100 MG CAPS, Take 100 mg by mouth daily. , Disp: , Rfl:  .  Cyanocobalamin (VITAMIN B-12) 2500 MCG SUBL, Place 2,500 mcg under the tongue daily. ,  Disp: , Rfl:  .  dapagliflozin propanediol (FARXIGA) 10 MG TABS tablet, Take 1 tablet (10 mg total) by mouth daily before breakfast., Disp: 30 tablet, Rfl: 6 .  ferrous sulfate 325 (65 FE) MG tablet, Take 325 mg by mouth 3 (three) times a week., Disp: , Rfl:  .  fluticasone (FLONASE) 50 MCG/ACT nasal spray, Place 1 spray into both nostrils daily as needed for allergies or rhinitis., Disp: , Rfl:  .  loratadine (CLARITIN) 10 MG tablet, 1 tablet, Disp: , Rfl:  .  magnesium oxide (MAG-OX) 400 MG tablet, Take 400 mg by mouth daily., Disp: , Rfl:  .  metoprolol succinate (TOPROL XL) 25 MG 24 hr tablet, Take 0.5 tablets (12.5 mg total) by mouth at bedtime., Disp: 45 tablet, Rfl: 3 .  Multiple Vitamins-Minerals (PRESERVISION AREDS 2) CAPS, Take 1 capsule by mouth 2 (two) times daily., Disp: , Rfl:  .  nitroGLYCERIN (NITROSTAT) 0.4 MG SL tablet, Place 1 tablet (0.4 mg total) under the tongue every 5 (five) minutes as needed. For chest pain, Disp: 25 tablet, Rfl: 5 .  Polyvinyl Alcohol-Povidone (REFRESH OP), Place 1 drop into both eyes daily as needed (dry eyes)., Disp: , Rfl:  .  potassium chloride SA (KLOR-CON) 20 MEQ tablet, Take 20 mEq by mouth daily., Disp: , Rfl:  .  pramipexole (MIRAPEX)  1 MG tablet, Take 2 mg by mouth at bedtime., Disp: , Rfl:  .  simvastatin (ZOCOR) 10 MG tablet, Take 10 mg by mouth every evening., Disp: , Rfl:  .  spironolactone (ALDACTONE) 25 MG tablet, Take 0.5 tablets (12.5 mg total) by mouth at bedtime., Disp: 90 tablet, Rfl: 3 .  Tafamidis (VYNDAMAX) 61 MG CAPS, Take 61 mg by mouth daily., Disp: 90 capsule, Rfl: 3 .  torsemide (DEMADEX) 20 MG tablet, Take 2 tablets (40 mg total) by mouth daily. Alternate with 40mg  (2 tablets) and 20mg  (1 tablet) every other day, Disp: 60 tablet, Rfl: 3 .  triamcinolone cream (KENALOG) 0.1 %, Apply 1 application topically 2 (two) times daily as needed (itching)., Disp: , Rfl:    Ht Readings from Last 1 Encounters:  05/30/20 5\' 11"  (1.803 m)      Wt Readings from Last 3 Encounters:  05/30/20 157 lb (71.2 kg)  05/09/20 160 lb 0.9 oz (72.6 kg)  05/01/20 160 lb 7.9 oz (72.8 kg)     There is no height or weight on file to calculate BMI.   Social History   Tobacco Use  Smoking Status Former Smoker  Smokeless Tobacco Never Used      Nutrition Note  Spoke with pt. Nutrition Plan and Nutrition Survey goals reviewed with pt.   Weight loss 30 lbs in 7 months (January 2021 to July 2021). He has gained about 5 lbs back. He would like to continue gaining weight.    Pt with Type 2 diabetes. Last A1C was 6.4 on 01/14/20.   Pt with dx of CHF. Per discussion, pt does not use canned/convenience foods often. When he does eat convenience foods, he modifies them to reduce sodium intake. Pt does not add salt to food. Pt does not eat out frequently.   Pt with CKD stage 3. Most recent GFR noted- 40 mL/min. Pt has had nutrition counseling for renal friendly diet.  He's trying to drink Boost but often forgets.  We discussed high calorie foods.  Pt expressed understanding of the information reviewed.   Nutrition Diagnosis ? Inadequate oral intake related taste changes/poor appetite as evidenced by weight loss 30 lbs in 7 months, pt report of starting a new medication in January and decreased intake following   Nutrition Intervention ? Pt's individual nutrition plan reviewed with pt. ? Estimated energy needs o 30-35 kcals/kg BW = 2130-2485 kcals/day o Protein goals: 0.8 g/kg BW = 57 g/day  ? Continue client-centered nutrition education by RD, as part of interdisciplinary care.  Goal(s) ? Pt to identify food quantities necessary to achieve weight gain of 0.5 lbs week at while in cardiac rehab program ? Pt to meet protein and calorie goals  ? Pt to choose lower sodium, phosphorus, and potassium foods Plan:   Will provide client-centered nutrition education as part of interdisciplinary care  Monitor and evaluate progress toward  nutrition goal with team.   Michaele Offer, MS, RDN, LDN

## 2020-05-31 ENCOUNTER — Telehealth: Payer: Self-pay | Admitting: Physician Assistant

## 2020-05-31 NOTE — Telephone Encounter (Signed)
Patient's wife is calling for patient to get a refill on [place].  Patient lost the information to pharmacy.

## 2020-06-01 ENCOUNTER — Encounter (HOSPITAL_COMMUNITY)
Admission: RE | Admit: 2020-06-01 | Discharge: 2020-06-01 | Disposition: A | Discharge status: Home / Self Care | Payer: Medicare HMO | Source: Ambulatory Visit | Attending: Cardiology | Admitting: Cardiology

## 2020-06-01 ENCOUNTER — Other Ambulatory Visit: Payer: Self-pay

## 2020-06-01 DIAGNOSIS — I5022 Chronic systolic (congestive) heart failure: Secondary | ICD-10-CM | POA: Diagnosis not present

## 2020-06-01 MED ORDER — TOLAK 4 % EX CREA: 1.0000 "application " | TOPICAL_CREAM | Freq: Every day | CUTANEOUS | 1 refills | Status: AC

## 2020-06-01 NOTE — Progress Notes (Signed)
Daily Session Note  Patient Details  Name: David [name] MRN: [id number] Date of Birth: [birth year] Referring Provider:   April Manson Pulmonary Rehab Walk Test from 05/01/2020 in [place]  Referring Provider Loralie Champagne, MD      Encounter Date: 06/01/2020  Check In:  Session Check In - 06/01/20 1104      Check-In   Supervising physician immediately available to respond to emergencies [facility] immediately available    Physician(s) Dr. Horris Latino    Location [facility]    Staff Present Rosebud Poles, RN, BSN;Aylani Spurlock Ysidro Evert, RN;Jessica Hassell Done, MS, ACSM-CEP, Exercise Physiologist    Virtual Visit No    Medication changes reported     No    Fall or balance concerns reported    No    Tobacco Cessation No Change    Warm-up and Cool-down Performed on first and last piece of equipment    Resistance Training Performed Yes    VAD Patient? No    PAD/SET Patient? No      Pain Assessment   Currently in Pain? No/denies    Multiple Pain Sites No           Capillary Blood Glucose: No results found for this or any previous visit (from the past 24 hour(s)).    Social History   Tobacco Use  Smoking Status Former Smoker  Smokeless Tobacco Never Used    Goals Met:  Exercise tolerated well No report of cardiac concerns or symptoms Strength training completed today  Goals Unmet:  Not Applicable  Comments: Service time is from 1050 to 1150    Dr. Fransico Him is Medical Director for Cardiac Rehab at [facility].

## 2020-06-06 ENCOUNTER — Other Ambulatory Visit: Payer: Self-pay

## 2020-06-06 ENCOUNTER — Encounter (HOSPITAL_COMMUNITY)
Admission: RE | Admit: 2020-06-06 | Discharge: 2020-06-06 | Disposition: A | Discharge status: Home / Self Care | Payer: Medicare HMO | Source: Ambulatory Visit | Attending: Cardiology | Admitting: Cardiology

## 2020-06-06 VITALS — Wt 159.0 lb

## 2020-06-06 DIAGNOSIS — I5022 Chronic systolic (congestive) heart failure: Secondary | ICD-10-CM

## 2020-06-06 NOTE — Progress Notes (Signed)
Pulmonary Individual Treatment Plan  Patient Details  Name: David [name] MRN: [id number] Date of Birth: 25-Sep-1940 Referring Provider:   April Manson Pulmonary Rehab Walk Test from 05/01/2020 in [place]  Referring Provider Loralie Champagne, MD      Initial Encounter Date:  Flowsheet Row Pulmonary Rehab Walk Test from 05/01/2020 in [place]  Date 05/08/20      Visit Diagnosis: Heart failure, chronic systolic ([place])  Patient's Home Medications on Admission:   Current Outpatient Medications:  .  acetaminophen (TYLENOL) 500 MG tablet, Take 1,000 mg by mouth every 6 (six) hours as needed for mild pain. , Disp: , Rfl:  .  apixaban (ELIQUIS) 2.5 MG TABS tablet, Take 1 tablet (2.5 mg total) by mouth 2 (two) times daily., Disp: 30 tablet, Rfl: 0 .  ascorbic acid (VITAMIN C) 500 MG tablet, Take 500 mg by mouth daily. Pt states taking 3/week, Disp: , Rfl:  .  Cholecalciferol (VITAMIN D) 50 MCG (2000 UT) tablet, Take 2,000 Units by mouth daily., Disp: , Rfl:  .  Coenzyme Q10 (COQ10) 100 MG CAPS, Take 100 mg by mouth daily. , Disp: , Rfl:  .  Cyanocobalamin (VITAMIN B-12) 2500 MCG SUBL, Place 2,500 mcg under the tongue daily. , Disp: , Rfl:  .  dapagliflozin propanediol (FARXIGA) 10 MG TABS tablet, Take 1 tablet (10 mg total) by mouth daily before breakfast., Disp: 30 tablet, Rfl: 6 .  ferrous sulfate 325 (65 FE) MG tablet, Take 325 mg by mouth 3 (three) times a week., Disp: , Rfl:  .  Fluorouracil (TOLAK) 4 % CREA, Apply 1 application topically at bedtime., Disp: 40 g, Rfl: 1 .  fluticasone (FLONASE) 50 MCG/ACT nasal spray, Place 1 spray into both nostrils daily as needed for allergies or rhinitis., Disp: , Rfl:  .  loratadine (CLARITIN) 10 MG tablet, 1 tablet, Disp: , Rfl:  .  magnesium oxide (MAG-OX) 400 MG tablet, Take 400 mg by mouth daily., Disp: , Rfl:  .  metoprolol succinate (TOPROL XL) 25 MG 24 hr tablet, Take 0.5 tablets  (12.5 mg total) by mouth at bedtime., Disp: 45 tablet, Rfl: 3 .  Multiple Vitamins-Minerals (PRESERVISION AREDS 2) CAPS, Take 1 capsule by mouth 2 (two) times daily., Disp: , Rfl:  .  nitroGLYCERIN (NITROSTAT) 0.4 MG SL tablet, Place 1 tablet (0.4 mg total) under the tongue every 5 (five) minutes as needed. For chest pain, Disp: 25 tablet, Rfl: 5 .  Polyvinyl Alcohol-Povidone (REFRESH OP), Place 1 drop into both eyes daily as needed (dry eyes)., Disp: , Rfl:  .  potassium chloride SA (KLOR-CON) 20 MEQ tablet, Take 20 mEq by mouth daily., Disp: , Rfl:  .  pramipexole (MIRAPEX) 1 MG tablet, Take 2 mg by mouth at bedtime., Disp: , Rfl:  .  simvastatin (ZOCOR) 10 MG tablet, Take 10 mg by mouth every evening., Disp: , Rfl:  .  spironolactone (ALDACTONE) 25 MG tablet, Take 0.5 tablets (12.5 mg total) by mouth at bedtime., Disp: 90 tablet, Rfl: 3 .  Tafamidis (VYNDAMAX) 61 MG CAPS, Take 61 mg by mouth daily., Disp: 90 capsule, Rfl: 3 .  torsemide (DEMADEX) 20 MG tablet, Take 2 tablets (40 mg total) by mouth daily. Alternate with 59m (2 tablets) and 279m(1 tablet) every other day, Disp: 60 tablet, Rfl: 3 .  triamcinolone cream (KENALOG) 0.1 %, Apply 1 application topically 2 (two) times daily as needed (itching)., Disp: , Rfl:   Past  Medical History: Past Medical History:  Diagnosis Date  . Aortic stenosis    a.mild-mod by echo 06/2017.  . Ascending aorta dilatation (HCC)    a. mildly dilated aortic root and mod dilated ascending aorta by echo 06/2017.  [name] Chronic diastolic CHF (congestive heart failure) ([place])   . CKD (chronic kidney disease), stage III ([place])   . Complete heart block (HCC)    a. s/p STJ pacemaker in 02/2011 - Dr. [name]  . Coronary artery disease    a. LAD atherectomy 1999.  . Gilbert's syndrome   . Hypertension   . Insomnia, unspecified   . Pacemaker-[facility] 07/16/2011  . PAF (paroxysmal atrial fibrillation) ([place])    a. identified on pacemaker interrogation; on Eliquis.  .  Peripheral neuropathy   . Pure hypercholesterolemia   . Restless leg syndrome   . Spermatocele    near left testicle 2 cm  . Spinal stenosis, lumbar   . Thrombocytopenia ([place])    Averaging 140,000 since 2008  . Tubular adenoma 09/2012   Dr. Watt Climes repeat 4 years  . Type II or unspecified type diabetes mellitus without mention of complication, not stated as uncontrolled   . 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[employer] [facility] Latest Ref Rng & Units 05/04/2015   Cholestrol 125 - 200 mg/dL 119(L)   LDLCALC <130 mg/dL 49   HDL >=40 mg/dL 58   Trlycerides <150 mg/dL 62      Capillary Blood Glucose: Lab Results  Component Value Date   GLUCAP 114 (H) 05/05/2019   GLUCAP 132 (H) 06/14/2014     Pulmonary Assessment Scores:  Pulmonary Assessment Scores    Row Name 05/01/20 1148         ADL UCSD   ADL Phase Entry     SOB Score total 31           CAT Score   CAT Score 13           mMRC Score   mMRC Score 1           UCSD: Self-administered rating of dyspnea associated with activities of daily living (ADLs) 6-point scale (0 = "not at all" to 5 = "maximal or unable to do because of breathlessness")  Scoring Scores range from 0 to 120.  Minimally important difference is 5 units  CAT: CAT can identify the health impairment of COPD patients and is better correlated with disease progression.  CAT has a scoring range of zero to 40. The CAT score is classified into four groups of low (less than 10), medium (10 - 20), high (21-30) and very high (31-40) based on the impact level of disease on health status. A CAT score over 10 suggests significant symptoms.  A worsening CAT score could be explained by an exacerbation, poor medication adherence, poor inhaler technique, or progression of COPD or comorbid conditions.   CAT MCID is 2 points  mMRC: mMRC (Modified Medical Research Council) Dyspnea Scale is used to assess the degree of baseline functional disability in patients of respiratory disease due to dyspnea. No minimal important difference is established. A decrease in score of 1 point or greater is considered a positive change.   Pulmonary Function Assessment:  Pulmonary Function Assessment - 05/01/20 1147      Breath  Bilateral Breath Sounds Clear    Shortness of Breath No           Exercise Target Goals: Exercise Program Goal: Individual exercise prescription set using results from initial 6 min walk test and THRR while considering  patient's activity barriers and safety.   Exercise Prescription Goal: Initial exercise prescription builds to 30-45 minutes a day of aerobic activity, 2-3 days per week.  Home exercise guidelines will be given to patient during program as part of exercise prescription that the participant will acknowledge.  Activity Barriers & Risk Stratification:  Activity Barriers & Cardiac Risk Stratification - 05/01/20 1122      Activity Barriers & Cardiac Risk Stratification   Activity Barriers Arthritis;Deconditioning;Muscular Weakness           6 Minute Walk:  6 Minute Walk    Row Name 05/01/20 1150         6 Minute Walk   Phase Initial     Distance 1215 feet     Walk Time 6 minutes     # of Rest Breaks 0     MPH 2.3     METS 2.6     RPE 14     Perceived Dyspnea  2     VO2 Peak 9     Symptoms Yes (comment)     Comments Pt c/o bilateral leg fatigue, denies pain     Resting HR 74 bpm     Resting BP 114/62     Resting Oxygen Saturation  98 %     Exercise Oxygen Saturation  during 6 min walk 98 %     Max Ex. HR 93 bpm     Max Ex. BP 138/70     2 Minute Post BP 124/70           Interval HR   1 Minute HR 80     2 Minute HR 79     3 Minute HR 86     4 Minute HR 88     5 Minute HR 89     6 Minute HR 93     2 Minute Post HR 71     Interval  Heart Rate? Yes           Interval Oxygen   Interval Oxygen? Yes     Baseline Oxygen Saturation % 98 %     1 Minute Oxygen Saturation % 97 %     1 Minute Liters of Oxygen 0 L     2 Minute Oxygen Saturation % 98 %     2 Minute Liters of Oxygen 0 L     3 Minute Oxygen Saturation % 98 %     3 Minute Liters of Oxygen 0 L     4 Minute Oxygen Saturation % 97 %     4 Minute Liters of Oxygen 0 L     5 Minute Oxygen Saturation % 98 %     5 Minute Liters of Oxygen 0 L     6 Minute Oxygen Saturation % 98 %     6 Minute Liters of Oxygen 0 L     2 Minute Post Oxygen Saturation % 99 %     2 Minute Post Liters of Oxygen 0 L            Oxygen Initial Assessment:  Oxygen Initial Assessment - 05/01/20 1147      Home Oxygen   Home Oxygen Device None  Sleep Oxygen Prescription None    Home Exercise Oxygen Prescription None    Home Resting Oxygen Prescription None      Initial 6 min Walk   Oxygen Used None      Program Oxygen Prescription   Program Oxygen Prescription None      Intervention   Short Term Goals To learn and understand importance of monitoring SPO2 with pulse oximeter and demonstrate accurate use of the pulse oximeter.;To learn and understand importance of maintaining oxygen saturations>88%;To learn and demonstrate proper pursed lip breathing techniques or other breathing techniques.    Long  Term Goals Exhibits compliance with exercise, home and travel O2 prescription;Verbalizes importance of monitoring SPO2 with pulse oximeter and return demonstration;Maintenance of O2 saturations>88%;Exhibits proper breathing techniques, such as pursed lip breathing or other method taught during program session           Oxygen Re-Evaluation:  Oxygen Re-Evaluation    Row Name 05/09/20 0852 06/06/20 0741           Program Oxygen Prescription   Program Oxygen Prescription None None             Home Oxygen   Home Oxygen Device None None      Sleep Oxygen Prescription None  None      Home Exercise Oxygen Prescription None None      Home Resting Oxygen Prescription None None             Goals/Expected Outcomes   Short Term Goals To learn and understand importance of monitoring SPO2 with pulse oximeter and demonstrate accurate use of the pulse oximeter.;To learn and understand importance of maintaining oxygen saturations>88%;To learn and demonstrate proper pursed lip breathing techniques or other breathing techniques. To learn and understand importance of monitoring SPO2 with pulse oximeter and demonstrate accurate use of the pulse oximeter.;To learn and understand importance of maintaining oxygen saturations>88%;To learn and demonstrate proper pursed lip breathing techniques or other breathing techniques.      Long  Term Goals Exhibits compliance with exercise, home and travel O2 prescription;Verbalizes importance of monitoring SPO2 with pulse oximeter and return demonstration;Maintenance of O2 saturations>88%;Exhibits proper breathing techniques, such as pursed lip breathing or other method taught during program session Exhibits compliance with exercise, home and travel O2 prescription;Verbalizes importance of monitoring SPO2 with pulse oximeter and return demonstration;Maintenance of O2 saturations>88%;Exhibits proper breathing techniques, such as pursed lip breathing or other method taught during program session      Goals/Expected Outcomes Compliance and understanding of oxygen saturation and pursed lip breathing Compliance and understanding of oxygen saturation and pursed lip breathing             Oxygen Discharge (Final Oxygen Re-Evaluation):  Oxygen Re-Evaluation - 06/06/20 0741      Program Oxygen Prescription   Program Oxygen Prescription None      Home Oxygen   Home Oxygen Device None    Sleep Oxygen Prescription None    Home Exercise Oxygen Prescription None    Home Resting Oxygen Prescription None      Goals/Expected Outcomes   Short Term Goals  To learn and understand importance of monitoring SPO2 with pulse oximeter and demonstrate accurate use of the pulse oximeter.;To learn and understand importance of maintaining oxygen saturations>88%;To learn and demonstrate proper pursed lip breathing techniques or other breathing techniques.    Long  Term Goals Exhibits compliance with exercise, home and travel O2 prescription;Verbalizes importance of monitoring SPO2 with pulse oximeter and return demonstration;Maintenance of O2  saturations>88%;Exhibits proper breathing techniques, such as pursed lip breathing or other method taught during program session    Goals/Expected Outcomes Compliance and understanding of oxygen saturation and pursed lip breathing           Initial Exercise Prescription:  Initial Exercise Prescription - 05/01/20 1200      Date of Initial Exercise RX and Referring Provider   Date 05/08/20    Referring Provider Loralie Champagne, MD    Expected Discharge Date 07/07/19      NuStep   Level 2    SPM 75    Minutes 25    METs 2      Prescription Details   Frequency (times per week) 2    Duration Progress to 30 minutes of continuous aerobic without signs/symptoms of physical distress      Intensity   THRR 40-80% of Max Heartrate 56-111    Ratings of Perceived Exertion 11-13    Perceived Dyspnea 0-4      Progression   Progression Continue progressive overload as per policy without signs/symptoms or physical distress.      Resistance Training   Training Prescription Yes    Weight Orange bands           Perform Capillary Blood Glucose checks as needed.  Exercise Prescription Changes:  Exercise Prescription Changes    Row Name 05/09/20 1100 05/16/20 1208           Response to Exercise   Blood Pressure (Admit) 104/60 122/70      Blood Pressure (Exercise) 110/54 -      Blood Pressure (Exit) 108/60 104/60      Heart Rate (Admit) 70 bpm 74 bpm      Heart Rate (Exercise) 94 bpm 93 bpm      Heart Rate  (Exit) 70 bpm 83 bpm      Oxygen Saturation (Admit) 100 % 99 %      Oxygen Saturation (Exercise) 100 % 99 %      Oxygen Saturation (Exit) 98 % 98 %      Rating of Perceived Exertion (Exercise) 11 13      Perceived Dyspnea (Exercise) 1 1      Duration Progress to 45 minutes of aerobic exercise without signs/symptoms of physical distress Progress to 30 minutes of  aerobic without signs/symptoms of physical distress      Intensity Other (comment)  40-80% HR Max THRR unchanged             Progression   Progression Continue to progress workloads to maintain intensity without signs/symptoms of physical distress. Continue to progress workloads to maintain intensity without signs/symptoms of physical distress.             Resistance Training   Training Prescription Yes Yes      Weight Orange bands orange bands      Reps 10-15 10-15      Time 10 Minutes 10 Minutes             NuStep   Level 3 4      SPM 80 80      Minutes 30 15      METs 2.4 2.8             Track   Laps - 18      Minutes - 15             Exercise Comments:  Exercise Comments    Row Name 05/09/20 1155  Exercise Comments Pt completed first day of exercise and tolerated very well with no complaints or concerns. He got 2.4 METS on the Nustep and walked 16 laps on the track for 2.8 METS. He does have limited range of motion in his right shoulder but was still able to do all of the band exercise.              Exercise Goals and Review:   Exercise Goals Re-Evaluation :  Exercise Goals Re-Evaluation    Row Name 05/09/20 0848 06/06/20 0742           Exercise Goal Re-Evaluation   Exercise Goals Review Increase Physical Activity;Increase Strength and Stamina;Able to understand and use rate of perceived exertion (RPE) scale;Able to understand and use Dyspnea scale;Knowledge and understanding of Target Heart Rate Range (THRR);Understanding of Exercise Prescription Increase Physical Activity;Increase  Strength and Stamina;Able to understand and use rate of perceived exertion (RPE) scale;Able to understand and use Dyspnea scale;Knowledge and understanding of Target Heart Rate Range (THRR);Understanding of Exercise Prescription      Comments Pt is starting rehab today. Too early to making in comments on exercise. Will monitor and progress as able. [name] has completed 5 exercise sessions and has been consistent with workload and MET increases. He is able to do everything independently and has a high exercise capacity. He is exercising at 2.9 METS on the Nustep and 2.97 METS walking the track.      Expected Outcomes Through exercise at rehab and home the patient will decrease shortness of breath with daily activities and feel confident in carrying out an exercise regimn at home. Through exercise at rehab and home the patient will decrease shortness of breath with daily activities and feel confident in carrying out an exercise regimn at home.             Discharge Exercise Prescription (Final Exercise Prescription Changes):  Exercise Prescription Changes - 05/16/20 1208      Response to Exercise   Blood Pressure (Admit) 122/70    Blood Pressure (Exit) 104/60    Heart Rate (Admit) 74 bpm    Heart Rate (Exercise) 93 bpm    Heart Rate (Exit) 83 bpm    Oxygen Saturation (Admit) 99 %    Oxygen Saturation (Exercise) 99 %    Oxygen Saturation (Exit) 98 %    Rating of Perceived Exertion (Exercise) 13    Perceived Dyspnea (Exercise) 1    Duration Progress to 30 minutes of  aerobic without signs/symptoms of physical distress    Intensity THRR unchanged      Progression   Progression Continue to progress workloads to maintain intensity without signs/symptoms of physical distress.      Resistance Training   Training Prescription Yes    Weight orange bands    Reps 10-15    Time 10 Minutes      NuStep   Level 4    SPM 80    Minutes 15    METs 2.8      Track   Laps 18    Minutes 15            Nutrition:  Target Goals: Understanding of nutrition guidelines, daily intake of sodium <1569m, cholesterol <2070m calories 30% from fat and 7% or less from saturated fats, daily to have 5 or more servings of fruits and vegetables.  Biometrics:  Pre Biometrics - 05/01/20 1123      Pre Biometrics   Grip Strength 28 kg  Nutrition Therapy Plan and Nutrition Goals:  Nutrition Therapy & Goals - 06/05/20 0740      Nutrition Therapy   Diet High calorie/Renal (CKD3)    Protein (specify units) 57 g      Personal Nutrition Goals   Nutrition Goal Pt to identify food quantities necessary to achieve weight gain of 0.5 lbs week at while in cardiac rehab program    Personal Goal #2 Pt to meet protein and calorie goals    Personal Goal #3 Pt to choose lower sodium, phosphorus, and potassium foods      Intervention Plan   Intervention Prescribe, educate and counsel regarding individualized specific dietary modifications aiming towards targeted core components such as weight, hypertension, lipid management, diabetes, heart failure and other comorbidities.;Nutrition handout(s) given to patient.    Expected Outcomes Long Term Goal: Adherence to prescribed nutrition plan.;Short Term Goal: A plan has been developed with personal nutrition goals set during dietitian appointment.           Nutrition Assessments:  Nutrition Assessments - 06/05/20 0742      Rate Your Plate Scores   Pre Score 58          MEDIFICTS Score Key:  ?70 Need to make dietary changes   40-70 Heart Healthy Diet  ? 40 Therapeutic Level Cholesterol Diet   Picture Your Plate Scores:  <00 Unhealthy dietary pattern with much room for improvement.  41-50 Dietary pattern unlikely to meet recommendations for good health and room for improvement.  51-60 More healthful dietary pattern, with some room for improvement.   >60 Healthy dietary pattern, although there may be some specific behaviors that  could be improved.    Nutrition Goals Re-Evaluation:  Nutrition Goals Re-Evaluation    [place] Name 06/05/20 0741 06/05/20 0742           Goals   Current Weight 157 lb (71.2 kg) 155 lb 13.8 oz (70.7 kg)      Nutrition Goal Pt to identify food quantities necessary to achieve weight gain of 0.5 lbs week at while in cardiac rehab program -             Personal Goal #2 Re-Evaluation   Personal Goal #2 Pt to meet protein and calorie goals -             Personal Goal #3 Re-Evaluation   Personal Goal #3 Pt to choose lower sodium, phosphorus, and potassium foods -             Nutrition Goals Discharge (Final Nutrition Goals Re-Evaluation):  Nutrition Goals Re-Evaluation - 06/05/20 0742      Goals   Current Weight 155 lb 13.8 oz (70.7 kg)           Psychosocial: Target Goals: Acknowledge presence or absence of significant depression and/or stress, maximize coping skills, provide positive support system. Participant is able to verbalize types and ability to use techniques and skills needed for reducing stress and depression.  Initial Review & Psychosocial Screening:  Initial Psych Review & Screening - 05/01/20 1148      Initial Review   Current issues with None Identified      Family Dynamics   Good Support System? Yes      Barriers   Psychosocial barriers to participate in program There are no identifiable barriers or psychosocial needs.      Screening Interventions   Interventions Encouraged to exercise           Quality of Life Scores:  Scores of 19 and below usually indicate a poorer quality of life in these areas.  A difference of  2-3 points is a clinically meaningful difference.  A difference of 2-3 points in the total score of the Quality of Life Index has been associated with significant improvement in overall quality of life, self-image, physical symptoms, and general health in studies assessing change in quality of life.  PHQ-9: Recent Review Flowsheet Data     Depression screen [facility] 2/9 05/01/2020 04/11/2020   Decreased Interest 0 0   Down, Depressed, Hopeless 0 0   PHQ - 2 Score 0 0   Altered sleeping 0 -   Tired, decreased energy 0 -   Change in appetite 0 -   Feeling bad or failure about yourself  0 -   Trouble concentrating 0 -   Moving slowly or fidgety/restless 0 -   Suicidal thoughts 0 -   Difficult doing work/chores Not difficult at all -     Interpretation of Total Score  Total Score Depression Severity:  1-4 = Minimal depression, 5-9 = Mild depression, 10-14 = Moderate depression, 15-19 = Moderately severe depression, 20-27 = Severe depression   Psychosocial Evaluation and Intervention:  Psychosocial Evaluation - 05/01/20 1154      Psychosocial Evaluation & Interventions   Interventions Encouraged to exercise with the program and follow exercise prescription    Comments No barriers or psychosocial concerns identified today.    Expected Outcomes For [name] to continue to be free of barriers or psychosocial concerns while participating in pulmonary rehab.    Continue Psychosocial Services  No Follow up required           Psychosocial Re-Evaluation:  Psychosocial Re-Evaluation    [place] Name 05/09/20 0903 06/05/20 1009           Psychosocial Re-Evaluation   Current issues with None Identified None Identified      Comments No concerns identified. -      Expected Outcomes For Bernie to be free of psychosocial concerns while participating in pulmonary rehab. For Bernie to continue to be free of psychosocial concerns while participating in pulmonary rehab.      Interventions Encouraged to attend Pulmonary Rehabilitation for the exercise Encouraged to attend Pulmonary Rehabilitation for the exercise      Continue Psychosocial Services  No Follow up required No Follow up required             Psychosocial Discharge (Final Psychosocial Re-Evaluation):  Psychosocial Re-Evaluation - 06/05/20 1009      Psychosocial  Re-Evaluation   Current issues with None Identified    Expected Outcomes For Bernie to continue to be free of psychosocial concerns while participating in pulmonary rehab.    Interventions Encouraged to attend Pulmonary Rehabilitation for the exercise    Continue Psychosocial Services  No Follow up required           Education: Education Goals: Education classes will be provided on a weekly basis, covering required topics. Participant will state understanding/return demonstration of topics presented.  Learning Barriers/Preferences:  Learning Barriers/Preferences - 05/01/20 1156      Learning Barriers/Preferences   Learning Barriers None    Learning Preferences Computer/Internet;Written Material           Education Topics: Risk Factor Reduction:  -Group instruction that is supported by a PowerPoint presentation. Instructor discusses the definition of a risk factor, different risk factors for pulmonary disease, and how the heart and lungs work together.   Flowsheet  Row PULMONARY REHAB OTHER RESPIRATORY from 06/01/2020 in [facility] [place]  Date 06/01/20  Educator Handout      Nutrition for Pulmonary Patient:  -Group instruction provided by PowerPoint slides, verbal discussion, and written materials to support subject matter. The instructor gives an explanation and review of healthy diet recommendations, which includes a discussion on weight management, recommendations for fruit and vegetable consumption, as well as protein, fluid, caffeine, fiber, sodium, sugar, and alcohol. Tips for eating when patients are short of breath are discussed.   Pursed Lip Breathing:  -Group instruction that is supported by demonstration and informational handouts. Instructor discusses the benefits of pursed lip and diaphragmatic breathing and detailed demonstration on how to preform both.     Oxygen Safety:  -Group instruction provided by PowerPoint, verbal discussion, and  written material to support subject matter. There is an overview of "What is Oxygen" and "Why do we need it".  Instructor also reviews how to create a safe environment for oxygen use, the importance of using oxygen as prescribed, and the risks of noncompliance. There is a brief discussion on traveling with oxygen and resources the patient may utilize.   Oxygen Equipment:  -Group instruction provided by [facility] Staff utilizing handouts, written materials, and equipment demonstrations.   Signs and Symptoms:  -Group instruction provided by written material and verbal discussion to support subject matter. Warning signs and symptoms of infection, stroke, and heart attack are reviewed and when to call the physician/911 reinforced. Tips for preventing the spread of infection discussed.   Advanced Directives:  -Group instruction provided by verbal instruction and written material to support subject matter. Instructor reviews Advanced Directive laws and proper instruction for filling out document.   Pulmonary Video:  -Group video education that reviews the importance of medication and oxygen compliance, exercise, good nutrition, pulmonary hygiene, and pursed lip and diaphragmatic breathing for the pulmonary patient.   Exercise for the Pulmonary Patient:  -Group instruction that is supported by a PowerPoint presentation. Instructor discusses benefits of exercise, core components of exercise, frequency, duration, and intensity of an exercise routine, importance of utilizing pulse oximetry during exercise, safety while exercising, and options of places to exercise outside of rehab.     Pulmonary Medications:  -Verbally interactive group education provided by instructor with focus on inhaled medications and proper administration.   Anatomy and Physiology of the Respiratory System and Intimacy:  -Group instruction provided by PowerPoint, verbal discussion, and written material to support subject  matter. Instructor reviews respiratory cycle and anatomical components of the respiratory system and their functions. Instructor also reviews differences in obstructive and restrictive respiratory diseases with examples of each. Intimacy, Sex, and Sexuality differences are reviewed with a discussion on how relationships can change when diagnosed with pulmonary disease. Common sexual concerns are reviewed.   MD DAY -A group question and answer session with a medical doctor that allows participants to ask questions that relate to their pulmonary disease state.   OTHER EDUCATION -Group or individual verbal, written, or video instructions that support the educational goals of the pulmonary rehab program.   Holiday Eating Survival Tips:  -Group instruction provided by PowerPoint slides, verbal discussion, and written materials to support subject matter. The instructor gives patients tips, tricks, and techniques to help them not only survive but enjoy the holidays despite the onslaught of food that accompanies the holidays. Flowsheet Row PULMONARY REHAB OTHER RESPIRATORY from 06/01/2020 in [place]  Date 05/11/20  Educator --  [handout]      Knowledge Questionnaire Score:  Knowledge Questionnaire Score - 05/01/20 1156      Knowledge Questionnaire Score   Pre Score 15/18           Core Components/Risk Factors/Patient Goals at Admission:  Personal Goals and Risk Factors at Admission - 05/01/20 1156      Core Components/Risk Factors/Patient Goals on Admission   Heart Failure Yes    Intervention Provide a combined exercise and nutrition program that is supplemented with education, support and counseling about heart failure. Directed toward relieving symptoms such as shortness of breath, decreased exercise tolerance, and extremity edema.    Expected Outcomes Improve functional capacity of life;Short term: Attendance in program 2-3 days a week with increased  exercise capacity. Reported lower sodium intake. Reported increased fruit and vegetable intake. Reports medication compliance.;Short term: Daily weights obtained and reported for increase. Utilizing diuretic protocols set by physician.;Long term: Adoption of self-care skills and reduction of barriers for early signs and symptoms recognition and intervention leading to self-care maintenance.           Core Components/Risk Factors/Patient Goals Review:   Goals and Risk Factor Review    Row Name 05/01/20 1157 05/09/20 0904 06/05/20 1010         Core Components/Risk Factors/Patient Goals Review   Personal Goals Review Heart Failure Heart Failure Heart Failure     Review - Patient starts exercising today, not able to have met any goals. [name] is amazing for 80 years old, he is knowledgeable re: his heart failure and daily weights. His heart failure is controlled.     Expected Outcomes - For Bernie to increase his strength, stamina, and endurance in the next full 30 days and maintain his heart failure. For Bernie to increase his strength and stamina in the next 30 days.            Core Components/Risk Factors/Patient Goals at Discharge (Final Review):   Goals and Risk Factor Review - 06/05/20 1010      Core Components/Risk Factors/Patient Goals Review   Personal Goals Review Heart Failure    Review [name] is amazing for 80 years old, he is knowledgeable re: his heart failure and daily weights. His heart failure is controlled.    Expected Outcomes For Bernie to increase his strength and stamina in the next 30 days.           ITP Comments:   Comments: ITP REVIEW Pt is making expected progress toward pulmonary rehab goals after completing 5 sessions. Recommend continued exercise, life style modification, education, and utilization of breathing techniques to increase stamina and strength and decrease shortness of breath with exertion.

## 2020-06-06 NOTE — Progress Notes (Signed)
Daily Session Note  Patient Details  Name: David [name] MRN: [id number] Date of Birth: [birth year] Referring Provider:   [name] Pulmonary Rehab Walk Test from 05/01/2020 in [place]  Referring Provider David Champagne, MD      Encounter Date: 06/06/2020  Check In:  Session Check In - 06/06/20 1128      Check-In   Supervising physician immediately available to respond to emergencies [facility] immediately available    Physician(s) Dr. Horris [name]    Location [facility]    Staff Present David Poles, RN, BSN;David Wilber Oliphant, RN, David Laud, MS, ACSM-CEP, Exercise Physiologist    Virtual Visit No    Medication changes reported     No    Fall or balance concerns reported    No    Tobacco Cessation No Change    Warm-up and Cool-down Performed on first and last piece of equipment    Resistance Training Performed Yes    VAD Patient? No    PAD/SET Patient? No      Pain Assessment   Currently in Pain? No/denies    Multiple Pain Sites No           Capillary Blood Glucose: No results found for this or any previous visit (from the past 24 hour(s)).   Exercise Prescription Changes - 06/06/20 1100      Response to Exercise   Blood Pressure (Admit) 100/56    Blood Pressure (Exercise) 110/66    Blood Pressure (Exit) 116/64    Heart Rate (Admit) 86 bpm    Heart Rate (Exercise) 101 bpm    Heart Rate (Exit) 88 bpm    Oxygen Saturation (Admit) 100 %    Oxygen Saturation (Exercise) 99 %    Oxygen Saturation (Exit) 100 %    Rating of Perceived Exertion (Exercise) 13    Perceived Dyspnea (Exercise) 1    Duration Progress to 45 minutes of aerobic exercise without signs/symptoms of physical distress    Intensity THRR unchanged      Progression   Progression Continue to progress workloads to maintain intensity without signs/symptoms of physical distress.      Resistance Training   Training Prescription Yes     Weight orange bands    Reps 10-15    Time 10 Minutes      NuStep   Level 5    SPM 80    Minutes 15    METs 2.9      Track   Laps 18    Minutes 15    METs 3.09           Social History   Tobacco Use  Smoking Status Former Smoker  Smokeless Tobacco Never Used    Goals Met:  Proper associated with RPD/PD & O2 Sat Independence with exercise equipment Exercise tolerated well Strength training completed today  Goals Unmet:  Not Applicable  Comments: Service time is from 1035 to 1137    Dr. Fransico [name] is Medical Director for Cardiac Rehab at [facility].

## 2020-06-07 ENCOUNTER — Encounter: Payer: Medicare HMO | Attending: Cardiology | Admitting: Dietician

## 2020-06-07 DIAGNOSIS — I5022 Chronic systolic (congestive) heart failure: Secondary | ICD-10-CM | POA: Diagnosis present

## 2020-06-07 DIAGNOSIS — N189 Chronic kidney disease, unspecified: Secondary | ICD-10-CM | POA: Insufficient documentation

## 2020-06-07 DIAGNOSIS — Z713 Dietary counseling and surveillance: Secondary | ICD-10-CM | POA: Insufficient documentation

## 2020-06-07 NOTE — Patient Instructions (Signed)
   Goal for protein is about 75-85grams daily from all sources.   Keep sodium to average of 500-600mg  each meal.   Great job making healthy food choices, keep it up!

## 2020-06-07 NOTE — Progress Notes (Signed)
Medical Nutrition Therapy: Visit start time: 1330  end time: 1430  Assessment:  Diagnosis: chronic systolic heart failure Medical history changes: no changes Psychosocial issues/ stress concerns: none  Current weight: 162.1lbs with jacket and shoes Height: 6'0" Medications, supplement changes: reconciled list in medical record  Progress and evaluation:  . Weight has likely increased slightly since previous visit (156.2 on 04/11/20 with lighter clothing). . Patient is following low sodium diet, avoids large portions of protein foods.  . Wife follows FODMAP diet, low in beans, so difficult for patient to eat beans often. [name] He is drinking 1 protein drink daily containing 30g protein, and is drinking over ice with straw to help with palatability. . They have been trying some tofu recipes, patient likes some but not all tofu dishes.  . Patient and wife have investigated Dr. Terrall Laity diet, vegan eating pattern after friend started vegan diet and was able to reverse arterial plaque.   Physical activity: pulmonary rehab 3x a week  Dietary Intake:  Usual eating pattern includes 3 meals and 1 snacks per day. Dining out frequency: not assessed today  Breakfast: oatmeal (1-2x/week); eggs with egg whites; toast cheddar cheese and honey with cottage cheese; dry cereal with milk (1x/week); orange Snack: nutrition supplement-Boost sugar control Lunch: sandwich with ham and cheese lettuce, tomato, onion; soups with added no salt veggies, low/no sodium broth, beans; tuna salad sandwich; chicken salad with pasta  Snack: none Supper: fish (1-2x/week)/beef (1-2x/week)/pork chops/chicken (most nights) with rice and green salad and cooked veg  Snack: none Beverages: water, coffee  Nutrition Care Education: Topics covered:  Basic nutrition: basic food groups, appropriate nutrient balance-- daily protein needs   Weight control: reviewed progress since previous visit, determining healthy body weight Heart  health: Mediterranean diet; reviewed goal for sodium level with meals; discussed potassium restriction only if blood levels are above normal and advised daily minimum of 1600-2000mg   Nutritional Diagnosis:  -2.2 Altered nutrition-related laboratory As related to chronic heart failure, chronic kidney disease.  As evidenced by medical diagnosis and GFR <40.  Intervention:  . Instruction and discussion as noted above. . Patient and wife are monitoring diet closely and continue to make positive changes. . Current goal is to continue with his current eating pattern. . No additional follow-up scheduled, patient to schedule later if needed.  Education Materials given:  [name] Mediterranean diet handout . Protein content in foods . Visit summary with goals/ instructions   Learner/ who was taught:  . Patient  . Spouse/ partner  Level of understanding: [name] Verbalizes/ demonstrates competency   Demonstrated degree of understanding via:   Teach back Learning barriers: . None  Willingness to learn/ readiness for change: . Eager, change in progress   Monitoring and Evaluation:  Dietary intake, exercise, heart and renal health, and body weight      follow up: prn

## 2020-06-08 ENCOUNTER — Encounter (HOSPITAL_COMMUNITY)
Admission: RE | Admit: 2020-06-08 | Discharge: 2020-06-08 | Disposition: A | Discharge status: Home / Self Care | Payer: Medicare HMO | Source: Ambulatory Visit | Attending: Cardiology | Admitting: Cardiology

## 2020-06-08 ENCOUNTER — Other Ambulatory Visit: Payer: Self-pay

## 2020-06-08 DIAGNOSIS — I5022 Chronic systolic (congestive) heart failure: Secondary | ICD-10-CM

## 2020-06-08 NOTE — Progress Notes (Signed)
Daily Session Note  Patient Details  Name: David [name] MRN: [id number] Date of Birth: August 11, 1940 Referring Provider:   April Manson Pulmonary Rehab Walk Test from 05/01/2020 in [name]  Referring Provider Loralie Champagne, MD      Encounter Date: 06/08/2020  Check In:  Session Check In - 06/08/20 1101      Check-In   Supervising physician immediately available to respond to emergencies [facility] immediately available    Physician(s) Dr. Starla Link    [facility]    Staff Present Rosebud Poles, RN, Isaac Laud, MS, ACSM-CEP, Exercise Physiologist    Virtual Visit No    Medication changes reported     No    Fall or balance concerns reported    No    Tobacco Cessation No Change    Warm-up and Cool-down Performed on first and last piece of equipment    Resistance Training Performed Yes    VAD Patient? No    PAD/SET Patient? No      Pain Assessment   Currently in Pain? No/denies    Multiple Pain Sites No           Capillary Blood Glucose: No results found for this or any previous visit (from the past 24 hour(s)).    Social History   Tobacco Use  Smoking Status Former Smoker  Smokeless Tobacco Never Used    Goals Met:  Proper associated with RPD/PD & O2 Sat Exercise tolerated well No report of cardiac concerns or symptoms Strength training completed today  Goals Unmet:  Not Applicable  Comments: Service time is from 1030 to [place]    Dr. Fransico Him is Medical Director for Cardiac Rehab at [facility].

## 2020-06-13 ENCOUNTER — Encounter (HOSPITAL_COMMUNITY)
Admission: RE | Admit: 2020-06-13 | Discharge: 2020-06-13 | Disposition: A | Discharge status: Home / Self Care | Payer: Medicare HMO | Source: Ambulatory Visit | Attending: Cardiology | Admitting: Cardiology

## 2020-06-13 ENCOUNTER — Other Ambulatory Visit: Payer: Self-pay

## 2020-06-13 DIAGNOSIS — I5022 Chronic systolic (congestive) heart failure: Secondary | ICD-10-CM | POA: Diagnosis not present

## 2020-06-13 NOTE — Progress Notes (Signed)
Daily Session Note  Patient Details  Name: David [name] MRN: [id number] Date of Birth: [birth year] Referring Provider:   April Manson Pulmonary Rehab Walk Test from 05/01/2020 in [place]  Referring Provider Loralie Champagne, MD      Encounter Date: 06/13/2020  Check In:  Session Check In - 06/13/20 1039      Check-In   Supervising physician immediately available to respond to emergencies [facility] immediately available    Physician(s) Dr. Starla Link    [facility]    Staff Present Rosebud Poles, RN, Isaac Laud, MS, ACSM-CEP, Exercise Physiologist    Virtual Visit No    Medication changes reported     No    Fall or balance concerns reported    No    Tobacco Cessation No Change    Warm-up and Cool-down Performed on first and last piece of equipment    Resistance Training Performed Yes    VAD Patient? No    PAD/SET Patient? No      Pain Assessment   Currently in Pain? No/denies    Multiple Pain Sites No           Capillary Blood Glucose: No results found for this or any previous visit (from the past 24 hour(s)).    Social History   Tobacco Use  Smoking Status Former Smoker  Smokeless Tobacco Never Used    Goals Met:  Proper associated with RPD/PD & O2 Sat Independence with exercise equipment Exercise tolerated well Strength training completed today  Goals Unmet:  Not Applicable  Comments: Service time is from 1040 to 1142    Dr. Fransico Him is Medical Director for Cardiac Rehab at [facility].

## 2020-06-13 NOTE — Progress Notes (Signed)
I have reviewed a Home Exercise Prescription with David [name] . David [name] is currently exercising at home. Pt is currently riding his recumbent bike, walking, and doing resistance training 3 days per week. Pt was advised to continue riding his bike, walking, and resistance training 3 additional days a week for 45-60 minutes.  David [name] and I discussed how to progress their exercise prescription.  The patient stated that their goals were to live longer and have less chronic pain.  The patient stated that they understand the exercise prescription.  We reviewed exercise guidelines, target heart rate during exercise, RPE Scale, weather conditions, Rescue inhaler use, endpoints for exercise, warmup and cool down.  Patient is encouraged to come to me with any questions. I will continue to follow up with the patient to assist them with progression and safety.

## 2020-06-15 ENCOUNTER — Encounter (HOSPITAL_COMMUNITY): Payer: Self-pay | Admitting: Cardiology

## 2020-06-15 ENCOUNTER — Other Ambulatory Visit: Payer: Self-pay

## 2020-06-15 ENCOUNTER — Encounter (HOSPITAL_COMMUNITY)
Admission: RE | Admit: 2020-06-15 | Discharge: 2020-06-15 | Disposition: A | Discharge status: Home / Self Care | Payer: Medicare HMO | Source: Ambulatory Visit | Attending: Cardiology | Admitting: Cardiology

## 2020-06-15 ENCOUNTER — Ambulatory Visit (HOSPITAL_COMMUNITY)
Admission: RE | Admit: 2020-06-15 | Discharge: 2020-06-15 | Disposition: A | Discharge status: Home / Self Care | Payer: Medicare HMO | Source: Ambulatory Visit | Attending: Cardiology | Admitting: Cardiology

## 2020-06-15 VITALS — BP 100/56 | HR 69 | Wt 161.6 lb

## 2020-06-15 DIAGNOSIS — Z87891 Personal history of nicotine dependence: Secondary | ICD-10-CM | POA: Diagnosis not present

## 2020-06-15 DIAGNOSIS — E1142 Type 2 diabetes mellitus with diabetic polyneuropathy: Secondary | ICD-10-CM | POA: Diagnosis not present

## 2020-06-15 DIAGNOSIS — I350 Nonrheumatic aortic (valve) stenosis: Secondary | ICD-10-CM | POA: Diagnosis not present

## 2020-06-15 DIAGNOSIS — Z7901 Long term (current) use of anticoagulants: Secondary | ICD-10-CM | POA: Insufficient documentation

## 2020-06-15 DIAGNOSIS — I2510 Atherosclerotic heart disease of native coronary artery without angina pectoris: Secondary | ICD-10-CM | POA: Diagnosis not present

## 2020-06-15 DIAGNOSIS — E1122 Type 2 diabetes mellitus with diabetic chronic kidney disease: Secondary | ICD-10-CM | POA: Diagnosis not present

## 2020-06-15 DIAGNOSIS — Z8249 Family history of ischemic heart disease and other diseases of the circulatory system: Secondary | ICD-10-CM | POA: Diagnosis not present

## 2020-06-15 DIAGNOSIS — I5032 Chronic diastolic (congestive) heart failure: Secondary | ICD-10-CM | POA: Diagnosis not present

## 2020-06-15 DIAGNOSIS — I130 Hypertensive heart and chronic kidney disease with heart failure and stage 1 through stage 4 chronic kidney disease, or unspecified chronic kidney disease: Secondary | ICD-10-CM | POA: Insufficient documentation

## 2020-06-15 DIAGNOSIS — Z79899 Other long term (current) drug therapy: Secondary | ICD-10-CM | POA: Insufficient documentation

## 2020-06-15 DIAGNOSIS — Z833 Family history of diabetes mellitus: Secondary | ICD-10-CM | POA: Diagnosis not present

## 2020-06-15 DIAGNOSIS — I442 Atrioventricular block, complete: Secondary | ICD-10-CM | POA: Diagnosis not present

## 2020-06-15 DIAGNOSIS — I5022 Chronic systolic (congestive) heart failure: Secondary | ICD-10-CM | POA: Diagnosis not present

## 2020-06-15 DIAGNOSIS — Z950 Presence of cardiac pacemaker: Secondary | ICD-10-CM | POA: Insufficient documentation

## 2020-06-15 DIAGNOSIS — I480 Paroxysmal atrial fibrillation: Secondary | ICD-10-CM | POA: Diagnosis not present

## 2020-06-15 DIAGNOSIS — N183 Chronic kidney disease, stage 3 unspecified: Secondary | ICD-10-CM | POA: Insufficient documentation

## 2020-06-15 LAB — BASIC METABOLIC PANEL
Anion gap: 10 (ref 5–15)
BUN: 37 mg/dL — ABNORMAL HIGH (ref 8–23)
CO2: 27 mmol/L (ref 22–32)
Calcium: 9 mg/dL (ref 8.9–10.3)
Chloride: 100 mmol/L (ref 98–111)
Creatinine, Ser: 1.41 mg/dL — ABNORMAL HIGH (ref 0.61–1.24)
GFR, Estimated: 50 mL/min — ABNORMAL LOW (ref >60)
Glucose, Bld: 101 mg/dL — ABNORMAL HIGH (ref 70–99)
Potassium: 4.6 mmol/L (ref 3.5–5.1)
Sodium: 137 mmol/L (ref 135–145)

## 2020-06-15 LAB — CBC
HCT: 40.3 % (ref 39.0–52.0)
Hemoglobin: 13.7 g/dL (ref 13.0–17.0)
MCH: 32.9 pg (ref 26.0–34.0)
MCHC: 34 g/dL (ref 30.0–36.0)
MCV: 96.9 fL (ref 80.0–100.0)
Platelets: 152 10*3/uL (ref 150–400)
RBC: 4.16 MIL/uL — ABNORMAL LOW (ref 4.22–5.81)
RDW: 13.7 % (ref 11.5–15.5)
WBC: 7.5 10*3/uL (ref 4.0–10.5)
nRBC: 0 % (ref 0.0–0.2)

## 2020-06-15 LAB — MAGNESIUM: Magnesium: 2.7 mg/dL — ABNORMAL HIGH (ref 1.7–2.4)

## 2020-06-15 MED ORDER — TORSEMIDE 20 MG PO TABS: 30.0000 mg | ORAL_TABLET | Freq: Every day | ORAL | 3 refills | Status: AC

## 2020-06-15 NOTE — Progress Notes (Signed)
Daily Session Note  Patient Details  Name: David [name] MRN: [id number] Date of Birth: 09-May-1941 Referring Provider:   April Manson Pulmonary Rehab Walk Test from 05/01/2020 in [place]  Referring Provider Loralie Champagne, MD      Encounter Date: 06/15/2020  Check In:  Session Check In - 06/15/20 1048      Check-In   Supervising physician immediately available to respond to emergencies [facility] immediately available    Physician(s) Dr. Florencia Reasons    [facility]    Staff Present Trish Fountain, RN, BSN;Other;Jessica Hassell Done, MS, ACSM-CEP, Exercise Physiologist    Virtual Visit No    Medication changes reported     No    Fall or balance concerns reported    No    Tobacco Cessation No Change    Warm-up and Cool-down Performed on first and last piece of equipment    Resistance Training Performed Yes    VAD Patient? No    PAD/SET Patient? No      Pain Assessment   Currently in Pain? No/denies    Multiple Pain Sites No           Capillary Blood Glucose: No results found for this or any previous visit (from the past 24 hour(s)).    Social History   Tobacco Use  Smoking Status Former Smoker  Smokeless Tobacco Never Used    Goals Met:  Proper associated with RPD/PD & O2 Sat Independence with exercise equipment Exercise tolerated well Strength training completed today  Goals Unmet:  Not Applicable  Comments: Service time is from 1038 to 1144   Dr. Fransico Him is Medical Director for Cardiac Rehab at [facility].

## 2020-06-15 NOTE — Patient Instructions (Signed)
Change Torsemide to 30 mg (1 & 1/2 tabs) Daily  Labs done today, your results will be available in MyChart, we will contact you for abnormal readings.  Your physician recommends that you schedule a follow-up appointment in: 6 weeks  If you have any questions or concerns before your next appointment please send [place] a message through [place] or call our office at [phone].    TO LEAVE A MESSAGE FOR THE NURSE SELECT OPTION 2, PLEASE LEAVE A MESSAGE INCLUDING: . YOUR NAME . DATE OF BIRTH . CALL BACK NUMBER . REASON FOR CALL**this is important as we prioritize the call backs  [place] AS LONG AS YOU CALL BEFORE 4:00 PM  At the [facility], you and your health needs are our priority. As part of our continuing mission to provide you with exceptional heart care, we have created designated Provider Care Teams. These Care Teams include your primary Cardiologist (physician) and Advanced Practice Providers (APPs- Physician Assistants and Nurse Practitioners) who all work together to provide you with the care you need, when you need it.   You may see any of the following providers on your designated Care Team at your next follow up: [name] Dr Glori Bickers . Dr Loralie Champagne . Darrick Grinder, NP . Lyda Jester, PA . Audry Riles, PharmD   Please be sure to bring in all your medications bottles to every appointment.

## 2020-06-15 NOTE — Progress Notes (Signed)
PCP: Lajean Manes, MD Cardiology: Dr. Irish Lack HF Cardiology: Dr. Aundra Dubin  80 y.o. with history of complete heart block, CKD stage 3, paroxysmal atrial fibrillation, CAD, and chronic systolic CHF was referred by Dr. Irish Lack for evaluation of possible cardiac amyloidosis.  In 1999, he had LAD atherectomy.  Patient developed complete heart block in 2012 with PPM placement.  He had a stress PET done at that time that was not suggestive of ischemia.   Paroxysmal atrial fibrillation was noted during device interrogation and apixaban was started.  Echo in 12/19 was noted to show EF 45-50% with severe LVH.    Echo was done again in 12/20, showing EF 35-40%, severe LVH with concern for amyloidosis, moderate RV dysfunction, low gradient moderate AS, IVC dilated.  Given concern for cardiac amyloidosis, urine immunofixation was done and was negative, and serum myeloma panel was done and was negative.  PYP scan was done and was suggestive of cardiac amyloidosis.  TTR amyloidosis genetic testing was negative, suggesting wild type disease.   Cardiolite in 2/21 showed EF 40%, no ischemia or infarction.  Echo in 8/21 showed EF 35-40%, moderate LVH, normal RV, mild AS.   He returns for followup of CHF.  Has tingling and numbness in feet consistent with neuropathy.  He is doing pulmonary rehab.  Breathing is stable.  He gets tired and mildly short of breath walking up hills.  No problems on flat ground.  No chest pain.  No lightheadedness/syncope.  He and his wife are going to be moving to [place], [place] in 3/22.   ECG (personally reviewed): A- BiV paced   St Jude device interrogation: Stable thoracic impedance, no VT/AF.   Labs (11/20): K 4.7, creatinine 1.12 Labs (12/20): Urine immunofixation and serum myeloma panel negative.  Labs (1/21): K 3.6, creatinine 1.65, mildly increased Kappa/Lamba free light chain ratio Labs (2/21): K 4.1, creatinine 1.89, LDL 63, HDL 52 Labs (3/21): K 4, creatinine 1.81 Labs  (4/21): K 4.7, creatinine 1.53 Labs (8/21): K 3.9, creatinine 1.58, TSH mildly elevated but free T3 and T4 normal Labs (10/21): K 4, creatinine 1.61  PMH: 1. Complete heart block: PPM placed 11/12.  2. CKD: Stage 3.  3. Type 2 diabetes  4. OSA 5. H/o spinal stenosis 6. HTN 7. Atrial fibrillation: Paroxysmal.  8. Gibert's disease 9. Aortic stenosis: Echo in 12/20 with low gradient moderate AS. .  10. CAD: LAD atherectomy in 1999.  - Cardiolite (2/21): EF 40%, no ischemia or infarction.  11. Chronic systolic CHF: St Jude CRT-P device upgrade.  - Echo (12/20): EF 35-40%, severe LVH, looks like amyloidosis, moderately decreased RV systolic function, mild MR, low gradient moderate AS (10 mmHg, 1.1 cm^2), IVC dilated.  - TTR amyloidosis: PYP scan 12/20 with intense uptake in myocardium, H/CL ratio 1.67 with grade 3 uptake visually. Urine immunofixation and serum myeloma panel negative.  Gene testing for TTR was negative suggesting wild type disease.  - Echo (8/21): EF 35-40%, moderate LVH, normal RV, severe RAE, moderate LAE, IVC normal, mild AS.   Social History   Socioeconomic History  . Marital status: Married    Spouse name: Not on file  . Number of children: Not on file  . Years of education: Not on file  . Highest education level: Not on file  Occupational History  . Not on file  Tobacco Use  . Smoking status: Former Research scientist (life sciences)  . Smokeless tobacco: Never Used  Vaping Use  . Vaping Use: Never used  Substance and  Sexual Activity  . Alcohol use: Yes    Comment: occasional glass of wine  . Drug use: No  . Sexual activity: Not on file  Other Topics Concern  . Not on file  Social History Narrative  . Not on file   Social Determinants of Health   Financial Resource Strain: Not on file  Food Insecurity: Not on file  Transportation Needs: Not on file  Physical Activity: Not on file  Stress: Not on file  Social Connections: Not on file  Intimate Partner Violence: Not on file    Family History  Problem Relation Age of Onset  . Cancer Mother        Small cell lung cancer  . Diabetes Father        DM  . CAD Father   . Hypertension Father   . Heart disease Father   . Heart attack Father   . Hypertension Sister    ROS: All systems reviewed and negative except as per HPI.   BP (!) 100/56   Pulse 69   Wt 73.3 kg (161 lb 9.6 oz)   SpO2 100%   BMI 22.54 kg/m  General: NAD Neck: No JVD, no thyromegaly or thyroid nodule.  Lungs: Clear to auscultation bilaterally with normal respiratory effort. CV: Nondisplaced PMI.  Heart regular S1/S2, no S3/S4, 1/6 SEM RUSB.  Trace ankle edema.  No carotid bruit.  Normal pedal pulses.  Abdomen: Soft, nontender, no hepatosplenomegaly, no distention.  Skin: Intact without lesions or rashes.  Neurologic: Alert and oriented x 3.  Psych: Normal affect. Extremities: No clubbing or cyanosis.  HEENT: Normal.   Assessment/Plan: 1. Chronic systolic CHF: Echo from 40/10 was reviewed, showed EF around 35-40% with severe LVH with appearance concerning for cardiac amyloidosis, moderate RV dysfunction, low gradient moderate AS.  He has renal dysfunction and symptoms consistent with peripheral neuropathy.  He has also had cardiac conduction abnormalities.  Presentation is concerning for cardiac amyloidosis, with slow progressive more suggestive of ATTR amyloidosis.  Myeloma panel and urine immunofixation negative so AL amyloidosis unlikely. The Kappa/Lambda light chain ratio was mildly elevated, but reviewed this with Dr Irene Limbo and suspect slightly abnormal serum K/L free light ratios likely from his renal insufficiency with differential light chain excretion.  PYP scan was reviewed and markedly positive, suggesting ATTR amyloidosis. Negative ATTR gene testing suggests wild type disease ATTR amyloidosis. He has history of CAD but no recent chest pain, Cardiolite in 2/21 was negative.  Echo in 8/21 with EF 35-40%, moderate LVH, normal RV.  He has a  Physiological scientist.  NYHA class II symptoms now, volume status currently ok on exam and by Corvue. - With soft BP, will keep Toprol XL at 12.5 mg daily and spironolactone at 12.5 mg daily and have him take these meds at bedtime.  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  - Continue Farxiga 10 mg daily.  - He wants to take the same dose of torsemide every day, will change to 30 mg daily.  BMET today.     - No BP room for Entresto.    - Encouraged regular exercise.  2. CAD: H/o LAD atherectomy in 1999.  No chest pain. I think progressive CAD is unlikely as cause of worsening LV function, Cardiolite in 2/21 was normal.  - He is on Eliquis so no ASA.  - Continue statin.  3. Atrial fibrillation: Paroxysmal. NSR today.  - Continue apixaban, using 2.5 mg  bid dosing with creatinine > 1.5 and age > 44. Check CBC today.  4. CKD: Stage 3.  BMET today.  5. Aortic stenosis: Mild on 8/21 echo.  6. Peripheral neuropathy: Suspect amyloid-related, prominent balance difficulty.  He has diet controlled diabetes so unlikely diabetic neuropathy.  7. Complete heart block: St Jude CRT-P system.   Followup in 6 wks   Loralie Champagne 06/15/2020

## 2020-06-20 ENCOUNTER — Encounter (HOSPITAL_COMMUNITY)
Admission: RE | Admit: 2020-06-20 | Discharge: 2020-06-20 | Disposition: A | Discharge status: Home / Self Care | Payer: Medicare HMO | Source: Ambulatory Visit | Attending: Cardiology | Admitting: Cardiology

## 2020-06-20 ENCOUNTER — Other Ambulatory Visit: Payer: Self-pay

## 2020-06-20 VITALS — Wt 163.6 lb

## 2020-06-20 DIAGNOSIS — I5022 Chronic systolic (congestive) heart failure: Secondary | ICD-10-CM

## 2020-06-22 ENCOUNTER — Encounter (HOSPITAL_COMMUNITY)
Admission: RE | Admit: 2020-06-22 | Discharge: 2020-06-22 | Disposition: A | Discharge status: Home / Self Care | Payer: Medicare HMO | Source: Ambulatory Visit | Attending: Cardiology | Admitting: Cardiology

## 2020-06-22 ENCOUNTER — Other Ambulatory Visit: Payer: Self-pay

## 2020-06-22 DIAGNOSIS — I5022 Chronic systolic (congestive) heart failure: Secondary | ICD-10-CM | POA: Diagnosis not present

## 2020-06-22 NOTE — Progress Notes (Signed)
Daily Session Note  Patient Details  Name: David [name] MRN: [id number] Date of Birth: 05-17-1941 Referring Provider:   April Manson Pulmonary Rehab Walk Test from 05/01/2020 in [place]  Referring Provider Loralie Champagne, MD      Encounter Date: 06/22/2020  Check In:  Session Check In - 06/22/20 1143      Check-In   Supervising physician immediately available to respond to emergencies [facility] immediately available    Physician(s) Dr. Antonieta Pert    Location [facility]    Staff Present Rosebud Poles, RN, BSN;Carlette Wilber Oliphant, RN, Isaac Laud, MS, ACSM-CEP, Exercise Physiologist    Virtual Visit No    Medication changes reported     No    Fall or balance concerns reported    No    Tobacco Cessation No Change    Warm-up and Cool-down Performed on first and last piece of equipment    Resistance Training Performed Yes    VAD Patient? No    PAD/SET Patient? No      Pain Assessment   Currently in Pain? No/denies    Multiple Pain Sites No           Capillary Blood Glucose: No results found for this or any previous visit (from the past 24 hour(s)).    Social History   Tobacco Use  Smoking Status Former Smoker  Smokeless Tobacco Never Used    Goals Met:  Proper associated with RPD/PD & O2 Sat Exercise tolerated well Strength training completed today  Goals Unmet:  Not Applicable  Comments: Service time is from 1045 to 1147.    Dr. Fransico Him is Medical Director for Cardiac Rehab at [facility].

## 2020-06-27 ENCOUNTER — Ambulatory Visit (INDEPENDENT_AMBULATORY_CARE_PROVIDER_SITE_OTHER): Payer: Medicare HMO

## 2020-06-27 ENCOUNTER — Encounter (HOSPITAL_COMMUNITY)
Admission: RE | Admit: 2020-06-27 | Discharge: 2020-06-27 | Disposition: A | Discharge status: Home / Self Care | Payer: Medicare HMO | Source: Ambulatory Visit | Attending: Cardiology | Admitting: Cardiology

## 2020-06-27 ENCOUNTER — Other Ambulatory Visit: Payer: Self-pay

## 2020-06-27 DIAGNOSIS — I5022 Chronic systolic (congestive) heart failure: Secondary | ICD-10-CM | POA: Insufficient documentation

## 2020-06-27 DIAGNOSIS — I5032 Chronic diastolic (congestive) heart failure: Secondary | ICD-10-CM | POA: Diagnosis not present

## 2020-06-27 DIAGNOSIS — Z950 Presence of cardiac pacemaker: Secondary | ICD-10-CM | POA: Diagnosis not present

## 2020-06-27 NOTE — Progress Notes (Signed)
Daily Session Note  Patient Details  Name: David [name] MRN: [id number] Date of Birth: Feb 09, 1941 Referring Provider:   April Manson Pulmonary Rehab Walk Test from 05/01/2020 in [place]  Referring Provider Loralie Champagne, MD      Encounter Date: 06/27/2020  Check In:  Session Check In - 06/27/20 1123      Check-In   Supervising physician immediately available to respond to emergencies [facility] immediately available    Physician(s) Dr. Antonieta Pert    Location [facility]    Staff Present Rosebud Poles, RN, BSN;Carlette Wilber Oliphant, RN, Isaac Laud, MS, ACSM-CEP, Exercise Physiologist    Virtual Visit No    Medication changes reported     No    Fall or balance concerns reported    No    Tobacco Cessation No Change    Warm-up and Cool-down Performed on first and last piece of equipment    Resistance Training Performed Yes    VAD Patient? No    PAD/SET Patient? No      Pain Assessment   Currently in Pain? No/denies    Multiple Pain Sites No           Capillary Blood Glucose: No results found for this or any previous visit (from the past 24 hour(s)).    Social History   Tobacco Use  Smoking Status Former Smoker  Smokeless Tobacco Never Used    Goals Met:  Proper associated with RPD/PD & O2 Sat Exercise tolerated well No report of cardiac concerns or symptoms  Goals Unmet:  Not Applicable  Comments: Service time is from 1045 to 1120 Patient completed pulmonary rehab program today and made improvements on post 6 minute walk test.     Dr. Fransico Him is Medical Director for Cardiac Rehab at [facility].

## 2020-06-29 ENCOUNTER — Encounter (HOSPITAL_COMMUNITY): Payer: Medicare HMO

## 2020-06-30 ENCOUNTER — Telehealth: Payer: Self-pay

## 2020-06-30 NOTE — Progress Notes (Signed)
EPIC Encounter for ICM Monitoring  Patient Name: David [name] is a 80 y.o. male Date: 06/30/2020 Primary Care Physican: Lajean Manes, MD Primary Cardiologist:[place] Electrophysiologist:Taylor Bi-V Pacing:>99% 12/15/2021Weight:155lbs  AT/AF Burden:<1% (taking Eliquis)   Attempted call to patient and unable to reach.  Left detailed message per DPR regarding transmission. Transmission reviewed.   CorvueThoracic impedancenormal but was suggesting possible fluid accumulation from 06/16/2020 - 06/25/2020.  Prescribed:   Torsemide20mg Take 1.5 tablets (430 mg total) by mouth daily.   Potassium 20 mEq take1tablet (54mEq total)by mouth daily  Spironolactone 25 mg take0.5tablet (12.5 mg total)at bedtime  Labs: 06/15/2020 Creatinine 1.41, BUN 37, Potassium 4.6, Sodium 137, GFR 50 03/21/2020 Creatinine 1.61, BUN 49, Potassium 4.0, Sodium 136, GFR 40 01/05/2020 Creatinine 1.58, BUN 43, Potassium 3.9, Sodium 137, GFR 40-47 12/14/2019 Creatinine1.58, BUN42, Potassium4.4, Sodium136, GFR40-47 09/14/2019 Creatinine1.53, BUN40, Potassium4.7, [id number], BHA19-37  07/28/2019 Creatinine1.81, BUN53, Potassium4.0, Sodium135, GFR34-40  07/19/2019 Creatinine1.89, BUN44, Potassium4.1, [id number], [id number] A complete set of results can be found in Results Review.  Recommendations: Left voice mail with ICM number and encouraged to call if experiencing any fluid symptoms.  Follow-up plan: ICM clinic phone appointment on3/12/2020. 91 day device clinic remote transmission3/11/2020.  EP/Cardiology Office Visits: Recall for 08/11/2020 with Dr Lovena Le.   Copy of ICM check sent to Dr.Taylor.   3 month ICM trend: 06/27/2020.    1 Year ICM trend:       [name], RN 06/30/2020 10:44 AM

## 2020-06-30 NOTE — Telephone Encounter (Signed)
Remote ICM transmission received.  Attempted call to patient regarding ICM remote transmission and left detailed message per DPR.  Advised to return call for any fluid symptoms or questions. Next ICM remote transmission scheduled 08/01/2020.

## 2020-07-01 ENCOUNTER — Other Ambulatory Visit (HOSPITAL_COMMUNITY): Payer: Self-pay | Admitting: Cardiology

## 2020-07-04 ENCOUNTER — Encounter (HOSPITAL_COMMUNITY): Payer: Medicare HMO

## 2020-07-06 ENCOUNTER — Encounter (HOSPITAL_COMMUNITY): Payer: Medicare HMO

## 2020-07-12 ENCOUNTER — Other Ambulatory Visit (HOSPITAL_COMMUNITY): Payer: Self-pay

## 2020-07-12 MED ORDER — VYNDAMAX 61 MG PO CAPS: 61.0000 mg | ORAL_CAPSULE | Freq: Every day | ORAL | 3 refills | Status: DC

## 2020-07-20 NOTE — Addendum Note (Signed)
Encounter addended by: Lance Morin, RN on: 07/20/2020 3:58 PM  Actions taken: Clinical Note Signed, Episode resolved

## 2020-07-20 NOTE — Progress Notes (Signed)
Discharge Progress Report  Patient Details  Name: David [name] MRN: [id number] Date of Birth: 1941-05-07 Referring Provider:   April Manson Pulmonary Rehab Walk Test from 05/01/2020 in [place]  Referring Provider Loralie Champagne, MD       Number of Visits: 12  Reason for Discharge:  Patient reached a stable level of exercise. Patient independent in their exercise. Patient has met program and personal goals.  Smoking History:  Social History   Tobacco Use  Smoking Status Former Smoker  Smokeless Tobacco Never Used    Diagnosis:  Heart failure, chronic systolic ([place])  ADL UCSD:  Pulmonary Assessment Scores    Row Name 05/01/20 1148 06/27/20 1203 07/06/20 1236     ADL UCSD   ADL Phase Entry Exit Exit   SOB Score total 31 -- 32     CAT Score   CAT Score 13 -- 17     mMRC Score   mMRC Score 1 1 1           Initial Exercise Prescription:  Initial Exercise Prescription - 05/01/20 1200      Date of Initial Exercise RX and Referring Provider   Date 05/08/20    Referring Provider Loralie Champagne, MD    Expected Discharge Date 07/07/19      NuStep   Level 2    SPM 75    Minutes 25    METs 2      Prescription Details   Frequency (times per week) 2    Duration Progress to 30 minutes of continuous aerobic without signs/symptoms of physical distress      Intensity   THRR 40-80% of Max Heartrate 56-111    Ratings of Perceived Exertion 11-13    Perceived Dyspnea 0-4      Progression   Progression Continue progressive overload as per policy without signs/symptoms or physical distress.      Resistance Training   Training Prescription Yes    Weight Orange bands           Discharge Exercise Prescription (Final Exercise Prescription Changes):  Exercise Prescription Changes - 06/20/20 1500      Response to Exercise   Blood Pressure (Admit) 110/60    Blood Pressure (Exercise) 104/60    Blood Pressure (Exit) 100/50    Heart  Rate (Admit) 72 bpm    Heart Rate (Exercise) 93 bpm    Heart Rate (Exit) 73 bpm    Oxygen Saturation (Admit) 99 %    Oxygen Saturation (Exercise) 90 %    Oxygen Saturation (Exit) 99 %    Rating of Perceived Exertion (Exercise) 13    Perceived Dyspnea (Exercise) 2    Duration Continue with 30 min of aerobic exercise without signs/symptoms of physical distress.    Intensity THRR unchanged      Progression   Progression Continue to progress workloads to maintain intensity without signs/symptoms of physical distress.      Resistance Training   Training Prescription Yes    Weight orange bands    Reps 10-15    Time 10 Minutes      NuStep   Level 6    SPM 80    Minutes 15    METs 5.4      Track   Laps 15    Minutes 15           Functional Capacity:  6 Minute Walk    Row Name 05/01/20 1150 06/27/20 1157  6 Minute Walk   Phase Initial Discharge    Distance 1215 feet 1472 feet    Distance % Change -- 21.15 %    Distance Feet Change -- 257 ft    Walk Time 6 minutes 6 minutes    # of Rest Breaks 0 0    MPH 2.3 2.79    METS 2.6 3.12    RPE 14 13    Perceived Dyspnea  2 2    VO2 Peak 9 10.91    Symptoms Yes (comment) Yes (comment)    Comments Pt c/o bilateral leg fatigue, denies pain leg fatigue and weakness    Resting HR 74 bpm 70 bpm    Resting BP 114/62 114/60    Resting Oxygen Saturation  98 % 100 %    Exercise Oxygen Saturation  during 6 min walk 98 % 100 %    Max Ex. HR 93 bpm 102 bpm    Max Ex. BP 138/70 136/74    2 Minute Post BP 124/70 124/64         Interval HR   1 Minute HR 80 100    2 Minute HR 79 102    3 Minute HR 86 98    4 Minute HR 88 100    5 Minute HR 89 100    6 Minute HR 93 98    2 Minute Post HR 71 70    Interval Heart Rate? Yes Yes         Interval Oxygen   Interval Oxygen? Yes Yes    Baseline Oxygen Saturation % 98 % 100 %    1 Minute Oxygen Saturation % 97 % 100 %    1 Minute Liters of Oxygen 0 L 0 L    2 Minute Oxygen  Saturation % 98 % 99 %    2 Minute Liters of Oxygen 0 L 0 L    3 Minute Oxygen Saturation % 98 % 100 %    3 Minute Liters of Oxygen 0 L 0 L    4 Minute Oxygen Saturation % 97 % 100 %    4 Minute Liters of Oxygen 0 L 0 L    5 Minute Oxygen Saturation % 98 % 98 %    5 Minute Liters of Oxygen 0 L 0 L    6 Minute Oxygen Saturation % 98 % 98 %    6 Minute Liters of Oxygen 0 L 0 L    2 Minute Post Oxygen Saturation % 99 % 100 %    2 Minute Post Liters of Oxygen 0 L 0 L           Psychological, QOL, Others - Outcomes: PHQ 2/9: Depression screen [facility] 2/9 06/27/2020 05/01/2020 04/11/2020  Decreased Interest 0 0 0  Down, Depressed, Hopeless 0 0 0  PHQ - 2 Score 0 0 0  Altered sleeping 0 0 -  Tired, decreased energy 0 0 -  Change in appetite 0 0 -  Feeling bad or failure about yourself  0 0 -  Trouble concentrating 0 0 -  Moving slowly or fidgety/restless 0 0 -  Suicidal thoughts 0 0 -  PHQ-9 Score 0 - -  Difficult doing work/chores Not difficult at all Not difficult at all -  Some recent data might be hidden    Quality of Life:   Personal Goals: Goals established at orientation with interventions provided to work toward goal.  Personal Goals and Risk Factors at Admission -  05/01/20 1156      Core Components/Risk Factors/Patient Goals on Admission   Heart Failure Yes    Intervention Provide a combined exercise and nutrition program that is supplemented with education, support and counseling about heart failure. Directed toward relieving symptoms such as shortness of breath, decreased exercise tolerance, and extremity edema.    Expected Outcomes Improve functional capacity of life;Short term: Attendance in program 2-3 days a week with increased exercise capacity. Reported lower sodium intake. Reported increased fruit and vegetable intake. Reports medication compliance.;Short term: Daily weights obtained and reported for increase. Utilizing diuretic protocols set by physician.;Long term:  Adoption of self-care skills and reduction of barriers for early signs and symptoms recognition and intervention leading to self-care maintenance.            Personal Goals Discharge:  Goals and Risk Factor Review    Row Name 05/01/20 1157 05/09/20 0904 06/05/20 1010         Core Components/Risk Factors/Patient Goals Review   Personal Goals Review Heart Failure Heart Failure Heart Failure     Review -- Patient starts exercising today, not able to have met any goals. [name] is amazing for 80 years old, he is knowledgeable re: his heart failure and daily weights. His heart failure is controlled.     Expected Outcomes -- For Bernie to increase his strength, stamina, and endurance in the next full 30 days and maintain his heart failure. For Bernie to increase his strength and stamina in the next 30 days.            Exercise Goals and Review:   Exercise Goals Re-Evaluation:  Exercise Goals Re-Evaluation    Row Name 05/09/20 0848 06/06/20 0742           Exercise Goal Re-Evaluation   Exercise Goals Review Increase Physical Activity;Increase Strength and Stamina;Able to understand and use rate of perceived exertion (RPE) scale;Able to understand and use Dyspnea scale;Knowledge and understanding of Target Heart Rate Range (THRR);Understanding of Exercise Prescription Increase Physical Activity;Increase Strength and Stamina;Able to understand and use rate of perceived exertion (RPE) scale;Able to understand and use Dyspnea scale;Knowledge and understanding of Target Heart Rate Range (THRR);Understanding of Exercise Prescription      Comments Pt is starting rehab today. Too early to making in comments on exercise. Will monitor and progress as able. [name] has completed 5 exercise sessions and has been consistent with workload and MET increases. He is able to do everything independently and has a high exercise capacity. He is exercising at 2.9 METS on the Nustep and 2.97 METS walking the track.       Expected Outcomes Through exercise at rehab and home the patient will decrease shortness of breath with daily activities and feel confident in carrying out an exercise regimn at home. Through exercise at rehab and home the patient will decrease shortness of breath with daily activities and feel confident in carrying out an exercise regimn at home.             Nutrition & Weight - Outcomes:  Pre Biometrics - 05/01/20 1123      Pre Biometrics   Grip Strength 28 kg           Post Biometrics - 06/27/20 1201       Post  Biometrics   Grip Strength 31 kg           Nutrition:  Nutrition Therapy & Goals - 06/05/20 0740      Nutrition Therapy  Diet High calorie/Renal (CKD3)    Protein (specify units) 57 g      Personal Nutrition Goals   Nutrition Goal Pt to identify food quantities necessary to achieve weight gain of 0.5 lbs week at while in cardiac rehab program    Personal Goal #2 Pt to meet protein and calorie goals    Personal Goal #3 Pt to choose lower sodium, phosphorus, and potassium foods      Intervention Plan   Intervention Prescribe, educate and counsel regarding individualized specific dietary modifications aiming towards targeted core components such as weight, hypertension, lipid management, diabetes, heart failure and other comorbidities.;Nutrition handout(s) given to patient.    Expected Outcomes Long Term Goal: Adherence to prescribed nutrition plan.;Short Term Goal: A plan has been developed with personal nutrition goals set during dietitian appointment.           Nutrition Discharge:  Nutrition Assessments - 06/05/20 0742      Rate Your Plate Scores   Pre Score 58           Education Questionnaire Score:  Knowledge Questionnaire Score - 07/06/20 1235      Knowledge Questionnaire Score   Post Score 17/18           Goals reviewed with patient; copy given to patient.

## 2020-07-20 NOTE — Addendum Note (Signed)
Encounter addended by: George Ina, RD on: 07/20/2020 11:06 AM  Actions taken: Flowsheet accepted

## 2020-07-26 NOTE — Progress Notes (Signed)
PCP: Lajean Manes, MD Cardiology: Dr. Irish Lack HF Cardiology: Dr. Aundra Dubin  80 y.o. with history of complete heart block, CKD stage 3, paroxysmal atrial fibrillation, CAD, and chronic systolic CHF was referred by Dr. Irish Lack for evaluation of possible cardiac amyloidosis.  In 1999, he had LAD atherectomy.  Patient developed complete heart block in 2012 with PPM placement.  He had a stress PET done at that time that was not suggestive of ischemia.   Paroxysmal atrial fibrillation was noted during device interrogation and apixaban was started.  Echo in 12/19 was noted to show EF 45-50% with severe LVH.    Echo was done again in 12/20, showing EF 35-40%, severe LVH with concern for amyloidosis, moderate RV dysfunction, low gradient moderate AS, IVC dilated.  Given concern for cardiac amyloidosis, urine immunofixation was done and was negative, and serum myeloma panel was done and was negative.  PYP scan was done and was suggestive of cardiac amyloidosis.  TTR amyloidosis genetic testing was negative, suggesting wild type disease.   Cardiolite in 2/21 showed EF 40%, no ischemia or infarction.  Echo in 8/21 showed EF 35-40%, moderate LVH, normal RV, mild AS.   Today he returns for HF follow up. Last visit (1/22) he was doing Pulmonary Rehab, with stable NYHA II symptoms and euvolemic. Now, overall feeling fine. Denies increasing SOB, CP, dizziness, edema, or PND/Orthopnea. Appetite ok. No fever or chills. Weight at home 155 pounds. Taking all medications. He and his wife are going to be moving to [place], [place] in 2 weeks. Walking 3-4x/week, bike 25-30 minutes. Struggling some with fatigue during the day.  St Jude device interrogation: Stable thoracic impedance, no VT, AT/AF on 07/05/20 lasing ~1 hr 20 mins.   Labs (11/20): K 4.7, creatinine 1.12 Labs (12/20): Urine immunofixation and serum myeloma panel negative.  Labs (1/21): K 3.6, creatinine 1.65, mildly increased Kappa/Lamba free light chain  ratio Labs (2/21): K 4.1, creatinine 1.89, LDL 63, HDL 52 Labs (3/21): K 4, creatinine 1.81 Labs (4/21): K 4.7, creatinine 1.53 Labs (8/21): K 3.9, creatinine 1.58, TSH mildly elevated but free T3 and T4 normal Labs (10/21): K 4, creatinine 1.61 Labs (1/22): K 4.6, creatinine 1.41, Hgb 13.7  PMH: 1. Complete heart block: PPM placed 11/12.  2. CKD: Stage 3.  3. Type 2 diabetes  4. OSA 5. H/o spinal stenosis 6. HTN 7. Atrial fibrillation: Paroxysmal.  8. Gibert's disease 9. Aortic stenosis: Echo in 12/20 with low gradient moderate AS. .  10. CAD: LAD atherectomy in 1999.  - Cardiolite (2/21): EF 40%, no ischemia or infarction.  11. Chronic systolic CHF: St Jude CRT-P device upgrade.  - Echo (12/20): EF 35-40%, severe LVH, looks like amyloidosis, moderately decreased RV systolic function, mild MR, low gradient moderate AS (10 mmHg, 1.1 cm^2), IVC dilated.  - TTR amyloidosis: PYP scan 12/20 with intense uptake in myocardium, H/CL ratio 1.67 with grade 3 uptake visually. Urine immunofixation and serum myeloma panel negative.  Gene testing for TTR was negative suggesting wild type disease.  - Echo (8/21): EF 35-40%, moderate LVH, normal RV, severe RAE, moderate LAE, IVC normal, mild AS.   Social History   Socioeconomic History  . Marital status: Married    Spouse name: Not on file  . Number of children: Not on file  . Years of education: Not on file  . Highest education level: Not on file  Occupational History  . Not on file  Tobacco Use  . Smoking status: Former Research scientist (life sciences)  .  Smokeless tobacco: Never Used  Vaping Use  . Vaping Use: Never used  Substance and Sexual Activity  . Alcohol use: Yes    Comment: occasional glass of wine  . Drug use: No  . Sexual activity: Not on file  Other Topics Concern  . Not on file  Social History Narrative  . Not on file   Social Determinants of Health   Financial Resource Strain: Not on file  Food Insecurity: Not on file  Transportation  Needs: Not on file  Physical Activity: Not on file  Stress: Not on file  Social Connections: Not on file  Intimate Partner Violence: Not on file   Family History  Problem Relation Age of Onset  . Cancer Mother        Small cell lung cancer  . Diabetes Father        DM  . CAD Father   . Hypertension Father   . Heart disease Father   . Heart attack Father   . Hypertension Sister    ROS: All systems reviewed and negative except as per HPI.  Wt Readings from Last 3 Encounters:  07/27/20 73.2 kg (161 lb 6.4 oz)  06/20/20 74.2 kg (163 lb 9.3 oz)  06/15/20 73.3 kg (161 lb 9.6 oz)   BP 103/63   Pulse 70   Wt 73.2 kg (161 lb 6.4 oz)   SpO2 98%   BMI 22.51 kg/m  General:  NAD. No resp difficulty. Elderly HEENT: Normal Neck: Supple. No JVD. Carotids 2+ bilat; no bruits. No lymphadenopathy or thryomegaly appreciated. Cor: PMI nondisplaced. Regular rate & rhythm. No rubs, gallops, I/VI SEM RUSB Lungs: Clear Abdomen: Soft, nontender, nondistended. No hepatosplenomegaly. No bruits or masses. Good bowel sounds. Extremities: No cyanosis, clubbing, rash, edema Neuro: alert & oriented x 3, cranial nerves grossly intact. Moves all 4 extremities w/o difficulty. Affect pleasant.  Assessment/Plan: 1. Chronic systolic CHF: Echo from 10/25 was reviewed, showed EF around 35-40% with severe LVH with appearance concerning for cardiac amyloidosis, moderate RV dysfunction, low gradient moderate AS.  He has renal dysfunction and symptoms consistent with peripheral neuropathy.  He has also had cardiac conduction abnormalities.  Presentation is concerning for cardiac amyloidosis, with slow progressive more suggestive of ATTR amyloidosis.  Myeloma panel and urine immunofixation negative so AL amyloidosis unlikely. The Kappa/Lambda light chain ratio was mildly elevated, but reviewed this with Dr Irene Limbo and suspect slightly abnormal serum K/L free light ratios likely from his renal insufficiency with differential  light chain excretion.  PYP scan was reviewed and markedly positive, suggesting ATTR amyloidosis. Negative ATTR gene testing suggests wild type disease ATTR amyloidosis. He has history of CAD but no recent chest pain, Cardiolite in 2/21 was negative.  Echo in 8/21 with EF 35-40%, moderate LVH, normal RV.  He has a Physiological scientist.  NYHA class II symptoms now, volume status currently ok on exam and by Corvue. - With soft BP, will keep Toprol XL at 12.5 mg daily and spironolactone at 12.5 mg daily and continue to take these at bedtime.  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  - Continue Farxiga 10 mg daily.  - Continue torsemide 30 mg daily.  CMET drawn at PCP today, will request labs.   - No BP room for Entresto.    - Encouraged to continue regular exercise.  2. CAD: H/o LAD atherectomy in 1999.  No chest pain. I think progressive CAD is unlikely as cause of  worsening LV function, Cardiolite in 2/21 was normal.  - He is on Eliquis, no ASA.  - Continue statin.  3. Atrial fibrillation: Paroxysmal. Regular on exam today. 1 AF episode on 07/05/20 lasting ~ 1 hr 20 mins - Continue apixaban, using 2.5 mg bid dosing with creatinine > 1.5 and age > 56. CBC stable (1/22). 4. CKD: Stage 3.  CMET from PCP pending. 5. Aortic stenosis: Mild on 8/21 echo.  6. Peripheral neuropathy: Suspect amyloid-related, prominent balance difficulty.  He has diet controlled diabetes so unlikely diabetic neuropathy. No recent falls. 7. Complete heart block: St Jude CRT-P system.   Moving to [place] in 2 weeks. Will establish with HF MD in [place].  Encouraged to call office with questions/concerns while establishing care in [place] FNP-BC 07/27/2020

## 2020-07-27 ENCOUNTER — Ambulatory Visit (HOSPITAL_COMMUNITY)
Admission: RE | Admit: 2020-07-27 | Discharge: 2020-07-27 | Disposition: A | Discharge status: Home / Self Care | Payer: Medicare HMO | Source: Ambulatory Visit | Attending: Family Medicine | Admitting: Family Medicine

## 2020-07-27 ENCOUNTER — Other Ambulatory Visit: Payer: Self-pay

## 2020-07-27 ENCOUNTER — Encounter (HOSPITAL_COMMUNITY): Payer: Self-pay

## 2020-07-27 VITALS — BP 103/63 | HR 70 | Wt 161.4 lb

## 2020-07-27 DIAGNOSIS — E1142 Type 2 diabetes mellitus with diabetic polyneuropathy: Secondary | ICD-10-CM | POA: Diagnosis not present

## 2020-07-27 DIAGNOSIS — I5022 Chronic systolic (congestive) heart failure: Secondary | ICD-10-CM | POA: Diagnosis not present

## 2020-07-27 DIAGNOSIS — G4733 Obstructive sleep apnea (adult) (pediatric): Secondary | ICD-10-CM | POA: Insufficient documentation

## 2020-07-27 DIAGNOSIS — Z87891 Personal history of nicotine dependence: Secondary | ICD-10-CM | POA: Insufficient documentation

## 2020-07-27 DIAGNOSIS — I43 Cardiomyopathy in diseases classified elsewhere: Secondary | ICD-10-CM

## 2020-07-27 DIAGNOSIS — I2510 Atherosclerotic heart disease of native coronary artery without angina pectoris: Secondary | ICD-10-CM

## 2020-07-27 DIAGNOSIS — N183 Chronic kidney disease, stage 3 unspecified: Secondary | ICD-10-CM | POA: Diagnosis not present

## 2020-07-27 DIAGNOSIS — I350 Nonrheumatic aortic (valve) stenosis: Secondary | ICD-10-CM

## 2020-07-27 DIAGNOSIS — E854 Organ-limited amyloidosis: Secondary | ICD-10-CM

## 2020-07-27 DIAGNOSIS — I130 Hypertensive heart and chronic kidney disease with heart failure and stage 1 through stage 4 chronic kidney disease, or unspecified chronic kidney disease: Secondary | ICD-10-CM | POA: Insufficient documentation

## 2020-07-27 DIAGNOSIS — Z79899 Other long term (current) drug therapy: Secondary | ICD-10-CM | POA: Insufficient documentation

## 2020-07-27 DIAGNOSIS — I442 Atrioventricular block, complete: Secondary | ICD-10-CM

## 2020-07-27 DIAGNOSIS — E1122 Type 2 diabetes mellitus with diabetic chronic kidney disease: Secondary | ICD-10-CM | POA: Diagnosis not present

## 2020-07-27 DIAGNOSIS — Z7901 Long term (current) use of anticoagulants: Secondary | ICD-10-CM | POA: Insufficient documentation

## 2020-07-27 DIAGNOSIS — N189 Chronic kidney disease, unspecified: Secondary | ICD-10-CM

## 2020-07-27 DIAGNOSIS — I480 Paroxysmal atrial fibrillation: Secondary | ICD-10-CM | POA: Insufficient documentation

## 2020-07-27 NOTE — Patient Instructions (Signed)
It was great to see you today! No medication changes are needed at this time.  Please feel free to call us if you need a follow up  If you have any questions or concerns before your next appointment please send [place] a message through [place] or call our office at [phone].    TO LEAVE A MESSAGE FOR THE NURSE SELECT OPTION 2, PLEASE LEAVE A MESSAGE INCLUDING: . YOUR NAME . DATE OF BIRTH . CALL BACK NUMBER . REASON FOR CALL**this is important as we prioritize the call backs  YOU WILL RECEIVE A CALL BACK THE SAME DAY AS LONG AS YOU CALL BEFORE 4:00 PM

## 2020-07-28 ENCOUNTER — Other Ambulatory Visit (HOSPITAL_COMMUNITY): Payer: Self-pay | Admitting: *Deleted

## 2020-07-28 MED ORDER — SPIRONOLACTONE 25 MG PO TABS: 12.5000 mg | ORAL_TABLET | Freq: Every day | ORAL | 3 refills | Status: DC

## 2020-07-28 MED ORDER — SPIRONOLACTONE 25 MG PO TABS: 12.5000 mg | ORAL_TABLET | Freq: Every day | ORAL | 3 refills | Status: AC

## 2020-07-28 NOTE — Addendum Note (Signed)
Addended by: Scarlette Calico on: 07/28/2020 05:03 PM   Modules accepted: Orders

## 2020-07-31 ENCOUNTER — Telehealth (INDEPENDENT_AMBULATORY_CARE_PROVIDER_SITE_OTHER): Payer: Medicare HMO | Admitting: Neurology

## 2020-07-31 ENCOUNTER — Other Ambulatory Visit: Payer: Self-pay

## 2020-07-31 ENCOUNTER — Encounter: Payer: Self-pay | Admitting: Neurology

## 2020-07-31 ENCOUNTER — Ambulatory Visit (INDEPENDENT_AMBULATORY_CARE_PROVIDER_SITE_OTHER): Payer: Medicare HMO

## 2020-07-31 DIAGNOSIS — E854 Organ-limited amyloidosis: Secondary | ICD-10-CM | POA: Diagnosis not present

## 2020-07-31 DIAGNOSIS — I7121 Aneurysm of the ascending aorta, without rupture: Secondary | ICD-10-CM

## 2020-07-31 DIAGNOSIS — G4731 Primary central sleep apnea: Secondary | ICD-10-CM | POA: Diagnosis not present

## 2020-07-31 DIAGNOSIS — N1831 Chronic kidney disease, stage 3a: Secondary | ICD-10-CM

## 2020-07-31 DIAGNOSIS — I712 Thoracic aortic aneurysm, without rupture: Secondary | ICD-10-CM

## 2020-07-31 DIAGNOSIS — I442 Atrioventricular block, complete: Secondary | ICD-10-CM

## 2020-07-31 DIAGNOSIS — Z7189 Other specified counseling: Secondary | ICD-10-CM

## 2020-07-31 DIAGNOSIS — I482 Chronic atrial fibrillation, unspecified: Secondary | ICD-10-CM

## 2020-07-31 DIAGNOSIS — I43 Cardiomyopathy in diseases classified elsewhere: Secondary | ICD-10-CM

## 2020-07-31 NOTE — Progress Notes (Signed)
Virtual Visit via Telephone Note  I connected with David [name] on 07/31/20 at 10:30 AM EST by telephone and verified that I am speaking with the correct person using two identifiers.  Location: Patient: at his home - on the phone with his wife- both are  in the process to move to [facility].  Provider: at her office , [facility]    I discussed the limitations, risks, security and privacy concerns of performing an evaluation and management service by telephone and the availability of in person appointments. I also discussed with the patient that there may be a patient responsible charge related to this service. The patient expressed understanding and agreed to proceed.   History of Present Illness:   David [name] also known as ' David [name]', is a meanwhile 80 year old Caucasian gentleman who has been treated with adaptive servo ventilation for an underlying complex sleep apnea with a central dominant sleep apnea.   He has been using his machine compliantly also he rarely sleeps longer than 4-1/2 hours.  His residual AHI has been 6.0 his air leaks at the 95th percentile have been 20.4 L and his average use at time is 4 hours and 28 minutes.  This is over the last 90 days.  EPAP is set to 7 cmH2O minimum pressure support is 4 and maximum pressure support is 16 cmH2O.    ASV user- he received a new mask 30 days ago and this addressed and reduced the previously air leak- reduced it.  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 male , born in [place] and seen here as in a referral/ revisit  from Dr. [name] for a new sleep evaluation . Wife has reported irregular breathing at night, gasping and mild snoring. He underwent a 2007 sleep study at [facility], with an AHI of 1.0/hr. Bradycardia. No CPAP was necessary.  He has been treated for RLS since 1999, on sinemet , Klonopin, Requip and now Mirapex ,now at 2 mg . He later developed heart block and in 2012 a cardiac  pacemaker was implanted.He now gets more restless during the night. He has no insomnia, falls asleep easily but has 1-2 bathroom breaks. He talks in his sleep. He has kicked a ball during one of his dreams and fell out of bed. He has neuropathy, too.     Sleep habits are as follows: his bedtime is usually between 11 pm and 1 AM, he likes to work on his computer late.  He falls asleep promptly, in his cool, dark and quiet environment. He has to urinate once at 4 AM, and goes back to sleep. He sleeps until 5 AM, and stays in bed a little longer , rising at 6.30 AM. He feels refreshed , he can concentrate, focus.  He does have vivid dreams sometimes, but does not always remember them. He often sleeps supine, and in that position produces most apnea and irregular breathing. His wife has confirmed that the supine sleep position seems to predispose him to apnea. He sleeps on 2 pillows. He is restless- he likes a hard, firm surface. He likes a weighted blanket Result of ASV titration. Central Sleep Apnea, emerging under CPAP and BiPAP and finally controlled under ASV after other modalities failed.   PLANS/RECOMMENDATIONS:  We will place this patient on ASV- 15 maximum pressure support, 7 minimum pressure support and 4 cm EEP. Heated humidity and a Respironics Dream wear FFM in size small were added to the order.  [name], M.D.    05 09-2017   12-31-2017, RV with [name] present. Had abnormal creatinine levels, has complete heart block, pacemaker since 2012 ( after bradycardia), later had a CVA, recently  had severe water retention. Gained 20 pounds water weight while in [place] this summer when he didn't take his diuretics.  He uses ASV with success and noticed more alertness. This after failing first CPAP and then BiPAP.  He is chronically anticoagulated, has a pacemaker, and used to have more restless leg syndrome with neuropathy.  Controlled however on the Mirapex.  The patient did the best on an ASV  machine with 15 cm maximum pressure support, 7 cm with a minimum pressure support and 4 cm expiratory range.  Heated humidity and a Respironics DreamWear full facemask was suggested. He continues to follow with Dr. Lovena Le for electro- physiology. [name] also reminded me that he has been taking Mirapex for about 17 years ever since it came to market for the treatment of restless legs.  His restless legs follow the development of neuropathy and he was interested to see if he still needs Mirapex, if the dose could be reduced or if other medications could supplement its effect.  We discussed Neurontin and for this reason the generic form of gabapentin I looked at his metabolic panels which revealed for February of this year a decrease in creatinine clearance indicative of CKD stage II.   UPDATE: 05-31-2019: I have the pleasure of seeing David [name] today he is a 12 soon 80 year old patient with a history of central apnea related to a cardiac amyloidosis disorder that has reduced his ejection fraction to 35%.  His use of the air curve ASV machine has been very sporadic over the last 8 months the last data I have are from early August, the machine is set at 7 cm expiratory pressure assistance with a minimum pressure support of 4 maximum pressure support of 16.  The device worked only well but David [name] avoid sleeping in supine position which is hard to do with a full facemask.  So his full facemask also leaked used a ResMed F 20 and medium size.  For this reason I have changed him today to a be [name] and pico mask and medium that feels much better around his nasal bridge and under eye region.  He will give this new set up a trial for the next 30 days and when he has reestablished compliance data I will order the supplies for him in the future at [facility].  He also announced that in about 3 months he and his wife will move to [place], [facility] I am going  to look for a local sleep doctor that can follow-up with him, his fatigue severity score was 28 his Epworth sleepiness score was 5, he is not endorsing depression on the geriatric depression scale.  His apnea events were 1.8/h of use he did have several more hypopneas 8.0, he did have a 95th percentile air leak of 12.8 L/min again this seems to be related to supine sleep position.     UPDATE 2/10/2020CM DavidMacon, 80 year old male returns for follow-up for ASV compliance.  He has not had any difficulty.  He is due for cardioversion February 26.  Data dated 06/03/2018-07/02/2018 shows compliance greater than 4 hours at 77% for 23 days.  Less than 4 hours 6 days 20% for total compliance of 97%.  Average use 6 hours  23 minutes.  Pressure 7 to 15 cm.  Leak 95th percentile 9.6.  AHI 18.4.  He returns for reevaluation REVIEW OF SYSTEMS: Full 14 system review of systems performed and notable only for those listed, all others are neg:  Constitutional: neg  Cardiovascular: neg Ear/Nose/Throat: neg  Skin: neg Eyes: neg Respiratory: neg Gastroitestinal: neg  Hematology/Lymphatic: Easy bruising Endocrine: Intolerance to cold Musculoskeletal: Walking difficulty Allergy/Immunology: neg Neurological: neg Psychiatric: neg Sleep : ASV        Observations/Objective:  See data as above.    Assessment and Plan: continue ASV use.  Mr. Leichter commented that he and his wife are moving out of state to [facility] in the near future.  He would like to continue his sleep care from here and I will offer him to follow-up virtually via video not by phone, as we did today. He will also send me a MyChart message so that I would be connected to his new primary care provider.  There is a pulmonology medical group with sleep specialists in his [place], should it change become necessary.  For now I do not foresee that we have any problem following his ASV use remotely.  I encouraged the patient to use the ASV  even when he naps and to try to add at least 30 minutes to his nightly sleep routine.  Mr. Siebenaler and his wife were both communicating over the phone with me today and both acknowledged that he will try to do so, and that they will set up a video camera.   Follow Up Instructions: ASV related video visit in 12 month.      I discussed the assessment and treatment plan with the patient. The patient was provided an opportunity to ask questions and all were answered. The patient agreed with the plan and demonstrated an understanding of the instructions.   The patient was advised to call back or seek an in-person evaluation if the symptoms worsen or if the condition fails to improve as anticipated.  I provided 12 minutes of non-face-to-face time during this encounter.   [name], MD

## 2020-08-01 ENCOUNTER — Ambulatory Visit (INDEPENDENT_AMBULATORY_CARE_PROVIDER_SITE_OTHER): Payer: Medicare HMO

## 2020-08-01 DIAGNOSIS — I5022 Chronic systolic (congestive) heart failure: Secondary | ICD-10-CM | POA: Diagnosis not present

## 2020-08-01 DIAGNOSIS — Z950 Presence of cardiac pacemaker: Secondary | ICD-10-CM | POA: Diagnosis not present

## 2020-08-02 LAB — CUP PACEART REMOTE DEVICE CHECK
Battery Remaining Longevity: 58 mo
Battery Remaining Percentage: 95.5 %
Battery Voltage: 2.98 V
Brady Statistic AP VP Percent: 91 %
Brady Statistic AP VS Percent: 1 %
Brady Statistic AS VP Percent: 8.9 %
Brady Statistic AS VS Percent: 1 %
Brady Statistic RA Percent Paced: 91 %
Date Time Interrogation Session: 20220307020020
Implantable Lead Implant Date: 20121031
Implantable Lead Implant Date: 20121031
Implantable Lead Implant Date: 20201209
Implantable Lead Implant Date: 20201209
Implantable Lead Location: 753858
Implantable Lead Location: 753859
Implantable Lead Location: 753860
Implantable Lead Location: 753862
Implantable Lead Model: 3830
Implantable Pulse Generator Implant Date: 20201209
Lead Channel Impedance Value: 290 Ohm
Lead Channel Impedance Value: 350 Ohm
Lead Channel Impedance Value: 440 Ohm
Lead Channel Pacing Threshold Amplitude: 0.75 V
Lead Channel Pacing Threshold Amplitude: 0.75 V
Lead Channel Pacing Threshold Amplitude: 0.75 V
Lead Channel Pacing Threshold Pulse Width: 0.5 ms
Lead Channel Pacing Threshold Pulse Width: 0.8 ms
Lead Channel Pacing Threshold Pulse Width: 1 ms
Lead Channel Sensing Intrinsic Amplitude: 2.1 mV
Lead Channel Setting Pacing Amplitude: 2 V
Lead Channel Setting Pacing Amplitude: 2 V
Lead Channel Setting Pacing Amplitude: 2.5 V
Lead Channel Setting Pacing Pulse Width: 0.8 ms
Lead Channel Setting Pacing Pulse Width: 1 ms
Lead Channel Setting Sensing Sensitivity: 4 mV
Pulse Gen Model: 3222
Pulse Gen Serial Number: 9138824

## 2020-08-04 NOTE — Progress Notes (Signed)
EPIC Encounter for ICM Monitoring  Patient Name: David [name] is a 80 y.o. male Date: 08/04/2020 Primary Care Physican: Lajean Manes, MD Primary Cardiologist:[facility] Electrophysiologist:Taylor Bi-V Pacing:>99% 12/15/2021Weight:155lbs  AT/AF Burden:<1% (taking Eliquis)   Transmission reviewed.   CorvueThoracic impedancenormal but was suggesting possible fluid accumulation from3/05/2020 - 08/03/2020.  Prescribed:   Torsemide20mg Take 1.5 tablets (430 mg total) by mouth daily.   Potassium 20 mEq take1tablet (68mEq total)by mouth daily  Spironolactone 25 mg take0.5tablet (12.5 mg total)at bedtime  Labs: 06/15/2020 Creatinine 1.41, BUN 37, Potassium 4.6, Sodium 137, GFR 50 03/21/2020 Creatinine 1.61, BUN 49, Potassium 4.0, Sodium 136, GFR 40 01/05/2020 Creatinine 1.58, BUN 43, Potassium 3.9, Sodium 137, GFR 40-47 12/14/2019 Creatinine1.58, BUN42, Potassium4.4, Sodium136, GFR40-47 09/14/2019 Creatinine1.53, BUN40, Potassium4.7, [id number], [id number]  07/28/2019 Creatinine1.81, BUN53, Potassium4.0, Sodium135, GFR34-40  07/19/2019 Creatinine1.89, BUN44, Potassium4.1, [id number], [id number] A complete set of results can be found in Results Review.  Recommendations: No Changes.  Follow-up plan: ICM clinic phone appointment on4/03/2021. 91 day device clinic remote transmission6/10/2020.  EP/Cardiology Office Visits: 08/07/2020 with Dr Lovena Le.   Copy of ICM check sent to Dr.Taylor.   3 month ICM trend: 08/01/2020.    1 Year ICM trend:       [name], RN 08/04/2020 2:35 PM

## 2020-08-06 ENCOUNTER — Encounter: Payer: Self-pay | Admitting: Neurology

## 2020-08-07 ENCOUNTER — Encounter: Payer: Self-pay | Admitting: Internal Medicine

## 2020-08-07 ENCOUNTER — Other Ambulatory Visit: Payer: Self-pay

## 2020-08-07 ENCOUNTER — Ambulatory Visit: Payer: Medicare HMO | Admitting: Internal Medicine

## 2020-08-07 VITALS — BP 104/60 | HR 70 | Ht 71.0 in | Wt 157.0 lb

## 2020-08-07 DIAGNOSIS — Z950 Presence of cardiac pacemaker: Secondary | ICD-10-CM | POA: Diagnosis not present

## 2020-08-07 DIAGNOSIS — I442 Atrioventricular block, complete: Secondary | ICD-10-CM | POA: Diagnosis not present

## 2020-08-07 DIAGNOSIS — I5032 Chronic diastolic (congestive) heart failure: Secondary | ICD-10-CM | POA: Diagnosis not present

## 2020-08-07 DIAGNOSIS — I480 Paroxysmal atrial fibrillation: Secondary | ICD-10-CM | POA: Diagnosis not present

## 2020-08-07 NOTE — Patient Instructions (Signed)
Medication Instructions:  Your physician recommends that you continue on your current medications as directed. Please refer to the Current Medication list given to you today.  Labwork: None ordered.  Testing/Procedures: None ordered.  Follow-Up: Your physician wants you to follow-up in: as needed.   (Patient is moving to [place])    Any Other Special Instructions Will Be Listed Below (If Applicable).  If you need a refill on your cardiac medications before your next appointment, please call your pharmacy.

## 2020-08-07 NOTE — Progress Notes (Signed)
HPI Mr. Diliberto returns today for followup. He is a pleasant 80 yo man with a h/o CAD, CHB, s/p PPM, worsening LV dysfunction with biv upgrade, PAF, and recent diagnosis of cardiac amyloidosis. He has class 2 CHF symptoms. He has not had chest pain. He denies syncope. Minimal edema. He is moving to [place], [place].  Allergies  Allergen Reactions  . Benadryl [Diphenhydramine Hcl] Other (See Comments)    Adverse reaction, has very strange behavior. Also hyperactive.  . Lisinopril Other (See Comments)    hypotensive  . Restoril [Temazepam] Other (See Comments)    Hyperactive     Current Outpatient Medications  Medication Sig Dispense Refill  . acetaminophen (TYLENOL) 500 MG tablet Take 1,000 mg by mouth every 6 (six) hours as needed for mild pain.     [name] apixaban (ELIQUIS) 2.5 MG TABS tablet Take 1 tablet (2.5 mg total) by mouth 2 (two) times daily. 30 tablet 0  . ascorbic acid (VITAMIN C) 500 MG tablet Take 500 mg by mouth daily. Pt states taking 3/week    . Cholecalciferol (VITAMIN D) 50 MCG (2000 UT) tablet Take 2,000 Units by mouth daily.    . Coenzyme Q10 (COQ10) 100 MG CAPS Take 100 mg by mouth daily.     . Cyanocobalamin (VITAMIN B-12) 2500 MCG SUBL Place 2,500 mcg under the tongue daily.     [name] FARXIGA 10 MG TABS tablet TAKE 1 TABLET(10 MG) BY MOUTH DAILY BEFORE BREAKFAST 30 tablet 6  . ferrous sulfate 325 (65 FE) MG tablet Take 325 mg by mouth 3 (three) times a week.    . Fluorouracil (TOLAK) 4 % CREA Apply 1 application topically at bedtime. 40 g 1  . fluticasone (FLONASE) 50 MCG/ACT nasal spray Place 1 spray into both nostrils daily as needed for allergies or rhinitis.    [name] loratadine (CLARITIN) 10 MG tablet 1 tablet    . magnesium oxide (MAG-OX) 400 MG tablet Take 400 mg by mouth daily.    . metoprolol succinate (TOPROL XL) 25 MG 24 hr tablet Take 0.5 tablets (12.5 mg total) by mouth at bedtime. 45 tablet 3  . Multiple Vitamins-Minerals (PRESERVISION AREDS 2) CAPS Take 1  capsule by mouth 2 (two) times daily.    . nitroGLYCERIN (NITROSTAT) 0.4 MG SL tablet Place 1 tablet (0.4 mg total) under the tongue every 5 (five) minutes as needed. For chest pain 25 tablet 5  . Omega-3 Fatty Acids (FISH OIL) 1000 MG CAPS 1 capsule with a meal    . Polyvinyl Alcohol-Povidone (REFRESH OP) Place 1 drop into both eyes daily as needed (dry eyes).    . potassium chloride SA (KLOR-CON) 20 MEQ tablet Take 20 mEq by mouth daily.    . pramipexole (MIRAPEX) 1 MG tablet Take 2 mg by mouth at bedtime.    . simvastatin (ZOCOR) 10 MG tablet Take 10 mg by mouth every evening.    [name] spironolactone (ALDACTONE) 25 MG tablet Take 0.5 tablets (12.5 mg total) by mouth at bedtime. 45 tablet 3  . Tafamidis (VYNDAMAX) 61 MG CAPS Take 61 mg by mouth daily. 90 capsule 3  . torsemide (DEMADEX) 20 MG tablet Take 1.5 tablets (30 mg total) by mouth daily. 60 tablet 3  . triamcinolone cream (KENALOG) 0.1 % Apply 1 application topically 2 (two) times daily as needed (itching).     No current facility-administered medications for this visit.     Past Medical History:  Diagnosis Date  . Aortic  stenosis    a.mild-mod by echo 06/2017.  . Ascending aorta dilatation (HCC)    a. mildly dilated aortic root and mod dilated ascending aorta by echo 06/2017.  [name] Chronic diastolic CHF (congestive heart failure) ([place])   . CKD (chronic kidney disease), stage III ([place])   . Complete heart block (HCC)    a. s/p STJ pacemaker in 02/2011 - Dr. [name]  . Coronary artery disease    a. LAD atherectomy 1999.  . Gilbert's syndrome   . Hypertension   . Insomnia, unspecified   . Pacemaker-[place] [facility] 07/16/2011  . PAF (paroxysmal atrial fibrillation) ([place])    a. identified on pacemaker interrogation; on Eliquis.  . Peripheral neuropathy   . Pure hypercholesterolemia   . Restless leg syndrome   . Spermatocele    near left testicle 2 cm  . Spinal stenosis, lumbar   . Thrombocytopenia ([place])    Averaging 140,000 since 2008  .  Tubular adenoma 09/2012   Dr. Watt Climes repeat 4 years  . Type II or unspecified type diabetes mellitus without mention of complication, not stated as uncontrolled   . Unspecified sleep apnea     ROS:   All systems reviewed and negative except as noted in the HPI.   Past Surgical History:  Procedure Laterality Date  . APPENDECTOMY  1957  . CHOLECYSTECTOMY  1988  . CORONARY STENT PLACEMENT    . HEMORROIDECTOMY  1980  . HERNIA REPAIR Bilateral   . LEAD INSERTION N/A 05/05/2019   Procedure: LEAD INSERTION;  Surgeon: Evans Lance, MD;  Location: [facility];  Service: Cardiovascular;  Laterality: N/A;  . pacermaker  07/16/11   Dr. [name]  . PPM GENERATOR CHANGEOUT N/A 05/05/2019   Procedure: PPM GENERATOR CHANGEOUT;  Surgeon: Evans Lance, MD;  Location: [facility];  Service: Cardiovascular;  Laterality: N/A;  . TEE WITHOUT CARDIOVERSION N/A 06/14/2014   Procedure: TRANSESOPHAGEAL ECHOCARDIOGRAM (TEE);  Surgeon: [name], MD;  Location: [facility] ENDOSCOPY;  Service: Cardiovascular;  Laterality: N/A;     Family History  Problem Relation Age of Onset  . Cancer Mother        Small cell lung cancer  . Diabetes Father        DM  . CAD Father   . Hypertension Father   . Heart disease Father   . Heart attack Father   . Hypertension Sister      Social History   Socioeconomic History  . Marital status: Married    Spouse name: Not on file  . Number of children: Not on file  . Years of education: Not on file  . Highest education level: Not on file  Occupational History  . Not on file  Tobacco Use  . Smoking status: Former Research scientist (life sciences)  . Smokeless tobacco: Never Used  Vaping Use  . Vaping Use: Never used  Substance and Sexual Activity  . Alcohol use: Yes    Comment: occasional glass of wine  . Drug use: No  . Sexual activity: Not on file  Other Topics Concern  . Not on file  Social History Narrative  . Not on file   Social Determinants of Health   Financial  Resource Strain: Not on file  Food Insecurity: Not on file  Transportation Needs: Not on file  Physical Activity: Not on file  Stress: Not on file  Social Connections: Not on file  Intimate Partner Violence: Not on file     BP 104/60  Pulse 70   Ht 5\' 11"  (1.803 m)   Wt 157 lb (71.2 kg)   SpO2 96%   BMI 21.90 kg/m   Physical Exam:  Well appearing NAD HEENT: Unremarkable Neck:  No JVD, no thyromegally Lymphatics:  No adenopathy Back:  No CVA tenderness Lungs:  Clear with no wheezes HEART:  Regular rate rhythm, no murmurs, no rubs, no clicks Abd:  soft, positive bowel sounds, no organomegally, no rebound, no guarding Ext:  2 plus pulses, no edema, no cyanosis, no clubbing Skin:  No rashes no nodules Neuro:  CN II through XII intact, motor grossly intact  DEVICE  Normal device function.  See PaceArt for details.   Assess/Plan: 1. Chronic systolic heart failure - his symptoms are class 2 on maximal medial therapy.  2. CHB - he is asymptomatic, s/p Biv PPM insertion.  3. PAF - he is maintaining NSR on no AA drugs. 4. CAD - he denies anginal symptoms. Continue current meds.  Carleene Overlie Taylor,MD

## 2020-08-08 NOTE — Progress Notes (Signed)
Remote pacemaker transmission.   

## 2020-08-24 ENCOUNTER — Ambulatory Visit: Payer: Medicare HMO | Admitting: Physician Assistant

## 2020-09-04 ENCOUNTER — Ambulatory Visit (INDEPENDENT_AMBULATORY_CARE_PROVIDER_SITE_OTHER): Payer: Medicare HMO

## 2020-09-04 DIAGNOSIS — Z950 Presence of cardiac pacemaker: Secondary | ICD-10-CM

## 2020-09-04 DIAGNOSIS — I5032 Chronic diastolic (congestive) heart failure: Secondary | ICD-10-CM | POA: Diagnosis not present

## 2020-09-05 ENCOUNTER — Telehealth: Payer: Self-pay

## 2020-09-05 NOTE — Telephone Encounter (Signed)
Remote ICM transmission received.  Attempted call to patient regarding ICM remote transmission and left detailed message per DPR.  Advised to return call for any fluid symptoms or questions. Next ICM remote transmission scheduled 10/18/2020.

## 2020-09-05 NOTE — Progress Notes (Signed)
EPIC Encounter for ICM Monitoring  Patient Name: David [name] is a 80 y.o. male Date: 09/05/2020 Primary Care Physican: Lajean Manes, MD Primary Cardiologist:McLean Electrophysiologist:Taylor Bi-V Pacing:>99% 12/15/2021Weight:155lbs  AT/AF Burden:<1% (taking Eliquis)   Attempted call to patient and unable to reach.  Left detailed message per DPR regarding transmission. Transmission reviewed. .  ER visit due to fall and striking head.  CorvueThoracic impedancenormal but was suggesting possible fluid accumulation from3/16/2022 - 08/20/2020.  Prescribed:   Torsemide20mg Take1.5tablets (30 mg total) by mouth daily.   Potassium 20 mEq take1tablet (44mEq total)by mouth daily  Spironolactone 25 mg take0.5tablet (12.5 mg total)at bedtime  Farxiga 10 mg take 1 tablet daily  Labs: 09/01/2020 Creatinine 1.4,   BUN 44, Potassium 4.2, Sodium 136, GFR 50 Care Everywhere 06/15/2020 Creatinine 1.41, BUN 37, Potassium 4.6, Sodium 137, GFR 50 A complete set of results can be found in Results Review.  Recommendations:Left voice mail with ICM number and encouraged to call if experiencing any fluid symptoms.  Follow-up plan: ICM clinic phone appointment on5/25/2022. 91 day device clinic remote transmission6/10/2020.  EP/Cardiology Office Visits: Last EP visit was3/14/2022 with Dr Lovena Le (no recall for 2023).   Copy of ICM check sent to Dr.Taylor.   3 month ICM trend: 09/04/2020.    1 Year ICM trend:       [name], RN 09/05/2020 8:17 AM

## 2020-10-17 ENCOUNTER — Ambulatory Visit (INDEPENDENT_AMBULATORY_CARE_PROVIDER_SITE_OTHER): Payer: Medicare HMO

## 2020-10-17 DIAGNOSIS — I5032 Chronic diastolic (congestive) heart failure: Secondary | ICD-10-CM

## 2020-10-17 DIAGNOSIS — Z950 Presence of cardiac pacemaker: Secondary | ICD-10-CM

## 2020-10-18 NOTE — Progress Notes (Signed)
EPIC Encounter for ICM Monitoring  Patient Name: David [name] is a 80 y.o. male Date: 10/18/2020 Primary Care Physican: Lajean Manes, MD Primary Cardiologist:McLean Electrophysiologist:Taylor Bi-V Pacing:>99% 09/01/2020 Weight:150lbs  AT/AF Burden:<1% (taking Eliquis)   Spoke with patient and reports feeling well at this time.  Heart failure questions reviewed. He denies any dehydration symptoms.  Advised to maintain a fluid level intake of 64 oz daily to stay hydrated.  Patient has moved to [place] and in process of locating new cardiologist.   CorvueThoracic impedancesuggesting possible dryness starting 10/12/2020.  Prescribed:   Torsemide20mg Take1.5tablets (30 mg total) by mouth daily.   Potassium 20 mEq take1tablet (96mEq total)by mouth daily  Spironolactone 25 mg take0.5tablet (12.5 mg total)at bedtime  Farxiga 10 mg take 1 tablet daily  Labs: 09/01/2020 Creatinine 1.4,   BUN 44, Potassium 4.2, Sodium 136, GFR 50 Care Everywhere 06/15/2020 Creatinine 1.41, BUN 37, Potassium 4.6, Sodium 137, GFR 50 A complete set of results can be found in Results Review.  Recommendations: Advised to maintain fluid intake of 64 oz daily.   Follow-up plan: ICM clinic phone appointment on6/06/2020 to recheck fluid levels 91 day device clinic remote transmission6/10/2020.  EP/Cardiology Office Visits: Last EP visit was3/14/2022 with Dr Lovena Le (no recall for 2023).   Copy of ICM check sent to Dr.Taylor.   3 month ICM trend: 10/16/2020.    1 Year ICM trend:       [name], RN 10/18/2020 10:22 AM

## 2020-10-26 ENCOUNTER — Ambulatory Visit (INDEPENDENT_AMBULATORY_CARE_PROVIDER_SITE_OTHER): Payer: Medicare HMO

## 2020-10-26 DIAGNOSIS — I5032 Chronic diastolic (congestive) heart failure: Secondary | ICD-10-CM

## 2020-10-26 DIAGNOSIS — Z950 Presence of cardiac pacemaker: Secondary | ICD-10-CM

## 2020-10-27 ENCOUNTER — Telehealth: Payer: Self-pay

## 2020-10-27 NOTE — Telephone Encounter (Signed)
Remote ICM transmission received.  Attempted call to patient regarding ICM remote transmission and left detailed message per DPR.  Advised to return call for any fluid symptoms or questions. Next ICM remote transmission scheduled 11/20/2020.

## 2020-10-27 NOTE — Progress Notes (Signed)
EPIC Encounter for ICM Monitoring  Patient Name: Verland Sprinkle is a 80 y.o. male Date: 10/27/2020 Primary Care Physican: Lajean Manes, MD Primary Cardiologist:McLean Electrophysiologist:Taylor Bi-V Pacing:>99% 09/01/2020 Weight:150lbs  AT/AF Burden:<1% (taking Eliquis)   Attempted call to patient and unable to reach.  Left detailed message per DPR regarding transmission. Transmission reviewed.  Patient has moved to [place] and in process of locating new cardiologist.   CorvueThoracic impedancesuggesting fluid levels returned to normal.   Prescribed:   Torsemide20mg Take1.5tablets (30 mg total) by mouth daily.   Potassium 20 mEq take1tablet (23mEq total)by mouth daily  Spironolactone 25 mg take0.5tablet (12.5 mg total)at bedtime  Farxiga 10 mg take 1 tablet daily  Labs: 09/01/2020 Creatinine 1.4, BUN 44, Potassium 4.2, Sodium 136, GFR 50 Care Everywhere 06/15/2020 Creatinine 1.41, BUN 37, Potassium 4.6, Sodium 137, GFR 50 A complete set of results can be found in Results Review.  Recommendations: Left voice mail with ICM number and encouraged to call if experiencing any fluid symptoms.  Follow-up plan: ICM clinic phone appointment on6/27/2022. 91 day device clinic remote transmission6/10/2020.  EP/Cardiology Office Visits: Last EP visit was3/14/2022 with Dr Marge Duncans recall for 2023).   Copy of ICM check sent to Dr.Taylor.  3 month ICM trend: 10/26/2020.    1 Year ICM trend:       [name], RN 10/27/2020 12:42 PM

## 2020-10-30 ENCOUNTER — Ambulatory Visit (INDEPENDENT_AMBULATORY_CARE_PROVIDER_SITE_OTHER): Payer: Medicare HMO

## 2020-10-30 DIAGNOSIS — I442 Atrioventricular block, complete: Secondary | ICD-10-CM | POA: Diagnosis not present

## 2020-10-30 LAB — CUP PACEART REMOTE DEVICE CHECK
Battery Remaining Longevity: 55 mo
Battery Remaining Percentage: 95.5 %
Battery Voltage: 2.96 V
Brady Statistic AP VP Percent: 98 %
Brady Statistic AP VS Percent: 1 %
Brady Statistic AS VP Percent: 2 %
Brady Statistic AS VS Percent: 1 %
Brady Statistic RA Percent Paced: 98 %
Date Time Interrogation Session: 20220606020013
Implantable Lead Implant Date: 20121031
Implantable Lead Implant Date: 20121031
Implantable Lead Implant Date: 20201209
Implantable Lead Implant Date: 20201209
Implantable Lead Location: 753858
Implantable Lead Location: 753859
Implantable Lead Location: 753860
Implantable Lead Location: 753862
Implantable Lead Model: 3830
Implantable Pulse Generator Implant Date: 20201209
Lead Channel Impedance Value: 290 Ohm
Lead Channel Impedance Value: 390 Ohm
Lead Channel Impedance Value: 440 Ohm
Lead Channel Pacing Threshold Amplitude: 0.75 V
Lead Channel Pacing Threshold Amplitude: 0.75 V
Lead Channel Pacing Threshold Amplitude: 1 V
Lead Channel Pacing Threshold Pulse Width: 0.5 ms
Lead Channel Pacing Threshold Pulse Width: 0.8 ms
Lead Channel Pacing Threshold Pulse Width: 1 ms
Lead Channel Sensing Intrinsic Amplitude: 2.1 mV
Lead Channel Setting Pacing Amplitude: 2 V
Lead Channel Setting Pacing Amplitude: 2 V
Lead Channel Setting Pacing Amplitude: 2.5 V
Lead Channel Setting Pacing Pulse Width: 0.8 ms
Lead Channel Setting Pacing Pulse Width: 1 ms
Lead Channel Setting Sensing Sensitivity: 4 mV
Pulse Gen Model: 3222
Pulse Gen Serial Number: 9138824

## 2020-11-01 ENCOUNTER — Telehealth: Payer: Self-pay

## 2020-11-01 NOTE — Telephone Encounter (Signed)
Pt transferred care to [facility] in [facility]. I cancelled remotes, marked him inactive in PaceArt. I also released him in [place].

## 2020-11-20 ENCOUNTER — Ambulatory Visit (INDEPENDENT_AMBULATORY_CARE_PROVIDER_SITE_OTHER): Payer: Medicare HMO

## 2020-11-20 DIAGNOSIS — Z950 Presence of cardiac pacemaker: Secondary | ICD-10-CM | POA: Diagnosis not present

## 2020-11-20 DIAGNOSIS — I5032 Chronic diastolic (congestive) heart failure: Secondary | ICD-10-CM | POA: Diagnosis not present

## 2020-11-20 NOTE — Progress Notes (Signed)
EPIC Encounter for ICM Monitoring  Patient Name: David [name] is a 80 y.o. male Date: 11/20/2020 Primary Care Physican: Lajean Manes, MD Primary Cardiologist:  Aundra Dubin Electrophysiologist: Santina Evans Pacing: >99%         11/21/2020 Weight: 156 lbs   AT/AF Burden: <1% (taking Eliquis)                                                            Spoke with patient and heart failure questions reviewed.  Pt asymptomatic for fluid accumulation and feeing well.  He has transferred to [facility].    Corvue Thoracic impedance normal but was suggesting possible fluid accumulation starting 6/25-6/27.    Prescribed:   Torsemide 20 mg Take 1.5 tablets (30 mg total) by mouth daily. Potassium 20 mEq take 1 tablet (37mEq total) by mouth daily Spironolactone 25 mg take 0.5 tablet (12.5 mg total) at bedtime Farxiga 10 mg take 1 tablet daily   Labs: 09/01/2020 Creatinine 1.4,   BUN 44, Potassium 4.2, Sodium 136, GFR 50 Care Everywhere 06/15/2020 Creatinine 1.41, BUN 37, Potassium 4.6, Sodium 137, GFR 50 A complete set of results can be found in Results Review.   Recommendations:  No changes and encouraged to call if experiencing any fluid symptoms.   Follow-up plan: ICM clinic phone appointment on waiting on [facility] clinic to accept transfer.   No further ICM follow up needed since he is transferring to a different clinicl.    EP/Cardiology Office Visits:  Last EP visit was 08/07/2020 with Dr Lovena Le (no recall for 2023).     Copy of ICM check sent to Dr. Lovena Le.     3 month ICM trend: 11/19/2020.   1 year ICM trend:   [name], RN 11/20/2020 12:56 PM

## 2020-11-21 NOTE — Progress Notes (Signed)
Remote pacemaker transmission.   

## 2020-12-06 IMAGING — DX DG CHEST 1V PORT
1 series · 1 of 1 positions shown · non-contrast
Comparison: Chest x-ray 01/31/2016

CLINICAL DATA: Pacemaker insertion.

EXAM:
PORTABLE CHEST 1 VIEW

[chest ap]
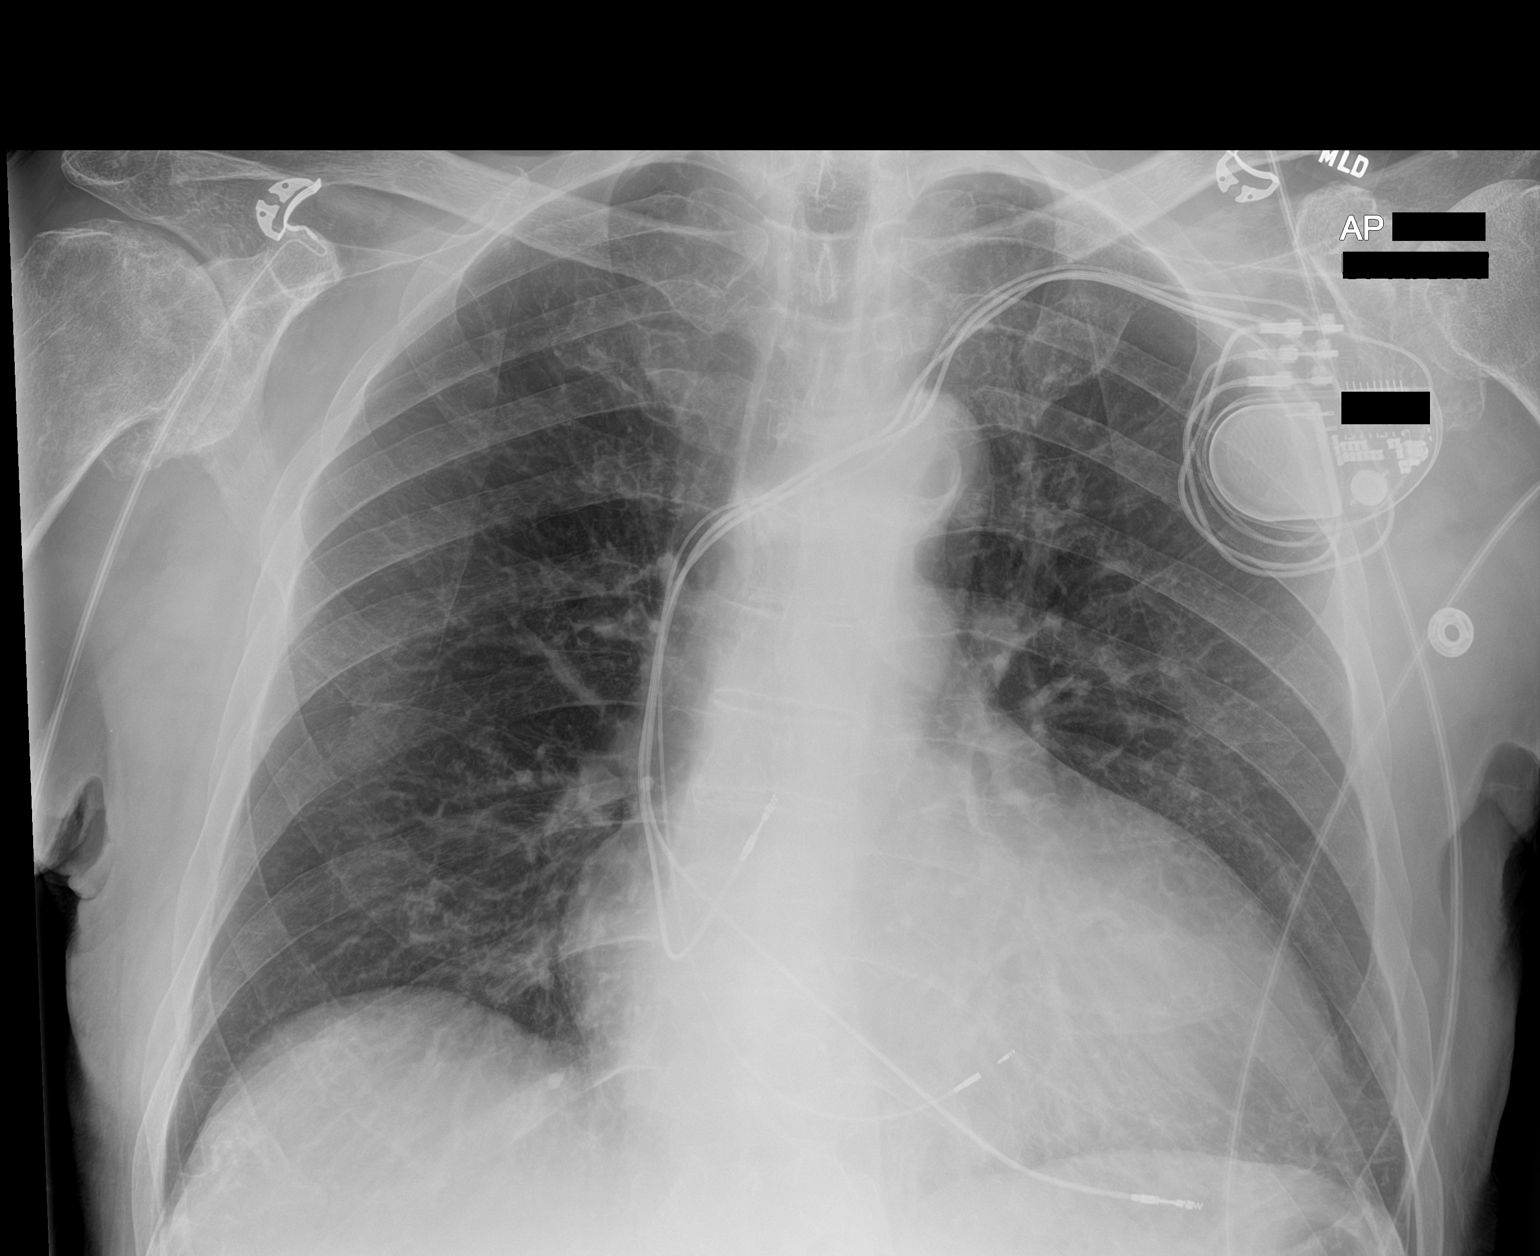

[1 of 1 positions shown; findings below may reference images not displayed]

FINDINGS: New left-sided permanent pacemaker noted with 3 wires. There is a
right atrial wire and 2 ventricular wires. No complicating features
are identified.

The heart is mildly enlarged. There is tortuosity and calcification
of the thoracic aorta.

No acute pulmonary findings. No pleural effusion or pneumothorax. No
worrisome pulmonary lesions.
IMPRESSION: 1. Pacer wires in good position without complicating features.
2. Mild cardiac enlargement.
3. No acute pulmonary findings.

## 2021-05-07 ENCOUNTER — Other Ambulatory Visit (HOSPITAL_COMMUNITY): Payer: Self-pay

## 2021-05-07 ENCOUNTER — Telehealth (HOSPITAL_COMMUNITY): Payer: Self-pay | Admitting: Pharmacy Technician

## 2021-05-07 NOTE — Telephone Encounter (Signed)
Patient Advocate Encounter   Received notification from [employer] that prior authorization for Vyndamax is required.   PA submitted on CoverMyMeds [facility] Status is pending   Will continue to follow.

## 2021-05-07 NOTE — Telephone Encounter (Signed)
Advanced Heart Failure Patient Advocate Encounter  Prior Authorization for David [name] has been approved.    PA# [id number]  Effective dates: 04/07/21 through 05/07/22  Charlann Boxer, CPhT

## 2021-05-29 ENCOUNTER — Other Ambulatory Visit (HOSPITAL_COMMUNITY): Payer: Self-pay | Admitting: Cardiology

## 2021-10-18 ENCOUNTER — Encounter (HOSPITAL_COMMUNITY): Payer: Self-pay

## 2021-10-18 ENCOUNTER — Other Ambulatory Visit: Payer: Self-pay

## 2021-10-18 ENCOUNTER — Emergency Department (HOSPITAL_COMMUNITY): Payer: Medicare HMO

## 2021-10-18 ENCOUNTER — Emergency Department (HOSPITAL_COMMUNITY): Payer: Medicare HMO | Admitting: Certified Registered Nurse Anesthetist

## 2021-10-18 ENCOUNTER — Emergency Department (EMERGENCY_DEPARTMENT_HOSPITAL): Payer: Medicare HMO | Admitting: Certified Registered Nurse Anesthetist

## 2021-10-18 ENCOUNTER — Encounter (HOSPITAL_COMMUNITY): Admission: EM | Disposition: A | Discharge status: Home / Self Care | Payer: Self-pay | Source: Home / Self Care

## 2021-10-18 ENCOUNTER — Inpatient Hospital Stay (HOSPITAL_COMMUNITY)
Admission: EM | Admit: 2021-10-18 | Discharge: 2021-10-24 | DRG: 326 | Disposition: A | Discharge status: Home / Self Care | Payer: Medicare HMO | Attending: General Surgery | Admitting: General Surgery

## 2021-10-18 DIAGNOSIS — I43 Cardiomyopathy in diseases classified elsewhere: Secondary | ICD-10-CM | POA: Diagnosis present

## 2021-10-18 DIAGNOSIS — K255 Chronic or unspecified gastric ulcer with perforation: Secondary | ICD-10-CM

## 2021-10-18 DIAGNOSIS — Z7984 Long term (current) use of oral hypoglycemic drugs: Secondary | ICD-10-CM

## 2021-10-18 DIAGNOSIS — I959 Hypotension, unspecified: Secondary | ICD-10-CM | POA: Diagnosis not present

## 2021-10-18 DIAGNOSIS — Z87891 Personal history of nicotine dependence: Secondary | ICD-10-CM

## 2021-10-18 DIAGNOSIS — R197 Diarrhea, unspecified: Secondary | ICD-10-CM | POA: Diagnosis not present

## 2021-10-18 DIAGNOSIS — Z888 Allergy status to other drugs, medicaments and biological substances status: Secondary | ICD-10-CM | POA: Diagnosis not present

## 2021-10-18 DIAGNOSIS — Z7901 Long term (current) use of anticoagulants: Secondary | ICD-10-CM

## 2021-10-18 DIAGNOSIS — I480 Paroxysmal atrial fibrillation: Secondary | ICD-10-CM | POA: Diagnosis present

## 2021-10-18 DIAGNOSIS — G4730 Sleep apnea, unspecified: Secondary | ICD-10-CM | POA: Diagnosis not present

## 2021-10-18 DIAGNOSIS — R159 Full incontinence of feces: Secondary | ICD-10-CM | POA: Diagnosis not present

## 2021-10-18 DIAGNOSIS — E876 Hypokalemia: Secondary | ICD-10-CM | POA: Diagnosis not present

## 2021-10-18 DIAGNOSIS — M48061 Spinal stenosis, lumbar region without neurogenic claudication: Secondary | ICD-10-CM | POA: Diagnosis present

## 2021-10-18 DIAGNOSIS — E1122 Type 2 diabetes mellitus with diabetic chronic kidney disease: Secondary | ICD-10-CM | POA: Diagnosis present

## 2021-10-18 DIAGNOSIS — Z9049 Acquired absence of other specified parts of digestive tract: Secondary | ICD-10-CM

## 2021-10-18 DIAGNOSIS — E1140 Type 2 diabetes mellitus with diabetic neuropathy, unspecified: Secondary | ICD-10-CM | POA: Diagnosis present

## 2021-10-18 DIAGNOSIS — I442 Atrioventricular block, complete: Secondary | ICD-10-CM | POA: Diagnosis present

## 2021-10-18 DIAGNOSIS — B9681 Helicobacter pylori [H. pylori] as the cause of diseases classified elsewhere: Secondary | ICD-10-CM | POA: Diagnosis present

## 2021-10-18 DIAGNOSIS — Z8249 Family history of ischemic heart disease and other diseases of the circulatory system: Secondary | ICD-10-CM

## 2021-10-18 DIAGNOSIS — I130 Hypertensive heart and chronic kidney disease with heart failure and stage 1 through stage 4 chronic kidney disease, or unspecified chronic kidney disease: Secondary | ICD-10-CM | POA: Diagnosis present

## 2021-10-18 DIAGNOSIS — Z801 Family history of malignant neoplasm of trachea, bronchus and lung: Secondary | ICD-10-CM

## 2021-10-18 DIAGNOSIS — K650 Generalized (acute) peritonitis: Secondary | ICD-10-CM | POA: Diagnosis not present

## 2021-10-18 DIAGNOSIS — Z955 Presence of coronary angioplasty implant and graft: Secondary | ICD-10-CM

## 2021-10-18 DIAGNOSIS — G2581 Restless legs syndrome: Secondary | ICD-10-CM | POA: Diagnosis present

## 2021-10-18 DIAGNOSIS — N1831 Chronic kidney disease, stage 3a: Secondary | ICD-10-CM | POA: Diagnosis present

## 2021-10-18 DIAGNOSIS — I7121 Aneurysm of the ascending aorta, without rupture: Secondary | ICD-10-CM | POA: Diagnosis present

## 2021-10-18 DIAGNOSIS — K3189 Other diseases of stomach and duodenum: Secondary | ICD-10-CM | POA: Diagnosis not present

## 2021-10-18 DIAGNOSIS — E7800 Pure hypercholesterolemia, unspecified: Secondary | ICD-10-CM | POA: Diagnosis present

## 2021-10-18 DIAGNOSIS — K631 Perforation of intestine (nontraumatic): Principal | ICD-10-CM

## 2021-10-18 DIAGNOSIS — E854 Organ-limited amyloidosis: Secondary | ICD-10-CM | POA: Diagnosis present

## 2021-10-18 DIAGNOSIS — I2510 Atherosclerotic heart disease of native coronary artery without angina pectoris: Secondary | ICD-10-CM | POA: Diagnosis present

## 2021-10-18 DIAGNOSIS — Z79899 Other long term (current) drug therapy: Secondary | ICD-10-CM

## 2021-10-18 DIAGNOSIS — Z950 Presence of cardiac pacemaker: Secondary | ICD-10-CM | POA: Diagnosis not present

## 2021-10-18 DIAGNOSIS — I5022 Chronic systolic (congestive) heart failure: Secondary | ICD-10-CM | POA: Diagnosis present

## 2021-10-18 DIAGNOSIS — Z833 Family history of diabetes mellitus: Secondary | ICD-10-CM

## 2021-10-18 HISTORY — PX: LAPAROTOMY: SHX154

## 2021-10-18 LAB — GLUCOSE, CAPILLARY
Glucose-Capillary: 115 mg/dL — ABNORMAL HIGH (ref 70–99)
Glucose-Capillary: 147 mg/dL — ABNORMAL HIGH (ref 70–99)
Glucose-Capillary: 135 mg/dL — ABNORMAL HIGH (ref 70–99)

## 2021-10-18 LAB — CBC WITH DIFFERENTIAL/PLATELET
Abs Immature Granulocytes: 0.01 10*3/uL (ref 0.00–0.07)
Basophils Absolute: 0 10*3/uL (ref 0.0–0.1)
Basophils Relative: 0 %
Eosinophils Absolute: 0 10*3/uL (ref 0.0–0.5)
Eosinophils Relative: 0 %
HCT: 32.6 % — ABNORMAL LOW (ref 39.0–52.0)
Hemoglobin: 10.7 g/dL — ABNORMAL LOW (ref 13.0–17.0)
Immature Granulocytes: 0 %
Lymphocytes Relative: 4 %
Lymphs Abs: 0.4 10*3/uL — ABNORMAL LOW (ref 0.7–4.0)
MCH: 32.1 pg (ref 26.0–34.0)
MCHC: 32.8 g/dL (ref 30.0–36.0)
MCV: 97.9 fL (ref 80.0–100.0)
Monocytes Absolute: 0.3 10*3/uL (ref 0.1–1.0)
Monocytes Relative: 3 %
Neutro Abs: 8.5 10*3/uL — ABNORMAL HIGH (ref 1.7–7.7)
Neutrophils Relative %: 93 %
Platelets: 201 10*3/uL (ref 150–400)
RBC: 3.33 MIL/uL — ABNORMAL LOW (ref 4.22–5.81)
RDW: 14.1 % (ref 11.5–15.5)
WBC: 9.2 10*3/uL (ref 4.0–10.5)
nRBC: 0 % (ref 0.0–0.2)

## 2021-10-18 LAB — TROPONIN I (HIGH SENSITIVITY)
Troponin I (High Sensitivity): 117 ng/L (ref <18)
Troponin I (High Sensitivity): 100 ng/L (ref <18)

## 2021-10-18 LAB — URINALYSIS, ROUTINE W REFLEX MICROSCOPIC
Bilirubin Urine: NEGATIVE
Glucose, UA: 150 mg/dL — AB
Hgb urine dipstick: NEGATIVE
Ketones, ur: NEGATIVE mg/dL
Leukocytes,Ua: NEGATIVE
Nitrite: NEGATIVE
Protein, ur: NEGATIVE mg/dL
Specific Gravity, Urine: 1.013 (ref 1.005–1.030)
pH: 5 (ref 5.0–8.0)

## 2021-10-18 LAB — COMPREHENSIVE METABOLIC PANEL
ALT: 21 U/L (ref 0–44)
AST: 30 U/L (ref 15–41)
Albumin: 3.7 g/dL (ref 3.5–5.0)
Alkaline Phosphatase: 84 U/L (ref 38–126)
Anion gap: 10 (ref 5–15)
BUN: 49 mg/dL — ABNORMAL HIGH (ref 8–23)
CO2: 24 mmol/L (ref 22–32)
Calcium: 8.9 mg/dL (ref 8.9–10.3)
Chloride: 102 mmol/L (ref 98–111)
Creatinine, Ser: 1.83 mg/dL — ABNORMAL HIGH (ref 0.61–1.24)
GFR, Estimated: 36 mL/min — ABNORMAL LOW (ref >60)
Glucose, Bld: 149 mg/dL — ABNORMAL HIGH (ref 70–99)
Potassium: 3.7 mmol/L (ref 3.5–5.1)
Sodium: 136 mmol/L (ref 135–145)
Total Bilirubin: 1.9 mg/dL — ABNORMAL HIGH (ref 0.3–1.2)
Total Protein: 6.3 g/dL — ABNORMAL LOW (ref 6.5–8.1)

## 2021-10-18 LAB — TYPE AND SCREEN
ABO/RH(D): O POS
Antibody Screen: NEGATIVE

## 2021-10-18 LAB — LIPASE, BLOOD: Lipase: 39 U/L (ref 11–51)

## 2021-10-18 LAB — LACTIC ACID, PLASMA: Lactic Acid, Venous: 1.2 mmol/L (ref 0.5–1.9)

## 2021-10-18 SURGERY — LAPAROTOMY, EXPLORATORY
Anesthesia: General

## 2021-10-18 MED ORDER — METHOCARBAMOL 1000 MG/10ML IJ SOLN: 500.0000 mg | Freq: Four times a day (QID) | INTRAVENOUS | Status: DC | PRN

## 2021-10-18 MED ORDER — PROPOFOL 10 MG/ML IV BOLUS
INTRAVENOUS | Status: AC
  Filled 2021-10-18: qty 20

## 2021-10-18 MED ORDER — LACTATED RINGERS IV SOLN: INTRAVENOUS | Status: DC

## 2021-10-18 MED ORDER — ALBUMIN HUMAN 5 % IV SOLN: INTRAVENOUS | Status: DC | PRN

## 2021-10-18 MED ORDER — PHENYLEPHRINE 80 MCG/ML (10ML) SYRINGE FOR IV PUSH (FOR BLOOD PRESSURE SUPPORT)
PREFILLED_SYRINGE | INTRAVENOUS | Status: DC | PRN
  Administered 2021-10-18 (×2): 40 ug via INTRAVENOUS

## 2021-10-18 MED ORDER — METOPROLOL TARTRATE 5 MG/5ML IV SOLN: 5.0000 mg | Freq: Four times a day (QID) | INTRAVENOUS | Status: DC | PRN

## 2021-10-18 MED ORDER — LIDOCAINE 2% (20 MG/ML) 5 ML SYRINGE
INTRAMUSCULAR | Status: AC
  Filled 2021-10-18: qty 10

## 2021-10-18 MED ORDER — ONDANSETRON 4 MG PO TBDP: 4.0000 mg | ORAL_TABLET | Freq: Four times a day (QID) | ORAL | Status: DC | PRN

## 2021-10-18 MED ORDER — SODIUM CHLORIDE 0.9 % IV BOLUS
500.0000 mL | Freq: Once | INTRAVENOUS | Status: AC
Start: 2021-10-18 — End: 2021-10-18
  Administered 2021-10-18: 500 mL via INTRAVENOUS

## 2021-10-18 MED ORDER — CHLORHEXIDINE GLUCONATE 0.12 % MT SOLN
OROMUCOSAL | Status: AC
  Filled 2021-10-18: qty 15

## 2021-10-18 MED ORDER — PHENYLEPHRINE HCL-NACL 20-0.9 MG/250ML-% IV SOLN
INTRAVENOUS | Status: DC | PRN
  Administered 2021-10-18: 25 ug/min via INTRAVENOUS

## 2021-10-18 MED ORDER — SODIUM CHLORIDE 0.9 % IV SOLN: Freq: Once | INTRAVENOUS | Status: DC

## 2021-10-18 MED ORDER — ALBUMIN HUMAN 5 % IV SOLN
INTRAVENOUS | Status: AC
  Filled 2021-10-18: qty 250

## 2021-10-18 MED ORDER — PANTOPRAZOLE SODIUM 40 MG IV SOLR
40.0000 mg | Freq: Two times a day (BID) | INTRAVENOUS | Status: DC
  Administered 2021-10-21 – 2021-10-22 (×3): 40 mg via INTRAVENOUS
  Filled 2021-10-18 (×3): qty 10

## 2021-10-18 MED ORDER — FENTANYL CITRATE PF 50 MCG/ML IJ SOSY
50.0000 ug | PREFILLED_SYRINGE | Freq: Once | INTRAMUSCULAR | Status: AC
  Administered 2021-10-18: 50 ug via INTRAVENOUS
  Filled 2021-10-18: qty 1

## 2021-10-18 MED ORDER — DEXAMETHASONE SODIUM PHOSPHATE 10 MG/ML IJ SOLN
INTRAMUSCULAR | Status: AC
  Filled 2021-10-18: qty 2

## 2021-10-18 MED ORDER — CHLORHEXIDINE GLUCONATE 0.12 % MT SOLN
15.0000 mL | Freq: Once | OROMUCOSAL | Status: DC
  Filled 2021-10-18: qty 15

## 2021-10-18 MED ORDER — HEPARIN SODIUM (PORCINE) 5000 UNIT/ML IJ SOLN
5000.0000 [IU] | Freq: Three times a day (TID) | INTRAMUSCULAR | Status: DC
  Administered 2021-10-19 – 2021-10-22 (×10): 5000 [IU] via SUBCUTANEOUS
  Filled 2021-10-18 (×10): qty 1

## 2021-10-18 MED ORDER — ACETAMINOPHEN 10 MG/ML IV SOLN
INTRAVENOUS | Status: AC
  Filled 2021-10-18: qty 100

## 2021-10-18 MED ORDER — ONDANSETRON HCL 4 MG/2ML IJ SOLN
INTRAMUSCULAR | Status: DC | PRN
  Administered 2021-10-18: 4 mg via INTRAVENOUS

## 2021-10-18 MED ORDER — ROCURONIUM BROMIDE 10 MG/ML (PF) SYRINGE
PREFILLED_SYRINGE | INTRAVENOUS | Status: DC | PRN
  Administered 2021-10-18: 70 mg via INTRAVENOUS

## 2021-10-18 MED ORDER — MORPHINE SULFATE (PF) 2 MG/ML IV SOLN
2.0000 mg | INTRAVENOUS | Status: DC | PRN
  Administered 2021-10-18 – 2021-10-22 (×8): 2 mg via INTRAVENOUS
  Filled 2021-10-18 (×8): qty 1

## 2021-10-18 MED ORDER — ACETAMINOPHEN 500 MG PO TABS: 1000.0000 mg | ORAL_TABLET | Freq: Once | ORAL | Status: DC | PRN

## 2021-10-18 MED ORDER — IOHEXOL 9 MG/ML PO SOLN
ORAL | Status: AC
  Filled 2021-10-18: qty 1000

## 2021-10-18 MED ORDER — PIPERACILLIN-TAZOBACTAM 3.375 G IVPB
3.3750 g | Freq: Three times a day (TID) | INTRAVENOUS | Status: DC
  Administered 2021-10-18 – 2021-10-23 (×15): 3.375 g via INTRAVENOUS
  Filled 2021-10-18 (×15): qty 50

## 2021-10-18 MED ORDER — PROPOFOL 10 MG/ML IV BOLUS
INTRAVENOUS | Status: DC | PRN
  Administered 2021-10-18: 40 mg via INTRAVENOUS

## 2021-10-18 MED ORDER — ACETAMINOPHEN 10 MG/ML IV SOLN
1000.0000 mg | Freq: Four times a day (QID) | INTRAVENOUS | Status: AC
  Administered 2021-10-18 (×3): 1000 mg via INTRAVENOUS
  Filled 2021-10-18 (×3): qty 100

## 2021-10-18 MED ORDER — FENTANYL CITRATE (PF) 250 MCG/5ML IJ SOLN
INTRAMUSCULAR | Status: AC
  Filled 2021-10-18: qty 5

## 2021-10-18 MED ORDER — ALBUMIN HUMAN 5 % IV SOLN
12.5000 g | Freq: Once | INTRAVENOUS | Status: AC
Start: 2021-10-18 — End: 2021-10-18
  Administered 2021-10-18: 12.5 g via INTRAVENOUS

## 2021-10-18 MED ORDER — ACETAMINOPHEN 10 MG/ML IV SOLN
1000.0000 mg | Freq: Once | INTRAVENOUS | Status: DC | PRN
  Administered 2021-10-18: 1000 mg via INTRAVENOUS

## 2021-10-18 MED ORDER — PANTOPRAZOLE INFUSION (NEW) - SIMPLE MED
8.0000 mg/h | INTRAVENOUS | Status: DC
  Administered 2021-10-18 – 2021-10-20 (×4): 8 mg/h via INTRAVENOUS
  Filled 2021-10-18: qty 100
  Filled 2021-10-18: qty 80
  Filled 2021-10-18 (×2): qty 100
  Filled 2021-10-18 (×3): qty 80

## 2021-10-18 MED ORDER — ROCURONIUM BROMIDE 10 MG/ML (PF) SYRINGE
PREFILLED_SYRINGE | INTRAVENOUS | Status: AC
  Filled 2021-10-18: qty 20

## 2021-10-18 MED ORDER — LIDOCAINE 2% (20 MG/ML) 5 ML SYRINGE
INTRAMUSCULAR | Status: DC | PRN
  Administered 2021-10-18: 40 mg via INTRAVENOUS

## 2021-10-18 MED ORDER — 0.9 % SODIUM CHLORIDE (POUR BTL) OPTIME
TOPICAL | Status: DC | PRN
  Administered 2021-10-18: 2000 mL
  Administered 2021-10-18: 1000 mL

## 2021-10-18 MED ORDER — KCL IN DEXTROSE-NACL 20-5-0.45 MEQ/L-%-% IV SOLN
INTRAVENOUS | Status: DC
  Filled 2021-10-18 (×3): qty 1000

## 2021-10-18 MED ORDER — ACETAMINOPHEN 160 MG/5ML PO SOLN: 1000.0000 mg | Freq: Once | ORAL | Status: DC | PRN

## 2021-10-18 MED ORDER — PANTOPRAZOLE 80MG IVPB - SIMPLE MED
80.0000 mg | Freq: Once | INTRAVENOUS | Status: AC
  Administered 2021-10-18: 80 mg via INTRAVENOUS
  Filled 2021-10-18: qty 80

## 2021-10-18 MED ORDER — SUGAMMADEX SODIUM 200 MG/2ML IV SOLN
INTRAVENOUS | Status: DC | PRN
Start: 2021-10-18 — End: 2021-10-18
  Administered 2021-10-18: 200 mg via INTRAVENOUS

## 2021-10-18 MED ORDER — FENTANYL CITRATE (PF) 100 MCG/2ML IJ SOLN: 25.0000 ug | INTRAMUSCULAR | Status: DC | PRN

## 2021-10-18 MED ORDER — PIPERACILLIN-TAZOBACTAM 3.375 G IVPB 30 MIN
3.3750 g | Freq: Once | INTRAVENOUS | Status: AC
  Administered 2021-10-18: 3.375 g via INTRAVENOUS
  Filled 2021-10-18: qty 50

## 2021-10-18 MED ORDER — ONDANSETRON HCL 4 MG/2ML IJ SOLN
INTRAMUSCULAR | Status: AC
  Filled 2021-10-18: qty 4

## 2021-10-18 MED ORDER — FENTANYL CITRATE (PF) 250 MCG/5ML IJ SOLN
INTRAMUSCULAR | Status: DC | PRN
  Administered 2021-10-18 (×3): 25 ug via INTRAVENOUS
  Administered 2021-10-18: 75 ug via INTRAVENOUS

## 2021-10-18 MED ORDER — DEXAMETHASONE SODIUM PHOSPHATE 10 MG/ML IJ SOLN
INTRAMUSCULAR | Status: DC | PRN
Start: 2021-10-18 — End: 2021-10-18
  Administered 2021-10-18: 10 mg via INTRAVENOUS

## 2021-10-18 MED ORDER — PHENYLEPHRINE 80 MCG/ML (10ML) SYRINGE FOR IV PUSH (FOR BLOOD PRESSURE SUPPORT)
PREFILLED_SYRINGE | INTRAVENOUS | Status: AC
  Filled 2021-10-18: qty 10

## 2021-10-18 MED ORDER — LACTATED RINGERS IV SOLN: INTRAVENOUS | Status: DC | PRN

## 2021-10-18 MED ORDER — PHENOL 1.4 % MT LIQD
1.0000 | OROMUCOSAL | Status: DC | PRN
Start: 2021-10-18 — End: 2021-10-24
  Administered 2021-10-18: 1 via OROMUCOSAL
  Filled 2021-10-18: qty 177

## 2021-10-18 MED ORDER — ONDANSETRON HCL 4 MG/2ML IJ SOLN
4.0000 mg | Freq: Four times a day (QID) | INTRAMUSCULAR | Status: DC | PRN
  Administered 2021-10-19: 4 mg via INTRAVENOUS
  Filled 2021-10-18: qty 2

## 2021-10-18 MED ORDER — ORAL CARE MOUTH RINSE: 15.0000 mL | Freq: Once | OROMUCOSAL | Status: DC

## 2021-10-18 SURGICAL SUPPLY — 46 items
BAG COUNTER SPONGE SURGICOUNT (BAG) ×2 IMPLANT
BLADE CLIPPER SURG (BLADE) IMPLANT
CANISTER SUCT 3000ML PPV (MISCELLANEOUS) ×1 IMPLANT
CELLS DAT CNTRL 66122 CELL SVR (MISCELLANEOUS) ×1 IMPLANT
CHLORAPREP W/TINT 26 (MISCELLANEOUS) ×2 IMPLANT
COVER SURGICAL LIGHT HANDLE (MISCELLANEOUS) ×2 IMPLANT
DRAIN CHANNEL 19F RND (DRAIN) ×1 IMPLANT
DRAPE LAPAROSCOPIC ABDOMINAL (DRAPES) ×2 IMPLANT
DRAPE WARM FLUID 44X44 (DRAPES) ×2 IMPLANT
DRSG OPSITE POSTOP 4X10 (GAUZE/BANDAGES/DRESSINGS) IMPLANT
DRSG OPSITE POSTOP 4X6 (GAUZE/BANDAGES/DRESSINGS) ×1 IMPLANT
DRSG OPSITE POSTOP 4X8 (GAUZE/BANDAGES/DRESSINGS) IMPLANT
ELECT BLADE 6.5 EXT (BLADE) IMPLANT
ELECT CAUTERY BLADE 6.4 (BLADE) ×2 IMPLANT
ELECT REM PT RETURN 9FT ADLT (ELECTROSURGICAL) ×2
ELECTRODE REM PT RTRN 9FT ADLT (ELECTROSURGICAL) ×1 IMPLANT
EVACUATOR SILICONE 100CC (DRAIN) ×1 IMPLANT
GLOVE BIO SURGEON STRL SZ 6 (GLOVE) ×2 IMPLANT
GLOVE INDICATOR 6.5 STRL GRN (GLOVE) ×2 IMPLANT
GOWN STRL REUS W/ TWL LRG LVL3 (GOWN DISPOSABLE) ×2 IMPLANT
GOWN STRL REUS W/TWL LRG LVL3 (GOWN DISPOSABLE) ×2
HANDLE SUCTION POOLE (INSTRUMENTS) ×1 IMPLANT
KIT BASIN OR (CUSTOM PROCEDURE TRAY) ×2 IMPLANT
KIT TURNOVER KIT B (KITS) ×2 IMPLANT
LIGASURE IMPACT 36 18CM CVD LR (INSTRUMENTS) ×1 IMPLANT
MANIFOLD NEPTUNE II (INSTRUMENTS) ×1 IMPLANT
NS IRRIG 1000ML POUR BTL (IV SOLUTION) ×4 IMPLANT
PACK GENERAL/GYN (CUSTOM PROCEDURE TRAY) ×2 IMPLANT
PAD ARMBOARD 7.5X6 YLW CONV (MISCELLANEOUS) ×2 IMPLANT
RETRACTOR WND ALEXIS 18 MED (MISCELLANEOUS) IMPLANT
RTRCTR WOUND ALEXIS 18CM MED (MISCELLANEOUS) ×2
SPECIMEN JAR LARGE (MISCELLANEOUS) IMPLANT
SPONGE T-LAP 18X18 ~~LOC~~+RFID (SPONGE) IMPLANT
STAPLER VISISTAT 35W (STAPLE) ×2 IMPLANT
SUCTION POOLE HANDLE (INSTRUMENTS) ×2
SUT ETHILON 2 0 FS 18 (SUTURE) ×1 IMPLANT
SUT PDS AB 1 TP1 96 (SUTURE) ×4 IMPLANT
SUT SILK 2 0 SH CR/8 (SUTURE) ×2 IMPLANT
SUT SILK 2 0 TIES 10X30 (SUTURE) ×2 IMPLANT
SUT SILK 3 0 SH CR/8 (SUTURE) ×2 IMPLANT
SUT SILK 3 0 TIES 10X30 (SUTURE) ×2 IMPLANT
SUT VIC AB 3-0 SH 18 (SUTURE) IMPLANT
SUT VIC AB 3-0 SH 27 (SUTURE) ×1
SUT VIC AB 3-0 SH 27XBRD (SUTURE) IMPLANT
TOWEL GREEN STERILE (TOWEL DISPOSABLE) ×2 IMPLANT
TRAY FOLEY MTR SLVR 16FR STAT (SET/KITS/TRAYS/PACK) ×1 IMPLANT

## 2021-10-18 NOTE — ED Triage Notes (Signed)
Pt BIB EMS from home. Per EMS pt was moving sheets around 2am when he began having sharp left sided abdominal pain. Pt denies N/V/D - last BM was yesterday. Pt with cardiac hx, took one nitro PTA to see if he would have any relief - did not relieve his pain.  VS with EMS  160/80 HR 82 98% RA

## 2021-10-18 NOTE — Op Note (Signed)
Operative Note  David [name]  [id number]  [id number]  10/18/2021   Surgeon: Romana Juniper MD FACS   Assistant: Nicanor Alcon MD ([id number]) I was personally present and scrubbed during the entirety of this procedure as documented in my operative note.  Procedure performed: Exploratory laparotomy, omental pedicle flap Phillip Heal patch) repair of perforated prepyloric ulcer   Preop diagnosis: Perforated viscus Post-op diagnosis/intraop findings: 4 mm clean edged perforated prepyloric ulcer, uncontained with free-flowing gastric and bilious secretions in the abdominal cavity, purulent peritonitis   Specimens: no Retained items: 57f round Blake drain EBL: minimal cc Complications: none   Description of procedure: After obtaining informed consent the patient was taken to the operating room and placed supine on operating room table where general endotracheal anesthesia was initiated, preoperative antibiotics were administered, SCDs applied, and a formal timeout was performed.  Foley catheter was inserted.  The abdomen was prepped and draped in usual sterile fashion.  An upper midline laparotomy was created and upon entering the peritoneal cavity, purulent peritonitis was noted in the upper fields as well as bilious fluid emanating from a proximately 4 mm clean edged perforated ulcer on the anterior prepyloric stomach.  This was all evacuated with the suction.  The falciform was taken down with the LigaSure were and a generous omental pedicle was created again using the LigaSure.  4 simple interrupted 2-0 silk sutures were placed across the perforation and then the omental pedicle flap was brought up to cover the perforation.  The silks were tied down around the omentum with excellent apposition.  The NG tube was confirmed to be in adequate position in the stomach, with the tip just proximal to the repair.  The abdominal cavity was then irrigated with 2 L of warm sterile saline; the effluent was  completely clear.  A 19 French round like drain was introduced through the right lower quadrant and placed underneath the edge of the liver overlying the repair.  This was secured to the skin with a 2-0 nylon and placed to bulb suction ultimately.  Hemostasis was excellent.  The fascia was closed with running looped #1 PDS starting at either end and tying centrally.  This was tacked down with a 3-0 Vicryl in the subcu.  The skin was then loosely reapproximated with staples and a honeycomb dressing applied.  A sterile dressing was applied to the drain site. The patient was then awakened, extubated and taken to PACU in stable condition.    All counts were correct at the completion of the case.

## 2021-10-18 NOTE — Anesthesia Procedure Notes (Signed)
Procedure Name: Intubation Date/Time: 10/18/2021 9:21 AM Performed by: Carolan Clines, CRNA Pre-anesthesia Checklist: Patient identified, Emergency Drugs available, Suction available and Patient being monitored Patient Re-evaluated:Patient Re-evaluated prior to induction Oxygen Delivery Method: Circle System Utilized Preoxygenation: Pre-oxygenation with 100% oxygen Induction Type: IV induction Ventilation: Mask ventilation without difficulty Laryngoscope Size: Mac and 4 Grade View: Grade I Tube type: Oral Tube size: 7.5 mm Number of attempts: 1 Airway Equipment and Method: Stylet Placement Confirmation: ETT inserted through vocal cords under direct vision, positive ETCO2 and breath sounds checked- equal and bilateral Secured at: 23 cm Tube secured with: Tape Dental Injury: Teeth and Oropharynx as per pre-operative assessment

## 2021-10-18 NOTE — ED Notes (Signed)
MD Horton notified of pt BP - fluids to be ordered

## 2021-10-18 NOTE — H&P (Addendum)
Surgical Evaluation  Chief Complaint: abdominal pain  HPI: [name] pleasant 81yo man with history of CHF (EF ~35%) from cardiac amyloidosis, HTN, DM, pacemaker as well as other rmedical issues listed below  who presents with acute abdominal pain which began early this morning and was more in the LUQ/ left lower chest. He could not get comfortable and noted pain worse with coughing, deep breathing, or laughing as well as moving. He tried a tylenol with minimal relief, but was able to sleep for another hour until the pain became too severe to manage. Denies associated nausea/vomiting, but does report a history of epigastric discomfort and possibly melenotic stools over the last few weeks. Reports a slowly evolving decreased appetite/ change in taste. No NSAID use or other significant risk factors, no prior ulcer history.  Previous open cholecystectomy and open appendectomy for ruptured appendicitis as a child, prior bilateral inguinal hernia repairs.   Allergies  Allergen Reactions   Benadryl [Diphenhydramine Hcl] Other (See Comments)    Adverse reaction, has very strange behavior. Also hyperactive.   Lisinopril Other (See Comments)    hypotensive   Restoril [Temazepam] Other (See Comments)    Hyperactive    Past Medical History:  Diagnosis Date   Aortic stenosis    a.mild-mod by echo 06/2017.   Ascending aorta dilatation (HCC)    a. mildly dilated aortic root and mod dilated ascending aorta by echo 06/2017.   Chronic diastolic CHF (congestive heart failure) (HCC)    CKD (chronic kidney disease), stage III (HCC)    Complete heart block (HCC)    a. s/p STJ pacemaker in 02/2011 - Dr. [name]   Coronary artery disease    a. LAD atherectomy 1999.   Gilbert's syndrome    Hypertension    Insomnia, unspecified    Pacemaker-[facility] 07/16/2011   PAF (paroxysmal atrial fibrillation) ([place])    a. identified on pacemaker interrogation; on Eliquis.   Peripheral neuropathy    Pure hypercholesterolemia     Restless leg syndrome    Spermatocele    near left testicle 2 cm   Spinal stenosis, lumbar    Thrombocytopenia ([place])    Averaging 140,000 since 2008   Tubular adenoma 09/2012   Dr. Watt Climes repeat 4 years   Type II or unspecified type diabetes mellitus without mention of complication, not stated as uncontrolled    Unspecified sleep apnea     Past Surgical History:  Procedure Laterality Date   [facility] Bilateral    LEAD INSERTION N/A 05/05/2019   Procedure: LEAD INSERTION;  Surgeon: Evans Lance, MD;  Location: [place] [facility];  Service: Cardiovascular;  Laterality: N/A;   pacermaker  07/16/11   Dr. [name]   [facility] GENERATOR CHANGEOUT N/A 05/05/2019   Procedure: PPM GENERATOR CHANGEOUT;  Surgeon: Evans Lance, MD;  Location: [place] [facility];  Service: Cardiovascular;  Laterality: N/A;   TEE WITHOUT CARDIOVERSION N/A 06/14/2014   Procedure: TRANSESOPHAGEAL ECHOCARDIOGRAM (TEE);  Surgeon: [name], MD;  Location: [facility] ENDOSCOPY;  Service: Cardiovascular;  Laterality: N/A;    Family History  Problem Relation Age of Onset   Cancer Mother        Small cell lung cancer   Diabetes Father        DM   CAD Father    Hypertension Father    Heart disease Father  Heart attack Father    Hypertension Sister     Social History   Socioeconomic History   Marital status: Married    Spouse name: Not on file   Number of children: Not on file   Years of education: Not on file   Highest education level: Not on file  Occupational History   Not on file  Tobacco Use   Smoking status: Former   Smokeless tobacco: Never  Vaping Use   Vaping Use: Never used  Substance and Sexual Activity   Alcohol use: Yes    Comment: occasional glass of wine   Drug use: No   Sexual activity: Not on file  Other Topics Concern   Not on file  Social History Narrative   Not on file   Social  Determinants of Health   Financial Resource Strain: Not on file  Food Insecurity: Not on file  Transportation Needs: Not on file  Physical Activity: Not on file  Stress: Not on file  Social Connections: Not on file    No current facility-administered medications on file prior to encounter.   Current Outpatient Medications on File Prior to Encounter  Medication Sig Dispense Refill   acetaminophen (TYLENOL) 500 MG tablet Take 1,000 mg by mouth every 4 (four) hours as needed for mild pain or headache.     apixaban (ELIQUIS) 2.5 MG TABS tablet Take 1 tablet (2.5 mg total) by mouth 2 (two) times daily. 30 tablet 0   ascorbic acid (VITAMIN C) 500 MG tablet Take 500 mg by mouth daily. Pt states taking 3/week     Cholecalciferol (VITAMIN D) 50 MCG (2000 UT) tablet Take 2,000 Units by mouth daily.     Cyanocobalamin (VITAMIN B-12) 2500 MCG SUBL Place 2,500 mcg under the tongue daily.      FARXIGA 10 MG TABS tablet TAKE 1 TABLET(10 MG) BY MOUTH DAILY BEFORE BREAKFAST (Patient taking differently: Take 10 mg by mouth daily.) 30 tablet 6   fluticasone (FLONASE) 50 MCG/ACT nasal spray Place 1 spray into both nostrils daily as needed for allergies or rhinitis.     loratadine (CLARITIN) 10 MG tablet Take 10 mg by mouth daily as needed for allergies.     Multiple Vitamins-Minerals (PRESERVISION AREDS 2) CAPS Take 1 capsule by mouth daily.     nitroGLYCERIN (NITROSTAT) 0.4 MG SL tablet Place 1 tablet (0.4 mg total) under the tongue every 5 (five) minutes as needed. For chest pain 25 tablet 5   NON FORMULARY BIPAP at bedtime     Polyvinyl Alcohol-Povidone (REFRESH OP) Place 1 drop into both eyes daily as needed (dry eyes).     potassium chloride (KLOR-CON M) 10 MEQ tablet Take 10 mEq by mouth 2 (two) times daily.     pramipexole (MIRAPEX) 1 MG tablet Take 2 mg by mouth at bedtime.     simvastatin (ZOCOR) 10 MG tablet Take 10 mg by mouth every evening.     spironolactone (ALDACTONE) 25 MG tablet Take 0.5  tablets (12.5 mg total) by mouth at bedtime. (Patient taking differently: Take 25 mg by mouth at bedtime.) 45 tablet 3   torsemide (DEMADEX) 20 MG tablet Take 1.5 tablets (30 mg total) by mouth daily. (Patient taking differently: Take 40 mg by mouth 2 (two) times daily. 9 am and 5 pm) 60 tablet 3   triamcinolone cream (KENALOG) 0.1 % Apply 1 application topically 2 (two) times daily as needed (itching).     VYNDAMAX 61 MG CAPS TAKE 1 CAPSULE (  61 MG) DAILY (Patient taking differently: Take 61 mg by mouth daily.) 90 capsule 3   Fluorouracil (TOLAK) 4 % CREA Apply 1 application topically at bedtime. (Patient taking differently: Apply 1 application. topically at bedtime as needed (scalp irritation).) 40 g 1   metoprolol succinate (TOPROL XL) 25 MG 24 hr tablet Take 0.5 tablets (12.5 mg total) by mouth at bedtime. (Patient not taking: Reported on 10/18/2021) 45 tablet 3   sildenafil (VIAGRA) 50 MG tablet Take 25-50 mg by mouth daily as needed for erectile dysfunction.      Review of Systems: a complete, 10pt review of systems was completed with pertinent positives and negatives as documented in the HPI  Physical Exam: Vitals:   10/18/21 0615 10/18/21 0636  BP: (!) 86/53 (!) 93/58  Pulse: 80 80  Resp: 14 15  Temp:    SpO2: 99% 100%   Gen: A&Ox3, no distress  Eyes: lids and conjunctivae normal, no icterus. Pupils equally round and reactive to light.  Chest: respiratory effort is normal.  Cardiovascular: RRR with palpable distal pulses, no pedal edema Gastrointestinal: soft, distended, mildly tender but moreso in epigastrium/ upper abdomen. No guarding but mild rebound tenderness. Well healed R subcostal and RLQ incisions.  Muscoloskeletal: no clubbing or cyanosis of the fingers.   Neuro: grossly normal Psych: appropriate mood and affect, normal insight/judgment intact  Skin: warm and dry      Latest Ref Rng & Units 10/18/2021    5:30 AM 06/15/2020    1:36 PM 12/14/2019    1:05 PM  CBC  WBC  4.0 - 10.5 K/uL 9.2   7.5   6.7    Hemoglobin 13.0 - 17.0 g/dL 10.7   13.7   13.3    Hematocrit 39.0 - 52.0 % 32.6   40.3   41.1    Platelets 150 - 400 K/uL 201   152   148         Latest Ref Rng & Units 10/18/2021    5:30 AM 06/15/2020    1:36 PM 03/15/2020   12:40 PM  CMP  Glucose 70 - 99 mg/dL 149   101   114    BUN 8 - 23 mg/dL 49   37   49    Creatinine 0.61 - 1.24 mg/dL 1.83   1.41   1.61    Sodium 135 - 145 mmol/L 136   137   136    Potassium 3.5 - 5.1 mmol/L 3.7   4.6   4.0    Chloride 98 - 111 mmol/L 102   100   97    CO2 22 - 32 mmol/L '24   27   26    '$ Calcium 8.9 - 10.3 mg/dL 8.9   9.0   9.3    Total Protein 6.5 - 8.1 g/dL 6.3      Total Bilirubin 0.3 - 1.2 mg/dL 1.9      Alkaline Phos 38 - 126 U/L 84      AST 15 - 41 U/L 30      ALT 0 - 44 U/L 21        Lab Results  Component Value Date   INR 1.11 03/27/2011    Imaging: DG Chest 2 View  Result Date: 10/18/2021 CLINICAL DATA:  Lower chest pain. EXAM: CHEST - 2 VIEW COMPARISON:  05/05/2019 FINDINGS: 0625 hours. Low volume film. The lungs are clear without focal pneumonia, edema, pneumothorax or pleural effusion. The cardio pericardial silhouette is  enlarged. Left permanent pacemaker again noted. The visualized bony structures of the thorax are unremarkable. Lucency under each hemidiaphragm is compatible with intraperitoneal free gas. IMPRESSION: 1. No acute cardiopulmonary findings. 2. Lucency under each hemidiaphragm compatible with intraperitoneal free gas. Abdomen pelvis CT with contrast material would likely prove helpful to further evaluate. Critical Value/emergent results were called by telephone at the time of interpretation on 10/18/2021 at 6:43 am to provider [facility] , who verbally acknowledged these results. Electronically Signed   By: Misty Stanley M.D.   On: 10/18/2021 06:45     A/P: Pneumoperitoneum, by history likely perforated ulcer. He is drinking contrast now for CT- I have discussed with EDP to  try and expedite scan, does not need to finish the contrast necessarily. I recommend exploratory laparotomy and likely graham patch, and we discussed typical post-op course. Questions welcomed and answered. Will plan for OR this morning.     Patient Active Problem List   Diagnosis Date Noted   CSA (central sleep apnea) 05/30/2020   Cardiac amyloidosis ([place]) 05/30/2020   Chronic kidney disease, stage 3a ([place]) 05/30/2020   Hypertensive renal disease 05/30/2020   Type 2 diabetes mellitus with other specified complication ([place]) 96/28/3662   Chronic systolic heart failure ([place]) 05/05/2019   ASV (adaptive servo-ventilation) use counseling 11/16/2018   Neuropathy 12/31/2017   Encounter for counseling on adaptive servo-ventilation (ASV) use 12/31/2017   RLS (restless legs syndrome) 01/29/2017   Central sleep apnea 01/29/2017   Chronic diastolic heart failure ([place]) 09/20/2015   Aortic valve disorder 02/02/2015   Acute heart failure ([place]) 12/29/2014   Paroxysmal atrial fibrillation (HCC)    Chest pressure    Congestive heart disease ([place])    Coronary artery disease involving native coronary artery of native heart with unstable angina pectoris ([place])    CHB (complete heart block) ([place])    Unstable angina pectoris ([place])    Ascending aortic aneurysm ([place])    Anticoagulation adequate with anticoagulant therapy 08/15/2014   Atrial fibrillation ([place]) 07/13/2014   Abnormal echocardiogram 06/14/2014   Coronary atherosclerosis of native coronary artery 08/12/2013   Aneurysm of thoracic aorta ([place]) 08/12/2013   Edema 08/12/2013   Dyslipidemia 07/09/2013   Atrioventricular block, complete ([place]) 07/16/2011   Biventricular cardiac pacemaker in situ 07/16/2011       Romana Juniper, MD [facility]  See AMION to contact appropriate on-call provider

## 2021-10-18 NOTE — Transfer of Care (Signed)
Immediate Anesthesia Transfer of Care Note  Patient: David [name]  Procedure(s) Performed: EXPLORATORY LAPAROTOMY, Omental Pedicle Flap for Perforated Ulcer  Patient Location: PACU  Anesthesia Type:General  Level of Consciousness: awake, alert  and oriented  Airway & Oxygen Therapy: Patient Spontanous Breathing and Patient connected to face mask oxygen  Post-op Assessment: Report given to RN and Post -op Vital signs reviewed and stable  Post vital signs: Reviewed and stable  Last Vitals:  Vitals Value Taken Time  BP 105/60 10/18/21 1022  Temp    Pulse 80   Resp 14 10/18/21 1026  SpO2 100   Vitals shown include unvalidated device data.  Last Pain:  Vitals:   10/18/21 0837  TempSrc: Oral  PainSc: 5          Complications: No notable events documented.

## 2021-10-18 NOTE — ED Notes (Signed)
First bottle of oral contrast given to pt and was instructed to drink within 45 mins

## 2021-10-18 NOTE — ED Provider Notes (Signed)
[facility] EMERGENCY DEPARTMENT Provider Note   CSN: [id number] Arrival date & time: 10/18/21  6144     History  Chief Complaint  Patient presents with   Abdominal Pain    David [name] is a 81 y.o. male.  HPI     This is an 81 year old male with a history hypertension, diabetes, pacemaker who presents with lower chest and abdominal pain.  Patient reports onset this morning.  He slept from around 11:30-1:15.  He got up and moved some heavy sheets on the bed.  When he attempted to get back into bed, he had difficulty and began to lay over the side of the bed.  He had sudden onset of acute left lower chest and left upper abdominal pain.  He states it was worse with certain movements and with coughing and deep breathing.  He denies any injury that he knows of.  He did not fall.  His wife at the bedside states that she woke up to him on the floor moaning in pain.  He states ultimately he was able to get in a position of comfort and slept for an additional hour but the pain returned.  He is not having any nausea or vomiting.  No recent fevers.  No recent upper respiratory symptoms.  He took 2 extra strength Tylenol at home.  Patient does not take any NSAIDs.  Does report some recent epigastric discomfort that he describes as heartburn.  Of note, patient's son is at the bedside and is a Stage manager.  Discussed with the patient, wife, and his son regarding goals of care.  He is full code but states that he would not want life prolonging measures if he had a poor prognosis.  Home Medications Prior to Admission medications   Medication Sig Start Date End Date Taking? Authorizing Provider  acetaminophen (TYLENOL) 500 MG tablet Take 1,000 mg by mouth every 4 (four) hours as needed for mild pain or headache.   Yes [provider]  apixaban (ELIQUIS) 2.5 MG TABS tablet Take 1 tablet (2.5 mg total) by mouth 2 (two) times daily. 04/03/20  Yes Larey Dresser, MD  ascorbic  acid (VITAMIN C) 500 MG tablet Take 500 mg by mouth daily. Pt states taking 3/week   Yes [provider]  Cholecalciferol (VITAMIN D) 50 MCG (2000 UT) tablet Take 2,000 Units by mouth daily.   Yes [provider]  Cyanocobalamin (VITAMIN B-12) 2500 MCG SUBL Place 2,500 mcg under the tongue daily.    Yes [provider]  FARXIGA 10 MG TABS tablet TAKE 1 TABLET(10 MG) BY MOUTH DAILY BEFORE BREAKFAST Patient taking differently: Take 10 mg by mouth daily. 07/03/20  Yes Larey Dresser, MD  fluticasone [facility]) 50 MCG/ACT nasal spray Place 1 spray into both nostrils daily as needed for allergies or rhinitis.   Yes [provider]  loratadine (CLARITIN) 10 MG tablet Take 10 mg by mouth daily as needed for allergies.   Yes [provider]  Multiple Vitamins-Minerals (PRESERVISION AREDS 2) CAPS Take 1 capsule by mouth daily.   Yes [provider]  nitroGLYCERIN (NITROSTAT) 0.4 MG SL tablet Place 1 tablet (0.4 mg total) under the tongue every 5 (five) minutes as needed. For chest pain 01/09/18  Yes Jettie Booze, MD  NON FORMULARY BIPAP at bedtime   Yes [provider]  Polyvinyl Alcohol-Povidone (REFRESH OP) Place 1 drop into both eyes daily as needed (dry eyes).   Yes [provider]  potassium chloride (KLOR-CON M) 10 MEQ tablet Take 10 mEq by mouth 2 (two) times daily.   Yes [provider]  pramipexole (MIRAPEX) 1 MG tablet Take 2 mg by mouth at bedtime.   Yes [provider]  simvastatin (ZOCOR) 10 MG tablet Take 10 mg by mouth every evening.   Yes [provider]  spironolactone (ALDACTONE) 25 MG tablet Take 0.5 tablets (12.5 mg total) by mouth at bedtime. Patient taking differently: Take 25 mg by mouth at bedtime. 07/28/20  Yes Larey Dresser, MD  torsemide (DEMADEX) 20 MG tablet Take 1.5 tablets (30 mg total) by mouth daily. Patient taking differently: Take 40 mg by mouth 2 (two) times daily. 9  am and 5 pm 06/15/20  Yes Larey Dresser, MD  triamcinolone cream (KENALOG) 0.1 % Apply 1 application topically 2 (two) times daily as needed (itching).   Yes [provider]  VYNDAMAX 61 MG CAPS TAKE 1 CAPSULE (61 MG) DAILY Patient taking differently: Take 61 mg by mouth daily. 05/29/21  Yes Larey Dresser, MD  Fluorouracil (TOLAK) 4 % CREA Apply 1 application topically at bedtime. Patient taking differently: Apply 1 application. topically at bedtime as needed (scalp irritation). 06/01/20   Robyne Askew R, PA-C  metoprolol succinate (TOPROL XL) 25 MG 24 hr tablet Take 0.5 tablets (12.5 mg total) by mouth at bedtime. Patient not taking: Reported on 10/18/2021 12/31/19   Larey Dresser, MD  sildenafil (VIAGRA) 50 MG tablet Take 25-50 mg by mouth daily as needed for erectile dysfunction. 06/05/21   [provider]      Allergies    Benadryl [diphenhydramine hcl], Lisinopril, and Restoril [temazepam]    Review of Systems   Review of Systems  Constitutional:  Negative for fever.  Cardiovascular:  Positive for chest pain.  Gastrointestinal:  Positive for abdominal pain.  All other systems reviewed and are negative.  Physical Exam Updated Vital Signs BP (!) 93/58   Pulse 80   Temp (!) 97.3 F (36.3 C) (Oral)   Resp 15   Ht 1.803 m ('5\' 11"'$ )   Wt 68 kg   SpO2 100%   BMI 20.92 kg/m  Physical Exam Vitals and nursing note reviewed.  Constitutional:      Appearance: He is well-developed. He is not ill-appearing.  HENT:     Head: Normocephalic and atraumatic.  Eyes:     Pupils: Pupils are equal, round, and reactive to light.  Cardiovascular:     Rate and Rhythm: Normal rate and regular rhythm.     Heart sounds: Normal heart sounds. No murmur heard.    Comments: Left lower chest wall tenderness palpation, no overlying skin changes or crepitus, pacemaker left upper chest Pulmonary:     Effort: Pulmonary effort is normal. No respiratory distress.     Breath sounds:  Normal breath sounds. No wheezing.  Abdominal:     General: Bowel sounds are normal.     Palpations: Abdomen is soft.     Tenderness: There is generalized abdominal tenderness. There is guarding. There is no rebound.  Musculoskeletal:     Cervical back: Neck supple.  Lymphadenopathy:     Cervical: No cervical adenopathy.  Skin:    General: Skin is warm and dry.  Neurological:     General: No focal deficit present.     Mental Status: He is alert and oriented to person, place, and time.    ED Results / Procedures / Treatments   Labs (all labs  ordered are listed, but only abnormal results are displayed) Labs Reviewed  CBC WITH DIFFERENTIAL/PLATELET - Abnormal; Notable for the following components:      Result Value   RBC 3.33 (*)    Hemoglobin 10.7 (*)    HCT 32.6 (*)    Neutro Abs 8.5 (*)    Lymphs Abs 0.4 (*)    All other components within normal limits  COMPREHENSIVE METABOLIC PANEL - Abnormal; Notable for the following components:   Glucose, Bld 149 (*)    BUN 49 (*)    Creatinine, Ser 1.83 (*)    Total Protein 6.3 (*)    Total Bilirubin 1.9 (*)    GFR, Estimated 36 (*)    All other components within normal limits  URINALYSIS, ROUTINE W REFLEX MICROSCOPIC - Abnormal; Notable for the following components:   Glucose, UA 150 (*)    All other components within normal limits  CULTURE, BLOOD (ROUTINE X 2)  CULTURE, BLOOD (ROUTINE X 2)  LIPASE, BLOOD  LACTIC ACID, PLASMA  LACTIC ACID, PLASMA  TYPE AND SCREEN  TROPONIN I (HIGH SENSITIVITY)    EKG EKG Interpretation  Date/Time:  Thursday Oct 18 2021 05:28:06 EDT Ventricular Rate:  80 PR Interval:    QRS Duration: 178 QT Interval:  459 QTC Calculation: 530 R Axis:   139 Text Interpretation: Accelerated junctional rhythm Right bundle branch block Abnormal T, consider ischemia, lateral leads No significant change since last tracing Confirmed by Thayer Jew [phone]) on 10/18/2021 5:32:30 AM  Radiology DG Chest 2  View  Result Date: 10/18/2021 CLINICAL DATA:  Lower chest pain. EXAM: CHEST - 2 VIEW COMPARISON:  05/05/2019 FINDINGS: 0625 hours. Low volume film. The lungs are clear without focal pneumonia, edema, pneumothorax or pleural effusion. The cardio pericardial silhouette is enlarged. Left permanent pacemaker again noted. The visualized bony structures of the thorax are unremarkable. Lucency under each hemidiaphragm is compatible with intraperitoneal free gas. IMPRESSION: 1. No acute cardiopulmonary findings. 2. Lucency under each hemidiaphragm compatible with intraperitoneal free gas. Abdomen pelvis CT with contrast material would likely prove helpful to further evaluate. Critical Value/emergent results were called by telephone at the time of interpretation on 10/18/2021 at 6:43 am to provider [facility] , who verbally acknowledged these results. Electronically Signed   By: Misty Stanley M.D.   On: 10/18/2021 06:45    Procedures .Critical Care Performed by: Merryl Hacker, MD Authorized by: Merryl Hacker, MD   Critical care provider statement:    Critical care time (minutes):  45   Critical care was necessary to treat or prevent imminent or life-threatening deterioration of the following conditions: Perforated viscus.   Critical care was time spent personally by me on the following activities:  Development of treatment plan with patient or surrogate, discussions with consultants, evaluation of patient's response to treatment, examination of patient, ordering and review of laboratory studies, ordering and review of radiographic studies, ordering and performing treatments and interventions, pulse oximetry, re-evaluation of patient's condition and review of old charts    Medications Ordered in ED Medications  piperacillin-tazobactam (ZOSYN) IVPB 3.375 g (has no administration in time range)  iohexol (OMNIPAQUE) 9 MG/ML oral solution (has no administration in time range)  0.9 %  sodium  chloride infusion (has no administration in time range)  fentaNYL (SUBLIMAZE) injection 50 mcg (50 mcg Intravenous Given 10/18/21 0558)  sodium chloride 0.9 % bolus 500 mL (500 mLs Intravenous New Bag/Given 10/18/21 0610)    ED Course/ Medical Decision Making/  A&P                           Medical Decision Making Amount and/or Complexity of Data Reviewed Labs: ordered. Radiology: ordered.  Risk Prescription drug management.   This patient presents to the ED for concern of abdominal and chest pain, this involves an extensive number of treatment options, and is a complaint that carries with it a high risk of complications and morbidity.  I considered the following differential and admission for this acute, potentially life threatening condition.  The differential diagnosis includes ACS, PE, AAA, SBO, perforated viscus  MDM:    This is an 81 year old male who presents with abdominal pain.  Fairly acute in onset.  He is overall nontoxic-appearing.  He does have tenderness to the lower chest and abdomen with some signs of peritonitis.  Vital signs notable for temperature 97.3 and a blood pressure 99/59.  They state that his blood pressure at baseline is somewhat low with normal in the low 100s.  Labs obtained.  Patient was given a low-dose of fentanyl and 500 cc of fluid.  Last echocardiogram 35%.  Chest x-ray obtained and was independently reviewed by myself.  Free air noted under the diaphragm.  This is highly concerning for perforated viscus.  At this time I do not have a creatinine available.  Discussed utility of IV and oral contrast with the patient and general surgery, Dr. Zenia Resides.  She recommended if we can get some oral contrast that would be reasonable and likely helpful.  This has been ordered.  Patient was ordered some maintenance fluids for pressure support given soft pressures.  Lactate, blood cultures, IV Zosyn ordered.  (Labs, imaging, consults)  Labs: I Ordered, and personally  interpreted labs.  The pertinent results include: CBC, CMP, lipase, troponin, lactate, blood cultures  Imaging Studies ordered: I ordered imaging studies including x-ray with free air noted I independently visualized and interpreted imaging. I agree with the radiologist interpretation  Additional history obtained from wife and son.  External records from outside source obtained and reviewed including prior evaluations  Cardiac Monitoring: The patient was maintained on a cardiac monitor.  I personally viewed and interpreted the cardiac monitored which showed an underlying rhythm of: Paced rhythm  Reevaluation: After the interventions noted above, I reevaluated the patient and found that they have :stayed the same  Social Determinants of Health: Lives independently  Disposition: Admit  Co morbidities that complicate the patient evaluation  Past Medical History:  Diagnosis Date   Aortic stenosis    a.mild-mod by echo 06/2017.   Ascending aorta dilatation (HCC)    a. mildly dilated aortic root and mod dilated ascending aorta by echo 06/2017.   Chronic diastolic CHF (congestive heart failure) (HCC)    CKD (chronic kidney disease), stage III (HCC)    Complete heart block (HCC)    a. s/p STJ pacemaker in 02/2011 - Dr. [name]   Coronary artery disease    a. LAD atherectomy 1999.   Gilbert's syndrome    Hypertension    Insomnia, unspecified    Pacemaker-[employer] 07/16/2011   PAF (paroxysmal atrial fibrillation) ([place])    a. identified on pacemaker interrogation; on Eliquis.   Peripheral neuropathy    Pure hypercholesterolemia    Restless leg syndrome    Spermatocele    near left testicle 2 cm   Spinal stenosis, lumbar    Thrombocytopenia ([place])    Averaging 140,000 since 2008  Tubular adenoma 09/2012   Dr. Watt Climes repeat 4 years   Type II or unspecified type diabetes mellitus without mention of complication, not stated as uncontrolled    Unspecified sleep apnea       Medicines Meds ordered this encounter  Medications   fentaNYL (SUBLIMAZE) injection 50 mcg   sodium chloride 0.9 % bolus 500 mL   piperacillin-tazobactam (ZOSYN) IVPB 3.375 g    Order Specific Question:   Antibiotic Indication:    Answer:   Intra-abdominal Infection   iohexol (OMNIPAQUE) 9 MG/ML oral solution    Idol, Robert L: cabinet override   0.9 %  sodium chloride infusion    I have reviewed the patients home medicines and have made adjustments as needed  Problem List / ED Course: Problem List Items Addressed This Visit   None Visit Diagnoses     Perforated bowel ([place])    -  Primary                   Final Clinical Impression(s) / ED Diagnoses Final diagnoses:  Perforated bowel [facility])    Rx / DC Orders ED Discharge Orders     None         Merryl Hacker, MD 10/18/21 [phone]

## 2021-10-18 NOTE — Anesthesia Preprocedure Evaluation (Signed)
Anesthesia Evaluation  Patient identified by MRN, date of birth, ID band Patient awake    Reviewed: Allergy & Precautions, NPO status , Patient's Chart, lab work & pertinent test results  History of Anesthesia Complications Negative for: history of anesthetic complications  Airway Mallampati: III  TM Distance: >3 FB Neck ROM: Full    Dental  (+) Teeth Intact, Dental Advisory Given   Pulmonary neg shortness of breath, sleep apnea , neg COPD, neg recent URI, former smoker,    breath sounds clear to auscultation       Cardiovascular hypertension, (-) angina+ CAD, + Cardiac Stents and +CHF  + dysrhythmias + pacemaker  Rhythm:Regular  Abbott pacemaker for complete heart block  1. Left ventricular ejection fraction, by estimation, is 35 to 40%. The  left ventricle has moderately decreased function. The left ventricle  demonstrates global hypokinesis. There is moderate concentric left  ventricular hypertrophy. Left ventricular  diastolic parameters are consistent with Grade II diastolic dysfunction  (pseudonormalization). Elevated left ventricular end-diastolic pressure.  The average left ventricular global longitudinal strain is -6.6 %.  2. Right ventricular systolic function is normal. The right ventricular  size is normal. There is normal pulmonary artery systolic pressure.  3. Left atrial size was moderately dilated.  4. Right atrial size was severely dilated.  5. The mitral valve is normal in structure. Mild mitral valve  regurgitation. No evidence of mitral stenosis.  6. The aortic valve is normal in structure. Aortic valve regurgitation is  not visualized. Mild aortic valve stenosis.  7. Aortic dilatation noted. Aneurysm of the ascending aorta, measuring 40  mm. There is mild dilatation at the level of the sinuses of Valsalva  measuring 37 mm.  8. The inferior vena cava is normal in size with greater than 50%   respiratory variability, suggesting right atrial pressure of 3 mmHg.     ? Nuclear stress EF: 40%. ? The left ventricular ejection fraction is moderately decreased (30-44%). ? This is an intermediate risk study.   Abnormal, intermediate risk stress nuclear study with no ischemia or infarction. Gated ejection fraction 40% with global hypokinesis. Study interpreted as intermediate risk due to reduced LV function.    Neuro/Psych  Neuromuscular disease negative psych ROS   GI/Hepatic Neg liver ROS, PUD, perforated bowel   Endo/Other  diabetes  Renal/GU CRFRenal diseaseLab Results      Component                Value               Date                      CREATININE               1.83 (H)            10/18/2021                Musculoskeletal negative musculoskeletal ROS (+)   Abdominal   Peds  Hematology  (+) Blood dyscrasia, anemia , eliquis (last dose 5/4)  Lab Results      Component                Value               Date                      WBC  9.2                 10/18/2021                HGB                      10.7 (L)            10/18/2021                HCT                      32.6 (L)            10/18/2021                MCV                      97.9                10/18/2021                PLT                      201                 10/18/2021              Anesthesia Other Findings Cardiac amloydosis  Reproductive/Obstetrics                             Anesthesia Physical Anesthesia Plan  ASA: 3  Anesthesia Plan: General   Post-op Pain Management:    Induction: Intravenous  PONV Risk Score and Plan: 2 and Ondansetron and Dexamethasone  Airway Management Planned: Oral ETT  Additional Equipment:   Intra-op Plan:   Post-operative Plan: Possible Post-op intubation/ventilation  Informed Consent: I have reviewed the patients History and Physical, chart, labs and discussed the procedure including the  risks, benefits and alternatives for the proposed anesthesia with the patient or authorized representative who has indicated his/her understanding and acceptance.     Dental advisory given  Plan Discussed with: CRNA  Anesthesia Plan Comments:         Anesthesia Quick Evaluation

## 2021-10-18 NOTE — ED Notes (Signed)
Patient transported to CT 

## 2021-10-19 ENCOUNTER — Encounter (HOSPITAL_COMMUNITY): Payer: Self-pay | Admitting: Surgery

## 2021-10-19 DIAGNOSIS — I480 Paroxysmal atrial fibrillation: Secondary | ICD-10-CM

## 2021-10-19 LAB — COMPREHENSIVE METABOLIC PANEL
ALT: 14 U/L (ref 0–44)
AST: 22 U/L (ref 15–41)
Albumin: 3 g/dL — ABNORMAL LOW (ref 3.5–5.0)
Alkaline Phosphatase: 52 U/L (ref 38–126)
Anion gap: 8 (ref 5–15)
BUN: 32 mg/dL — ABNORMAL HIGH (ref 8–23)
CO2: 22 mmol/L (ref 22–32)
Calcium: 8.3 mg/dL — ABNORMAL LOW (ref 8.9–10.3)
Chloride: 105 mmol/L (ref 98–111)
Creatinine, Ser: 1.71 mg/dL — ABNORMAL HIGH (ref 0.61–1.24)
GFR, Estimated: 39 mL/min — ABNORMAL LOW (ref >60)
Glucose, Bld: 170 mg/dL — ABNORMAL HIGH (ref 70–99)
Potassium: 4.3 mmol/L (ref 3.5–5.1)
Sodium: 135 mmol/L (ref 135–145)
Total Bilirubin: 2.2 mg/dL — ABNORMAL HIGH (ref 0.3–1.2)
Total Protein: 5.3 g/dL — ABNORMAL LOW (ref 6.5–8.1)

## 2021-10-19 LAB — GLUCOSE, CAPILLARY
Glucose-Capillary: 138 mg/dL — ABNORMAL HIGH (ref 70–99)
Glucose-Capillary: 144 mg/dL — ABNORMAL HIGH (ref 70–99)
Glucose-Capillary: 173 mg/dL — ABNORMAL HIGH (ref 70–99)
Glucose-Capillary: 181 mg/dL — ABNORMAL HIGH (ref 70–99)
Glucose-Capillary: 181 mg/dL — ABNORMAL HIGH (ref 70–99)

## 2021-10-19 LAB — CBC
HCT: 28.1 % — ABNORMAL LOW (ref 39.0–52.0)
Hemoglobin: 9.5 g/dL — ABNORMAL LOW (ref 13.0–17.0)
MCH: 32.9 pg (ref 26.0–34.0)
MCHC: 33.8 g/dL (ref 30.0–36.0)
MCV: 97.2 fL (ref 80.0–100.0)
Platelets: 156 10*3/uL (ref 150–400)
RBC: 2.89 MIL/uL — ABNORMAL LOW (ref 4.22–5.81)
RDW: 14.5 % (ref 11.5–15.5)
WBC: 12.7 10*3/uL — ABNORMAL HIGH (ref 4.0–10.5)
nRBC: 0 % (ref 0.0–0.2)

## 2021-10-19 LAB — H. PYLORI ANTIBODY, IGG: H Pylori IgG: 4.52 Index Value — ABNORMAL HIGH (ref 0.00–0.79)

## 2021-10-19 MED ORDER — MENTHOL 3 MG MT LOZG
1.0000 | LOZENGE | OROMUCOSAL | Status: DC | PRN
  Filled 2021-10-19: qty 9

## 2021-10-19 MED ORDER — DEXTROSE-NACL 5-0.45 % IV SOLN: INTRAVENOUS | Status: DC

## 2021-10-19 MED ORDER — PRAMIPEXOLE DIHYDROCHLORIDE 1 MG PO TABS
2.0000 mg | ORAL_TABLET | Freq: Every day | ORAL | Status: DC
  Administered 2021-10-19 – 2021-10-23 (×5): 2 mg via ORAL
  Filled 2021-10-19 (×6): qty 2

## 2021-10-19 NOTE — Evaluation (Signed)
Physical Therapy Evaluation Patient Details Name: David [name] MRN: [id number] DOB: 11/03/1940 Today's Date: 10/19/2021  History of Present Illness  [name] pleasant 81yo man with history of CHF (EF ~35%) from cardiac amyloidosis, HTN, DM, pacemaker as well as other rmedical issues listed below  who presents with acute abdominal pain which began early this morning and was more in the LUQ/ left lower chest. He could not get comfortable and noted pain worse with coughing, deep breathing, or laughing as well as moving. He tried a tylenol with minimal relief, but was able to sleep for another hour until the pain became too severe to manage. Patient is s/p exploratory laparotomy, omental pedicle flap Phillip Heal patch) repair of perforated prepyloric ulcer   Clinical Impression  Patient very pleasant and agreeable to PT session. Requires set up assist and supervision for mobility and ambulation in hallway. He ambulated with RW this session, should progress back to no AD. He will continue to benefit from skilled PT follow up to maximize independence and strength.         Recommendations for follow up therapy are one component of a multi-disciplinary discharge planning process, led by the attending physician.  Recommendations may be updated based on patient status, additional functional criteria and insurance authorization.  Follow Up Recommendations No PT follow up    Assistance Recommended at Discharge Intermittent Supervision/Assistance  Patient can return home with the following  A little help with walking and/or transfers;A little help with bathing/dressing/bathroom    Equipment Recommendations None recommended by PT (patient has walker if needed)  Recommendations for Other Services       Functional Status Assessment Patient has had a recent decline in their functional status and demonstrates the ability to make significant improvements in function in a reasonable and predictable amount of time.      Precautions / Restrictions Precautions Precautions: Fall Precaution Comments: NG tube Restrictions Weight Bearing Restrictions: No      Mobility  Bed Mobility Overal bed mobility: Independent                  Transfers Overall transfer level: Independent Equipment used: None                    Ambulation/Gait Ambulation/Gait assistance: Supervision Gait Distance (Feet): 200 Feet Assistive device: Rolling walker (2 wheels) Gait Pattern/deviations: Step-through pattern Gait velocity: normal     General Gait Details: no lob with mobility  Stairs            Wheelchair Mobility    Modified Rankin (Stroke Patients Only)       Balance Overall balance assessment: Modified Independent                                           Pertinent Vitals/Pain Pain Assessment Pain Assessment: Faces Faces Pain Scale: Hurts a little bit Pain Location: abdomen Pain Descriptors / Indicators: Discomfort, Sore Pain Intervention(s): Monitored during session    Home Living Family/patient expects to be discharged to:: Private residence Living Arrangements: Spouse/significant other Available Help at Discharge: Family;Available 24 hours/day               Additional Comments: lives in retirement community with wife in [place]. Was here visiting for grandson's graduation    Prior Function Prior Level of Function : Independent/Modified Independent;Driving  Mobility Comments: I with mobility ADLs Comments: independent     Hand Dominance        Extremity/Trunk Assessment   Upper Extremity Assessment Upper Extremity Assessment: Overall WFL for tasks assessed    Lower Extremity Assessment Lower Extremity Assessment: Overall WFL for tasks assessed    Cervical / Trunk Assessment Cervical / Trunk Assessment: Normal  Communication   Communication: No difficulties  Cognition Arousal/Alertness: Awake/alert Behavior  During Therapy: WFL for tasks assessed/performed Overall Cognitive Status: Within Functional Limits for tasks assessed                                          General Comments      Exercises     Assessment/Plan    PT Assessment Patient needs continued PT services  PT Problem List Decreased strength;Decreased mobility;Decreased activity tolerance       PT Treatment Interventions DME instruction;Gait training;Therapeutic exercise;Stair training;Functional mobility training;Therapeutic activities;Patient/family education    PT Goals (Current goals can be found in the Care Plan section)  Acute Rehab PT Goals Patient Stated Goal: to get better, go home PT Goal Formulation: With patient Time For Goal Achievement: 11/02/21 Potential to Achieve Goals: Good    Frequency       Co-evaluation               AM-PAC PT "6 Clicks" Mobility  Outcome Measure Help needed turning from your back to your side while in a flat bed without using bedrails?: None Help needed moving from lying on your back to sitting on the side of a flat bed without using bedrails?: None Help needed moving to and from a bed to a chair (including a wheelchair)?: A Little Help needed standing up from a chair using your arms (e.g., wheelchair or bedside chair)?: None Help needed to walk in hospital room?: A Little Help needed climbing 3-5 steps with a railing? : A Little 6 Click Score: 21    End of Session Equipment Utilized During Treatment: Gait belt Activity Tolerance: Patient tolerated treatment well Patient left: in chair;with call bell/phone within reach;with chair alarm set Nurse Communication: Mobility status PT Visit Diagnosis: Muscle weakness (generalized) (M62.81);Difficulty in walking, not elsewhere classified (R26.2)    Time: 1610-9604 PT Time Calculation (min) (ACUTE ONLY): 27 min   Charges:   PT Evaluation $PT Eval Moderate Complexity: 1 Mod PT Treatments $Gait  Training: 8-22 mins        Canio Winokur, PT, GCS 10/19/21,12:20 PM

## 2021-10-19 NOTE — Anesthesia Postprocedure Evaluation (Signed)
Anesthesia Post Note  Patient: Nijee Heatwole  Procedure(s) Performed: EXPLORATORY LAPAROTOMY, Omental Pedicle Flap for Perforated Ulcer     Patient location during evaluation: PACU Anesthesia Type: General Level of consciousness: awake and alert Pain management: pain level controlled Vital Signs Assessment: post-procedure vital signs reviewed and stable Respiratory status: spontaneous breathing, nonlabored ventilation, respiratory function stable and patient connected to nasal cannula oxygen Cardiovascular status: blood pressure returned to baseline and stable Postop Assessment: no apparent nausea or vomiting Anesthetic complications: no   No notable events documented.  Last Vitals:  Vitals:   10/19/21 0415 10/19/21 0804  BP: 93/60 113/75  Pulse: 81 80  Resp: 19 17  Temp: 36.4 C 36.6 C  SpO2: 96% 100%    Last Pain:  Vitals:   10/19/21 1529  TempSrc:   PainSc: 7                  Sydne Krahl

## 2021-10-19 NOTE — Progress Notes (Signed)
Post-op foley removed at 0533. Pt educated on importance of reporting measured urine OP if able to urinate by 1130.   JP drain requiring multiple emptying overnight. Total OP 371m overnight and NG canister emptied 351m Pain well controlled with PRN meds

## 2021-10-19 NOTE — Consult Note (Addendum)
Cardiology Consultation:   Patient ID: David [name] MRN: [id number]; DOB: 09/26/1940  Admit date: 10/18/2021 Date of Consult: 10/19/2021  PCP:  Lajean Manes, MD   [facility] Providers Cardiologist:  Larae Grooms, MD       Patient Profile:   David [name] is a 81 y.o. male with a hx of complete heart block s/p PPM, CKD stage III, paroxysmal atrial fibrillation, CAD, chronic systolic heart failure/HFrEF, cardiac amyloidosis, aortic stenosis who is being seen 10/19/2021 for the evaluation of medication assistance at the request of Dr. Kae Heller.  History of Present Illness:   David [name] is an 81 year old male with past medical history above.  He has been followed by Dr. Irish Lack as well as Dr. Lovena Le as an outpatient.  He was followed in the heart failure clinic after referral with concerns for cardiac amyloidosis.  He underwent LAD atherectomy in 1999.  Developed complete heart block in 2012 and underwent PPM placement.  Paroxysmal atrial fibrillation was noted on a device interrogation and he was started on Eliquis as an outpatient.  Had an echocardiogram 04/2019 showing LVEF of 35 to 40%, severe LVH with concern for amyloidosis, moderate RV dysfunction, low gradient moderate aortic stenosis.  Given the concern for cardiac amyloidosis urine immunofixation was done which was negative and serum myeloma panel was done which was negative.  PYP scan was suggested of cardiac amyloidosis.  TTR amyloidosis genetic testing was negative, suggesting a wild-type disease.   Had a [employer] 06/2019 showing LVEF of 40%, no ischemia or infarction.  Echo 12/2019 with LVEF of 35 to 40%, moderate LVH, normal RV and mild aortic stenosis.  He was seen in the advanced heart failure clinic 07/2020 and reported being in his usual state of health.  Stated him and his wife would be moving to [facility] in several weeks.  Home medication regimen included metoprolol XL 12.'5mg'$  daily, spironolactone  12.5 mg daily, Farxiga 10 mg daily, torsemide 30 mg daily, Eliquis, statin.  With suspicion for wild type ATTR amyloidosis he was placed on tafamidis.   Presented to the ED on 5/25 with complaints of severe left sided upper abdominal pain.    In the ED his labs showed sodium 136, potassium 3.7, creatinine 1.8, high-sensitivity troponin 117>> 100, lactic acid 1.2, WBC 9.2, hemoglobin 10.7.  Chest x-ray showed lucency under each hemidiaphragm compatible with intraperitoneal free gas.  Abdominal pelvis CT with contrast was recommended.  Underwent CT abdomen pelvis with moderate volume free intraperitoneal air and small volume ascites patible with perforated viscus.  He was seen by general surgery and underwent exploratory laparotomy with 4 mm perforated prepyloric ulcer, purulent peritonitis.  JP drain placed, continues with NG tube.   Cardiology asked to evaluate in regards to his history of cardiac amyloidosis and heart failure.   Past Medical History:  Diagnosis Date   Aortic stenosis    a.mild-mod by echo 06/2017.   Ascending aorta dilatation (HCC)    a. mildly dilated aortic root and mod dilated ascending aorta by echo 06/2017.   Chronic diastolic CHF (congestive heart failure) (HCC)    CKD (chronic kidney disease), stage III (HCC)    Complete heart block (HCC)    a. s/p STJ pacemaker in 02/2011 - Dr. [name]   Coronary artery disease    a. LAD atherectomy 1999.   Gilbert's syndrome    Hypertension    Insomnia, unspecified    Pacemaker-[employer] 07/16/2011   PAF (paroxysmal atrial fibrillation) ([place])    a.  identified on pacemaker interrogation; on Eliquis.   Peripheral neuropathy    Pure hypercholesterolemia    Restless leg syndrome    Spermatocele    near left testicle 2 cm   Spinal stenosis, lumbar    Thrombocytopenia ([place])    Averaging 140,000 since 2008   Tubular adenoma 09/2012   Dr. Watt Climes repeat 4 years   Type II or unspecified type diabetes mellitus without mention of  complication, not stated as uncontrolled    Unspecified sleep apnea     Past Surgical History:  Procedure Laterality Date   [place] Bilateral    LAPAROTOMY N/A 10/18/2021   Procedure: EXPLORATORY LAPAROTOMY, Omental Pedicle Flap for Perforated Ulcer;  Surgeon: Clovis Riley, MD;  Location: [facility];  Service: General;  Laterality: N/A;   LEAD INSERTION N/A 05/05/2019   Procedure: LEAD INSERTION;  Surgeon: Evans Lance, MD;  Location: [facility] [facility];  Service: Cardiovascular;  Laterality: N/A;   pacermaker  07/16/11   Dr. [name]   [facility] GENERATOR CHANGEOUT N/A 05/05/2019   Procedure: PPM GENERATOR CHANGEOUT;  Surgeon: Evans Lance, MD;  Location: [facility];  Service: Cardiovascular;  Laterality: N/A;   TEE WITHOUT CARDIOVERSION N/A 06/14/2014   Procedure: TRANSESOPHAGEAL ECHOCARDIOGRAM (TEE);  Surgeon: [name], MD;  Location: [facility] ENDOSCOPY;  Service: Cardiovascular;  Laterality: N/A;     Home Medications:  Prior to Admission medications   Medication Sig Start Date End Date Taking? Authorizing Provider  acetaminophen (TYLENOL) 500 MG tablet Take 1,000 mg by mouth every 4 (four) hours as needed for mild pain or headache.   Yes [provider]  apixaban (ELIQUIS) 2.5 MG TABS tablet Take 1 tablet (2.5 mg total) by mouth 2 (two) times daily. 04/03/20  Yes Larey Dresser, MD  ascorbic acid (VITAMIN C) 500 MG tablet Take 500 mg by mouth daily. Pt states taking 3/week   Yes [provider]  Cholecalciferol (VITAMIN D) 50 MCG (2000 UT) tablet Take 2,000 Units by mouth daily.   Yes [provider]  Cyanocobalamin (VITAMIN B-12) 2500 MCG SUBL Place 2,500 mcg under the tongue daily.    Yes [provider]  FARXIGA 10 MG TABS tablet TAKE 1 TABLET(10 MG) BY MOUTH DAILY BEFORE BREAKFAST Patient taking differently: Take 10 mg by mouth daily. 07/03/20   Yes Larey Dresser, MD  fluticasone [facility]) 50 MCG/ACT nasal spray Place 1 spray into both nostrils daily as needed for allergies or rhinitis.   Yes [provider]  loratadine (CLARITIN) 10 MG tablet Take 10 mg by mouth daily as needed for allergies.   Yes [provider]  Multiple Vitamins-Minerals (PRESERVISION AREDS 2) CAPS Take 1 capsule by mouth daily.   Yes [provider]  nitroGLYCERIN (NITROSTAT) 0.4 MG SL tablet Place 1 tablet (0.4 mg total) under the tongue every 5 (five) minutes as needed. For chest pain 01/09/18  Yes Jettie Booze, MD  NON FORMULARY BIPAP at bedtime   Yes [provider]  Polyvinyl Alcohol-Povidone (REFRESH OP) Place 1 drop into both eyes daily as needed (dry eyes).   Yes [provider]  potassium chloride (KLOR-CON M) 10 MEQ tablet Take 10 mEq by mouth 2 (two) times daily.   Yes [provider]  pramipexole (MIRAPEX) 1 MG tablet Take 2 mg by mouth at bedtime.   Yes  [provider]  simvastatin (ZOCOR) 10 MG tablet Take 10 mg by mouth every evening.   Yes [provider]  spironolactone (ALDACTONE) 25 MG tablet Take 0.5 tablets (12.5 mg total) by mouth at bedtime. Patient taking differently: Take 25 mg by mouth at bedtime. 07/28/20  Yes Larey Dresser, MD  torsemide (DEMADEX) 20 MG tablet Take 1.5 tablets (30 mg total) by mouth daily. Patient taking differently: Take 40 mg by mouth 2 (two) times daily. 9 am and 5 pm 06/15/20  Yes Larey Dresser, MD  triamcinolone cream (KENALOG) 0.1 % Apply 1 application topically 2 (two) times daily as needed (itching).   Yes [provider]  VYNDAMAX 61 MG CAPS TAKE 1 CAPSULE (61 MG) DAILY Patient taking differently: Take 61 mg by mouth daily. 05/29/21  Yes Larey Dresser, MD  Fluorouracil (TOLAK) 4 % CREA Apply 1 application topically at bedtime. Patient taking differently: Apply 1 application. topically at bedtime as needed (scalp  irritation). 06/01/20   Warren Danes, PA-C  sildenafil (VIAGRA) 50 MG tablet Take 25-50 mg by mouth daily as needed for erectile dysfunction. 06/05/21   [provider]    Inpatient Medications: Scheduled Meds:  heparin  5,000 Units Subcutaneous Q8H   [START ON 10/21/2021] pantoprazole  40 mg Intravenous Q12H   pramipexole  2 mg Oral QHS   Continuous Infusions:  acetaminophen 1,000 mg (10/18/21 2345)   dextrose 5 % and 0.45 % NaCl with KCl 20 mEq/L 100 mL/hr at 10/19/21 0107   methocarbamol (ROBAXIN) IV     pantoprazole 8 mg/hr (10/19/21 0108)   piperacillin-tazobactam (ZOSYN)  IV 3.375 g (10/19/21 0517)   PRN Meds: menthol-cetylpyridinium, methocarbamol (ROBAXIN) IV, metoprolol tartrate, morphine injection, ondansetron **OR** ondansetron (ZOFRAN) IV, phenol  Allergies:    Allergies  Allergen Reactions   Benadryl [Diphenhydramine Hcl] Other (See Comments)    Adverse reaction, has very strange behavior. Also hyperactive.   Lisinopril Other (See Comments)    hypotensive   Restoril [Temazepam] Other (See Comments)    Hyperactive    Social History:   Social History   Socioeconomic History   Marital status: Married    Spouse name: Not on file   Number of children: Not on file   Years of education: Not on file   Highest education level: Not on file  Occupational History   Not on file  Tobacco Use   Smoking status: Former   Smokeless tobacco: Never  Vaping Use   Vaping Use: Never used  Substance and Sexual Activity   Alcohol use: Yes    Comment: occasional glass of wine   Drug use: No   Sexual activity: Not on file  Other Topics Concern   Not on file  Social History Narrative   Not on file   Social Determinants of Health   Financial Resource Strain: Not on file  Food Insecurity: Not on file  Transportation Needs: Not on file  Physical Activity: Not on file  Stress: Not on file  Social Connections: Not on file  Intimate Partner Violence: Not on file     Family History:    Family History  Problem Relation Age of Onset   Cancer Mother        Small cell lung cancer   Diabetes Father        DM   CAD Father    Hypertension Father    Heart disease Father    Heart attack Father    Hypertension Sister  ROS:  Please see the history of present illness.   All other ROS reviewed and negative.     Physical Exam/Data:   Vitals:   10/18/21 2037 10/18/21 2315 10/19/21 0415 10/19/21 0804  BP: (!) 92/56 102/62 93/60 113/75  Pulse: 80 83 81 80  Resp: '20 20 19 17  '$ Temp: 98.4 F (36.9 C) 98.1 F (36.7 C) 97.6 F (36.4 C) 97.9 F (36.6 C)  TempSrc: Oral Oral Oral   SpO2: 100% 99% 96% 100%  Weight:      Height:        Intake/Output Summary (Last 24 hours) at 10/19/2021 1120 Last data filed at 10/19/2021 0532 Gross per 24 hour  Intake 513.69 ml  Output 2455 ml  Net -1941.31 ml      10/18/2021    5:30 AM 08/07/2020    3:12 PM 07/27/2020   12:03 PM  Last 3 Weights  Weight (lbs) 150 lb 157 lb 161 lb 6.4 oz  Weight (kg) 68.04 kg 71.215 kg 73.211 kg     Body mass index is 20.92 kg/m.  General:  Well nourished, well developed, in no acute distress HEENT: normal, NG tube in place Neck: no JVD Vascular: No carotid bruits; Distal pulses 2+ bilaterally Cardiac:  normal S1, S2; RRR; no murmur  Lungs:  mildly distended, JP drain  Abd: soft, nontender, no hepatomegaly  Ext: no edema Musculoskeletal:  No deformities, BUE and BLE strength normal and equal Skin: warm and dry  Neuro:  CNs 2-12 intact, no focal abnormalities noted Psych:  Normal affect   EKG:  The EKG was personally reviewed and demonstrates:  V paced   Relevant CV Studies:  Echo: 12/2019  IMPRESSIONS     1. Left ventricular ejection fraction, by estimation, is 35 to 40%. The  left ventricle has moderately decreased function. The left ventricle  demonstrates global hypokinesis. There is moderate concentric left  ventricular hypertrophy. Left ventricular   diastolic parameters are consistent with Grade II diastolic dysfunction  (pseudonormalization). Elevated left ventricular end-diastolic pressure.  The average left ventricular global longitudinal strain is -6.6 %.   2. Right ventricular systolic function is normal. The right ventricular  size is normal. There is normal pulmonary artery systolic pressure.   3. Left atrial size was moderately dilated.   4. Right atrial size was severely dilated.   5. The mitral valve is normal in structure. Mild mitral valve  regurgitation. No evidence of mitral stenosis.   6. The aortic valve is normal in structure. Aortic valve regurgitation is  not visualized. Mild aortic valve stenosis.   7. Aortic dilatation noted. Aneurysm of the ascending aorta, measuring 40  mm. There is mild dilatation at the level of the sinuses of Valsalva  measuring 37 mm.   8. The inferior vena cava is normal in size with greater than 50%  respiratory variability, suggesting right atrial pressure of 3 mmHg.   FINDINGS   Left Ventricle: Global hypokinesis worse in the septum. Global  longitudinal strain is markedly abnormal in somewhat of a bullesye patten,  concerning for cardiac amyloidosis. Left ventricular ejection fraction, by  estimation, is 35 to 40%. The left  ventricle has moderately decreased function. The left ventricle  demonstrates global hypokinesis. The average left ventricular global  longitudinal strain is -6.6 %. The left ventricular internal cavity size  was normal in size. There is moderate concentric   left ventricular hypertrophy. Left ventricular diastolic parameters are  consistent with Grade II diastolic dysfunction (pseudonormalization).  Elevated left ventricular end-diastolic pressure.   Right Ventricle: The right ventricular size is normal. No increase in  right ventricular wall thickness. Right ventricular systolic function is  normal. There is normal pulmonary artery systolic pressure. The  tricuspid  regurgitant velocity is 2.47 m/s, and   with an assumed right atrial pressure of 3 mmHg, the estimated right  ventricular systolic pressure is 76.7 mmHg.   Left Atrium: Left atrial size was moderately dilated.   Right Atrium: Right atrial size was severely dilated.   Pericardium: There is no evidence of pericardial effusion.   Mitral Valve: The mitral valve is normal in structure. Normal mobility of  the mitral valve leaflets. Mild mitral valve regurgitation. No evidence of  mitral valve stenosis.   Tricuspid Valve: The tricuspid valve is normal in structure. Tricuspid  valve regurgitation is mild . No evidence of tricuspid stenosis.   Aortic Valve: The aortic valve is normal in structure.. There is moderate  thickening and moderate calcification of the aortic valve. Aortic valve  regurgitation is not visualized. Mild aortic stenosis is present. There is  moderate thickening of the aortic   valve. There is moderate calcification of the aortic valve. Aortic valve  mean gradient measures 15.0 mmHg. Aortic valve peak gradient measures 26.3  mmHg. Aortic valve area, by VTI measures 0.79 cm.   Pulmonic Valve: The pulmonic valve was normal in structure. Pulmonic valve  regurgitation is not visualized. No evidence of pulmonic stenosis.   Aorta: Aortic dilatation noted. There is mild dilatation at the level of  the sinuses of Valsalva measuring 37 mm. There is an aneurysm involving  the ascending aorta. The aneurysm measures 40 mm.   Venous: The inferior vena cava is normal in size with greater than 50%  respiratory variability, suggesting right atrial pressure of 3 mmHg.   IAS/Shunts: No atrial level shunt detected by color flow Doppler.   Additional Comments: A pacer wire is visualized.   Laboratory Data:  High Sensitivity Troponin:   Recent Labs  Lab 10/18/21 0530 10/18/21 0841  TROPONINIHS 117* 100*     Chemistry Recent Labs  Lab 10/18/21 0530  10/19/21 0412  NA 136 135  K 3.7 4.3  CL 102 105  CO2 24 22  GLUCOSE 149* 170*  BUN 49* 32*  CREATININE 1.83* 1.71*  CALCIUM 8.9 8.3*  GFRNONAA 36* 39*  ANIONGAP 10 8    Recent Labs  Lab 10/18/21 0530 10/19/21 0412  PROT 6.3* 5.3*  ALBUMIN 3.7 3.0*  AST 30 22  ALT 21 14  ALKPHOS 84 52  BILITOT 1.9* 2.2*   Lipids No results for input(s): CHOL, TRIG, HDL, LABVLDL, LDLCALC, CHOLHDL in the last 168 hours.  Hematology Recent Labs  Lab 10/18/21 0530 10/19/21 0412  WBC 9.2 12.7*  RBC 3.33* 2.89*  HGB 10.7* 9.5*  HCT 32.6* 28.1*  MCV 97.9 97.2  MCH 32.1 32.9  MCHC 32.8 33.8  RDW 14.1 14.5  PLT 201 156   Thyroid No results for input(s): TSH, FREET4 in the last 168 hours.  BNPNo results for input(s): BNP, PROBNP in the last 168 hours.  DDimer No results for input(s): DDIMER in the last 168 hours.   Radiology/Studies:  CT ABDOMEN PELVIS WO CONTRAST  Result Date: 10/18/2021 CLINICAL DATA:  Abdominal pain EXAM: CT ABDOMEN AND PELVIS WITHOUT CONTRAST TECHNIQUE: Multidetector CT imaging of the abdomen and pelvis was performed following the standard protocol without IV contrast. RADIATION DOSE REDUCTION: This exam was performed according to the  departmental dose-optimization program which includes automated exposure control, adjustment of the mA and/or kV according to patient size and/or use of iterative reconstruction technique. COMPARISON:  CT abdomen and pelvis dated April 25, 2008 FINDINGS: Lower chest: Cardiomegaly. Bibasilar atelectasis. Coronary artery calcifications. Partially visualized pacer leads. Hepatobiliary: No focal liver abnormality is seen. Status post cholecystectomy. No biliary dilatation. Pancreas: Unremarkable. No pancreatic ductal dilatation or surrounding inflammatory changes. Spleen: Normal in size without focal abnormality. Adrenals/Urinary Tract: Thickening of the left adrenal gland with no definite nodularity. No hydronephrosis or nephrolithiasis.  Bilateral simple appearing renal cysts, no further follow-up imaging is recommended for these lesions. Bladder is unremarkable. Stomach/Bowel: Area of wall irregularity involving the body of the stomach on series 3, image 18, suspicious for gastric ulcer. Distal small bowel loops with adjacent inflammatory change are also seen, possibly reactive although ischemic small bowel could also have this appearance (best seen on coronal series 6, image 40). Evidence of obstruction appendix is not definitely visualized. Enteric contrast is seen in the stomach and proximal small bowel, no extraluminal contrast is seen. Vascular/Lymphatic: Aortic atherosclerosis. No enlarged abdominal or pelvic lymph nodes. Reproductive: Mild prostatomegaly. Other: Moderate volume free intraperitoneal air and small volume ascites. Musculoskeletal: Age indeterminate moderate compression deformity of L2. No aggressive appearing osseous lesions. IMPRESSION: 1. Moderate volume free intraperitoneal air and small volume ascites, findings are compatible with perforated viscus. Evaluation is somewhat limited due to lack of IV contrast. Within these limitations, an area of wall irregularity involving the body of the stomach is seen which is concerning for perforated gastric ulcer. 2. Distal small bowel loops with adjacent inflammatory change but no wall thickening are also seen, possibly reactive, although ischemic small bowel could also have this appearance. 3.  Aortic Atherosclerosis (ICD10-I70.0). Critical Value/emergent results were called by telephone at the time of interpretation on 10/18/2021 at 8:04 am to provider Tomi Bamberger , who verbally acknowledged these results. Electronically Signed   By: Yetta Glassman M.D.   On: 10/18/2021 08:14   DG Chest 2 View  Result Date: 10/18/2021 CLINICAL DATA:  Lower chest pain. EXAM: CHEST - 2 VIEW COMPARISON:  05/05/2019 FINDINGS: 0625 hours. Low volume film. The lungs are clear without focal pneumonia, edema,  pneumothorax or pleural effusion. The cardio pericardial silhouette is enlarged. Left permanent pacemaker again noted. The visualized bony structures of the thorax are unremarkable. Lucency under each hemidiaphragm is compatible with intraperitoneal free gas. IMPRESSION: 1. No acute cardiopulmonary findings. 2. Lucency under each hemidiaphragm compatible with intraperitoneal free gas. Abdomen pelvis CT with contrast material would likely prove helpful to further evaluate. Critical Value/emergent results were called by telephone at the time of interpretation on 10/18/2021 at 6:43 am to provider [facility] , who verbally acknowledged these results. Electronically Signed   By: Misty Stanley M.D.   On: 10/18/2021 06:45     Assessment and Plan:   David [name] is a 81 y.o. male with a hx of complete heart block s/p PPM, CKD stage III, paroxysmal atrial fibrillation, CAD, chronic systolic heart failure/HFrEF, cardiac amyloidosis, aortic stenosis who is being seen 10/19/2021 for the evaluation of medication assistance at the request of Dr. Kae Heller.  Perforated gastric ulcer: s/p ex lap. Currently with NG tube. Planned for upper GI post op day 3 -- per GenSurgery  HFrEF/NICM Cardiac amyloidosis Previously followed through the advanced heart failure clinic here, and reestablished care when he moved to [place]. --Has been maintained on tafamidis 61 mg daily, currently held in the setting of  acute illness.  --Would plan to resume once patient able to take POs, along with farxiga, spiro and torsemide  --Monitor volume status post op, IV lasix if needed --Appears his BB was stopped in the setting of hypotension by cardiology in [place]  CHB s/p PPM: V paced on EKG  CKD stage III: Cr 1.8>>1.7 -- follow BMET -- follows with nephrology as an outpatient back in [place]  Aortic Stenosis: mild on echo 12/2019  Paroxysmal Afib: Eliquis held on admission with need for surgery  -- resume per general surgery    HLD: resume PTA statin once taking POs  Patient seen and examined, note reviewed with the signed Advanced Practice Provider. I personally reviewed laboratory data, imaging studies and relevant notes. I independently examined the patient and formulated the important aspects of the plan. I have personally discussed the plan with the patient and/or family. Comments or changes to the note/plan are indicated below.  Resume meds on can tolerate oral intake.  Berniece Salines DO, MS [facility] Attending Cardiologist [place]  [address] [place], [place] [address] [phone] Website: [email or web address]   For questions or updates, please contact [place] Please consult [email or web address] for contact info under    Signed, Reino Bellis, NP  10/19/2021 11:20 AM

## 2021-10-19 NOTE — Progress Notes (Signed)
1 Day Post-Op   Subjective/Chief Complaint: Complains of irritation in his throat from the NG tube and having a hard time clearing mucus.  Denies significant abdominal pain.   Objective: Vital signs in last 24 hours: Temp:  [97.6 F (36.4 C)-98.4 F (36.9 C)] 97.9 F (36.6 C) (05/26 0804) Pulse Rate:  [79-83] 80 (05/26 0804) Resp:  [11-20] 17 (05/26 0804) BP: (85-113)/(50-75) 113/75 (05/26 0804) SpO2:  [96 %-100 %] 100 % (05/26 0804) Last BM Date :  (PTA)  Intake/Output from previous day: 05/25 0701 - 05/26 0700 In: 1863.7 [I.V.:648.3; IV Piggyback:1215.4] Out: 2960 [Urine:1825; Emesis/NG output:625; Drains:505; Blood:5] Intake/Output this shift: No intake/output data recorded.  Alert, well-appearing Unlabored respirations Abdomen is soft, mildly distended, nontender.  Incision is clean dry intact with mild evolving ecchymosis at the inferior aspect.  Drain output is thin serous.  Lab Results:  Recent Labs    10/18/21 0530 10/19/21 0412  WBC 9.2 12.7*  HGB 10.7* 9.5*  HCT 32.6* 28.1*  PLT 201 156   BMET Recent Labs    10/18/21 0530 10/19/21 0412  NA 136 135  K 3.7 4.3  CL 102 105  CO2 24 22  GLUCOSE 149* 170*  BUN 49* 32*  CREATININE 1.83* 1.71*  CALCIUM 8.9 8.3*   PT/INR No results for input(s): LABPROT, INR in the last 72 hours. ABG No results for input(s): PHART, HCO3 in the last 72 hours.  Invalid input(s): PCO2, PO2  Studies/Results: CT ABDOMEN PELVIS WO CONTRAST  Result Date: 10/18/2021 CLINICAL DATA:  Abdominal pain EXAM: CT ABDOMEN AND PELVIS WITHOUT CONTRAST TECHNIQUE: Multidetector CT imaging of the abdomen and pelvis was performed following the standard protocol without IV contrast. RADIATION DOSE REDUCTION: This exam was performed according to the departmental dose-optimization program which includes automated exposure control, adjustment of the mA and/or kV according to patient size and/or use of iterative reconstruction technique.  COMPARISON:  CT abdomen and pelvis dated April 25, 2008 FINDINGS: Lower chest: Cardiomegaly. Bibasilar atelectasis. Coronary artery calcifications. Partially visualized pacer leads. Hepatobiliary: No focal liver abnormality is seen. Status post cholecystectomy. No biliary dilatation. Pancreas: Unremarkable. No pancreatic ductal dilatation or surrounding inflammatory changes. Spleen: Normal in size without focal abnormality. Adrenals/Urinary Tract: Thickening of the left adrenal gland with no definite nodularity. No hydronephrosis or nephrolithiasis. Bilateral simple appearing renal cysts, no further follow-up imaging is recommended for these lesions. Bladder is unremarkable. Stomach/Bowel: Area of wall irregularity involving the body of the stomach on series 3, image 18, suspicious for gastric ulcer. Distal small bowel loops with adjacent inflammatory change are also seen, possibly reactive although ischemic small bowel could also have this appearance (best seen on coronal series 6, image 40). Evidence of obstruction appendix is not definitely visualized. Enteric contrast is seen in the stomach and proximal small bowel, no extraluminal contrast is seen. Vascular/Lymphatic: Aortic atherosclerosis. No enlarged abdominal or pelvic lymph nodes. Reproductive: Mild prostatomegaly. Other: Moderate volume free intraperitoneal air and small volume ascites. Musculoskeletal: Age indeterminate moderate compression deformity of L2. No aggressive appearing osseous lesions. IMPRESSION: 1. Moderate volume free intraperitoneal air and small volume ascites, findings are compatible with perforated viscus. Evaluation is somewhat limited due to lack of IV contrast. Within these limitations, an area of wall irregularity involving the body of the stomach is seen which is concerning for perforated gastric ulcer. 2. Distal small bowel loops with adjacent inflammatory change but no wall thickening are also seen, possibly reactive,  although ischemic small bowel could also have this appearance.  3.  Aortic Atherosclerosis (ICD10-I70.0). Critical Value/emergent results were called by telephone at the time of interpretation on 10/18/2021 at 8:04 am to provider Tomi Bamberger , who verbally acknowledged these results. Electronically Signed   By: Yetta Glassman M.D.   On: 10/18/2021 08:14   DG Chest 2 View  Result Date: 10/18/2021 CLINICAL DATA:  Lower chest pain. EXAM: CHEST - 2 VIEW COMPARISON:  05/05/2019 FINDINGS: 0625 hours. Low volume film. The lungs are clear without focal pneumonia, edema, pneumothorax or pleural effusion. The cardio pericardial silhouette is enlarged. Left permanent pacemaker again noted. The visualized bony structures of the thorax are unremarkable. Lucency under each hemidiaphragm is compatible with intraperitoneal free gas. IMPRESSION: 1. No acute cardiopulmonary findings. 2. Lucency under each hemidiaphragm compatible with intraperitoneal free gas. Abdomen pelvis CT with contrast material would likely prove helpful to further evaluate. Critical Value/emergent results were called by telephone at the time of interpretation on 10/18/2021 at 6:43 am to provider [facility] , who verbally acknowledged these results. Electronically Signed   By: Misty Stanley M.D.   On: 10/18/2021 06:45    Anti-infectives: Anti-infectives (From admission, onward)    Start     Dose/Rate Route Frequency Ordered Stop   10/18/21 1400  piperacillin-tazobactam (ZOSYN) IVPB 3.375 g        3.375 g 12.5 mL/hr over 240 Minutes Intravenous Every 8 hours 10/18/21 1328 10/23/21 1359   10/18/21 0700  piperacillin-tazobactam (ZOSYN) IVPB 3.375 g        3.375 g 100 mL/hr over 30 Minutes Intravenous  Once 10/18/21 0646 10/18/21 0828       Assessment/Plan: s/p Procedure(s): EXPLORATORY LAPAROTOMY, Omental Pedicle Flap for Perforated Ulcer (N/A) Continue NG tube, bowel rest, drain, broad-spectrum antibiotics Continue PPI gtt. Plan for upper  GI postop day 3 or after and if negative for leak can remove NG tube and slowly advance diet Once diet is fully advanced and tolerating well as long as JP drain output remains serous and low volume this can be removed, possibly before discharge depending on clinical course  Will consult cardiology given patient's history of cardiac amyloidosis/ CHF, he may be able to take some of his p.o. meds with small sips of water even before we remove his NG tube if necessary   LOS: 1 day    [place]is Riley 10/19/2021

## 2021-10-20 LAB — CBC
HCT: 28.4 % — ABNORMAL LOW (ref 39.0–52.0)
Hemoglobin: 9 g/dL — ABNORMAL LOW (ref 13.0–17.0)
MCH: 31.7 pg (ref 26.0–34.0)
MCHC: 31.7 g/dL (ref 30.0–36.0)
MCV: 100 fL (ref 80.0–100.0)
Platelets: 161 10*3/uL (ref 150–400)
RBC: 2.84 MIL/uL — ABNORMAL LOW (ref 4.22–5.81)
RDW: 14.6 % (ref 11.5–15.5)
WBC: 12.9 10*3/uL — ABNORMAL HIGH (ref 4.0–10.5)
nRBC: 0 % (ref 0.0–0.2)

## 2021-10-20 LAB — BASIC METABOLIC PANEL
Anion gap: 8 (ref 5–15)
BUN: 26 mg/dL — ABNORMAL HIGH (ref 8–23)
CO2: 22 mmol/L (ref 22–32)
Calcium: 8.5 mg/dL — ABNORMAL LOW (ref 8.9–10.3)
Chloride: 108 mmol/L (ref 98–111)
Creatinine, Ser: 1.45 mg/dL — ABNORMAL HIGH (ref 0.61–1.24)
GFR, Estimated: 48 mL/min — ABNORMAL LOW (ref >60)
Glucose, Bld: 143 mg/dL — ABNORMAL HIGH (ref 70–99)
Potassium: 3.7 mmol/L (ref 3.5–5.1)
Sodium: 138 mmol/L (ref 135–145)

## 2021-10-20 LAB — MAGNESIUM: Magnesium: 2.6 mg/dL — ABNORMAL HIGH (ref 1.7–2.4)

## 2021-10-20 LAB — GLUCOSE, CAPILLARY
Glucose-Capillary: 142 mg/dL — ABNORMAL HIGH (ref 70–99)
Glucose-Capillary: 177 mg/dL — ABNORMAL HIGH (ref 70–99)
Glucose-Capillary: 168 mg/dL — ABNORMAL HIGH (ref 70–99)
Glucose-Capillary: 172 mg/dL — ABNORMAL HIGH (ref 70–99)

## 2021-10-20 MED ORDER — ORAL CARE MOUTH RINSE
15.0000 mL | Freq: Two times a day (BID) | OROMUCOSAL | Status: DC
  Administered 2021-10-20 – 2021-10-24 (×10): 15 mL via OROMUCOSAL

## 2021-10-20 MED ORDER — CHLORHEXIDINE GLUCONATE 0.12 % MT SOLN
15.0000 mL | Freq: Two times a day (BID) | OROMUCOSAL | Status: DC
  Administered 2021-10-20 – 2021-10-24 (×8): 15 mL via OROMUCOSAL
  Filled 2021-10-20 (×6): qty 15

## 2021-10-20 NOTE — Progress Notes (Signed)
Mobility Specialist Progress Note:   10/20/21 1400  Mobility  Activity Transferred from chair to bed  Level of Assistance Standby assist, set-up cues, supervision of patient - no hands on  Assistive Device None  Distance Ambulated (ft) 3 ft  Activity Response Tolerated well  $Mobility charge 1 Mobility   Pt requesting to get back to bed from chair. Required minG throughout transfer. Pt back in bed with bed alarm on, all needs met.   David [name] Acute Rehab Secure Chat or Office Phone: [phone]  

## 2021-10-20 NOTE — Progress Notes (Signed)
Mobility Specialist Progress Note:   10/20/21 1020  Mobility  Activity Ambulated with assistance in hallway  Level of Assistance Minimal assist, patient does 75% or more  Assistive Device None  Distance Ambulated (ft) 275 ft  Activity Response Tolerated well  $Mobility charge 1 Mobility   Pt eager for mobility session. Required no assistance with bed mobility+transfers. Pt requesting to ambulate without AD, requiring minA at times d/t minor LOBs. Distance limited d/t fatigue. Pt left in chair with all needs met, chair alarm on.   Nelta Numbers Acute Rehab Secure Chat or Office Phone: [phone]

## 2021-10-20 NOTE — Progress Notes (Signed)
2 Days Post-Op   Subjective/Chief Complaint: Having flatus, no complaints   Objective: Vital signs in last 24 hours: Temp:  [97.4 F (36.3 C)-98.4 F (36.9 C)] 98.4 F (36.9 C) (05/27 0835) Pulse Rate:  [63-81] 63 (05/27 0835) Resp:  [16-17] 16 (05/27 0835) BP: (88-97)/(56-66) 92/57 (05/27 0835) SpO2:  [94 %-100 %] 100 % (05/27 0835) Last BM Date :  (PTA)  Intake/Output from previous day: 05/26 0701 - 05/27 0700 In: 3667.4 [I.V.:3528.9; IV Piggyback:138.6] Out: 1475 [Urine:950; Emesis/NG output:250; Drains:275] Intake/Output this shift: Total I/O In: -  Out: 350 [Urine:350]  Abdomen soft approp tender drain serous wound clean  Lab Results:  Recent Labs    10/19/21 0412 10/20/21 0118  WBC 12.7* 12.9*  HGB 9.5* 9.0*  HCT 28.1* 28.4*  PLT 156 161   BMET Recent Labs    10/19/21 0412 10/20/21 0118  NA 135 138  K 4.3 3.7  CL 105 108  CO2 22 22  GLUCOSE 170* 143*  BUN 32* 26*  CREATININE 1.71* 1.45*  CALCIUM 8.3* 8.5*   PT/INR No results for input(s): LABPROT, INR in the last 72 hours. ABG No results for input(s): PHART, HCO3 in the last 72 hours.  Invalid input(s): PCO2, PO2  Studies/Results: No results found.  Anti-infectives: Anti-infectives (From admission, onward)    Start     Dose/Rate Route Frequency Ordered Stop   10/18/21 1400  piperacillin-tazobactam (ZOSYN) IVPB 3.375 g        3.375 g 12.5 mL/hr over 240 Minutes Intravenous Every 8 hours 10/18/21 1328 10/23/21 1359   10/18/21 0700  piperacillin-tazobactam (ZOSYN) IVPB 3.375 g        3.375 g 100 mL/hr over 30 Minutes Intravenous  Once 10/18/21 0646 10/18/21 0828       Assessment/Plan: POD 2 graham patch for perforated ulcer- Kae Heller -continue ng tube, will get ugi tomorrow as planned -tx h pylori -appreciate cards seeing him -sq heparin, scds -will change to just bid protonix now  Rolm Bookbinder 10/20/2021

## 2021-10-21 ENCOUNTER — Inpatient Hospital Stay (HOSPITAL_COMMUNITY): Payer: Medicare HMO

## 2021-10-21 DIAGNOSIS — K255 Chronic or unspecified gastric ulcer with perforation: Secondary | ICD-10-CM

## 2021-10-21 MED ORDER — TORSEMIDE 20 MG PO TABS
30.0000 mg | ORAL_TABLET | Freq: Every day | ORAL | Status: DC
  Administered 2021-10-21 – 2021-10-24 (×4): 30 mg via ORAL
  Filled 2021-10-21 (×4): qty 2

## 2021-10-21 MED ORDER — SPIRONOLACTONE 12.5 MG HALF TABLET
12.5000 mg | ORAL_TABLET | Freq: Every day | ORAL | Status: DC
  Administered 2021-10-21 – 2021-10-24 (×4): 12.5 mg via ORAL
  Filled 2021-10-21 (×5): qty 1

## 2021-10-21 MED ORDER — IOHEXOL 300 MG/ML  SOLN
200.0000 mL | Freq: Once | INTRAMUSCULAR | Status: AC | PRN
  Administered 2021-10-21: 200 mL

## 2021-10-21 NOTE — Progress Notes (Signed)
Brief cardiology progress note:  Patient pending upper GI exam today. Based on results, if he is cleared to resume oral medications, would restart his home cardiac medications. Would restart his apixaban when cleared from surgical standpoint.  Home cardiac meds: Spironolactone 12.5 mg daily Dapagliflozin 10 mg daily Torsemide 30 mg daily Apixaban 2.5 mg BID (reduced due to age, baseline Cr typically >1.5) Simvastatin 10 mg daily. Tafamadis 61 mg daily  I will request outpatient follow up for him with Dr. Irish Lack or a member of his team if he plans to remain in [place] for his recovery. He has been followed most recently in [place].  Please contact us for any questions or concerns.  Buford Dresser, MD, PhD, [facility] at [facility] [place] [facility] [address] [place], [place] [address] [phone]

## 2021-10-21 NOTE — Progress Notes (Signed)
Mobility Specialist Progress Note:   10/21/21 1500  Mobility  Activity Ambulated with assistance in hallway  Level of Assistance Minimal assist, patient does 75% or more  Assistive Device None  Distance Ambulated (ft) 550 ft  Activity Response Tolerated well  $Mobility charge 1 Mobility   Pt eager for mobility session. Required minA throughout for unsteadiness. Pt left in chair with all needs met, wife in room.  Nelta Numbers Acute Rehab Secure Chat or Office Phone: [phone]

## 2021-10-21 NOTE — Progress Notes (Signed)
3 Days Post-Op   Subjective/Chief Complaint: No complaints, having flatus   Objective: Vital signs in last 24 hours: Temp:  [97.5 F (36.4 C)-98.2 F (36.8 C)] 97.8 F (36.6 C) (05/28 0746) Pulse Rate:  [80-83] 80 (05/28 0746) Resp:  [17-20] 17 (05/28 0746) BP: (98-107)/(60-66) 98/60 (05/28 0746) SpO2:  [97 %-100 %] 97 % (05/28 0746) Last BM Date :  (PTA)  Intake/Output from previous day: 05/27 0701 - 05/28 0700 In: 2617 [I.V.:2417; IV Piggyback:200] Out: 2015 [Urine:1525; Emesis/NG output:390; Drains:100] Intake/Output this shift: No intake/output data recorded.  Abdomen soft approp tender drain serous wound clean, ng nonbilious  Lab Results:  Recent Labs    10/19/21 0412 10/20/21 0118  WBC 12.7* 12.9*  HGB 9.5* 9.0*  HCT 28.1* 28.4*  PLT 156 161   BMET Recent Labs    10/19/21 0412 10/20/21 0118  NA 135 138  K 4.3 3.7  CL 105 108  CO2 22 22  GLUCOSE 170* 143*  BUN 32* 26*  CREATININE 1.71* 1.45*  CALCIUM 8.3* 8.5*   PT/INR No results for input(s): LABPROT, INR in the last 72 hours. ABG No results for input(s): PHART, HCO3 in the last 72 hours.  Invalid input(s): PCO2, PO2  Studies/Results: No results found.  Anti-infectives: Anti-infectives (From admission, onward)    Start     Dose/Rate Route Frequency Ordered Stop   10/18/21 1400  piperacillin-tazobactam (ZOSYN) IVPB 3.375 g        3.375 g 12.5 mL/hr over 240 Minutes Intravenous Every 8 hours 10/18/21 1328 10/23/21 1359   10/18/21 0700  piperacillin-tazobactam (ZOSYN) IVPB 3.375 g        3.375 g 100 mL/hr over 30 Minutes Intravenous  Once 10/18/21 0646 10/18/21 0828       Assessment/Plan: POD 3 graham patch for perforated ulcer- David [name] -continue ng tube, will get ugi today, if negative will remove ng, clears and treat h pylori -appreciate cards seeing him, restart home meds once able to do po -sq heparin, scds -bid protonix -recheck labs in am     David [name] 10/21/2021

## 2021-10-21 NOTE — Progress Notes (Signed)
Mobility Specialist Progress Note:   10/21/21 1030  Mobility  Activity  (bed level exercises)  Range of Motion/Exercises Active;All extremities  Level of Assistance Independent  Assistive Device None  Activity Response Tolerated well  $Mobility charge 1 Mobility   Pt eager for mobility session, however politely declining OOB mobility until NG tube gets pulled later this am. Performed bed level exercises without issue. Pt left with all needs met, will f/u for ambulation as time permits.   Nelta Numbers Acute Rehab Secure Chat or Office Phone: [phone]

## 2021-10-22 LAB — BASIC METABOLIC PANEL
Anion gap: 8 (ref 5–15)
Anion gap: 5 (ref 5–15)
BUN: 17 mg/dL (ref 8–23)
BUN: 16 mg/dL (ref 8–23)
CO2: 21 mmol/L — ABNORMAL LOW (ref 22–32)
CO2: 23 mmol/L (ref 22–32)
Calcium: 8.1 mg/dL — ABNORMAL LOW (ref 8.9–10.3)
Calcium: 8 mg/dL — ABNORMAL LOW (ref 8.9–10.3)
Chloride: 108 mmol/L (ref 98–111)
Chloride: 109 mmol/L (ref 98–111)
Creatinine, Ser: 1.5 mg/dL — ABNORMAL HIGH (ref 0.61–1.24)
Creatinine, Ser: 1.37 mg/dL — ABNORMAL HIGH (ref 0.61–1.24)
GFR, Estimated: 46 mL/min — ABNORMAL LOW (ref >60)
GFR, Estimated: 51 mL/min — ABNORMAL LOW (ref >60)
Glucose, Bld: 120 mg/dL — ABNORMAL HIGH (ref 70–99)
Glucose, Bld: 139 mg/dL — ABNORMAL HIGH (ref 70–99)
Potassium: 3.6 mmol/L (ref 3.5–5.1)
Potassium: 3.1 mmol/L — ABNORMAL LOW (ref 3.5–5.1)
Sodium: 137 mmol/L (ref 135–145)
Sodium: 137 mmol/L (ref 135–145)

## 2021-10-22 LAB — CBC
HCT: 27.2 % — ABNORMAL LOW (ref 39.0–52.0)
Hemoglobin: 8.8 g/dL — ABNORMAL LOW (ref 13.0–17.0)
MCH: 32.1 pg (ref 26.0–34.0)
MCHC: 32.4 g/dL (ref 30.0–36.0)
MCV: 99.3 fL (ref 80.0–100.0)
Platelets: 167 10*3/uL (ref 150–400)
RBC: 2.74 MIL/uL — ABNORMAL LOW (ref 4.22–5.81)
RDW: 14.4 % (ref 11.5–15.5)
WBC: 8 10*3/uL (ref 4.0–10.5)
nRBC: 0 % (ref 0.0–0.2)

## 2021-10-22 MED ORDER — APIXABAN 2.5 MG PO TABS
2.5000 mg | ORAL_TABLET | Freq: Two times a day (BID) | ORAL | Status: DC
  Administered 2021-10-22 – 2021-10-24 (×5): 2.5 mg via ORAL
  Filled 2021-10-22 (×5): qty 1

## 2021-10-22 MED ORDER — METRONIDAZOLE 500 MG PO TABS
500.0000 mg | ORAL_TABLET | Freq: Three times a day (TID) | ORAL | Status: DC
  Administered 2021-10-22 – 2021-10-23 (×3): 500 mg via ORAL
  Filled 2021-10-22 (×5): qty 1

## 2021-10-22 MED ORDER — METHOCARBAMOL 500 MG PO TABS: 500.0000 mg | ORAL_TABLET | Freq: Three times a day (TID) | ORAL | Status: DC | PRN

## 2021-10-22 MED ORDER — CLARITHROMYCIN 500 MG PO TABS
500.0000 mg | ORAL_TABLET | Freq: Two times a day (BID) | ORAL | Status: DC
  Administered 2021-10-22 (×2): 500 mg via ORAL
  Filled 2021-10-22 (×3): qty 1

## 2021-10-22 NOTE — Progress Notes (Signed)
Mobility Specialist Progress Note:   10/22/21 1535  Mobility  Activity Ambulated with assistance in hallway  Level of Assistance Minimal assist, patient does 75% or more  Assistive Device None  Distance Ambulated (ft) 550 ft  Activity Response Tolerated well  $Mobility charge 1 Mobility   Pt eager for second mobility session this afternoon. Requesting to ambulate with no AD. Pt still with mild unsteadiness, requiring minA to correct. Pt left in bed with all needs met, wife present.   Nelta Numbers Acute Rehab Secure Chat or Office Phone: [phone]

## 2021-10-22 NOTE — Progress Notes (Addendum)
4 Days Post-Op   Subjective/Chief Complaint: Having loose stools. Tol diet, ugi negative   Objective: Vital signs in last 24 hours: Temp:  [97.5 F (36.4 C)-98.9 F (37.2 C)] 97.5 F (36.4 C) (05/29 0341) Pulse Rate:  [80-83] 81 (05/29 0341) Resp:  [16-20] 20 (05/29 0341) BP: (92-109)/(59-71) 104/63 (05/29 0341) SpO2:  [97 %-99 %] 97 % (05/29 0341) Last BM Date :  (PTA)  Intake/Output from previous day: 05/28 0701 - 05/29 0700 In: 712.1 [I.V.:662.1; IV Piggyback:50] Out: 925 [Urine:600; Drains:325] Intake/Output this shift: No intake/output data recorded.  GI: soft approp tender drain serous, incisoin clean  Lab Results:  Recent Labs    10/20/21 0118 10/22/21 0112  WBC 12.9* 8.0  HGB 9.0* 8.8*  HCT 28.4* 27.2*  PLT 161 167   BMET Recent Labs    10/20/21 0118 10/22/21 0112  NA 138 137  K 3.7 3.6  CL 108 108  CO2 22 21*  GLUCOSE 143* 120*  BUN 26* 17  CREATININE 1.45* 1.50*  CALCIUM 8.5* 8.1*   PT/INR No results for input(s): LABPROT, INR in the last 72 hours. ABG No results for input(s): PHART, HCO3 in the last 72 hours.  Invalid input(s): PCO2, PO2  Studies/Results: DG UGI W SINGLE CM (SOL OR THIN BA)  Result Date: 10/21/2021 CLINICAL DATA:  3 days status post Phillip Heal patch repair of perforated pre-pyloric gastric ulcer. Evaluate for postop leak. EXAM: WATER SOLUBLE UPPER GI SERIES TECHNIQUE: Single-column upper GI series was performed using Omnipaque 300 water soluble contrast. Radiation Exposure Index (as provided by the fluoroscopic device): 31.4 mGy Kerma CONTRAST:  221m OMNIPAQUE IOHEXOL 300 MG/ML  SOLN COMPARISON:  None Available. FINDINGS: Water-soluble contrast was administered via the patient's existing nasogastric tube. The stomach is unremarkable appearance for the degree of distention. Prompt gastric emptying of contrast is seen. The duodenum has a normal appearance and course, with ligament of Treitz in the left upper quadrant. There is no  evidence of contrast leak or extravasation from the stomach or duodenum. IMPRESSION: Negative exam. No evidence of contrast leak from the stomach or duodenum. No evidence of gastroduodenal obstruction. Electronically Signed   By: JMarlaine HindM.D.   On: 10/21/2021 10:21    Anti-infectives: Anti-infectives (From admission, onward)    Start     Dose/Rate Route Frequency Ordered Stop   10/18/21 1400  piperacillin-tazobactam (ZOSYN) IVPB 3.375 g        3.375 g 12.5 mL/hr over 240 Minutes Intravenous Every 8 hours 10/18/21 1328 10/23/21 1359   10/18/21 0700  piperacillin-tazobactam (ZOSYN) IVPB 3.375 g        3.375 g 100 mL/hr over 30 Minutes Intravenous  Once 10/18/21 0646 10/18/21 0828       Assessment/Plan: POD 4 graham patch for perforated ulcer- CKae Heller-ugi fine, will give soft diet today -continue drain until tolerating po, likely can remove prior to dc -appreciate cards following,  restarted home meds, there is no tafamadis here, will restart apixaban, diuretics restarted, cr up a little today, will recheck in am, follow I/o, daily weight -apixaban, scds -bid protonix -start h pylori treatment today -zosyn d4/5 -dispo pending- [place] could benefit [place] f/u also  MRolm Bookbinder5/29/2023

## 2021-10-22 NOTE — Care Management Important Message (Signed)
Important Message  Patient Details  Name: David [name] MRN: [id number] Date of Birth: Mar 28, 1941   Medicare Important Message Given:  Yes     Orbie Pyo 10/22/2021, 2:38 PM

## 2021-10-22 NOTE — Plan of Care (Signed)

## 2021-10-22 NOTE — Progress Notes (Signed)
Mobility Specialist Progress Note:   10/22/21 1000  Mobility  Activity  (bed level exercises)  Range of Motion/Exercises Active;All extremities  Level of Assistance Independent  Assistive Device None  Activity Response Tolerated well  $Mobility charge 1 Mobility   Pt states he had a "rough night". Agreeable to bed level exercises, deferring OOB mobility until this afternoon. Pt tolerated exercises well. Left with all needs met.   Nelta Numbers Acute Rehab Secure Chat or Office Phone: [phone]

## 2021-10-22 NOTE — Progress Notes (Signed)
Mobility Specialist Progress Note:   10/22/21 1130  Mobility  Activity Ambulated with assistance in hallway  Level of Assistance Standby assist, set-up cues, supervision of patient - no hands on  Assistive Device Front wheel walker  Distance Ambulated (ft) 550 ft  Activity Response Tolerated well  $Mobility charge 1 Mobility   Pt eager for mobility session. Ambulated with minG assist with RW. Pt displays increased steadiness with RW. Asx throughout ambulation. Left in chair with all needs met.   Nelta Numbers Acute Rehab Secure Chat or Office Phone: [phone]

## 2021-10-23 LAB — CULTURE, BLOOD (ROUTINE X 2)
Culture: NO GROWTH
Culture: NO GROWTH
Special Requests: ADEQUATE

## 2021-10-23 LAB — BASIC METABOLIC PANEL
Anion gap: 8 (ref 5–15)
BUN: 14 mg/dL (ref 8–23)
CO2: 22 mmol/L (ref 22–32)
Calcium: 8.2 mg/dL — ABNORMAL LOW (ref 8.9–10.3)
Chloride: 109 mmol/L (ref 98–111)
Creatinine, Ser: 1.52 mg/dL — ABNORMAL HIGH (ref 0.61–1.24)
GFR, Estimated: 45 mL/min — ABNORMAL LOW (ref >60)
Glucose, Bld: 126 mg/dL — ABNORMAL HIGH (ref 70–99)
Potassium: 3.1 mmol/L — ABNORMAL LOW (ref 3.5–5.1)
Sodium: 139 mmol/L (ref 135–145)

## 2021-10-23 MED ORDER — BISMUTH SUBSALICYLATE 262 MG/15ML PO SUSP
30.0000 mL | Freq: Three times a day (TID) | ORAL | Status: DC
Start: 2021-10-23 — End: 2021-10-23
  Filled 2021-10-23: qty 236

## 2021-10-23 MED ORDER — DOXYCYCLINE HYCLATE 100 MG PO TABS: 100.0000 mg | ORAL_TABLET | Freq: Two times a day (BID) | ORAL | Status: DC

## 2021-10-23 MED ORDER — DOXYCYCLINE HYCLATE 100 MG PO TABS
100.0000 mg | ORAL_TABLET | Freq: Two times a day (BID) | ORAL | Status: DC
  Administered 2021-10-23 – 2021-10-24 (×3): 100 mg via ORAL
  Filled 2021-10-23 (×3): qty 1

## 2021-10-23 MED ORDER — ACETAMINOPHEN 325 MG PO TABS: 650.0000 mg | ORAL_TABLET | Freq: Four times a day (QID) | ORAL | Status: DC | PRN

## 2021-10-23 MED ORDER — TRAMADOL HCL 50 MG PO TABS
50.0000 mg | ORAL_TABLET | Freq: Four times a day (QID) | ORAL | Status: DC | PRN
  Administered 2021-10-23 (×2): 50 mg via ORAL
  Filled 2021-10-23 (×2): qty 1

## 2021-10-23 MED ORDER — BISMUTH SUBSALICYLATE 262 MG/15ML PO SUSP
30.0000 mL | Freq: Three times a day (TID) | ORAL | Status: DC
  Administered 2021-10-23 – 2021-10-24 (×5): 30 mL via ORAL
  Filled 2021-10-23: qty 236

## 2021-10-23 MED ORDER — METRONIDAZOLE 500 MG PO TABS
500.0000 mg | ORAL_TABLET | Freq: Three times a day (TID) | ORAL | Status: DC
  Administered 2021-10-23 – 2021-10-24 (×4): 500 mg via ORAL
  Filled 2021-10-23 (×4): qty 1

## 2021-10-23 MED ORDER — CALCIUM POLYCARBOPHIL 625 MG PO TABS
625.0000 mg | ORAL_TABLET | Freq: Every day | ORAL | Status: DC
  Administered 2021-10-23 – 2021-10-24 (×2): 625 mg via ORAL
  Filled 2021-10-23 (×3): qty 1

## 2021-10-23 MED ORDER — PANTOPRAZOLE SODIUM 40 MG PO TBEC
40.0000 mg | DELAYED_RELEASE_TABLET | Freq: Two times a day (BID) | ORAL | Status: DC
  Administered 2021-10-23 – 2021-10-24 (×3): 40 mg via ORAL
  Filled 2021-10-23 (×3): qty 1

## 2021-10-23 MED ORDER — POTASSIUM CHLORIDE CRYS ER 20 MEQ PO TBCR
40.0000 meq | EXTENDED_RELEASE_TABLET | Freq: Three times a day (TID) | ORAL | Status: AC
  Administered 2021-10-23 (×3): 40 meq via ORAL
  Filled 2021-10-23 (×3): qty 2

## 2021-10-23 MED ORDER — MORPHINE SULFATE (PF) 2 MG/ML IV SOLN: 2.0000 mg | INTRAVENOUS | Status: DC | PRN

## 2021-10-23 NOTE — Progress Notes (Signed)
Mobility Specialist Progress Note:   10/23/21 1600  Mobility  Activity Ambulated with assistance in hallway  Level of Assistance Standby assist, set-up cues, supervision of patient - no hands on  Assistive Device Front wheel walker  Distance Ambulated (ft) 550 ft  Activity Response Tolerated well  $Mobility charge 1 Mobility   Pt eager for mobility session. C/o feeling "weak" today after taking multiple naps. Able to ambulate at supervision level. Left in bed with all needs met.   Nelta Numbers Acute Rehab Secure Chat or Office Phone: [phone]

## 2021-10-23 NOTE — Discharge Instructions (Signed)
I do recommend following up with PCP within about 1 week of being home. They should be able to request records from hospitalization here, you may just have to sign a release form. Would recommend GI follow up for upper endoscopy in 6-8 weeks to make sure you are healing appropriately - if you can't get in before 8 weeks that is ok but would not recommend waiting more than 6 months.   [facility]      [facility], [place] [phone]  OPEN ABDOMINAL SURGERY: POST OP INSTRUCTIONS  Always review your discharge instruction sheet given to you by the facility where your surgery was performed.  IF YOU HAVE DISABILITY OR FAMILY LEAVE FORMS, YOU MUST BRING THEM TO THE OFFICE FOR PROCESSING.  PLEASE DO NOT GIVE THEM TO YOUR DOCTOR.  A prescription for pain medication may be given to you upon discharge.  Take your pain medication as prescribed, if needed.  If narcotic pain medicine is not needed, then you may take acetaminophen (Tylenol) or ibuprofen (Advil) as needed. Take your usually prescribed medications unless otherwise directed. If you need a refill on your pain medication, please contact your pharmacy. They will contact our office to request authorization.  Prescriptions will not be filled after 5pm or on week-ends. You should follow a light diet the first few days after arrival home, such as soup and crackers, pudding, etc.unless your doctor has advised otherwise. A high-fiber, low fat diet can be resumed as tolerated.   Be sure to include lots of fluids daily. Most patients will experience some swelling and bruising on the chest and neck area.  Ice packs will help.  Swelling and bruising can take several days to resolve Most patients will experience some swelling and bruising in the area of the incision. Ice pack will help. Swelling and bruising can take several days to resolve..  It is common to experience some constipation if taking pain medication after surgery.  Increasing fluid intake and taking  a stool softener will usually help or prevent this problem from occurring.  A mild laxative (Milk of Magnesia or Miralax) should be taken according to package directions if there are no bowel movements after 48 hours.  You may have steri-strips (small skin tapes) in place directly over the incision.  These strips should be left on the skin for 7-10 days.  If your surgeon used skin glue on the incision, you may shower in 24 hours.  The glue will flake off over the next 2-3 weeks.  Any sutures or staples will be removed at the office during your follow-up visit. You may find that a light gauze bandage over your incision may keep your staples from being rubbed or pulled. You may shower and replace the bandage daily. ACTIVITIES:  You may resume regular (light) daily activities beginning the next day--such as daily self-care, walking, climbing stairs--gradually increasing activities as tolerated.  You may have sexual intercourse when it is comfortable.  Refrain from any heavy lifting or straining until approved by your doctor. You may drive when you no longer are taking prescription pain medication, you can comfortably wear a seatbelt, and you can safely maneuver your car and apply brakes  You should see your doctor in the office for a follow-up appointment approximately two weeks after your surgery.  Make sure that you call for this appointment within a day or two after you arrive home to insure a convenient appointment time.   WHEN TO CALL YOUR DOCTOR: Fever over 101.0  Inability to urinate Nausea and/or vomiting Extreme swelling or bruising Continued bleeding from incision. Increased pain, redness, or drainage from the incision. Difficulty swallowing or breathing Muscle cramping or spasms. Numbness or tingling in hands or feet or around lips.  The clinic staff is available to answer your questions during regular business hours.  Please don't hesitate to call and ask to speak to one of the nurses if  you have concerns.  For further questions, please visit [email or web address]

## 2021-10-23 NOTE — Progress Notes (Signed)
Progress Note  5 Days Post-Op  Subjective: Pt tolerated FLD yesterday but was having some diarrhea and incontinence of stool. Had a soft diet this AM and seems to be tolerating so far. Pain minimal. Planning to say with son and daughter in law for a few weeks prior to returning to [place].   Objective: Vital signs in last 24 hours: Temp:  [97.4 F (36.3 C)-98.5 F (36.9 C)] 97.6 F (36.4 C) (05/30 0753) Pulse Rate:  [80-83] 80 (05/30 0753) Resp:  [17-18] 18 (05/30 0753) BP: (95-110)/(55-66) 104/55 (05/30 0753) SpO2:  [94 %-100 %] 100 % (05/30 0753) Last BM Date : 10/22/21  Intake/Output from previous day: 05/29 0701 - 05/30 0700 In: 290 [P.O.:240; IV Piggyback:50] Out: 1270 [Urine:400; Drains:510; Blood:360] Intake/Output this shift: Total I/O In: -  Out: 65 [Drains:65]  PE: General: pleasant, WD, elderly male who is laying in bed in NAD Heart: regular, rate, and rhythm.   Lungs: Respiratory effort nonlabored Abd: soft, NT, ND, +BS, drain in RLQ with serous fluid, upper midline incision C/D/I with 7 staples present Psych: A&Ox3 with an appropriate affect.    Lab Results:  Recent Labs    10/22/21 0112  WBC 8.0  HGB 8.8*  HCT 27.2*  PLT 167   BMET Recent Labs    10/22/21 0937 10/23/21 0824  NA 137 139  K 3.1* 3.1*  CL 109 109  CO2 23 22  GLUCOSE 139* 126*  BUN 16 14  CREATININE 1.37* 1.52*  CALCIUM 8.0* 8.2*   PT/INR No results for input(s): LABPROT, INR in the last 72 hours. CMP     Component Value Date/Time   NA 139 10/23/2021 0824   NA 143 04/26/2019 1545   K 3.1 (L) 10/23/2021 0824   CL 109 10/23/2021 0824   CO2 22 10/23/2021 0824   GLUCOSE 126 (H) 10/23/2021 0824   BUN 14 10/23/2021 0824   BUN 37 (H) 04/26/2019 1545   CREATININE 1.52 (H) 10/23/2021 0824   CREATININE 1.30 (H) 01/31/2016 1545   CALCIUM 8.2 (L) 10/23/2021 0824   CALCIUM 9.3 12/14/2019 1305   PROT 5.3 (L) 10/19/2021 0412   PROT 6.7 05/10/2019 1313   ALBUMIN 3.0 (L) 10/19/2021  0412   AST 22 10/19/2021 0412   ALT 14 10/19/2021 0412   ALKPHOS 52 10/19/2021 0412   BILITOT 2.2 (H) 10/19/2021 0412   GFRNONAA 45 (L) 10/23/2021 0824   GFRAA 47 (L) 01/05/2020 1135   Lipase     Component Value Date/Time   LIPASE 39 10/18/2021 0530       Studies/Results: DG UGI W SINGLE CM (SOL OR THIN BA)  Result Date: 10/21/2021 CLINICAL DATA:  3 days status post Phillip Heal patch repair of perforated pre-pyloric gastric ulcer. Evaluate for postop leak. EXAM: WATER SOLUBLE UPPER GI SERIES TECHNIQUE: Single-column upper GI series was performed using Omnipaque 300 water soluble contrast. Radiation Exposure Index (as provided by the fluoroscopic device): 31.4 mGy Kerma CONTRAST:  227m OMNIPAQUE IOHEXOL 300 MG/ML  SOLN COMPARISON:  None Available. FINDINGS: Water-soluble contrast was administered via the patient's existing nasogastric tube. The stomach is unremarkable appearance for the degree of distention. Prompt gastric emptying of contrast is seen. The duodenum has a normal appearance and course, with ligament of Treitz in the left upper quadrant. There is no evidence of contrast leak or extravasation from the stomach or duodenum. IMPRESSION: Negative exam. No evidence of contrast leak from the stomach or duodenum. No evidence of gastroduodenal obstruction. Electronically Signed  By: Marlaine Hind M.D.   On: 10/21/2021 10:21    Anti-infectives: Anti-infectives (From admission, onward)    Start     Dose/Rate Route Frequency Ordered Stop   10/23/21 1400  metroNIDAZOLE (FLAGYL) tablet 500 mg        500 mg Oral Every 8 hours 10/23/21 0941 11/06/21 1359   10/23/21 1000  doxycycline (VIBRA-TABS) tablet 100 mg  Status:  Discontinued        100 mg Oral Every 12 hours 10/23/21 0801 10/23/21 0941   10/23/21 1000  doxycycline (VIBRA-TABS) tablet 100 mg        100 mg Oral Every 12 hours 10/23/21 0941 11/06/21 0959   10/22/21 1000  clarithromycin (BIAXIN) tablet 500 mg  Status:  Discontinued         500 mg Oral Every 12 hours 10/22/21 0848 10/23/21 0801   10/22/21 0945  metroNIDAZOLE (FLAGYL) tablet 500 mg  Status:  Discontinued        500 mg Oral Every 8 hours 10/22/21 0848 10/23/21 0941   10/18/21 1400  piperacillin-tazobactam (ZOSYN) IVPB 3.375 g  Status:  Discontinued        3.375 g 12.5 mL/hr over 240 Minutes Intravenous Every 8 hours 10/18/21 1328 10/23/21 0941   10/18/21 0700  piperacillin-tazobactam (ZOSYN) IVPB 3.375 g        3.375 g 100 mL/hr over 30 Minutes Intravenous  Once 10/18/21 0646 10/18/21 0828        Assessment/Plan POD5 s/p ex-lap and graham patch repair of perforated gastric ulcer 10/18/21 Dr. Romana Juniper - advanced to soft diet this AM - H. Pylori positive - changed to quad therapy this AM, DC IV zosyn - drain is serous - likely remove prior to DC, continue to monitor output - having diarrhea, add fiber supplement this AM and monitor on soft diet - Cr up slightly from yesterday, although appears around baseline, continue gentle IVF and recheck in AM - likely home tomorrow if continuing to do well Hypokalemia - K 3.1 this AM, replace PO  FEN: soft diet, LR'@50'$  cc/h, PPI converted to PO, replace K VTE: eliquis restarted 5/29 ID: PO flagyl 5/29>> 14 days, PO doxycycline 5/30>> 30 days  Cardiac amyloidosis  Hx of complete heart block s/p PPM Paroxysmal A.fib CAD Chronic systolic heart failure/HFrEF Aortic stenosis - resumed most cardiac meds and cards will follow up OP CKD stage III - baseline appears around 1.5, monitor UOP  LOS: 5 days     Norm Parcel, [facility] Surgery 10/23/2021, 9:43 AM Please see Amion for pager number during day hours 7:00am-4:30pm

## 2021-10-23 NOTE — Progress Notes (Signed)
Physical Therapy Treatment Patient Details Name: David [name] MRN: [id number] DOB: [birth year] Today's Date: 10/23/2021   History of Present Illness [name] pleasant 81yo man with history of CHF (EF ~35%) from cardiac amyloidosis, HTN, DM, pacemaker as well as other rmedical issues listed below  who presents with acute abdominal pain which began early this morning and was more in the LUQ/ left lower chest. He could not get comfortable and noted pain worse with coughing, deep breathing, or laughing as well as moving. He tried a tylenol with minimal relief, but was able to sleep for another hour until the pain became too severe to manage. Patient is s/p exploratory laparotomy, omental pedicle flap Phillip Heal patch) repair of perforated prepyloric ulcer    PT Comments    Pt received supine and agreeable to session with good progress towards goals. Pt demonstrating ambulation at supervision level with RW, progressing to no AD throughout session. Pt able to ascend/descend 7 steps (3+4) in stairwell with no LOB and alternating step-over-step pattern. Pt and spouse educated re; activity recommendations, pacing and energy conservation with pt and spouse verbalizing understanding. Pt continues to benefit from skilled PT services to progress toward functional mobility goals.     Recommendations for follow up therapy are one component of a multi-disciplinary discharge planning process, led by the attending physician.  Recommendations may be updated based on patient status, additional functional criteria and insurance authorization.  Follow Up Recommendations  No PT follow up     Assistance Recommended at Discharge Intermittent Supervision/Assistance  Patient can return home with the following A little help with walking and/or transfers;A little help with bathing/dressing/bathroom   Equipment Recommendations  None recommended by PT (patient has walker if needed)    Recommendations for Other Services        Precautions / Restrictions Precautions Precautions: Fall Precaution Comments: drain x1 Restrictions Weight Bearing Restrictions: No     Mobility  Bed Mobility Overal bed mobility: Independent                  Transfers Overall transfer level: Independent Equipment used: None                    Ambulation/Gait Ambulation/Gait assistance: Supervision Gait Distance (Feet): 550 Feet Assistive device: Rolling walker (2 wheels), None Gait Pattern/deviations: Step-through pattern Gait velocity: normal     General Gait Details: no lob with mobility, progressing gait to no AD   Stairs Stairs: Yes Stairs assistance: Min guard Stair Management: One rail Right, One rail Left, Alternating pattern, Forwards Number of Stairs: 7 (4+3) General stair comments: limited by IV length, no LOB, min gaurd for safety   Wheelchair Mobility    Modified Rankin (Stroke Patients Only)       Balance Overall balance assessment: Modified Independent                                          Cognition Arousal/Alertness: Awake/alert Behavior During Therapy: WFL for tasks assessed/performed Overall Cognitive Status: Within Functional Limits for tasks assessed                                          Exercises      General Comments        Pertinent Vitals/Pain Pain Assessment  Pain Assessment: Faces Faces Pain Scale: Hurts a little bit Pain Location: abdomen Pain Descriptors / Indicators: Discomfort, Sore Pain Intervention(s): Limited activity within patient's tolerance, Monitored during session    Home Living                          Prior Function            PT Goals (current goals can now be found in the care plan section) Acute Rehab PT Goals Patient Stated Goal: to get better, go home PT Goal Formulation: With patient Time For Goal Achievement: 11/02/21    Frequency           PT Plan       Co-evaluation              AM-PAC PT "6 Clicks" Mobility   Outcome Measure  Help needed turning from your back to your side while in a flat bed without using bedrails?: None Help needed moving from lying on your back to sitting on the side of a flat bed without using bedrails?: None Help needed moving to and from a bed to a chair (including a wheelchair)?: A Little Help needed standing up from a chair using your arms (e.g., wheelchair or bedside chair)?: None Help needed to walk in hospital room?: A Little Help needed climbing 3-5 steps with a railing? : A Little 6 Click Score: 21    End of Session Equipment Utilized During Treatment: Gait belt Activity Tolerance: Patient tolerated treatment well Patient left: in bed;with call bell/phone within reach;with family/visitor present Nurse Communication: Mobility status PT Visit Diagnosis: Muscle weakness (generalized) (M62.81);Difficulty in walking, not elsewhere classified (R26.2)     Time: 6203-5597 PT Time Calculation (min) (ACUTE ONLY): 29 min  Charges:  $Gait Training: 8-22 mins $Therapeutic Activity: 8-22 mins                     Antolin Belsito R. PTA Acute Rehabilitation Services Office: [place] 10/23/2021, 9:56 AM

## 2021-10-23 NOTE — Discharge Summary (Signed)
[facility] Discharge Summary   Patient ID: David [name] MRN: [id number] DOB/AGE: March 26, 1941 81 y.o.  Admit date: 10/18/2021 Discharge date: 10/24/2021  Admitting Diagnosis: Cardiac amyloidosis CHF PAF HTN CKD stage III Perforated viscus   Discharge Diagnosis Cardiac amyloidosis CHF PAF HTN CKD stage III Perforated gastric ulcer s/p repair  H. Pylori infection   Consultants Cardiology   Imaging: No results found.  Procedures Dr. Romana Juniper (10/18/21) - Exploratory laparotomy, graham patch repair of perforated gastric ulcer  Hospital Course:  Patient is an 81 year old male who presented to the ED with abdominal pain.  Workup showed pneumoperitoneum with concern for perforated gastric ulcer.  Patient was admitted and underwent procedure listed above.  Tolerated procedure well and was transferred to the floor.  Diet was advanced as tolerated. Patient was found to be H.pylori positive and started on appropriate therapy for this once able to take PO medications. Cardiology was consulted for assistance with medication management in setting of significant cardiac history s/p PPM and hx of cardiac amyloidosis. On POD6, the patient was voiding well, tolerating diet, ambulating well, pain well controlled, vital signs stable, incisions c/d/i and felt stable for discharge home.  Patient will follow up in our office in 1 week and knows to call with questions or concerns.  He will call to confirm appointment date/time.    I or a member of my team have reviewed this patient in the Controlled Substance Database.   Allergies as of 10/24/2021       Reactions   Benadryl [diphenhydramine Hcl] Other (See Comments)   Adverse reaction, has very strange behavior. Also hyperactive.   Lisinopril Other (See Comments)   hypotensive   Restoril [temazepam] Other (See Comments)   Hyperactive        Medication List     TAKE these medications    acetaminophen 325 MG  tablet Commonly known as: TYLENOL Take 2 tablets (650 mg total) by mouth every 6 (six) hours as needed for mild pain or fever. What changed:  medication strength how much to take when to take this reasons to take this   apixaban 2.5 MG Tabs tablet Commonly known as: Eliquis Take 1 tablet (2.5 mg total) by mouth 2 (two) times daily.   ascorbic acid 500 MG tablet Commonly known as: VITAMIN C Take 500 mg by mouth daily. Pt states taking 3/week   bismuth subsalicylate 209 OB/09GG suspension Commonly known as: PEPTO BISMOL Take 30 mLs by mouth 4 (four) times daily -  before meals and at bedtime for 14 days.   doxycycline 100 MG tablet Commonly known as: VIBRA-TABS Take 1 tablet (100 mg total) by mouth every 12 (twelve) hours for 13 days.   Farxiga 10 MG Tabs tablet Generic drug: dapagliflozin propanediol TAKE 1 TABLET(10 MG) BY MOUTH DAILY BEFORE BREAKFAST What changed: See the new instructions.   fluticasone 50 MCG/ACT nasal spray Commonly known as: FLONASE Place 1 spray into both nostrils daily as needed for allergies or rhinitis.   loperamide 2 MG capsule Commonly known as: IMODIUM Take 1 capsule (2 mg total) by mouth 2 (two) times daily as needed for diarrhea or loose stools.   loratadine 10 MG tablet Commonly known as: CLARITIN Take 10 mg by mouth daily as needed for allergies.   metroNIDAZOLE 500 MG tablet Commonly known as: FLAGYL Take 1 tablet (500 mg total) by mouth every 8 (eight) hours for 12 days.   nitroGLYCERIN 0.4 MG SL tablet Commonly known as: NITROSTAT  Place 1 tablet (0.4 mg total) under the tongue every 5 (five) minutes as needed. For chest pain   NON FORMULARY BIPAP at bedtime   pantoprazole 40 MG tablet Commonly known as: PROTONIX Take 1 tablet (40 mg total) by mouth 2 (two) times daily.   polycarbophil 625 MG tablet Commonly known as: FIBERCON Take 1 tablet (625 mg total) by mouth daily. Start taking on: October 25, 2021   potassium chloride  10 MEQ tablet Commonly known as: KLOR-CON M Take 10 mEq by mouth 2 (two) times daily.   pramipexole 1 MG tablet Commonly known as: MIRAPEX Take 2 mg by mouth at bedtime.   PreserVision AREDS 2 Caps Take 1 capsule by mouth daily.   REFRESH OP Place 1 drop into both eyes daily as needed (dry eyes).   saccharomyces boulardii 250 MG capsule Commonly known as: FLORASTOR Take 1 capsule (250 mg total) by mouth 2 (two) times daily for 14 days.   sildenafil 50 MG tablet Commonly known as: VIAGRA Take 25-50 mg by mouth daily as needed for erectile dysfunction.   simvastatin 10 MG tablet Commonly known as: ZOCOR Take 10 mg by mouth every evening.   spironolactone 25 MG tablet Commonly known as: ALDACTONE Take 0.5 tablets (12.5 mg total) by mouth at bedtime. What changed: how much to take   [place] 4 % Crea Generic drug: Fluorouracil Apply 1 application topically at bedtime. What changed:  when to take this reasons to take this   torsemide 20 MG tablet Commonly known as: DEMADEX Take 1.5 tablets (30 mg total) by mouth daily. What changed:  how much to take when to take this additional instructions   traMADol 50 MG tablet Commonly known as: ULTRAM Take 1 tablet (50 mg total) by mouth every 6 (six) hours as needed for moderate pain or severe pain.   triamcinolone cream 0.1 % Commonly known as: KENALOG Apply 1 application topically 2 (two) times daily as needed (itching).   Vitamin B-12 2500 MCG Subl Place 2,500 mcg under the tongue daily.   Vitamin D 50 MCG (2000 UT) tablet Take 2,000 Units by mouth daily.   Vyndamax 61 MG Caps Generic drug: Tafamidis TAKE 1 CAPSULE (61 MG) DAILY What changed: See the new instructions.          Follow-up Information     Clovis Riley, MD. Go on 11/01/2021.   Specialty: General Surgery Why: 6/8 at 9:50 AM. Please arrive 30 min prior to appointment time to check in and have ID and insurance card with you. Contact  information: [address] [place] [address] [phone]         Jettie Booze, MD. Schedule an appointment as soon as possible for a visit.   Specialties: Cardiology, Radiology, Interventional Cardiology Why: As needed if further cardiac concerns prior to return to [place]. Contact information: [address] [place] [address] [phone]                 Signed: Norm Parcel , [facility] Surgery 10/24/2021, 3:50 PM Please see Amion for pager number during day hours 7:00am-4:30pm

## 2021-10-23 NOTE — Plan of Care (Signed)
  Problem: Education: Goal: Knowledge of General Education information will improve Description Including pain rating scale, medication(s)/side effects and non-pharmacologic comfort measures Outcome: Progressing   Problem: Health Behavior/Discharge Planning: Goal: Ability to manage health-related needs will improve Outcome: Progressing   

## 2021-10-24 ENCOUNTER — Other Ambulatory Visit (HOSPITAL_COMMUNITY): Payer: Self-pay

## 2021-10-24 LAB — CBC
HCT: 29.3 % — ABNORMAL LOW (ref 39.0–52.0)
Hemoglobin: 9.3 g/dL — ABNORMAL LOW (ref 13.0–17.0)
MCH: 31.1 pg (ref 26.0–34.0)
MCHC: 31.7 g/dL (ref 30.0–36.0)
MCV: 98 fL (ref 80.0–100.0)
Platelets: 205 10*3/uL (ref 150–400)
RBC: 2.99 MIL/uL — ABNORMAL LOW (ref 4.22–5.81)
RDW: 14.6 % (ref 11.5–15.5)
WBC: 11.6 10*3/uL — ABNORMAL HIGH (ref 4.0–10.5)
nRBC: 0 % (ref 0.0–0.2)

## 2021-10-24 LAB — BASIC METABOLIC PANEL
Anion gap: 5 (ref 5–15)
BUN: 13 mg/dL (ref 8–23)
CO2: 22 mmol/L (ref 22–32)
Calcium: 8.1 mg/dL — ABNORMAL LOW (ref 8.9–10.3)
Chloride: 110 mmol/L (ref 98–111)
Creatinine, Ser: 1.57 mg/dL — ABNORMAL HIGH (ref 0.61–1.24)
GFR, Estimated: 43 mL/min — ABNORMAL LOW (ref >60)
Glucose, Bld: 145 mg/dL — ABNORMAL HIGH (ref 70–99)
Potassium: 4.1 mmol/L (ref 3.5–5.1)
Sodium: 137 mmol/L (ref 135–145)

## 2021-10-24 MED ORDER — ACETAMINOPHEN 325 MG PO TABS: 650.0000 mg | ORAL_TABLET | Freq: Four times a day (QID) | ORAL | Status: AC | PRN

## 2021-10-24 MED ORDER — SODIUM CHLORIDE 0.9% FLUSH
3.0000 mL | Freq: Two times a day (BID) | INTRAVENOUS | Status: DC
  Administered 2021-10-24: 3 mL via INTRAVENOUS

## 2021-10-24 MED ORDER — LOPERAMIDE HCL 2 MG PO CAPS: 2.0000 mg | ORAL_CAPSULE | Freq: Two times a day (BID) | ORAL | Status: AC | PRN

## 2021-10-24 MED ORDER — CALCIUM POLYCARBOPHIL 625 MG PO TABS
625.0000 mg | ORAL_TABLET | Freq: Every day | ORAL | 0 refills | Status: AC
  Filled 2021-10-24: qty 30, 30d supply, fill #0

## 2021-10-24 MED ORDER — SODIUM CHLORIDE 0.9 % IV SOLN: 250.0000 mL | INTRAVENOUS | Status: DC | PRN

## 2021-10-24 MED ORDER — SODIUM CHLORIDE 0.9 % IV BOLUS
500.0000 mL | Freq: Once | INTRAVENOUS | Status: AC
Start: 2021-10-24 — End: 2021-10-24
  Administered 2021-10-24: 500 mL via INTRAVENOUS

## 2021-10-24 MED ORDER — METRONIDAZOLE 500 MG PO TABS
500.0000 mg | ORAL_TABLET | Freq: Three times a day (TID) | ORAL | 0 refills | Status: AC
  Filled 2021-10-24: qty 36, 12d supply, fill #0

## 2021-10-24 MED ORDER — BISMUTH SUBSALICYLATE 262 MG/15ML PO SUSP
30.0000 mL | Freq: Three times a day (TID) | ORAL | 0 refills | Status: AC
  Filled 2021-10-24: qty 1680, 14d supply, fill #0

## 2021-10-24 MED ORDER — SACCHAROMYCES BOULARDII 250 MG PO CAPS
250.0000 mg | ORAL_CAPSULE | Freq: Two times a day (BID) | ORAL | 0 refills | Status: AC
  Filled 2021-10-24: qty 28, 14d supply, fill #0

## 2021-10-24 MED ORDER — SODIUM CHLORIDE 0.9% FLUSH: 3.0000 mL | INTRAVENOUS | Status: DC | PRN

## 2021-10-24 MED ORDER — PANTOPRAZOLE SODIUM 40 MG PO TBEC
40.0000 mg | DELAYED_RELEASE_TABLET | Freq: Two times a day (BID) | ORAL | 0 refills | Status: AC
  Filled 2021-10-24: qty 60, 30d supply, fill #0

## 2021-10-24 MED ORDER — DOXYCYCLINE HYCLATE 100 MG PO TABS
100.0000 mg | ORAL_TABLET | Freq: Two times a day (BID) | ORAL | 0 refills | Status: AC
  Filled 2021-10-24: qty 26, 13d supply, fill #0

## 2021-10-24 MED ORDER — SACCHAROMYCES BOULARDII 250 MG PO CAPS
250.0000 mg | ORAL_CAPSULE | Freq: Two times a day (BID) | ORAL | Status: DC
  Administered 2021-10-24: 250 mg via ORAL
  Filled 2021-10-24: qty 1

## 2021-10-24 MED ORDER — LOPERAMIDE HCL 2 MG PO CAPS
2.0000 mg | ORAL_CAPSULE | Freq: Two times a day (BID) | ORAL | Status: DC | PRN
  Administered 2021-10-24: 2 mg via ORAL
  Filled 2021-10-24: qty 1

## 2021-10-24 MED ORDER — TRAMADOL HCL 50 MG PO TABS
50.0000 mg | ORAL_TABLET | Freq: Four times a day (QID) | ORAL | 0 refills | Status: AC | PRN
  Filled 2021-10-24: qty 20, 5d supply, fill #0

## 2021-10-24 NOTE — Progress Notes (Signed)
Mobility Specialist Progress Note:   10/24/21 1545  Mobility  Activity Ambulated with assistance in hallway  Level of Assistance Contact guard assist, steadying assist  Assistive Device None  Distance Ambulated (ft) 500 ft  Activity Response Tolerated well  $Mobility charge 1 Mobility   Pt eager for second mobility session. Ambulated with steadying assist. No overt LOB or unsteadiness. Pt eager for d/c, back in chair.   Nelta Numbers Acute Rehab Secure Chat or Office Phone: [phone]

## 2021-10-24 NOTE — Progress Notes (Signed)
Physical Therapy Treatment Patient Details Name: David [name] MRN: [id number] DOB: May 13, 1941 Today's Date: 10/24/2021   History of Present Illness David [name] pleasant 81yo man with history of CHF (EF ~35%) from cardiac amyloidosis, HTN, DM, pacemaker as well as other rmedical issues listed below  who presents with acute abdominal pain which began early this morning and was more in the LUQ/ left lower chest. He could not get comfortable and noted pain worse with coughing, deep breathing, or laughing as well as moving. He tried a tylenol with minimal relief, but was able to sleep for another hour until the pain became too severe to manage. Patient is s/p exploratory laparotomy, omental pedicle flap Phillip Heal patch) repair of perforated prepyloric ulcer    PT Comments    Pt received supine and agreeable to session with continued progress towards goals. Pt able to demonstrate increased ambulation tolerance without AD this session at supervision level with standing rest x3 required secondary to fatigue. Pt with mild instability throughout but no LOB. Pt educated re; pacing, activity recommendations, increasing activity tolerance slowly and HEP compliance, pt verbalizing understanding. Pt provided with LE HEP and able to demo back all exercises with good tolerance. Pt continues to benefit from skilled PT services to progress toward functional mobility goals.    Recommendations for follow up therapy are one component of a multi-disciplinary discharge planning process, led by the attending physician.  Recommendations may be updated based on patient status, additional functional criteria and insurance authorization.  Follow Up Recommendations  No PT follow up     Assistance Recommended at Discharge Intermittent Supervision/Assistance  Patient can return home with the following A little help with walking and/or transfers;A little help with bathing/dressing/bathroom   Equipment Recommendations  None recommended  by PT (patient has walker if needed)    Recommendations for Other Services       Precautions / Restrictions Precautions Precautions: Fall Restrictions Weight Bearing Restrictions: No     Mobility  Bed Mobility Overal bed mobility: Independent                  Transfers Overall transfer level: Independent Equipment used: None                    Ambulation/Gait Ambulation/Gait assistance: Supervision Gait Distance (Feet): 520 Feet Assistive device: None Gait Pattern/deviations: Step-through pattern Gait velocity: normal     General Gait Details: mild general instabiltiy, standing rest x3   Stairs             Wheelchair Mobility    Modified Rankin (Stroke Patients Only)       Balance Overall balance assessment: Modified Independent                                          Cognition Arousal/Alertness: Awake/alert Behavior During Therapy: WFL for tasks assessed/performed Overall Cognitive Status: Within Functional Limits for tasks assessed                                          Exercises General Exercises - Lower Extremity Hip ABduction/ADduction: Both, 20 reps, Standing Hip Flexion/Marching: Both, 20 reps, Standing Heel Raises: Both, 20 reps, Standing Mini-Sqauts: 20 reps, Standing    General Comments        Pertinent Vitals/Pain  Pain Assessment Pain Assessment: Faces Faces Pain Scale: Hurts a little bit Pain Location: abdomen Pain Descriptors / Indicators: Discomfort, Sore Pain Intervention(s): Limited activity within patient's tolerance, Monitored during session    Home Living                          Prior Function            PT Goals (current goals can now be found in the care plan section) Acute Rehab PT Goals Patient Stated Goal: to get better, go home PT Goal Formulation: With patient Time For Goal Achievement: 11/02/21    Frequency           PT Plan       Co-evaluation              AM-PAC PT "6 Clicks" Mobility   Outcome Measure  Help needed turning from your back to your side while in a flat bed without using bedrails?: None Help needed moving from lying on your back to sitting on the side of a flat bed without using bedrails?: None Help needed moving to and from a bed to a chair (including a wheelchair)?: A Little Help needed standing up from a chair using your arms (e.g., wheelchair or bedside chair)?: None Help needed to walk in hospital room?: A Little Help needed climbing 3-5 steps with a railing? : A Little 6 Click Score: 21    End of Session Equipment Utilized During Treatment: Gait belt Activity Tolerance: Patient tolerated treatment well Patient left: in bed;with call bell/phone within reach;with family/visitor present Nurse Communication: Mobility status PT Visit Diagnosis: Muscle weakness (generalized) (M62.81);Difficulty in walking, not elsewhere classified (R26.2)     Time: 0962-8366 PT Time Calculation (min) (ACUTE ONLY): 32 min  Charges:  $Gait Training: 8-22 mins $Therapeutic Exercise: 8-22 mins                     Glover Capano R. PTA Acute Rehabilitation Services Office: [place] 10/24/2021, 9:53 AM

## 2021-10-24 NOTE — Progress Notes (Signed)
Progress Note  6 Days Post-Op  Subjective: Pt still having some diarrhea but reports it is improving slightly. Had some hypotension overnight as well. Tolerating diet and denies nausea or vomiting.   Objective: Vital signs in last 24 hours: Temp:  [98.1 F (36.7 C)-98.5 F (36.9 C)] 98.1 F (36.7 C) (05/31 0735) Pulse Rate:  [76-80] 80 (05/31 0735) Resp:  [16-18] 16 (05/31 0735) BP: (88-101)/(52-60) 88/57 (05/31 0735) SpO2:  [96 %-100 %] 100 % (05/31 0735) Last BM Date : 10/23/21  Intake/Output from previous day: 05/30 0701 - 05/31 0700 In: 540 [P.O.:540] Out: 205 [Drains:205] Intake/Output this shift: No intake/output data recorded.  PE: General: pleasant, WD, elderly male who is laying in bed in NAD Heart: regular, rate, and rhythm.   Lungs: Respiratory effort nonlabored Abd: soft, NT, ND, +BS, drain in RLQ with serous fluid - removed without complication, upper midline incision C/D/I with 7 staples present Psych: A&Ox3 with an appropriate affect.    Lab Results:  Recent Labs    10/22/21 0112 10/24/21 0243  WBC 8.0 11.6*  HGB 8.8* 9.3*  HCT 27.2* 29.3*  PLT 167 205   BMET Recent Labs    10/23/21 0824 10/24/21 0243  NA 139 137  K 3.1* 4.1  CL 109 110  CO2 22 22  GLUCOSE 126* 145*  BUN 14 13  CREATININE 1.52* 1.57*  CALCIUM 8.2* 8.1*   PT/INR No results for input(s): LABPROT, INR in the last 72 hours. CMP     Component Value Date/Time   NA 137 10/24/2021 0243   NA 143 04/26/2019 1545   K 4.1 10/24/2021 0243   CL 110 10/24/2021 0243   CO2 22 10/24/2021 0243   GLUCOSE 145 (H) 10/24/2021 0243   BUN 13 10/24/2021 0243   BUN 37 (H) 04/26/2019 1545   CREATININE 1.57 (H) 10/24/2021 0243   CREATININE 1.30 (H) 01/31/2016 1545   CALCIUM 8.1 (L) 10/24/2021 0243   CALCIUM 9.3 12/14/2019 1305   PROT 5.3 (L) 10/19/2021 0412   PROT 6.7 05/10/2019 1313   ALBUMIN 3.0 (L) 10/19/2021 0412   AST 22 10/19/2021 0412   ALT 14 10/19/2021 0412   ALKPHOS 52  10/19/2021 0412   BILITOT 2.2 (H) 10/19/2021 0412   GFRNONAA 43 (L) 10/24/2021 0243   GFRAA 47 (L) 01/05/2020 1135   Lipase     Component Value Date/Time   LIPASE 39 10/18/2021 0530       Studies/Results: No results found.  Anti-infectives: Anti-infectives (From admission, onward)    Start     Dose/Rate Route Frequency Ordered Stop   10/23/21 1100  doxycycline (VIBRA-TABS) tablet 100 mg        100 mg Oral Every 12 hours 10/23/21 0941 11/06/21 0959   10/23/21 1100  metroNIDAZOLE (FLAGYL) tablet 500 mg        500 mg Oral Every 8 hours 10/23/21 0941 11/06/21 1359   10/23/21 1000  doxycycline (VIBRA-TABS) tablet 100 mg  Status:  Discontinued        100 mg Oral Every 12 hours 10/23/21 0801 10/23/21 0941   10/22/21 1000  clarithromycin (BIAXIN) tablet 500 mg  Status:  Discontinued        500 mg Oral Every 12 hours 10/22/21 0848 10/23/21 0801   10/22/21 0945  metroNIDAZOLE (FLAGYL) tablet 500 mg  Status:  Discontinued        500 mg Oral Every 8 hours 10/22/21 0848 10/23/21 0941   10/18/21 1400  piperacillin-tazobactam (ZOSYN) IVPB  3.375 g  Status:  Discontinued        3.375 g 12.5 mL/hr over 240 Minutes Intravenous Every 8 hours 10/18/21 1328 10/23/21 0941   10/18/21 0700  piperacillin-tazobactam (ZOSYN) IVPB 3.375 g        3.375 g 100 mL/hr over 30 Minutes Intravenous  Once 10/18/21 0646 10/18/21 0828        Assessment/Plan POD6 s/p ex-lap and graham patch repair of perforated gastric ulcer 10/18/21 Dr. Romana Juniper - tolerating soft diet - H. Pylori positive - quad therapy initiated - drain is serous - removed this AM without complication  - having diarrhea - add probiotic and ok to have low dose imodium BID PRN - Cr stable and appears around baseline  - possibly home later today vs tomorrow, will re-evaluate this afternoon  Hypokalemia - K 4.1 this AM, resolved   FEN: soft diet, 500 cc bolus and then SLIV VTE: eliquis restarted 5/29 and hgb stable  ID: PO flagyl  5/29>> 14 days, PO doxycycline 5/30>> 30 days   Cardiac amyloidosis  Hx of complete heart block s/p PPM Paroxysmal A.fib CAD Chronic systolic heart failure/HFrEF Aortic stenosis - resumed most cardiac meds and cards will follow up OP CKD stage III - baseline appears around 1.5, monitor UOP  LOS: 6 days      Norm Parcel, [facility] Surgery 10/24/2021, 8:46 AM Please see Amion for pager number during day hours 7:00am-4:30pm

## 2021-10-24 NOTE — Progress Notes (Signed)
Mobility Specialist Progress Note:   10/24/21 1100  Mobility  Activity Ambulated with assistance to bathroom  Level of Assistance Contact guard assist, steadying assist  Assistive Device None  Distance Ambulated (ft) 15 ft  Activity Response Tolerated well  $Mobility charge 1 Mobility   Pt requesting to go to [facility]. Required CGA d/t no AD use. Pt left, educated to pull NSG when done. Wife present.   Nelta Numbers [facility] Secure Chat or Office Phone: [phone]

## 2021-10-24 NOTE — Progress Notes (Signed)
Mobility Specialist Progress Note:   10/24/21 1120  Mobility  Activity Ambulated with assistance in hallway  Level of Assistance Contact guard assist, steadying assist  Assistive Device Other (Comment) (IV Pole)  Distance Ambulated (ft) 550 ft  Activity Response Tolerated well  $Mobility charge 1 Mobility   Pt eager for mobility session. Required minG throughout ambulation with IV pole. Pt with steady gait throughout. Back in chair with all needs met.   Nelta Numbers [facility] Secure Chat or Office Phone: [phone]

## 2021-11-05 ENCOUNTER — Other Ambulatory Visit (HOSPITAL_COMMUNITY): Payer: Self-pay

## 2022-04-08 ENCOUNTER — Telehealth (HOSPITAL_COMMUNITY): Payer: Self-pay | Admitting: Pharmacy Technician

## 2022-04-08 NOTE — Telephone Encounter (Signed)
Patient Advocate Encounter   Received notification from [employer] that prior authorization for Vyndamax is required.   PA submitted on CoverMyMeds Key [id number] Status is pending   Will continue to follow.

## 2022-04-08 NOTE — Telephone Encounter (Signed)
Advanced Heart Failure Patient Advocate Encounter  Prior Authorization for David [name] has been approved.    PA# [id number] Effective dates: 03/09/22 through 04/08/23  Charlann Boxer, CPhT

## 2022-05-27 ENCOUNTER — Other Ambulatory Visit (HOSPITAL_COMMUNITY): Payer: Self-pay | Admitting: Cardiology

## 2022-06-11 ENCOUNTER — Other Ambulatory Visit (HOSPITAL_COMMUNITY): Payer: Self-pay

## 2022-10-08 ENCOUNTER — Other Ambulatory Visit (HOSPITAL_COMMUNITY): Payer: Self-pay | Admitting: Cardiology

## 2023-03-11 ENCOUNTER — Telehealth (HOSPITAL_COMMUNITY): Payer: Self-pay | Admitting: Pharmacy Technician

## 2023-03-11 NOTE — Telephone Encounter (Signed)
Patient Advocate Encounter   Received notification from [employer] that prior authorization for Vyndamax is required.   PA submitted on CoverMyMeds Key [id number] Status is pending   Will continue to follow.

## 2023-03-11 NOTE — Telephone Encounter (Signed)
Advanced Heart Failure Patient Advocate Encounter  Prior Authorization for David [name] has been approved.    PA# [id number] Effective dates: 02/09/23 through 03/10/24  Archer Asa, CPhT

## 2023-05-23 IMAGING — RF DG UGI W SINGLE CM
14 of 15 series · 14 of 15 positions shown · IV contrast (omnipaque)
Comparison: None Available.

CLINICAL DATA: 3 days status [place] repair of perforated
pre-pyloric gastric ulcer. Evaluate for postop leak.

EXAM:
WATER SOLUBLE UPPER GI SERIES
TECHNIQUE: Single-column upper GI series was performed using Omnipaque 300
water soluble contrast.
Radiation Exposure Index (as provided by the fluoroscopic device):
31.4 mGy Kerma
CONTRAST:  200mL OMNIPAQUE IOHEXOL 300 MG/ML  SOLN

[Series 1: cp_standard · 0.26mm/px · 1 of 1 slices shown (1 of 14)]
[im 1/1]
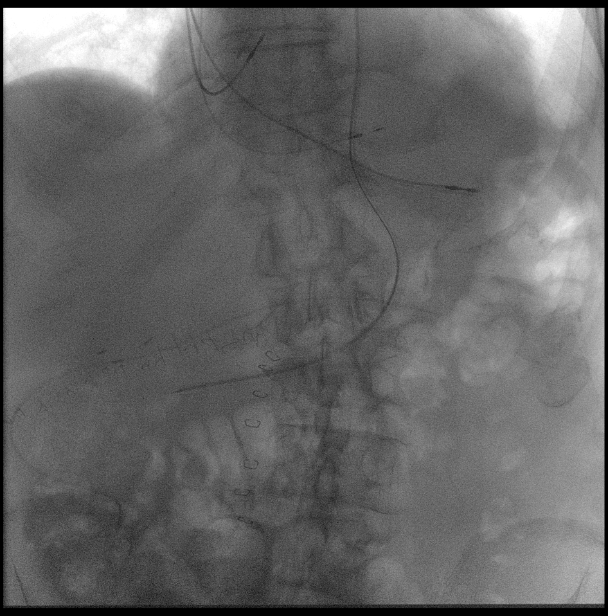

[Series 2: cp_standard · 0.26mm/px · 1 of 1 slices shown (2 of 14)]
[im 1/1]
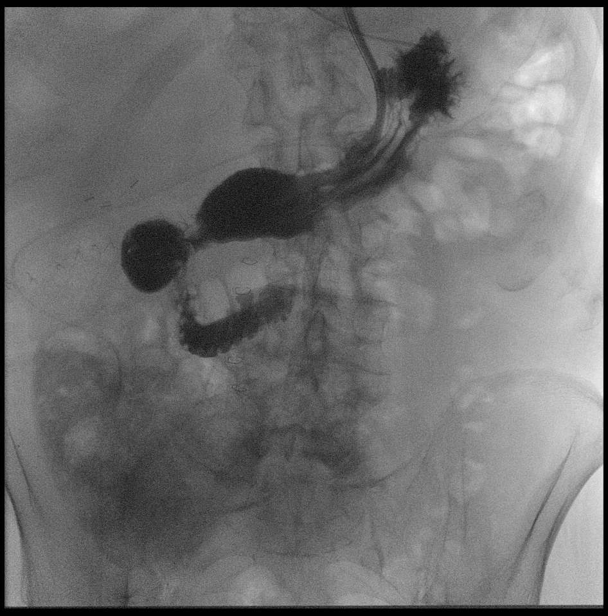

[Series 3: cp_standard · 0.26mm/px · 1 of 1 slices shown (3 of 14)]
[im 1/1]
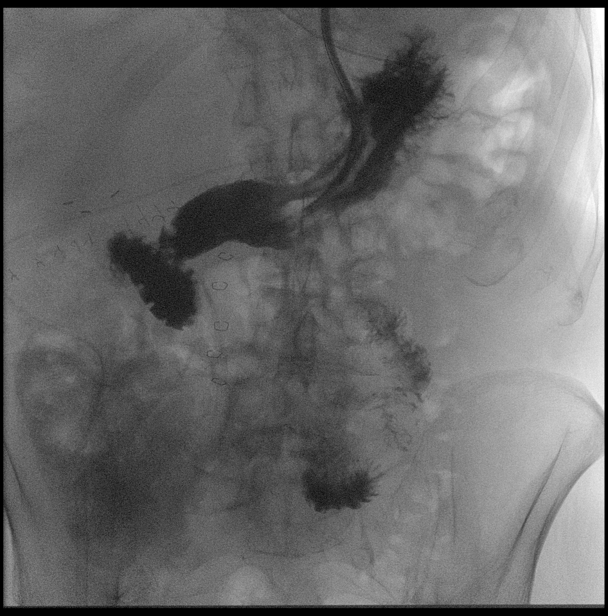

[Series 4: cp_standard · 0.18mm/px · 1 of 1 slices shown (4 of 14)]
[im 1/1]
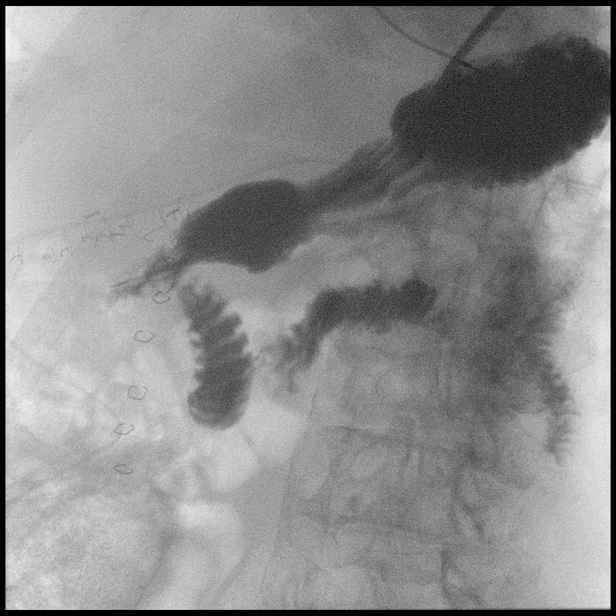

[Series 5: cp_standard · 0.19mm/px · 1 of 1 slices shown (5 of 14)]
[im 1/1]
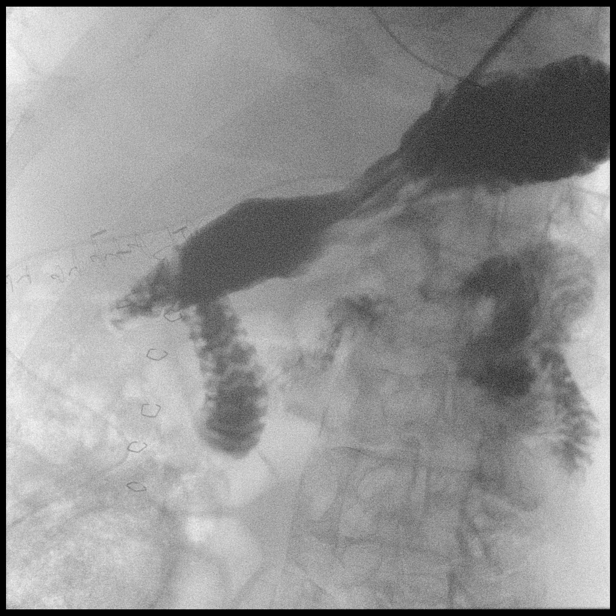

[Series 6: cp_standard · 0.19mm/px · 1 of 1 slices shown (6 of 14)]
[im 1/1]
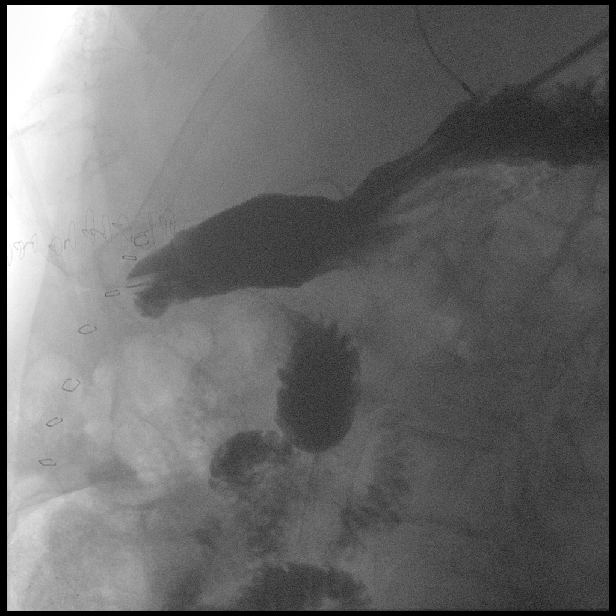

[Series 7: cp_standard · 0.19mm/px · 1 of 1 slices shown (7 of 14)]
[im 1/1]
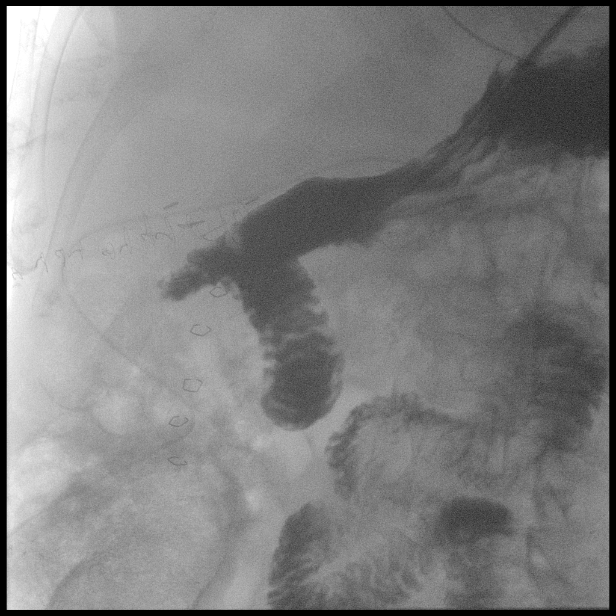

[Series 9: cp_standard · 0.19mm/px · 1 of 1 slices shown (8 of 14)]
[im 1/1]
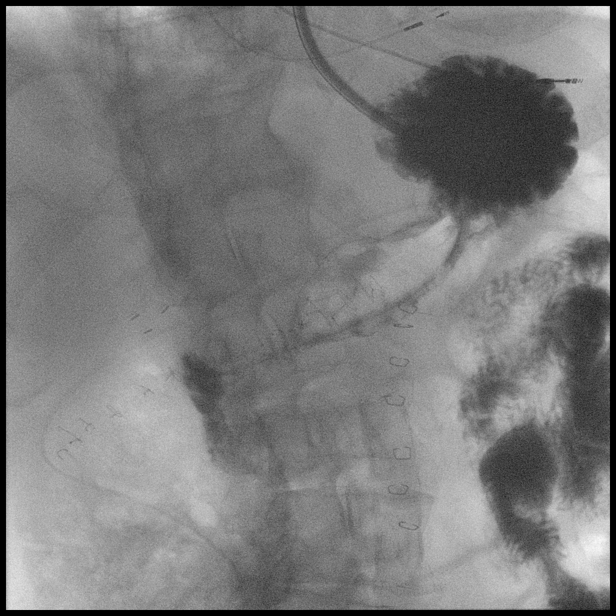

[Series 10: cp_standard · 0.19mm/px · 1 of 1 slices shown (9 of 14)]
[im 1/1]
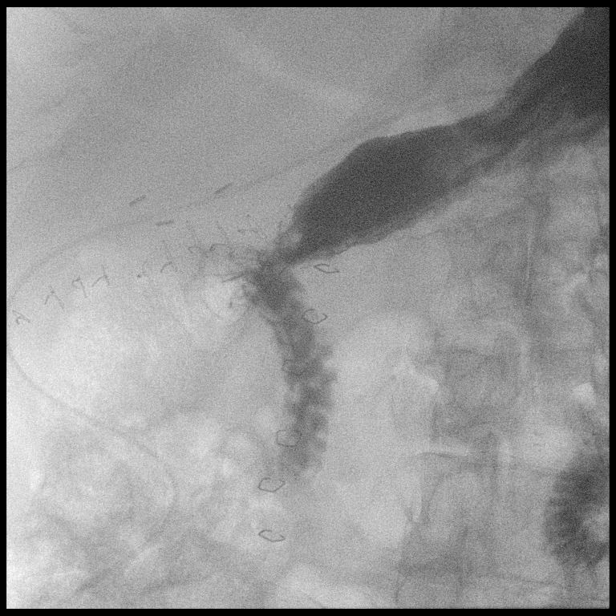

[Series 11: cp_standard · 0.19mm/px · 1 of 1 slices shown (10 of 14)]
[im 1/1]
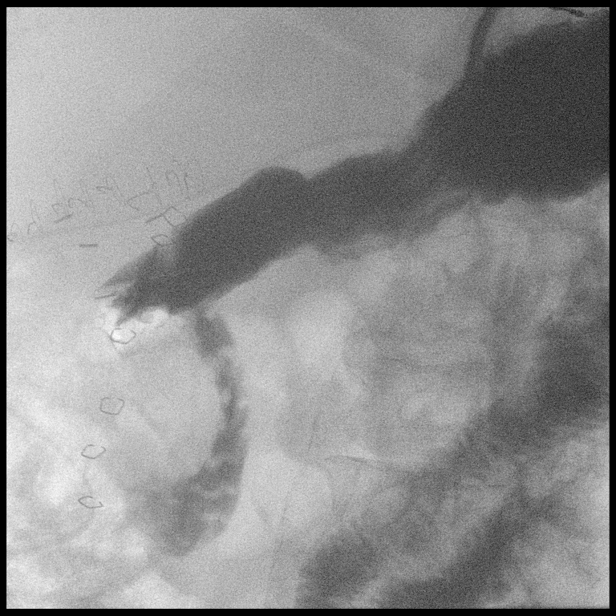

[Series 12: cp_standard · 0.19mm/px · 1 of 1 slices shown (11 of 14)]
[im 1/1]
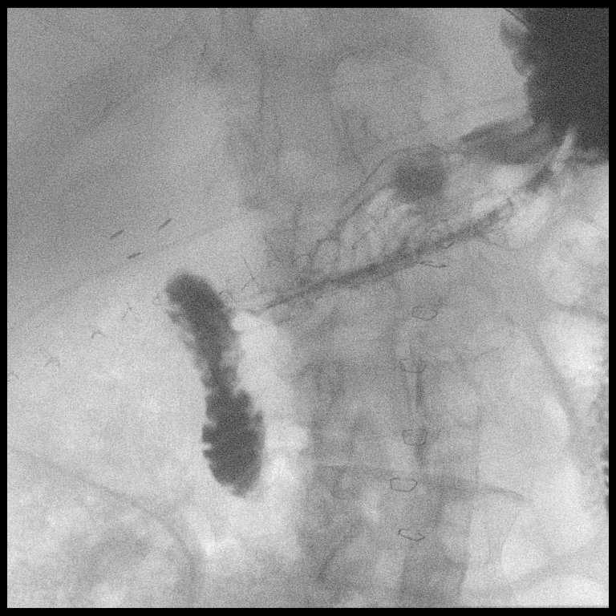

[Series 13: cp_standard · 0.19mm/px · 1 of 1 slices shown (12 of 14)]
[im 1/1]
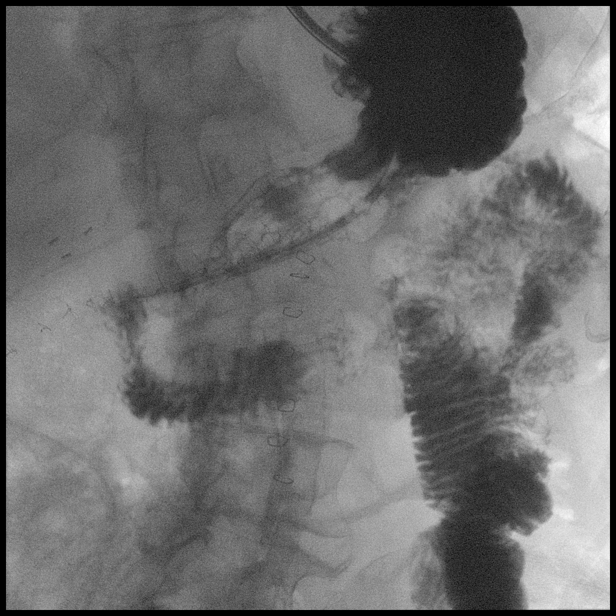

[Series 14: cp_standard · 0.19mm/px · 1 of 1 slices shown (13 of 14)]
[im 1/1]
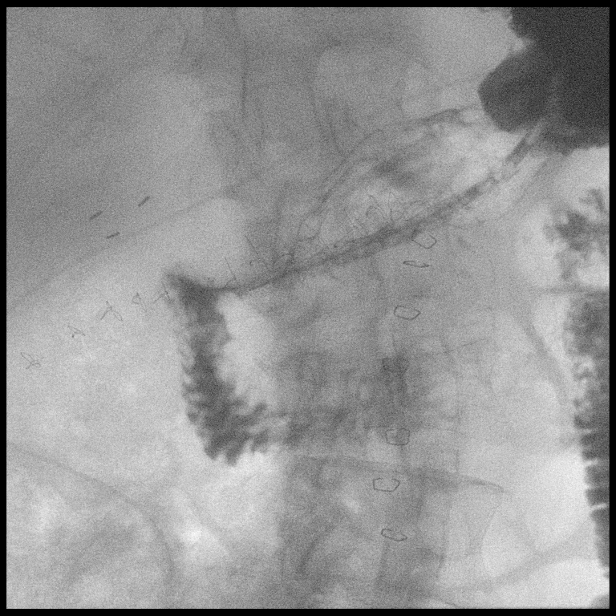

[Series 15: cp_standard · 0.29mm/px · 1 of 1 slices shown (14 of 14)]
[im 1/1]
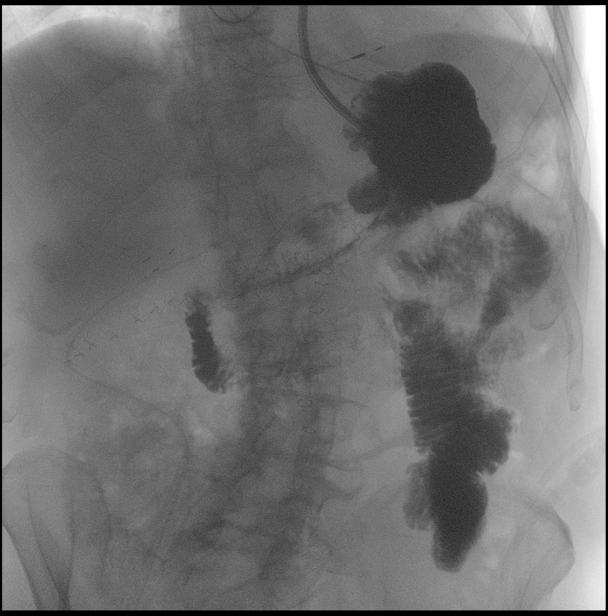

[14 of 15 positions shown; findings below may reference images not displayed]

FINDINGS: Water-soluble contrast was administered via the patient's existing
nasogastric tube. The stomach is unremarkable appearance for the
degree of distention. Prompt gastric emptying of contrast is seen.
The duodenum has a normal appearance and course, with ligament of
Treitz in the left upper quadrant. There is no evidence of contrast
leak or extravasation from the stomach or duodenum.
IMPRESSION: Negative exam. No evidence of contrast leak from the stomach or
duodenum. No evidence of gastroduodenal obstruction.

## 2024-02-13 ENCOUNTER — Other Ambulatory Visit (HOSPITAL_COMMUNITY): Payer: Self-pay

## 2024-06-27 DEATH — deceased
